# Patient Record
Sex: Male | Born: 1960 | Race: White | Hispanic: No | Marital: Married | Smoking: Former smoker
Health system: Southern US, Community
[De-identification: ages and names within clinical notes are randomized; demographics above are authoritative.]

## PROBLEM LIST (undated history)

## (undated) DIAGNOSIS — F200 Paranoid schizophrenia: Secondary | ICD-10-CM

## (undated) DIAGNOSIS — M1990 Unspecified osteoarthritis, unspecified site: Secondary | ICD-10-CM

## (undated) DIAGNOSIS — G4730 Sleep apnea, unspecified: Secondary | ICD-10-CM

## (undated) DIAGNOSIS — F1021 Alcohol dependence, in remission: Secondary | ICD-10-CM

## (undated) DIAGNOSIS — F32A Depression, unspecified: Secondary | ICD-10-CM

## (undated) DIAGNOSIS — S72401A Unspecified fracture of lower end of right femur, initial encounter for closed fracture: Secondary | ICD-10-CM

## (undated) DIAGNOSIS — F419 Anxiety disorder, unspecified: Secondary | ICD-10-CM

## (undated) DIAGNOSIS — N400 Enlarged prostate without lower urinary tract symptoms: Secondary | ICD-10-CM

## (undated) DIAGNOSIS — F329 Major depressive disorder, single episode, unspecified: Secondary | ICD-10-CM

## (undated) DIAGNOSIS — B1920 Unspecified viral hepatitis C without hepatic coma: Secondary | ICD-10-CM

## (undated) DIAGNOSIS — G8929 Other chronic pain: Secondary | ICD-10-CM

## (undated) HISTORY — DX: Sleep apnea, unspecified: G47.30

## (undated) HISTORY — DX: Depression, unspecified: F32.A

## (undated) HISTORY — DX: Paranoid schizophrenia: F20.0

## (undated) HISTORY — DX: Anxiety disorder, unspecified: F41.9

## (undated) HISTORY — PX: JOINT REPLACEMENT: SHX530

## (undated) HISTORY — PX: FRACTURE SURGERY: SHX138

## (undated) HISTORY — DX: Unspecified osteoarthritis, unspecified site: M19.90

## (undated) HISTORY — DX: Unspecified viral hepatitis C without hepatic coma: B19.20

---
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
## 2005-07-17 ENCOUNTER — Emergency Department: Payer: Self-pay | Admitting: Emergency Medicine

## 2009-12-15 ENCOUNTER — Inpatient Hospital Stay: Payer: Self-pay | Admitting: Internal Medicine

## 2010-03-29 ENCOUNTER — Emergency Department: Payer: Self-pay | Admitting: Emergency Medicine

## 2010-08-13 ENCOUNTER — Inpatient Hospital Stay: Payer: Self-pay | Admitting: Unknown Physician Specialty

## 2018-06-09 ENCOUNTER — Other Ambulatory Visit: Payer: Self-pay

## 2018-06-09 ENCOUNTER — Encounter (HOSPITAL_COMMUNITY): Payer: Self-pay | Admitting: Emergency Medicine

## 2018-06-09 ENCOUNTER — Emergency Department (HOSPITAL_COMMUNITY): Payer: Medicaid Other

## 2018-06-09 ENCOUNTER — Inpatient Hospital Stay (HOSPITAL_COMMUNITY): Payer: Medicaid Other

## 2018-06-09 ENCOUNTER — Inpatient Hospital Stay (HOSPITAL_COMMUNITY)
Admission: EM | Admit: 2018-06-09 | Discharge: 2018-06-12 | DRG: 481 | Disposition: A | Discharge status: Rehabilitation Facility | Payer: Medicaid Other | Attending: General Surgery | Admitting: General Surgery

## 2018-06-09 DIAGNOSIS — F17210 Nicotine dependence, cigarettes, uncomplicated: Secondary | ICD-10-CM | POA: Diagnosis present

## 2018-06-09 DIAGNOSIS — Z1159 Encounter for screening for other viral diseases: Secondary | ICD-10-CM | POA: Diagnosis not present

## 2018-06-09 DIAGNOSIS — N400 Enlarged prostate without lower urinary tract symptoms: Secondary | ICD-10-CM | POA: Diagnosis present

## 2018-06-09 DIAGNOSIS — S060X9A Concussion with loss of consciousness of unspecified duration, initial encounter: Secondary | ICD-10-CM | POA: Diagnosis present

## 2018-06-09 DIAGNOSIS — R402143 Coma scale, eyes open, spontaneous, at hospital admission: Secondary | ICD-10-CM | POA: Diagnosis present

## 2018-06-09 DIAGNOSIS — D62 Acute posthemorrhagic anemia: Secondary | ICD-10-CM | POA: Diagnosis not present

## 2018-06-09 DIAGNOSIS — S2232XA Fracture of one rib, left side, initial encounter for closed fracture: Secondary | ICD-10-CM | POA: Diagnosis present

## 2018-06-09 DIAGNOSIS — G8929 Other chronic pain: Secondary | ICD-10-CM | POA: Diagnosis present

## 2018-06-09 DIAGNOSIS — S12191A Other nondisplaced fracture of second cervical vertebra, initial encounter for closed fracture: Secondary | ICD-10-CM | POA: Diagnosis present

## 2018-06-09 DIAGNOSIS — R402243 Coma scale, best verbal response, confused conversation, at hospital admission: Secondary | ICD-10-CM | POA: Diagnosis present

## 2018-06-09 DIAGNOSIS — R402363 Coma scale, best motor response, obeys commands, at hospital admission: Secondary | ICD-10-CM | POA: Diagnosis present

## 2018-06-09 DIAGNOSIS — S72461A Displaced supracondylar fracture with intracondylar extension of lower end of right femur, initial encounter for closed fracture: Secondary | ICD-10-CM | POA: Diagnosis present

## 2018-06-09 DIAGNOSIS — Z79899 Other long term (current) drug therapy: Secondary | ICD-10-CM

## 2018-06-09 DIAGNOSIS — Z419 Encounter for procedure for purposes other than remedying health state, unspecified: Secondary | ICD-10-CM

## 2018-06-09 DIAGNOSIS — R413 Other amnesia: Secondary | ICD-10-CM | POA: Diagnosis present

## 2018-06-09 DIAGNOSIS — S0101XA Laceration without foreign body of scalp, initial encounter: Secondary | ICD-10-CM

## 2018-06-09 DIAGNOSIS — Z791 Long term (current) use of non-steroidal anti-inflammatories (NSAID): Secondary | ICD-10-CM

## 2018-06-09 DIAGNOSIS — S72401A Unspecified fracture of lower end of right femur, initial encounter for closed fracture: Secondary | ICD-10-CM | POA: Diagnosis present

## 2018-06-09 DIAGNOSIS — S7290XA Unspecified fracture of unspecified femur, initial encounter for closed fracture: Secondary | ICD-10-CM

## 2018-06-09 HISTORY — DX: Unspecified fracture of lower end of right femur, initial encounter for closed fracture: S72.401A

## 2018-06-09 HISTORY — DX: Benign prostatic hyperplasia without lower urinary tract symptoms: N40.0

## 2018-06-09 HISTORY — DX: Other chronic pain: G89.29

## 2018-06-09 LAB — COMPREHENSIVE METABOLIC PANEL
ALT: 36 U/L (ref 0–44)
AST: 41 U/L (ref 15–41)
Albumin: 3.9 g/dL (ref 3.5–5.0)
Alkaline Phosphatase: 74 U/L (ref 38–126)
Anion gap: 11 (ref 5–15)
BUN: 8 mg/dL (ref 6–20)
CO2: 24 mmol/L (ref 22–32)
Calcium: 9.3 mg/dL (ref 8.9–10.3)
Chloride: 102 mmol/L (ref 98–111)
Creatinine, Ser: 0.74 mg/dL (ref 0.61–1.24)
GFR calc Af Amer: >60 mL/min (ref >60)
GFR calc non Af Amer: >60 mL/min (ref >60)
Glucose, Bld: 128 mg/dL — ABNORMAL HIGH (ref 70–99)
Potassium: 3.9 mmol/L (ref 3.5–5.1)
Sodium: 137 mmol/L (ref 135–145)
Total Bilirubin: 0.8 mg/dL (ref 0.3–1.2)
Total Protein: 7 g/dL (ref 6.5–8.1)

## 2018-06-09 LAB — CBC WITH DIFFERENTIAL/PLATELET
Abs Immature Granulocytes: 0.09 10*3/uL — ABNORMAL HIGH (ref 0.00–0.07)
Basophils Absolute: 0.1 10*3/uL (ref 0.0–0.1)
Basophils Relative: 1 %
Eosinophils Absolute: 0.5 10*3/uL (ref 0.0–0.5)
Eosinophils Relative: 4 %
HCT: 41.3 % (ref 39.0–52.0)
Hemoglobin: 13.9 g/dL (ref 13.0–17.0)
Immature Granulocytes: 1 %
Lymphocytes Relative: 17 %
Lymphs Abs: 2.1 10*3/uL (ref 0.7–4.0)
MCH: 32 pg (ref 26.0–34.0)
MCHC: 33.7 g/dL (ref 30.0–36.0)
MCV: 94.9 fL (ref 80.0–100.0)
Monocytes Absolute: 0.9 10*3/uL (ref 0.1–1.0)
Monocytes Relative: 7 %
Neutro Abs: 8.2 10*3/uL — ABNORMAL HIGH (ref 1.7–7.7)
Neutrophils Relative %: 70 %
Platelets: 245 10*3/uL (ref 150–400)
RBC: 4.35 MIL/uL (ref 4.22–5.81)
RDW: 12.9 % (ref 11.5–15.5)
WBC: 11.9 10*3/uL — ABNORMAL HIGH (ref 4.0–10.5)
nRBC: 0 % (ref 0.0–0.2)

## 2018-06-09 LAB — PROTIME-INR
INR: 0.9 (ref 0.8–1.2)
Prothrombin Time: 12.5 seconds (ref 11.4–15.2)

## 2018-06-09 LAB — SARS CORONAVIRUS 2 BY RT PCR (HOSPITAL ORDER, PERFORMED IN ~~LOC~~ HOSPITAL LAB): SARS Coronavirus 2: NEGATIVE

## 2018-06-09 LAB — ETHANOL: Alcohol, Ethyl (B): <10 mg/dL (ref <10)

## 2018-06-09 LAB — MRSA PCR SCREENING: MRSA by PCR: NEGATIVE

## 2018-06-09 MED ORDER — LIDOCAINE-EPINEPHRINE (PF) 2 %-1:200000 IJ SOLN
20.0000 mL | Freq: Once | INTRAMUSCULAR | Status: AC
  Administered 2018-06-09: 20 mL via INTRADERMAL
  Filled 2018-06-09: qty 20

## 2018-06-09 MED ORDER — OXYCODONE HCL 5 MG PO TABS: 5.0000 mg | ORAL_TABLET | ORAL | Status: DC | PRN

## 2018-06-09 MED ORDER — PANTOPRAZOLE SODIUM 40 MG IV SOLR: 40.0000 mg | Freq: Every day | INTRAVENOUS | Status: DC

## 2018-06-09 MED ORDER — ACETAMINOPHEN 325 MG PO TABS
650.0000 mg | ORAL_TABLET | ORAL | Status: DC | PRN
  Administered 2018-06-09: 650 mg via ORAL
  Filled 2018-06-09: qty 2

## 2018-06-09 MED ORDER — SODIUM CHLORIDE 0.9 % IV SOLN
INTRAVENOUS | Status: DC
  Administered 2018-06-09 – 2018-06-11 (×4): via INTRAVENOUS

## 2018-06-09 MED ORDER — ONDANSETRON 4 MG PO TBDP: 4.0000 mg | ORAL_TABLET | Freq: Four times a day (QID) | ORAL | Status: DC | PRN

## 2018-06-09 MED ORDER — DOCUSATE SODIUM 100 MG PO CAPS
100.0000 mg | ORAL_CAPSULE | Freq: Two times a day (BID) | ORAL | Status: DC
  Administered 2018-06-09 – 2018-06-12 (×6): 100 mg via ORAL
  Filled 2018-06-09 (×6): qty 1

## 2018-06-09 MED ORDER — SODIUM CHLORIDE 0.9 % IV SOLN
INTRAVENOUS | Status: DC
  Administered 2018-06-09 (×2): via INTRAVENOUS

## 2018-06-09 MED ORDER — OXYCODONE HCL 5 MG PO TABS
10.0000 mg | ORAL_TABLET | ORAL | Status: DC | PRN
  Administered 2018-06-09 (×2): 10 mg via ORAL
  Filled 2018-06-09 (×2): qty 2

## 2018-06-09 MED ORDER — HYDRALAZINE HCL 20 MG/ML IJ SOLN: 10.0000 mg | INTRAMUSCULAR | Status: DC | PRN

## 2018-06-09 MED ORDER — HYDROMORPHONE HCL 1 MG/ML IJ SOLN
1.0000 mg | INTRAMUSCULAR | Status: DC | PRN
  Administered 2018-06-09 (×3): 1 mg via INTRAVENOUS
  Filled 2018-06-09 (×3): qty 1

## 2018-06-09 MED ORDER — LIDOCAINE-EPINEPHRINE (PF) 2 %-1:200000 IJ SOLN: 10.0000 mL | Freq: Once | INTRAMUSCULAR | Status: DC

## 2018-06-09 MED ORDER — PANTOPRAZOLE SODIUM 40 MG PO TBEC
40.0000 mg | DELAYED_RELEASE_TABLET | Freq: Every day | ORAL | Status: DC
  Administered 2018-06-09 – 2018-06-12 (×3): 40 mg via ORAL
  Filled 2018-06-09 (×3): qty 1

## 2018-06-09 MED ORDER — FENTANYL CITRATE (PF) 100 MCG/2ML IJ SOLN
25.0000 ug | Freq: Once | INTRAMUSCULAR | Status: AC
  Administered 2018-06-09: 25 ug via INTRAVENOUS
  Filled 2018-06-09: qty 2

## 2018-06-09 MED ORDER — ONDANSETRON HCL 4 MG/2ML IJ SOLN: 4.0000 mg | Freq: Four times a day (QID) | INTRAMUSCULAR | Status: DC | PRN

## 2018-06-09 MED ORDER — IOHEXOL 300 MG/ML  SOLN
100.0000 mL | Freq: Once | INTRAMUSCULAR | Status: AC | PRN
  Administered 2018-06-09: 100 mL via INTRAVENOUS

## 2018-06-09 MED ORDER — TETANUS-DIPHTH-ACELL PERTUSSIS 5-2.5-18.5 LF-MCG/0.5 IM SUSP
0.5000 mL | Freq: Once | INTRAMUSCULAR | Status: AC
  Administered 2018-06-09: 0.5 mL via INTRAMUSCULAR
  Filled 2018-06-09: qty 0.5

## 2018-06-09 NOTE — ED Provider Notes (Signed)
..  Laceration Repair Date/Time: 06/09/2018 10:19 AM Performed by: Trinidad Curet, MD Authorized by: Trinidad Curet, MD   Consent:    Consent obtained:  Verbal Laceration details:    Location:  Scalp   Scalp location:  Occipital   Length (cm):  5 Repair type:    Repair type:  Intermediate Pre-procedure details:    Preparation:  Patient was prepped and draped in usual sterile fashion Exploration:    Hemostasis achieved with:  Direct pressure   Wound exploration: entire depth of wound probed and visualized     Contaminated: no   Treatment:    Area cleansed with:  Saline   Amount of cleaning:  Standard   Irrigation solution:  Sterile saline   Irrigation volume:  300cc   Visualized foreign bodies/material removed: no   Skin repair:    Repair method:  Staples   Number of staples:  9 Approximation:    Approximation:  Close Post-procedure details:    Patient tolerance of procedure:  Tolerated well, no immediate complications  .Marland KitchenLaceration Repair Date/Time: 06/09/2018 10:28 AM Performed by: Trinidad Curet, MD Authorized by: Trinidad Curet, MD   Consent:    Consent obtained:  Verbal Laceration details:    Location:  Scalp   Scalp location:  Crown   Length (cm):  1.5 Repair type:    Repair type:  Simple Pre-procedure details:    Preparation:  Patient was prepped and draped in usual sterile fashion Exploration:    Hemostasis achieved with:  Direct pressure   Contaminated: no   Treatment:    Area cleansed with:  Saline   Amount of cleaning:  Standard   Irrigation solution:  Sterile saline   Irrigation volume:  100 Skin repair:    Repair method:  Staples   Number of staples:  1 Approximation:    Approximation:  Close Post-procedure details:    Patient tolerance of procedure:  Tolerated well, no immediate complications .Marland KitchenLaceration Repair Date/Time: 06/09/2018 10:29 AM Performed by: Trinidad Curet, MD Authorized by: Trinidad Curet, MD   Consent:    Consent obtained:   Verbal   Consent given by:  Patient Anesthesia (see MAR for exact dosages):    Anesthesia method:  Local infiltration   Local anesthetic:  Lidocaine 2% WITH epi Laceration details:    Location:  Scalp   Scalp location:  Crown   Length (cm):  10 Repair type:    Repair type:  Complex Pre-procedure details:    Preparation:  Patient was prepped and draped in usual sterile fashion Exploration:    Contaminated: no   Treatment:    Area cleansed with:  Saline   Amount of cleaning:  Extensive   Irrigation solution:  Sterile saline   Irrigation volume:  300cc   Visualized foreign bodies/material removed: no     Debridement:  None   Undermining:  None Skin repair:    Repair method:  Staples   Number of staples:  13 Approximation:    Approximation:  Close Post-procedure details:    Patient tolerance of procedure:  Tolerated well, no immediate complications      Trinidad Curet, MD 06/09/18 1033    Carmin Muskrat, MD 06/13/18 [phone]
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
## 2018-06-09 NOTE — ED Notes (Signed)
Attempted report x1. 
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
## 2018-06-09 NOTE — ED Notes (Signed)
Pt agitated and uncooperative. Pt not tolerating c-collar and continues to remove it despite repetitive redirection and education. Provider notified. Will continue to monitor.

## 2018-06-09 NOTE — ED Notes (Signed)
Help get patient cleaned up patient is now going to CT
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
## 2018-06-09 NOTE — Progress Notes (Signed)
   06/09/18 2323  Clinical Encounter Type  Visited With Patient;Health care provider  Visit Type Initial;Spiritual support;Pre-op  Referral From Nurse  Consult/Referral To [place] text  This chaplain responded to RN-Barry request for Pt. Spiritual care.  Upon arrival, the RN introduced the chaplain to the Pt. The Pt. received the gift of a Bible and reading glasses.  Afterwards, the chaplain and Pt. prayed together. The RN communicated the Pt. desires for post-op prayer on Wednesday.  The chaplain will provide F/U spiritual care as needed.
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
## 2018-06-09 NOTE — ED Notes (Signed)
Pt asked me to call his friend Pearson Forster to notify him that he is in the hospital. Also called patients friend, Kennieth Rad, and updated of same. Plan to be admitted to the hospital.
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
## 2018-06-09 NOTE — ED Notes (Signed)
Neuro at bedside.
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
## 2018-06-09 NOTE — Progress Notes (Signed)
Orthopedic Tech Progress Note Patient Details:  Richard Vasquez 1960/06/05 [id number]  Ortho Devices Type of Ortho Device: Knee Immobilizer Ortho Device/Splint Location: right Ortho Device/Splint Interventions: Application   Post Interventions Patient Tolerated: Well Instructions Provided: Care of device   [name] 06/09/2018, 9:32 AM
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
## 2018-06-10 ENCOUNTER — Inpatient Hospital Stay (HOSPITAL_COMMUNITY): Payer: Medicaid Other

## 2018-06-10 ENCOUNTER — Inpatient Hospital Stay (HOSPITAL_COMMUNITY): Payer: Medicaid Other | Admitting: Certified Registered Nurse Anesthetist

## 2018-06-10 ENCOUNTER — Encounter (HOSPITAL_COMMUNITY): Payer: Self-pay | Admitting: Certified Registered Nurse Anesthetist

## 2018-06-10 ENCOUNTER — Encounter (HOSPITAL_COMMUNITY): Admission: EM | Disposition: A | Discharge status: Rehabilitation Facility | Payer: Self-pay | Source: Home / Self Care

## 2018-06-10 DIAGNOSIS — S72461A Displaced supracondylar fracture with intracondylar extension of lower end of right femur, initial encounter for closed fracture: Secondary | ICD-10-CM

## 2018-06-10 HISTORY — DX: Displaced supracondylar fracture with intracondylar extension of lower end of right femur, initial encounter for closed fracture: S72.461A

## 2018-06-10 HISTORY — PX: ORIF FEMUR FRACTURE: SHX2119

## 2018-06-10 LAB — BASIC METABOLIC PANEL
Anion gap: 8 (ref 5–15)
BUN: 8 mg/dL (ref 6–20)
CO2: 24 mmol/L (ref 22–32)
Calcium: 9 mg/dL (ref 8.9–10.3)
Chloride: 104 mmol/L (ref 98–111)
Creatinine, Ser: 0.58 mg/dL — ABNORMAL LOW (ref 0.61–1.24)
GFR calc Af Amer: >60 mL/min (ref >60)
GFR calc non Af Amer: >60 mL/min (ref >60)
Glucose, Bld: 108 mg/dL — ABNORMAL HIGH (ref 70–99)
Potassium: 3.9 mmol/L (ref 3.5–5.1)
Sodium: 136 mmol/L (ref 135–145)

## 2018-06-10 LAB — CBC
HCT: 35 % — ABNORMAL LOW (ref 39.0–52.0)
Hemoglobin: 11.9 g/dL — ABNORMAL LOW (ref 13.0–17.0)
MCH: 31.7 pg (ref 26.0–34.0)
MCHC: 34 g/dL (ref 30.0–36.0)
MCV: 93.3 fL (ref 80.0–100.0)
Platelets: 201 10*3/uL (ref 150–400)
RBC: 3.75 MIL/uL — ABNORMAL LOW (ref 4.22–5.81)
RDW: 12.9 % (ref 11.5–15.5)
WBC: 14.5 10*3/uL — ABNORMAL HIGH (ref 4.0–10.5)
nRBC: 0 % (ref 0.0–0.2)

## 2018-06-10 LAB — HIV ANTIBODY (ROUTINE TESTING W REFLEX): HIV Screen 4th Generation wRfx: NONREACTIVE

## 2018-06-10 SURGERY — OPEN REDUCTION INTERNAL FIXATION (ORIF) DISTAL FEMUR FRACTURE
Anesthesia: General | Site: Leg Upper | Laterality: Right

## 2018-06-10 MED ORDER — LORAZEPAM 2 MG/ML IJ SOLN
2.0000 mg | Freq: Once | INTRAMUSCULAR | Status: AC
  Administered 2018-06-10: 03:00:00 2 mg via INTRAVENOUS
  Filled 2018-06-10: qty 1

## 2018-06-10 MED ORDER — DEXAMETHASONE SODIUM PHOSPHATE 10 MG/ML IJ SOLN
INTRAMUSCULAR | Status: DC | PRN
  Administered 2018-06-10: 10 mg via INTRAVENOUS

## 2018-06-10 MED ORDER — LACTATED RINGERS IV SOLN
INTRAVENOUS | Status: DC
  Administered 2018-06-10 (×2): via INTRAVENOUS

## 2018-06-10 MED ORDER — SUGAMMADEX SODIUM 200 MG/2ML IV SOLN
INTRAVENOUS | Status: DC | PRN
  Administered 2018-06-10: 200 mg via INTRAVENOUS

## 2018-06-10 MED ORDER — VANCOMYCIN HCL 1000 MG IV SOLR
INTRAVENOUS | Status: AC
  Filled 2018-06-10: qty 1000

## 2018-06-10 MED ORDER — ONDANSETRON HCL 4 MG/2ML IJ SOLN
INTRAMUSCULAR | Status: AC
  Filled 2018-06-10: qty 2

## 2018-06-10 MED ORDER — PHENYLEPHRINE 40 MCG/ML (10ML) SYRINGE FOR IV PUSH (FOR BLOOD PRESSURE SUPPORT)
PREFILLED_SYRINGE | INTRAVENOUS | Status: DC | PRN
  Administered 2018-06-10 (×3): 80 ug via INTRAVENOUS
  Administered 2018-06-10: 120 ug via INTRAVENOUS
  Administered 2018-06-10 (×3): 80 ug via INTRAVENOUS

## 2018-06-10 MED ORDER — ONDANSETRON 4 MG PO TBDP: 4.0000 mg | ORAL_TABLET | Freq: Four times a day (QID) | ORAL | Status: DC | PRN

## 2018-06-10 MED ORDER — OXYCODONE HCL 5 MG PO TABS
5.0000 mg | ORAL_TABLET | ORAL | Status: DC | PRN
  Administered 2018-06-10 – 2018-06-12 (×9): 10 mg via ORAL
  Filled 2018-06-10 (×9): qty 2

## 2018-06-10 MED ORDER — HYDROXYZINE HCL 25 MG PO TABS
25.0000 mg | ORAL_TABLET | Freq: Four times a day (QID) | ORAL | Status: DC | PRN
  Administered 2018-06-12: 25 mg via ORAL
  Filled 2018-06-10: qty 1

## 2018-06-10 MED ORDER — METHOCARBAMOL 500 MG PO TABS
500.0000 mg | ORAL_TABLET | Freq: Four times a day (QID) | ORAL | Status: DC | PRN
  Administered 2018-06-11 – 2018-06-12 (×2): 500 mg via ORAL
  Filled 2018-06-10 (×2): qty 1

## 2018-06-10 MED ORDER — MEPERIDINE HCL 25 MG/ML IJ SOLN: 6.2500 mg | INTRAMUSCULAR | Status: DC | PRN

## 2018-06-10 MED ORDER — LIDOCAINE 2% (20 MG/ML) 5 ML SYRINGE
INTRAMUSCULAR | Status: DC | PRN
  Administered 2018-06-10: 20 mg via INTRAVENOUS

## 2018-06-10 MED ORDER — CEFAZOLIN SODIUM-DEXTROSE 2-4 GM/100ML-% IV SOLN
INTRAVENOUS | Status: AC
  Filled 2018-06-10: qty 100

## 2018-06-10 MED ORDER — PROPOFOL 10 MG/ML IV BOLUS
INTRAVENOUS | Status: DC | PRN
  Administered 2018-06-10: 120 mg via INTRAVENOUS

## 2018-06-10 MED ORDER — HYDROMORPHONE HCL 1 MG/ML IJ SOLN
1.0000 mg | INTRAMUSCULAR | Status: DC | PRN
  Administered 2018-06-11 (×2): 1 mg via INTRAVENOUS
  Filled 2018-06-10 (×2): qty 1

## 2018-06-10 MED ORDER — ENOXAPARIN SODIUM 40 MG/0.4ML ~~LOC~~ SOLN
40.0000 mg | SUBCUTANEOUS | Status: DC
  Administered 2018-06-11 – 2018-06-12 (×2): 40 mg via SUBCUTANEOUS
  Filled 2018-06-10 (×2): qty 0.4

## 2018-06-10 MED ORDER — CEFAZOLIN SODIUM-DEXTROSE 2-4 GM/100ML-% IV SOLN
2.0000 g | INTRAVENOUS | Status: AC
  Administered 2018-06-10: 11:00:00 2 g via INTRAVENOUS

## 2018-06-10 MED ORDER — VANCOMYCIN HCL 1000 MG IV SOLR
INTRAVENOUS | Status: DC | PRN
  Administered 2018-06-10: 1000 mg via TOPICAL

## 2018-06-10 MED ORDER — HYDROMORPHONE HCL 1 MG/ML IJ SOLN: 0.2500 mg | INTRAMUSCULAR | Status: DC | PRN

## 2018-06-10 MED ORDER — DEXAMETHASONE SODIUM PHOSPHATE 10 MG/ML IJ SOLN
INTRAMUSCULAR | Status: AC
  Filled 2018-06-10: qty 1

## 2018-06-10 MED ORDER — MIDAZOLAM HCL 2 MG/2ML IJ SOLN: 0.5000 mg | Freq: Once | INTRAMUSCULAR | Status: DC | PRN

## 2018-06-10 MED ORDER — ONDANSETRON HCL 4 MG/2ML IJ SOLN
INTRAMUSCULAR | Status: DC | PRN
  Administered 2018-06-10: 4 mg via INTRAVENOUS

## 2018-06-10 MED ORDER — FENTANYL CITRATE (PF) 100 MCG/2ML IJ SOLN
INTRAMUSCULAR | Status: DC | PRN
  Administered 2018-06-10 (×2): 50 ug via INTRAVENOUS

## 2018-06-10 MED ORDER — THIAMINE HCL 100 MG/ML IJ SOLN
100.0000 mg | Freq: Once | INTRAMUSCULAR | Status: AC
  Administered 2018-06-10: 100 mg via INTRAMUSCULAR
  Filled 2018-06-10: qty 2

## 2018-06-10 MED ORDER — PROMETHAZINE HCL 25 MG/ML IJ SOLN: 6.2500 mg | INTRAMUSCULAR | Status: DC | PRN

## 2018-06-10 MED ORDER — LORAZEPAM 1 MG PO TABS: 1.0000 mg | ORAL_TABLET | Freq: Four times a day (QID) | ORAL | Status: DC | PRN

## 2018-06-10 MED ORDER — LORAZEPAM 2 MG/ML IJ SOLN
2.0000 mg | Freq: Once | INTRAMUSCULAR | Status: AC
  Administered 2018-06-10: 08:00:00 2 mg via INTRAVENOUS
  Filled 2018-06-10: qty 1

## 2018-06-10 MED ORDER — ADULT MULTIVITAMIN W/MINERALS CH
1.0000 | ORAL_TABLET | Freq: Every day | ORAL | Status: DC
  Administered 2018-06-11 – 2018-06-12 (×2): 1 via ORAL
  Filled 2018-06-10 (×2): qty 1

## 2018-06-10 MED ORDER — ROCURONIUM BROMIDE 50 MG/5ML IV SOSY
PREFILLED_SYRINGE | INTRAVENOUS | Status: DC | PRN
  Administered 2018-06-10: 20 mg via INTRAVENOUS
  Administered 2018-06-10: 50 mg via INTRAVENOUS
  Administered 2018-06-10: 20 mg via INTRAVENOUS

## 2018-06-10 MED ORDER — LIDOCAINE 2% (20 MG/ML) 5 ML SYRINGE
INTRAMUSCULAR | Status: AC
  Filled 2018-06-10: qty 5

## 2018-06-10 MED ORDER — CEFAZOLIN SODIUM-DEXTROSE 2-4 GM/100ML-% IV SOLN
2.0000 g | Freq: Three times a day (TID) | INTRAVENOUS | Status: AC
  Administered 2018-06-10 – 2018-06-11 (×3): 2 g via INTRAVENOUS
  Filled 2018-06-10 (×3): qty 100

## 2018-06-10 MED ORDER — BACITRACIN ZINC 500 UNIT/GM EX OINT
TOPICAL_OINTMENT | CUTANEOUS | Status: AC
  Filled 2018-06-10: qty 28.35

## 2018-06-10 MED ORDER — SUCCINYLCHOLINE CHLORIDE 200 MG/10ML IV SOSY
PREFILLED_SYRINGE | INTRAVENOUS | Status: DC | PRN
  Administered 2018-06-10: 100 mg via INTRAVENOUS

## 2018-06-10 MED ORDER — 0.9 % SODIUM CHLORIDE (POUR BTL) OPTIME
TOPICAL | Status: DC | PRN
  Administered 2018-06-10: 12:00:00 1000 mL

## 2018-06-10 MED ORDER — LOPERAMIDE HCL 2 MG PO CAPS: 2.0000 mg | ORAL_CAPSULE | ORAL | Status: DC | PRN

## 2018-06-10 MED ORDER — POVIDONE-IODINE 10 % EX SWAB: 2.0000 "application " | Freq: Once | CUTANEOUS | Status: DC

## 2018-06-10 MED ORDER — VITAMIN B-1 100 MG PO TABS
100.0000 mg | ORAL_TABLET | Freq: Every day | ORAL | Status: DC
  Administered 2018-06-11 – 2018-06-12 (×2): 100 mg via ORAL
  Filled 2018-06-10 (×2): qty 1

## 2018-06-10 MED ORDER — HALOPERIDOL LACTATE 5 MG/ML IJ SOLN
5.0000 mg | Freq: Once | INTRAMUSCULAR | Status: AC
  Administered 2018-06-10: 5 mg via INTRAVENOUS
  Filled 2018-06-10: qty 1

## 2018-06-10 MED ORDER — CHLORHEXIDINE GLUCONATE 4 % EX LIQD: 60.0000 mL | Freq: Once | CUTANEOUS | Status: DC

## 2018-06-10 MED ORDER — FENTANYL CITRATE (PF) 250 MCG/5ML IJ SOLN
INTRAMUSCULAR | Status: AC
  Filled 2018-06-10: qty 5

## 2018-06-10 MED ORDER — ACETAMINOPHEN 325 MG PO TABS
650.0000 mg | ORAL_TABLET | Freq: Four times a day (QID) | ORAL | Status: DC
  Administered 2018-06-10 – 2018-06-12 (×8): 650 mg via ORAL
  Filled 2018-06-10 (×8): qty 2

## 2018-06-10 MED ORDER — GABAPENTIN 100 MG PO CAPS
100.0000 mg | ORAL_CAPSULE | Freq: Three times a day (TID) | ORAL | Status: DC
  Administered 2018-06-10 – 2018-06-12 (×6): 100 mg via ORAL
  Filled 2018-06-10 (×6): qty 1

## 2018-06-10 SURGICAL SUPPLY — 76 items
BIT DRILL 2.5 X LONG (BIT) ×1
BIT DRILL CALIBRATED 3.2MM (DRILL) ×1 IMPLANT
BIT DRILL CANN QC 4.3X180 (BIT) ×1 IMPLANT
BIT DRILL GUIDEWIRE 2.5X200 (WIRE) ×3 IMPLANT
BIT DRILL Q/COUPLING 1 (BIT) ×3 IMPLANT
BIT DRILL QC 3.5X110 (BIT) ×3 IMPLANT
BIT DRILL X LONG 2.5 (BIT) ×1 IMPLANT
BLADE CLIPPER SURG (BLADE) IMPLANT
BNDG COHESIVE 6X5 TAN STRL LF (GAUZE/BANDAGES/DRESSINGS) IMPLANT
BNDG ELASTIC 6X10 VLCR STRL LF (GAUZE/BANDAGES/DRESSINGS) ×3 IMPLANT
BRUSH SCRUB SURG 4.25 DISP (MISCELLANEOUS) ×3 IMPLANT
CANISTER SUCT 3000ML PPV (MISCELLANEOUS) ×3 IMPLANT
CHLORAPREP W/TINT 26ML (MISCELLANEOUS) ×6 IMPLANT
COVER SURGICAL LIGHT HANDLE (MISCELLANEOUS) IMPLANT
COVER WAND RF STERILE (DRAPES) IMPLANT
DRAPE C-ARM 42X72 X-RAY (DRAPES) ×3 IMPLANT
DRAPE C-ARMOR (DRAPES) ×3 IMPLANT
DRAPE ORTHO SPLIT 77X108 STRL (DRAPES)
DRAPE PROXIMA HALF (DRAPES) ×6 IMPLANT
DRAPE SURG 17X23 STRL (DRAPES) IMPLANT
DRAPE SURG ORHT 6 SPLT 77X108 (DRAPES) IMPLANT
DRAPE U-SHAPE 47X51 STRL (DRAPES) ×3 IMPLANT
DRILL BIT 4.3MM (BIT) ×3
DRILL BIT X LONG 2.5 (BIT) ×2
DRILL CALIBRATED 3.2MM (DRILL) ×3
DRSG ADAPTIC 3X8 NADH LF (GAUZE/BANDAGES/DRESSINGS) IMPLANT
DRSG MEPILEX BORDER 4X12 (GAUZE/BANDAGES/DRESSINGS) IMPLANT
DRSG MEPILEX BORDER 4X4 (GAUZE/BANDAGES/DRESSINGS) ×3 IMPLANT
DRSG MEPILEX BORDER 4X8 (GAUZE/BANDAGES/DRESSINGS) ×3 IMPLANT
DRSG PAD ABDOMINAL 8X10 ST (GAUZE/BANDAGES/DRESSINGS) IMPLANT
ELECT REM PT RETURN 9FT ADLT (ELECTROSURGICAL) ×3
ELECTRODE REM PT RTRN 9FT ADLT (ELECTROSURGICAL) ×1 IMPLANT
GAUZE SPONGE 4X4 12PLY STRL (GAUZE/BANDAGES/DRESSINGS) IMPLANT
GLOVE BIO SURGEON STRL SZ 6.5 (GLOVE) ×6 IMPLANT
GLOVE BIO SURGEON STRL SZ7.5 (GLOVE) ×12 IMPLANT
GLOVE BIO SURGEONS STRL SZ 6.5 (GLOVE) ×3
GLOVE BIOGEL PI IND STRL 6.5 (GLOVE) ×1 IMPLANT
GLOVE BIOGEL PI IND STRL 7.0 (GLOVE) ×1 IMPLANT
GLOVE BIOGEL PI IND STRL 7.5 (GLOVE) ×1 IMPLANT
GLOVE BIOGEL PI INDICATOR 6.5 (GLOVE) ×2
GLOVE BIOGEL PI INDICATOR 7.0 (GLOVE) ×2
GLOVE BIOGEL PI INDICATOR 7.5 (GLOVE) ×2
GLOVE SURG SS PI 6.5 STRL IVOR (GLOVE) ×9 IMPLANT
GOWN STRL REUS W/ TWL LRG LVL3 (GOWN DISPOSABLE) ×2 IMPLANT
GOWN STRL REUS W/TWL LRG LVL3 (GOWN DISPOSABLE) ×4
KIT BASIN OR (CUSTOM PROCEDURE TRAY) ×3 IMPLANT
KIT TURNOVER KIT B (KITS) ×3 IMPLANT
NS IRRIG 1000ML POUR BTL (IV SOLUTION) ×3 IMPLANT
PACK TOTAL JOINT (CUSTOM PROCEDURE TRAY) ×3 IMPLANT
PAD ARMBOARD 7.5X6 YLW CONV (MISCELLANEOUS) ×6 IMPLANT
PAD CAST 4YDX4 CTTN HI CHSV (CAST SUPPLIES) IMPLANT
PADDING CAST COTTON 4X4 STRL (CAST SUPPLIES)
PADDING CAST COTTON 6X4 STRL (CAST SUPPLIES) ×3 IMPLANT
PLATE VA-LCP CONDYLAR 4.5 6H (Plate) ×3 IMPLANT
SCREW CORTEX ST 4.5X36 (Screw) ×3 IMPLANT
SCREW CORTEX ST 4.5X46 (Screw) ×3 IMPLANT
SCREW CORTEX ST 4.5X52 (Screw) ×3 IMPLANT
SCREW HEADED ST 3.5X70 (Screw) ×3 IMPLANT
SCREW HEADED ST 3.5X85 (Screw) ×3 IMPLANT
SCREW LOCK 5.0X80 (Screw) ×6 IMPLANT
SCREW LOCKING VA 5.0X46MM (Screw) ×3 IMPLANT
SCREW LOCKING VA 5.0X90MM (Screw) ×6 IMPLANT
SPONGE LAP 18X18 RF (DISPOSABLE) IMPLANT
STAPLER VISISTAT 35W (STAPLE) ×3 IMPLANT
SUCTION FRAZIER HANDLE 10FR (MISCELLANEOUS) ×2
SUCTION TUBE FRAZIER 10FR DISP (MISCELLANEOUS) ×1 IMPLANT
SUT ETHILON 3 0 PS 1 (SUTURE) ×6 IMPLANT
SUT VIC AB 0 CT1 27 (SUTURE) ×4
SUT VIC AB 0 CT1 27XBRD ANBCTR (SUTURE) ×2 IMPLANT
SUT VIC AB 1 CT1 27 (SUTURE) ×4
SUT VIC AB 1 CT1 27XBRD ANBCTR (SUTURE) ×2 IMPLANT
SUT VIC AB 2-0 CT1 27 (SUTURE) ×4
SUT VIC AB 2-0 CT1 TAPERPNT 27 (SUTURE) ×2 IMPLANT
TOWEL OR 17X26 10 PK STRL BLUE (TOWEL DISPOSABLE) ×6 IMPLANT
TRAY FOLEY MTR SLVR 16FR STAT (SET/KITS/TRAYS/PACK) IMPLANT
WATER STERILE IRR 1000ML POUR (IV SOLUTION) IMPLANT

## 2018-06-10 NOTE — Progress Notes (Signed)
Chaplain came to visit patient post-up.  Request for referral came from other chaplain who had prayed with patient pre-op.  Patient's eyes were closed when chaplain entered room. Patient did not respond to chaplain calling him by name.  Will continue to follow to be available when pt is alert. Rev. Tamsen Snider Pager [phone]

## 2018-06-10 NOTE — Progress Notes (Signed)
SLP Cancellation Note  Patient Details Name: Richard Vasquez MRN: [id number] DOB: 1960-03-14   Cancelled treatment:       Reason Eval/Treat Not Completed: Other (comment)(RN requested that SLP evaluation be deferred today since pt has been agitated, is now more calm, and may be having surgery today. SLP will follow up on subsequent date.)  Oshae Simmering I. Hardin Negus, [place], [place] Office number [phone] Pager [phone]  Horton Marshall 06/10/2018, 8:58 AM

## 2018-06-10 NOTE — Anesthesia Postprocedure Evaluation (Signed)
Anesthesia Post Note  Patient: JAMARL PEW  Procedure(s) Performed: OPEN REDUCTION INTERNAL FIXATION (ORIF) DISTAL FEMUR FRACTURE (Right Leg Upper)     Patient location during evaluation: PACU Anesthesia Type: General Level of consciousness: sedated and patient cooperative Pain management: pain level controlled Vital Signs Assessment: post-procedure vital signs reviewed and stable Respiratory status: nonlabored ventilation, spontaneous breathing and respiratory function stable Cardiovascular status: blood pressure returned to baseline and stable Postop Assessment: no apparent nausea or vomiting Anesthetic complications: no    Last Vitals:  Vitals:   06/10/18 1448 06/10/18 1516  BP: (!) 146/83 (!) 147/86  Pulse: 87 90  Resp: (!) 23   Temp: 36.4 C 36.9 C  SpO2: 96%     Last Pain:  Vitals:   06/10/18 1516  TempSrc: Oral  PainSc: Asleep                 Danetta Prom,E. Mathan Darroch

## 2018-06-10 NOTE — Progress Notes (Signed)
OT Cancellation Note  Patient Details Name: Richard Vasquez MRN: [id number] DOB: Feb 08, 1960   Cancelled Treatment:    Reason Eval/Treat Not Completed: Patient at procedure or test/ unavailable(OR).   Delight Stare, OT Acute Rehabilitation Services Pager [phone] Office [place] 06/10/2018, 12:01 PM
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
## 2018-06-10 NOTE — Progress Notes (Signed)
Summary- Pt attempting to climb OOB when care assumed. Pt condom cath replaced, pt assisted to a comfortable position. Pt was A&O x 4 to assessment questions. As the shift progressed, pt began "praying", shouting to his diety. Pt was reoriented to place and time with each episode, telling staff he was sorry and would stop. However, the behavior continued as time passed. Pt also crowed like a rooster multiple times. At approx 0100, pt removed his IV. While IV access was re-established, pt shouted that " God did not want him to have any more drugs"  and attempted to remove the IV. Pt again re-oriented to place and situation and agreed to cooperate. At approx 0245, pt was attempting to slide OOB and was stopped by staff and repositioned for safety. Less than two minutes later, as MD being paged, pt repeated the behavior. As this RN stayed in the room to monitor the pt for safety, the charge RN called the MD and orders were rec'd for bilat soft wrist restraints and RX intervention- see MAR. Restraints applied, criteria for release explained to pt who verbalized understanding. Over the next two hours, pt continued to try to climb OOB so he " could get to his knees and pray to his God for forgiveness". MD updated on pt condition at approx 0530, order rec'd to try haldol with the option of ativan in 1 hour if unsuccessful

## 2018-06-10 NOTE — Progress Notes (Signed)
Pt to OR for surgery on right leg. Report called and it is noted that consents not signed. MD and PA aware. This was noted to nurse taking report as well. Patient medicated earlier and in bilateral soft wrist restraints. IV in RAC Saline locked. Vitals signs taken, DBP elevated. Patient denies drug or ETOH use. Denies dentures or other removable body parts. Can state name and date of birth. New ID band and verified,  placed on left wrist. Right leg/knee immobilizer in place. Aspen collar in place. Chart with patient/transporter. Simmie Davies RN

## 2018-06-10 NOTE — Progress Notes (Signed)
Patient seen and evaluated in preoperative holding area.  The patient is somnolent from his medications he received on the floor.  Patient awakens and knows that he is in the hospital and that he needs to get his leg fixed.  However the patient goes in and out of sleep.  There is no other family available to discuss risks and benefits with the patient.  As result of the significant nature of his injury and displacement of his femur fracture we will proceed with emergency consent.  The patient verbalizes understanding of needing surgery and agrees to proceed with surgery.  Shona Needles, MD [facility] [phone] (phone) [phone] (office) [email or web address]

## 2018-06-10 NOTE — Anesthesia Preprocedure Evaluation (Addendum)
Anesthesia Evaluation  Patient identified by MRN, date of birth, ID bandGeneral Assessment Comment:Pt very drowsy  Reviewed: Allergy & Precautions, NPO status , Patient's Chart, lab work & pertinent test results  History of Anesthesia Complications Negative for: history of anesthetic complications  Airway Mallampati: II  TM Distance: >3 FB Neck ROM: Full    Dental  (+) Dental Advisory Given, Poor Dentition   Pulmonary Current Smoker,    breath sounds clear to auscultation       Cardiovascular negative cardio ROS   Rhythm:Regular Rate:Normal     Neuro/Psych Positive for nondisplaced right C2 articular pillar fracture tracking into the right C2 transverse process. Preserved vertebral alignment.    GI/Hepatic negative GI ROS, Neg liver ROS,   Endo/Other  negative endocrine ROS  Renal/GU negative Renal ROS     Musculoskeletal Femur fracture   Abdominal   Peds  Hematology negative hematology ROS (+)   Anesthesia Other Findings   Reproductive/Obstetrics                            Anesthesia Physical Anesthesia Plan  ASA: II and emergent  Anesthesia Plan: General   Post-op Pain Management:    Induction:   PONV Risk Score and Plan: 3 and Ondansetron, Dexamethasone and Treatment may vary due to age or medical condition  Airway Management Planned: Oral ETT  Additional Equipment:   Intra-op Plan:   Post-operative Plan: Extubation in OR  Informed Consent: I have reviewed the patients History and Physical, chart, labs and discussed the procedure including the risks, benefits and alternatives for the proposed anesthesia with the patient or authorized representative who has indicated his/her understanding and acceptance.     Dental advisory given and Only emergency history available  Plan Discussed with: CRNA and Surgeon  Anesthesia Plan Comments:         Anesthesia Quick  Evaluation

## 2018-06-10 NOTE — Anesthesia Procedure Notes (Signed)
Procedure Name: Intubation Date/Time: 06/10/2018 10:49 AM Performed by: Genelle Bal, CRNA Pre-anesthesia Checklist: Patient identified, Emergency Drugs available, Suction available and Patient being monitored Patient Re-evaluated:Patient Re-evaluated prior to induction Oxygen Delivery Method: Circle system utilized Preoxygenation: Pre-oxygenation with 100% oxygen Induction Type: IV induction and Rapid sequence Laryngoscope Size: Glidescope and 3 Grade View: Grade I Tube type: Oral Tube size: 7.5 mm Number of attempts: 1 Airway Equipment and Method: Stylet,  Oral airway and Video-laryngoscopy Placement Confirmation: ETT inserted through vocal cords under direct vision,  positive ETCO2 and breath sounds checked- equal and bilateral Secured at: 23 cm Tube secured with: Tape Dental Injury: Teeth and Oropharynx as per pre-operative assessment  Comments: Elective glidescope intubation d/t C-collar & C2 fx

## 2018-06-10 NOTE — Progress Notes (Signed)
PT Cancellation Note  Patient Details Name: Richard Vasquez MRN: [id number] DOB: 1960/04/11   Cancelled Treatment:    Reason Eval/Treat Not Completed: Patient at procedure or test/unavailable (OR).  Ellamae Sia, PT, DPT Acute Rehabilitation Services Pager [phone] Office [phone]    Willy Eddy 06/10/2018, 10:19 AM

## 2018-06-10 NOTE — Transfer of Care (Signed)
Immediate Anesthesia Transfer of Care Note  Patient: Richard Vasquez  Procedure(s) Performed: OPEN REDUCTION INTERNAL FIXATION (ORIF) DISTAL FEMUR FRACTURE (Right Leg Upper)  Patient Location: PACU  Anesthesia Type:General  Level of Consciousness: drowsy, confused and responds to stimulation  Airway & Oxygen Therapy: Patient Spontanous Breathing and Patient connected to face mask oxygen  Post-op Assessment: Report given to RN and Post -op Vital signs reviewed and stable  Post vital signs: Reviewed and stable  Last Vitals:  Vitals Value Taken Time  BP 161/93 06/10/2018  1:34 PM  Temp    Pulse 99 06/10/2018  1:37 PM  Resp 22 06/10/2018  1:37 PM  SpO2 100 % 06/10/2018  1:37 PM  Vitals shown include unvalidated device data.  Last Pain:  Vitals:   06/10/18 0800  TempSrc: Axillary  PainSc:       Patients Stated Pain Goal: 0 (43/[id number]/95 [id number])  Complications: No apparent anesthesia complications

## 2018-06-10 NOTE — Progress Notes (Signed)
Patient somewhat agitated and confused last night.  Complains of leg and buttocks pain.  Complains of some neck stiffness.  No radiating pain numbness or weakness.  Patient with a minimal articular mass fracture of C2 which is well aligned.  Patient should remain in a cervical collar.  Okay to be mobilized once cleared orthopedically.
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
## 2018-06-11 ENCOUNTER — Encounter (HOSPITAL_COMMUNITY): Payer: Self-pay | Admitting: Student

## 2018-06-11 LAB — CBC
HCT: 29 % — ABNORMAL LOW (ref 39.0–52.0)
Hemoglobin: 10 g/dL — ABNORMAL LOW (ref 13.0–17.0)
MCH: 32.4 pg (ref 26.0–34.0)
MCHC: 34.5 g/dL (ref 30.0–36.0)
MCV: 93.9 fL (ref 80.0–100.0)
Platelets: 183 10*3/uL (ref 150–400)
RBC: 3.09 MIL/uL — ABNORMAL LOW (ref 4.22–5.81)
RDW: 12.8 % (ref 11.5–15.5)
WBC: 20.8 10*3/uL — ABNORMAL HIGH (ref 4.0–10.5)
nRBC: 0 % (ref 0.0–0.2)

## 2018-06-11 MED ORDER — NICOTINE 7 MG/24HR TD PT24
7.0000 mg | MEDICATED_PATCH | Freq: Every day | TRANSDERMAL | Status: DC
  Administered 2018-06-11: 7 mg via TRANSDERMAL
  Filled 2018-06-11 (×2): qty 1

## 2018-06-11 MED ORDER — KETOROLAC TROMETHAMINE 15 MG/ML IJ SOLN
15.0000 mg | Freq: Four times a day (QID) | INTRAMUSCULAR | Status: AC
  Administered 2018-06-11 – 2018-06-12 (×4): 15 mg via INTRAVENOUS
  Filled 2018-06-11 (×4): qty 1
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
## 2018-06-11 NOTE — Progress Notes (Signed)
Orthopedic Tech Progress Note Patient Details:  Richard Vasquez 10-18-1960 [id number] Called in knee brace to [employer]  Patient ID: [name], male   DOB: [birth year], 58 y.o.   MRN: [id number]   [name] 06/11/2018, 9:07 AM
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
## 2018-06-11 NOTE — Progress Notes (Signed)
Rehab Admissions Coordinator Note:  Patient was screened by Cleatrice Burke for appropriateness for an Inpatient Acute Rehab Consult per PT and SLP recommendations.   At this time, we are recommending Inpatient Rehab consult.  Cleatrice Burke RN MSN 06/11/2018, 4:22 PM  I can be reached at [phone].

## 2018-06-12 ENCOUNTER — Inpatient Hospital Stay (HOSPITAL_COMMUNITY)
Admission: RE | Admit: 2018-06-12 | Discharge: 2018-06-19 | DRG: 945 | Disposition: A | Discharge status: Home Health | Payer: Medicaid Other | Source: Intra-hospital | Attending: Physical Medicine & Rehabilitation | Admitting: Physical Medicine & Rehabilitation

## 2018-06-12 ENCOUNTER — Encounter (HOSPITAL_COMMUNITY): Payer: Self-pay

## 2018-06-12 ENCOUNTER — Other Ambulatory Visit: Payer: Self-pay

## 2018-06-12 DIAGNOSIS — D62 Acute posthemorrhagic anemia: Secondary | ICD-10-CM | POA: Diagnosis present

## 2018-06-12 DIAGNOSIS — Z716 Tobacco abuse counseling: Secondary | ICD-10-CM

## 2018-06-12 DIAGNOSIS — S0101XD Laceration without foreign body of scalp, subsequent encounter: Secondary | ICD-10-CM

## 2018-06-12 DIAGNOSIS — S12191S Other nondisplaced fracture of second cervical vertebra, sequela: Secondary | ICD-10-CM

## 2018-06-12 DIAGNOSIS — N400 Enlarged prostate without lower urinary tract symptoms: Secondary | ICD-10-CM | POA: Diagnosis present

## 2018-06-12 DIAGNOSIS — S72451S Displaced supracondylar fracture without intracondylar extension of lower end of right femur, sequela: Secondary | ICD-10-CM

## 2018-06-12 DIAGNOSIS — Z791 Long term (current) use of non-steroidal anti-inflammatories (NSAID): Secondary | ICD-10-CM

## 2018-06-12 DIAGNOSIS — Z79899 Other long term (current) drug therapy: Secondary | ICD-10-CM

## 2018-06-12 DIAGNOSIS — S72451D Displaced supracondylar fracture without intracondylar extension of lower end of right femur, subsequent encounter for closed fracture with routine healing: Secondary | ICD-10-CM

## 2018-06-12 DIAGNOSIS — S06309D Unspecified focal traumatic brain injury with loss of consciousness of unspecified duration, subsequent encounter: Secondary | ICD-10-CM | POA: Diagnosis present

## 2018-06-12 DIAGNOSIS — S12100D Unspecified displaced fracture of second cervical vertebra, subsequent encounter for fracture with routine healing: Secondary | ICD-10-CM | POA: Diagnosis not present

## 2018-06-12 DIAGNOSIS — S069X2S Unspecified intracranial injury with loss of consciousness of 31 minutes to 59 minutes, sequela: Secondary | ICD-10-CM

## 2018-06-12 DIAGNOSIS — S12100A Unspecified displaced fracture of second cervical vertebra, initial encounter for closed fracture: Secondary | ICD-10-CM

## 2018-06-12 DIAGNOSIS — F17200 Nicotine dependence, unspecified, uncomplicated: Secondary | ICD-10-CM | POA: Diagnosis present

## 2018-06-12 DIAGNOSIS — G8929 Other chronic pain: Secondary | ICD-10-CM | POA: Diagnosis present

## 2018-06-12 DIAGNOSIS — S72421D Displaced fracture of lateral condyle of right femur, subsequent encounter for closed fracture with routine healing: Secondary | ICD-10-CM | POA: Diagnosis not present

## 2018-06-12 DIAGNOSIS — F419 Anxiety disorder, unspecified: Secondary | ICD-10-CM | POA: Diagnosis present

## 2018-06-12 HISTORY — DX: Unspecified displaced fracture of second cervical vertebra, initial encounter for closed fracture: S12.100A

## 2018-06-12 LAB — CBC
HCT: 26.3 % — ABNORMAL LOW (ref 39.0–52.0)
HCT: 25.7 % — ABNORMAL LOW (ref 39.0–52.0)
Hemoglobin: 8.9 g/dL — ABNORMAL LOW (ref 13.0–17.0)
Hemoglobin: 8.7 g/dL — ABNORMAL LOW (ref 13.0–17.0)
MCH: 31.9 pg (ref 26.0–34.0)
MCH: 32.3 pg (ref 26.0–34.0)
MCHC: 33.8 g/dL (ref 30.0–36.0)
MCHC: 33.9 g/dL (ref 30.0–36.0)
MCV: 94.3 fL (ref 80.0–100.0)
MCV: 95.5 fL (ref 80.0–100.0)
Platelets: 162 10*3/uL (ref 150–400)
Platelets: 168 10*3/uL (ref 150–400)
RBC: 2.79 MIL/uL — ABNORMAL LOW (ref 4.22–5.81)
RBC: 2.69 MIL/uL — ABNORMAL LOW (ref 4.22–5.81)
RDW: 12.6 % (ref 11.5–15.5)
RDW: 12.8 % (ref 11.5–15.5)
WBC: 9.8 10*3/uL (ref 4.0–10.5)
WBC: 11.2 10*3/uL — ABNORMAL HIGH (ref 4.0–10.5)
nRBC: 0 % (ref 0.0–0.2)
nRBC: 0 % (ref 0.0–0.2)

## 2018-06-12 LAB — CREATININE, SERUM
Creatinine, Ser: 0.48 mg/dL — ABNORMAL LOW (ref 0.61–1.24)
GFR calc Af Amer: >60 mL/min (ref >60)
GFR calc non Af Amer: >60 mL/min (ref >60)

## 2018-06-12 MED ORDER — ENOXAPARIN SODIUM 40 MG/0.4ML ~~LOC~~ SOLN
40.0000 mg | SUBCUTANEOUS | Status: DC
  Administered 2018-06-13 – 2018-06-19 (×7): 40 mg via SUBCUTANEOUS
  Filled 2018-06-12 (×7): qty 0.4

## 2018-06-12 MED ORDER — SORBITOL 70 % SOLN: 30.0000 mL | Freq: Every day | Status: DC | PRN

## 2018-06-12 MED ORDER — VITAMIN B-1 100 MG PO TABS
100.0000 mg | ORAL_TABLET | Freq: Every day | ORAL | Status: DC
  Administered 2018-06-13 – 2018-06-19 (×7): 100 mg via ORAL
  Filled 2018-06-12 (×7): qty 1

## 2018-06-12 MED ORDER — ONDANSETRON HCL 4 MG/2ML IJ SOLN: 4.0000 mg | Freq: Four times a day (QID) | INTRAMUSCULAR | Status: DC | PRN

## 2018-06-12 MED ORDER — NICOTINE 7 MG/24HR TD PT24
7.0000 mg | MEDICATED_PATCH | Freq: Every day | TRANSDERMAL | Status: DC
  Administered 2018-06-19: 09:00:00 7 mg via TRANSDERMAL
  Filled 2018-06-12 (×3): qty 1

## 2018-06-12 MED ORDER — POLYETHYLENE GLYCOL 3350 17 G PO PACK
17.0000 g | PACK | Freq: Every day | ORAL | Status: DC
  Administered 2018-06-13 – 2018-06-19 (×6): 17 g via ORAL
  Filled 2018-06-12 (×7): qty 1

## 2018-06-12 MED ORDER — OXYCODONE HCL 5 MG PO TABS
5.0000 mg | ORAL_TABLET | ORAL | Status: DC | PRN
  Administered 2018-06-12: 5 mg via ORAL
  Administered 2018-06-13 – 2018-06-18 (×22): 10 mg via ORAL
  Administered 2018-06-18: 5 mg via ORAL
  Administered 2018-06-18 – 2018-06-19 (×6): 10 mg via ORAL
  Filled 2018-06-12 (×4): qty 2
  Filled 2018-06-12: qty 1
  Filled 2018-06-12 (×25): qty 2

## 2018-06-12 MED ORDER — PANTOPRAZOLE SODIUM 40 MG PO TBEC
40.0000 mg | DELAYED_RELEASE_TABLET | Freq: Every day | ORAL | Status: DC
  Administered 2018-06-13 – 2018-06-19 (×7): 40 mg via ORAL
  Filled 2018-06-12 (×7): qty 1

## 2018-06-12 MED ORDER — ADULT MULTIVITAMIN W/MINERALS CH
1.0000 | ORAL_TABLET | Freq: Every day | ORAL | Status: DC
  Administered 2018-06-13 – 2018-06-19 (×7): 1 via ORAL
  Filled 2018-06-12 (×7): qty 1

## 2018-06-12 MED ORDER — ONDANSETRON 4 MG PO TBDP
4.0000 mg | ORAL_TABLET | Freq: Four times a day (QID) | ORAL | Status: DC | PRN
  Filled 2018-06-12: qty 1

## 2018-06-12 MED ORDER — POLYETHYLENE GLYCOL 3350 17 G PO PACK
17.0000 g | PACK | Freq: Every day | ORAL | Status: DC
  Administered 2018-06-12: 17 g via ORAL
  Filled 2018-06-12: qty 1

## 2018-06-12 MED ORDER — METHOCARBAMOL 500 MG PO TABS
500.0000 mg | ORAL_TABLET | Freq: Four times a day (QID) | ORAL | Status: DC | PRN
  Administered 2018-06-12 – 2018-06-19 (×17): 500 mg via ORAL
  Filled 2018-06-12 (×17): qty 1

## 2018-06-12 MED ORDER — ACETAMINOPHEN 325 MG PO TABS
650.0000 mg | ORAL_TABLET | Freq: Four times a day (QID) | ORAL | Status: DC
  Administered 2018-06-12 – 2018-06-19 (×27): 650 mg via ORAL
  Filled 2018-06-12 (×27): qty 2

## 2018-06-12 MED ORDER — GABAPENTIN 100 MG PO CAPS
100.0000 mg | ORAL_CAPSULE | Freq: Three times a day (TID) | ORAL | Status: DC
  Administered 2018-06-12 – 2018-06-19 (×21): 100 mg via ORAL
  Filled 2018-06-12 (×21): qty 1

## 2018-06-12 MED ORDER — PANTOPRAZOLE SODIUM 40 MG IV SOLR
40.0000 mg | Freq: Every day | INTRAVENOUS | Status: DC
  Filled 2018-06-12: qty 40

## 2018-06-12 MED ORDER — DOCUSATE SODIUM 100 MG PO CAPS
100.0000 mg | ORAL_CAPSULE | Freq: Two times a day (BID) | ORAL | Status: DC
  Administered 2018-06-12 – 2018-06-19 (×12): 100 mg via ORAL
  Filled 2018-06-12 (×13): qty 1

## 2018-06-12 MED ORDER — ENOXAPARIN SODIUM 40 MG/0.4ML ~~LOC~~ SOLN: 40.0000 mg | SUBCUTANEOUS | Status: DC

## 2018-06-12 NOTE — Social Work (Signed)
CSW spoke with pt via telephone. Introduced self, role, reason for call. Pt amenable to speaking with CSW. He is extremely pleased to be heading to CIR and "can't thank [place] enough for the care here." Pt lives at home with a friend and is eager to get home when he can. He knows that a Education officer, museum and full care team will follow him at CIR.  SBIRT complete, pt states he has been ETOH free for a year which is he very proud of.   CSW signing off. Please consult if any additional needs arise.  Alexander Mt, [place] Work [phone]

## 2018-06-12 NOTE — Progress Notes (Signed)
Orthopedic Tech Progress Note Patient Details:  Richard Vasquez August 05, 1960 [id number] RN said patient has collar Patient ID: Richard Vasquez, male   DOB: April 22, 1960, 58 y.o.   MRN: [id number]   Richard Vasquez 06/12/2018, 12:27 PM

## 2018-06-12 NOTE — Progress Notes (Signed)
Inpatient Rehabilitation Admissions Coordinator  Inpatient Rehab Consult received. I met with patient at the bedside for rehabilitation assessment. We discussed goals and expectations of an inpatient rehab admission.  Pt prefers an inpt rehab admit. I spoke with his landlord, Lynelle Smoke, and she agrees pt needs rehab before returning to her home. I contacted Trauma PA and pt can be d/c'd to CIR today.. I have made RN CM, Almyra Free aware and will make the arrangements to admit today.  Danne Baxter, RN, MSN Rehab Admissions Coordinator [phone] 06/12/2018 1:01 PM

## 2018-06-12 NOTE — Progress Notes (Signed)
Orthopedic Tech Progress Note Patient Details:  Richard Vasquez April 20, 1960 [id number] New RN said patient does have cervical collar Patient ID: Richard Vasquez, male   DOB: [birth year], 58 y.o.   MRN: [id number]   Richard Vasquez 06/12/2018, 5:43 PM

## 2018-06-12 NOTE — Discharge Instructions (Signed)
325mg  of aspirin twice a day Will need primary care set up with [facility] at discharge to discuss smoking cessation, and further primary care needs

## 2018-06-12 NOTE — Progress Notes (Signed)
SLP Cancellation Note  Patient Details Name: ORLANDUS BOROWSKI MRN: [id number] DOB: 30-Nov-1960   Cancelled treatment:       Reason Eval/Treat Not Completed: Patient at procedure or test/unavailable. Pt currently in the process of being transferred to CIR via bed with RN.   Jamise Pentland I. Hardin Negus, [place], [place] Office number [phone] Pager [phone]  Horton Marshall 06/12/2018, 4:41 PM
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
## 2018-06-12 NOTE — Progress Notes (Signed)
Patient ID: Richard Vasquez, male   DOB: 05-29-1960, 58 y.o.   MRN: [id number]    2 Days Post-Op  Subjective: Patient with no complaints.  Working with therapies right now.  Eating well.  Pain is controlled.  Objective: Vital signs in last 24 hours: Temp:  [97.7 F (36.5 C)-98.6 F (37 C)] 97.7 F (36.5 C) (05/15 0800) Pulse Rate:  [70-99] 88 (05/15 0800) Resp:  [15-21] 15 (05/15 0800) BP: (116-149)/(72-90) 116/76 (05/15 0800) SpO2:  [92 %-99 %] 99 % (05/15 0800) Last BM Date: (PTA)  Intake/Output from previous day: 05/14 0701 - 05/15 0700 In: 1892.3 [P.O.:240; I.V.:1452.3; IV Piggyback:200] Out: 2650 [Urine:2650] Intake/Output this shift: Total I/O In: 240 [P.O.:240] Out: 1200 [Urine:1200]  PE: Gen: NAD Neck: c-collar in place, needs fitted collar Heart: regular Lungs: CTAB Abd: soft, NT, ND Ext: RLE in knee immobilize, NVI, sensation normal, + pedal pulses Psych: A&O x4  Lab Results:  Recent Labs    06/11/18 0436 06/12/18 0356  WBC 20.8* 9.8  HGB 10.0* 8.9*  HCT 29.0* 26.3*  PLT 183 162   BMET Recent Labs    06/10/18 0219  NA 136  K 3.9  CL 104  CO2 24  GLUCOSE 108*  BUN 8  CREATININE 0.58*  CALCIUM 9.0   PT/INR No results for input(s): LABPROT, INR in the last 72 hours. CMP     Component Value Date/Time   NA 136 06/10/2018 0219   K 3.9 06/10/2018 0219   CL 104 06/10/2018 0219   CO2 24 06/10/2018 0219   GLUCOSE 108 (H) 06/10/2018 0219   BUN 8 06/10/2018 0219   CREATININE 0.58 (L) 06/10/2018 0219   CALCIUM 9.0 06/10/2018 0219   PROT 7.0 06/09/2018 0845   ALBUMIN 3.9 06/09/2018 0845   AST 41 06/09/2018 0845   ALT 36 06/09/2018 0845   ALKPHOS 74 06/09/2018 0845   BILITOT 0.8 06/09/2018 0845   GFRNONAA >60 06/10/2018 0219   GFRAA >60 06/10/2018 0219   Lipase  No results found for: LIPASE     Studies/Results: Dg C-arm 1-60 Min  Result Date: 06/10/2018 CLINICAL DATA:  ORIF right femur fracture EXAM: DG C-ARM 61-120 MIN; RIGHT FEMUR  2 VIEWS COMPARISON:  06/09/2018 FINDINGS: Multiple C-arm images were obtained in the operating room demonstrating plate and screw fixation of lateral femoral condyle fracture. Satisfactory fracture alignment following reduction and ORIF. No immediate complication. IMPRESSION: Satisfactory ORIF lateral femoral condyle fracture. Electronically Signed   By: Franchot Gallo M.D.   On: 06/10/2018 13:43   Dg Femur, Min 2 Views Right  Result Date: 06/10/2018 CLINICAL DATA:  ORIF right femur fracture EXAM: DG C-ARM 61-120 MIN; RIGHT FEMUR 2 VIEWS COMPARISON:  06/09/2018 FINDINGS: Multiple C-arm images were obtained in the operating room demonstrating plate and screw fixation of lateral femoral condyle fracture. Satisfactory fracture alignment following reduction and ORIF. No immediate complication. IMPRESSION: Satisfactory ORIF lateral femoral condyle fracture. Electronically Signed   By: Franchot Gallo M.D.   On: 06/10/2018 13:43   Dg Femur Port, Min 2 Views Right  Result Date: 06/10/2018 CLINICAL DATA:  Followup ORIF of right femur fracture. EXAM: RIGHT FEMUR PORTABLE 2 VIEW COMPARISON:  Earlier same day FINDINGS: No proximal bone abnormality. Interval placement of lateral plate and screws for reduction of a distal femoral fracture. Position and alignment appear near anatomic presently. Air and fluid in the joint as expected. IMPRESSION: ORIF of distal femur fracture with near anatomic position and alignment. Electronically Signed   By:  Nelson Chimes M.D.   On: 06/10/2018 15:19    Anti-infectives: Anti-infectives (From admission, onward)   Start     Dose/Rate Route Frequency Ordered Stop   06/10/18 2200  ceFAZolin (ANCEF) IVPB 2g/100 mL premix     2 g 200 mL/hr over 30 Minutes Intravenous Every 8 hours 06/10/18 1848 06/11/18 2111   06/10/18 1233  vancomycin (VANCOCIN) powder  Status:  Discontinued       As needed 06/10/18 1234 06/10/18 1329   06/10/18 1100  ceFAZolin (ANCEF) IVPB 2g/100 mL premix     2 g  200 mL/hr over 30 Minutes Intravenous To ShortStay Surgical 06/10/18 1013 06/10/18 1122   06/10/18 1002  ceFAZolin (ANCEF) 2-4 GM/100ML-% IVPB    Note to Pharmacy:  Ernesta Amble   : cabinet override      06/10/18 1002 06/10/18 1052       Assessment/Plan [facility] Concussion - plan PT/OT/speech for cognition, recommend CIR Delirium- resolved C2 FX- Aspen collar,NS with no new recs today except continue in collar. R lateral femoral condyle FX- per Dr. Doreatha Martin, ORIF 5/14.  PT/OT today FEN -IVFs, regular diet, nicotine patch 62mcg (cleared with ortho) VTE -Lovenox, ASA upon discharge ID -[email or web address] preop for ortho Dispo - awaiting CIR decision vs other recs   LOS: 3 days    Henreitta Cea , [facility] 06/12/2018, 9:41 AM Pager: [phone]
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
## 2018-06-13 ENCOUNTER — Inpatient Hospital Stay (HOSPITAL_COMMUNITY): Payer: Medicaid Other

## 2018-06-13 ENCOUNTER — Inpatient Hospital Stay (HOSPITAL_COMMUNITY): Payer: Self-pay

## 2018-06-13 ENCOUNTER — Inpatient Hospital Stay (HOSPITAL_COMMUNITY): Payer: Self-pay | Admitting: Physical Therapy

## 2018-06-13 DIAGNOSIS — S12100D Unspecified displaced fracture of second cervical vertebra, subsequent encounter for fracture with routine healing: Secondary | ICD-10-CM

## 2018-06-13 DIAGNOSIS — M7989 Other specified soft tissue disorders: Secondary | ICD-10-CM

## 2018-06-13 NOTE — Progress Notes (Signed)
VASCULAR LAB PRELIMINARY  PRELIMINARY  PRELIMINARY  PRELIMINARY  Bilateral lower extremity venous duplex completed.    Preliminary report:  See CV Proc for preliminary report.  Ashya Nicolaisen, RVT 06/13/2018, 12:15 PM
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
## 2018-06-13 NOTE — Progress Notes (Signed)
Physical Therapy Session Note  Patient Details  Name: Richard Vasquez MRN: [id number] Date of Birth: 1960-07-03  Today's Date: 06/13/2018 PT Individual Time: 1194-1740 PT Individual Time Calculation (min): 57 min   Short Term Goals: Week 1:  PT Short Term Goal 1 (Week 1): Patient to perform all transfers with S with safe technique.  PT Short Term Goal 2 (Week 1): Patient to ambulate 66' with RW and S with safe technique. PT Short Term Goal 3 (Week 1): Patient to demonstrate standing balance with 1 UE support with S for functional task.  Skilled Therapeutic Interventions/Progress Updates: Pt presented in bed agreeable to therapy. Evaluating PT provided verbal information to PTA prior to treatment session. Pt stating no pain at rest and was premedicated. Pt performed supine to sit with use of bed features and CGA. Performed squat pivot transfer to w/c minA with verbal cues for maintaining NWB as PTA noted pt's toes on ground. Pt indicated urinary urgency and requesting if can ambulate to bathroom. Pt performed STS from w/c CGA and ambulated CGA approx 76f to bathroom. Pt initially indicating would sit at toilet then insisted in trying to stand to perform activity. Pt voided in standing with RW with heavy CGA and pt maintaining NWB. Pt then ambulated back to w/c in same manner as prior. Pt propelled >3062fto ortho gym and trialed car transfer. Pt was unable to bend R knee sufficiently to place RLE in car however was able to place in car if scooted back. Discussed car set up and pt advised will most likely be traveling home in compact SUV. PTA providing edu to continue R knee ROM with pt stating MD wants pt to reach 90 degrees within 2 wks. Adv will be able to complete car transfer if able to attain 90. Pt then ambulated to mat within gym and transferred sitting to long sit on mat. Pt then participated in gentle therex and ROM as follows: QS x 15, hip abd/add x 15, AA SLR x 5 due to increased pain, LAQ  within avail range x 10, sitting knee flexion stretch x 5. Pt then performed ambulatory transfer back to w/c CGA and PTA provided edu on w/c parts management with pt able to lock/unlock breaks and release leg rests. Pt propelled back to room and performed ambulatory transfer to bed and returned to supine CGA. Pt left in bed with bed alarm on, call bell within reach and needs met.      Therapy Documentation Precautions:  Precautions Precautions: Fall, Cervical Precaution Booklet Issued: No Precaution Comments: Pt recalled RLE nonweightbearing  Required Braces or Orthoses: Cervical Brace, Other Brace Cervical Brace: Hard collar, At all times Other Brace: right hinged knee brace Restrictions Weight Bearing Restrictions: Yes RLE Weight Bearing: Non weight bearing General:   Vital Signs: Therapy Vitals Temp: 98.5 F (36.9 C) Temp Source: Oral Pulse Rate: 93 Resp: 18 BP: 134/76 Patient Position (if appropriate): Sitting Oxygen Therapy SpO2: 95 % O2 Device: Room Air Pain: Pain Assessment Pain Score: 4  Mobility:   Locomotion :    Trunk/Postural Assessment :    Balance:   Exercises:   Other Treatments:      Therapy/Group: Individual Therapy  Amoreena Neubert 06/13/2018, 3:55 PM
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
## 2018-06-14 NOTE — Progress Notes (Signed)
Nurse entered room to find patient had removed leg brace, and all dressings to incisions stating they were itching. Educated patient on infection prevention, and care plan/doctors orders on brace removal. Non adherent dressing applied to main medial suture line of knee, Vaseline gauze to top two sutures sites r/t patient picking to ease itching. Gauze sponges placed above dressings. Rewrapped ace bandage and reapplied leg brace per orders. Patient denies other needs at this time.

## 2018-06-15 ENCOUNTER — Inpatient Hospital Stay (HOSPITAL_COMMUNITY): Payer: Self-pay | Admitting: Physical Therapy

## 2018-06-15 ENCOUNTER — Inpatient Hospital Stay (HOSPITAL_COMMUNITY): Payer: Self-pay | Admitting: Occupational Therapy

## 2018-06-15 DIAGNOSIS — S069X1S Unspecified intracranial injury with loss of consciousness of 30 minutes or less, sequela: Secondary | ICD-10-CM

## 2018-06-15 LAB — COMPREHENSIVE METABOLIC PANEL
ALT: 20 U/L (ref 0–44)
AST: 19 U/L (ref 15–41)
Albumin: 2.7 g/dL — ABNORMAL LOW (ref 3.5–5.0)
Alkaline Phosphatase: 53 U/L (ref 38–126)
Anion gap: 8 (ref 5–15)
BUN: 9 mg/dL (ref 6–20)
CO2: 27 mmol/L (ref 22–32)
Calcium: 8.5 mg/dL — ABNORMAL LOW (ref 8.9–10.3)
Chloride: 103 mmol/L (ref 98–111)
Creatinine, Ser: 0.59 mg/dL — ABNORMAL LOW (ref 0.61–1.24)
GFR calc Af Amer: >60 mL/min (ref >60)
GFR calc non Af Amer: >60 mL/min (ref >60)
Glucose, Bld: 97 mg/dL (ref 70–99)
Potassium: 4.5 mmol/L (ref 3.5–5.1)
Sodium: 138 mmol/L (ref 135–145)
Total Bilirubin: 0.6 mg/dL (ref 0.3–1.2)
Total Protein: 5.6 g/dL — ABNORMAL LOW (ref 6.5–8.1)

## 2018-06-15 LAB — CBC WITH DIFFERENTIAL/PLATELET
Abs Immature Granulocytes: 0.05 10*3/uL (ref 0.00–0.07)
Basophils Absolute: 0.1 10*3/uL (ref 0.0–0.1)
Basophils Relative: 1 %
Eosinophils Absolute: 0.8 10*3/uL — ABNORMAL HIGH (ref 0.0–0.5)
Eosinophils Relative: 7 %
HCT: 27.4 % — ABNORMAL LOW (ref 39.0–52.0)
Hemoglobin: 9.1 g/dL — ABNORMAL LOW (ref 13.0–17.0)
Immature Granulocytes: 1 %
Lymphocytes Relative: 17 %
Lymphs Abs: 1.9 10*3/uL (ref 0.7–4.0)
MCH: 31.7 pg (ref 26.0–34.0)
MCHC: 33.2 g/dL (ref 30.0–36.0)
MCV: 95.5 fL (ref 80.0–100.0)
Monocytes Absolute: 1.1 10*3/uL — ABNORMAL HIGH (ref 0.1–1.0)
Monocytes Relative: 10 %
Neutro Abs: 6.8 10*3/uL (ref 1.7–7.7)
Neutrophils Relative %: 64 %
Platelets: 241 10*3/uL (ref 150–400)
RBC: 2.87 MIL/uL — ABNORMAL LOW (ref 4.22–5.81)
RDW: 12.4 % (ref 11.5–15.5)
WBC: 10.7 10*3/uL — ABNORMAL HIGH (ref 4.0–10.5)
nRBC: 0 % (ref 0.0–0.2)

## 2018-06-15 MED ORDER — VITAMIN C 500 MG PO TABS
1000.0000 mg | ORAL_TABLET | Freq: Every day | ORAL | Status: DC
  Administered 2018-06-15 – 2018-06-19 (×5): 1000 mg via ORAL
  Filled 2018-06-15 (×5): qty 2

## 2018-06-15 MED ORDER — CHLORHEXIDINE GLUCONATE 0.12 % MT SOLN
15.0000 mL | Freq: Two times a day (BID) | OROMUCOSAL | Status: DC
  Administered 2018-06-15 – 2018-06-18 (×8): 15 mL via OROMUCOSAL
  Filled 2018-06-15 (×10): qty 15

## 2018-06-15 MED ORDER — OMEGA-3-ACID ETHYL ESTERS 1 G PO CAPS
1.0000 g | ORAL_CAPSULE | Freq: Two times a day (BID) | ORAL | Status: DC
  Administered 2018-06-15 – 2018-06-19 (×9): 1 g via ORAL
  Filled 2018-06-15 (×9): qty 1

## 2018-06-15 NOTE — Progress Notes (Signed)
Physical Therapy Session Note  Patient Details  Name: Richard Vasquez MRN: [id number] Date of Birth: Jun 12, 1960  Today's Date: 06/15/2018 PT Individual Time: 1000-1100 PT Individual Time Calculation (min): 60 min   Short Term Goals: Week 1:  PT Short Term Goal 1 (Week 1): Patient to perform all transfers with S with safe technique.  PT Short Term Goal 2 (Week 1): Patient to ambulate 41' with RW and S with safe technique. PT Short Term Goal 3 (Week 1): Patient to demonstrate standing balance with 1 UE support with S for functional task.  Skilled Therapeutic Interventions/Progress Updates:    Pt received seated in w/c in room, agreeable to PT session. No complaints of pain. Manual w/c propulsion 2 x 150 ft with use of BUE and Supervision. Pt requires increased cueing for management of w/c around turns. Obstacle course in w/c navigating cones with cueing for obstacle avoidance. Pt does better with 2nd trial and demos good understanding and carryover of w/c skills. Pt requires v/c and demonstration for management of w/c parts and to set w/c up for transfer. Demonstrated how to adjust cervical collar with pt seated in front of mirror, reviewed that pt is to wear the collar at all times and reviewed cervical precautions. Stand pivot transfer with CGA and RW. Sit to/from semi-reclined on therapy wedge on mat with CGA. Per pt report he did not lay flat on his bed at home prior to this injury due to low back pain. Ambulation x 80 ft with RW and CGA before onset of fatigue. Pt reports he plans on standing to shower and to urinate once he d/c home, education with patient about decreased balance standing only on LLE and that this is unsafe. Pt is receptive to education and to sitting for bathing and toileting at home. Pt also reports he occasionally uses his RLE in standing to steady himself, education with patient about his WBing precautions and that he cannot set this leg on the floor and put any weight through  it, pt receptive. Pt left seated in w/c in room with needs in reach, quick release belt and chair alarm in place at end of session.  Therapy Documentation Precautions:  Precautions Precautions: Fall, Cervical Precaution Booklet Issued: No Precaution Comments: Pt recalled RLE nonweightbearing  Required Braces or Orthoses: Cervical Brace, Other Brace Cervical Brace: Hard collar, At all times Other Brace: right hinged knee brace Restrictions Weight Bearing Restrictions: Yes RLE Weight Bearing: Non weight bearing    Therapy/Group: Individual Therapy   Excell Seltzer, PT, DPT  06/15/2018, 12:19 PM

## 2018-06-15 NOTE — Progress Notes (Signed)
Inpatient Rehabilitation  Patient information reviewed and entered into eRehab system by Famous Eisenhardt M. Jacquelynne Guedes, M.A., CCC/SLP, PPS Coordinator.  Information including medical coding, functional ability and quality indicators will be reviewed and updated through discharge.    

## 2018-06-15 NOTE — Progress Notes (Signed)
Occupational Therapy Session Note  Patient Details  Name: Richard Vasquez MRN: [id number] Date of Birth: November 16, 1960  Today's Date: 06/15/2018 OT Individual Time: 0277-4128 OT Individual Time Calculation (min): 54 min    Short Term Goals: Week 1:  OT Short Term Goal 1 (Week 1): Pt will complete toilet transfer wiht LRAD and S OT Short Term Goal 2 (Week 1): Pt will completes shower trnasfer wiht LRAD and S OT Short Term Goal 3 (Week 1): Pt will don pants wiht CGA OT Short Term Goal 4 (Week 1): pt will recall precautions wiht min question cues  Skilled Therapeutic Interventions/Progress Updates:    Upon entering the room, pt supine in bed with 7/10 c/o pain in R LE. RN notified and medication given this session. OT educated pt on importance of wearing soft collar and hinged brace for safety. Pt verbalized understanding. Pt donning LB clothing from bed level with min A to thread R LE. Stand pivot transfer with use of RW and min guard. Pt propelled wheelchair to sink for grooming tasks and UB self care with set up A to obtain all needed items. Pt ambulating to bathroom for toileting needs with min guard and min A to maintain NWB status of R LE. Pt impulsive with movement as well and needing min cuing for safety awareness. Pt returning to wheelchair with chair alarm belt donned for safety with call bell and all needed items within reach.   Therapy Documentation Precautions:  Precautions Precautions: Fall, Cervical Precaution Booklet Issued: No Precaution Comments: Pt recalled RLE nonweightbearing  Required Braces or Orthoses: Cervical Brace, Other Brace Cervical Brace: Hard collar, At all times Other Brace: right hinged knee brace Restrictions Weight Bearing Restrictions: Yes RLE Weight Bearing: Non weight bearing General:   Vital Signs:   Pain: Pain Assessment Pain Scale: 0-10 Pain Score: 5  ADL:   Vision   Perception    Praxis   Exercises:   Other Treatments:      Therapy/Group: Individual Therapy  [name] 06/15/2018, 8:56 AM
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
## 2018-06-15 NOTE — Progress Notes (Signed)
Physical Therapy Session Note  Patient Details  Name: Richard Vasquez MRN: [id number] Date of Birth: 13-Nov-1960  Today's Date: 06/15/2018 PT Individual Time: 1335-1430 PT Individual Time Calculation (min): 55 min   Short Term Goals: Week 1:  PT Short Term Goal 1 (Week 1): Patient to perform all transfers with S with safe technique.  PT Short Term Goal 2 (Week 1): Patient to ambulate 26' with RW and S with safe technique. PT Short Term Goal 3 (Week 1): Patient to demonstrate standing balance with 1 UE support with S for functional task.  Skilled Therapeutic Interventions/Progress Updates:  Pt received in w/c & agreeable to tx. Therapist educates pt to not move neck throughout session (poor carryover observed from pt) and tightens c-collar. Pt reports need to use restroom and therapist sets room up & places elevated toilet seat over commode and as therapist exits bathroom observes pt has stood and began walking to bathroom without RW and without notifying therapist. Therapist provided pt with RW and educated pt on need to use RW at all times and maintain RLE NWB at all times. Pt ambulates in room/bathroom with RW & min assist, maintaining NWB RLE with cuing throughout, and requiring cuing to focus on task at hand especially when negotiating bathroom threshold as pt very internally distracted throughout. Pt requires cuing to sit to void (vs standing) and does so continently. Pt returns to bed and transfers sit>supine with supervision and HOB elevated with pt reporting he plans to use wedge to elevate HOB at home. While supine in bed pt performed the following RLE AROM exercises with instructional cuing for technique: heel slides & short arc quads, 2 sets of 10 reps with pt reporting the exercises "just about wore me out". Reapplied brace & pt performs RLE straight leg raises and hip abduction slides. At end of session pt left in bed with alarm set & needs at hand.   Pt c/o pain in posterior R thigh and  2 small blisters noted to skin - RN notified & applied dressing & therapist re-wrapped RLE with ace wrap afterwards.  Educated pt on ability to remove brace for ROM exercises during day with therapy but need to wear it otherwise.  RN notified of pt's impulsivity.   Therapy Documentation Precautions:  Precautions Precautions: Fall, Cervical Precaution Booklet Issued: No Precaution Comments: Pt recalled RLE nonweightbearing  Required Braces or Orthoses: Cervical Brace, Other Brace Cervical Brace: Hard collar, At all times Other Brace: right hinged knee brace (can be removed for ROM during day with therapy, needs to be locked in extension at night) Restrictions Weight Bearing Restrictions: Yes RLE Weight Bearing: Non weight bearing  Pain: No c/o pain at rest but pt reports 6/10 pain in R knee with AROM exercises & rest breaks & repositioning provided & pt requests pain meds.   Therapy/Group: Individual Therapy  [name] 06/15/2018, 2:33 PM
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
## 2018-06-15 NOTE — Plan of Care (Signed)
  Problem: Consults Goal: RH SPINAL CORD INJURY PATIENT EDUCATION Description  See Patient Education module for education specifics.  Outcome: Progressing   Problem: SCI BOWEL ELIMINATION Goal: RH STG SCI MANAGE BOWEL WITH MEDICATION WITH ASSISTANCE Description STG SCI Manage bowel with medication with mod I assistance.  Outcome: Progressing   Problem: RH SKIN INTEGRITY Goal: RH STG SKIN FREE OF INFECTION/BREAKDOWN Description Patients skin will remain free from further infection or breakdown with min assist.  Outcome: Progressing Goal: RH STG MAINTAIN SKIN INTEGRITY WITH ASSISTANCE Description STG Maintain Skin Integrity With min Assistance.  Outcome: Progressing

## 2018-06-16 ENCOUNTER — Inpatient Hospital Stay (HOSPITAL_COMMUNITY): Payer: Self-pay | Admitting: Physical Therapy

## 2018-06-16 ENCOUNTER — Inpatient Hospital Stay (HOSPITAL_COMMUNITY): Payer: Self-pay | Admitting: Occupational Therapy

## 2018-06-16 NOTE — Progress Notes (Signed)
Physical Therapy Session Note  Patient Details  Name: Richard Vasquez MRN: [id number] Date of Birth: 11-Aug-1960  Today's Date: 06/16/2018 PT Individual Time: 1300-1400 PT Individual Time Calculation (min): 60 min   Short Term Goals: Week 1:  PT Short Term Goal 1 (Week 1): Patient to perform all transfers with S with safe technique.  PT Short Term Goal 2 (Week 1): Patient to ambulate 31' with RW and S with safe technique. PT Short Term Goal 3 (Week 1): Patient to demonstrate standing balance with 1 UE support with S for functional task.  Skilled Therapeutic Interventions/Progress Updates:    Pt received seated in w/c in room, agreeable to PT session. No complaints of pain. Manual w/c propulsion x 150 ft with use of BUE and Supervision. Pt requires v/c for setting w/c up for safe transfer to mat table. Squat pivot transfer w/c to mat table with CGA. Sit to supine Supervision. Supine on therapy wedge BLE therex: heel slides, hip abd, SLR, SAQ x 10 reps with AAROM for RLE with brace removed. Supine to sit with Supervision. Seated BLE therex: marches, LAQ with brace removed, AAROM for RLE. Pt reports urge to urinate. Squat pivot transfer back to w/c CGA. W/c mobility back to pt's room with Supervision, v/c for safety and to decrease speed and look out for obstacles. Toilet transfer with CGA with RW, v/c for safety. Pt requests to purchase some snacks from vending machine, setup A for getting his cash and making snack selections. Pt requests to return to bed at end of session, CGA for squat pivot transfer. Sit to supine Supervision. Pt left semi-reclined in bed with needs in reach, ice pack to RLE at end of session, bed alarm in place.  Therapy Documentation Precautions:  Precautions Precautions: Fall, Cervical Precaution Booklet Issued: No Precaution Comments: Pt recalled RLE nonweightbearing  Required Braces or Orthoses: Cervical Brace, Other Brace Cervical Brace: Hard collar, At all times Other  Brace: right hinged knee brace (can be removed for ROM during day with therapy, needs to be locked in extension at night) Restrictions Weight Bearing Restrictions: Yes RLE Weight Bearing: Non weight bearing  Therapy/Group: Individual Therapy   Excell Seltzer, PT, DPT  06/16/2018, 3:38 PM

## 2018-06-16 NOTE — Progress Notes (Signed)
Occupational Therapy Session Note  Patient Details  Name: Richard Vasquez MRN: [id number] Date of Birth: 05/06/1960  Today's Date: 06/16/2018 OT Individual Time: 1594-5859 OT Individual Time Calculation (min): 57 min    Short Term Goals: Week 1:  OT Short Term Goal 1 (Week 1): Pt will complete toilet transfer wiht LRAD and S OT Short Term Goal 2 (Week 1): Pt will completes shower trnasfer wiht LRAD and S OT Short Term Goal 3 (Week 1): Pt will don pants wiht CGA OT Short Term Goal 4 (Week 1): pt will recall precautions wiht min question cues  Skilled Therapeutic Interventions/Progress Updates:    Upon entering the room, pt supine in bed with no c/o pain and agreeable to OT intervention. OT offered shower this session but pt declined and requested to complete tomorrow. OT changing hard collar pads while pt supine in bed secondary to pads appearing to be very soiled. Pt following directions to not move neck once hard collar was removed. Pt standing from bed with min guard and ambulating 20' to wheelchair with RW and maintaining NWB status. Pt managed B leg rests with min verbal cuing for technique. Pt propelled wheelchair 200' to tub room. OT demonstrated and educated pt on transfer onto TTB with use of RW. Pt returning demonstrations with min guard for balance. OT also recommended safety treads to decrease fall risk. Pt verbalized having a bed that sits likely on the floor and plans to use pull out sofa bed to sleep on. Pt reports sofa in apartment being similar height and demonstrated ability to transfer onto/off of with use of RW and min guard for safety. Pt returning to wheelchair and propelled back to room with supervision. Pt remained in chair with chair alarm belt donned and call bell within reach upon exiting the room.   Therapy Documentation Precautions:  Precautions Precautions: Fall, Cervical Precaution Booklet Issued: No Precaution Comments: Pt recalled RLE nonweightbearing  Required  Braces or Orthoses: Cervical Brace, Other Brace Cervical Brace: Hard collar, At all times Other Brace: right hinged knee brace (can be removed for ROM during day with therapy, needs to be locked in extension at night) Restrictions Weight Bearing Restrictions: Yes RLE Weight Bearing: Non weight bearing General:   Vital Signs: Therapy Vitals Temp: 98 F (36.7 C) Temp Source: Oral Pulse Rate: 64 Resp: 17 BP: 122/68 Patient Position (if appropriate): Lying Oxygen Therapy SpO2: 94 % O2 Device: Room Air Pain: Pain Assessment Pain Scale: 0-10 Pain Score: 6    Therapy/Group: Individual Therapy  [name] 06/16/2018, 8:58 AM
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
## 2018-06-16 NOTE — Progress Notes (Signed)
Physical Therapy Session Note  Patient Details  Name: Richard Vasquez MRN: [id number] Date of Birth: 15-Jan-1961  Today's Date: 06/16/2018 PT Individual Time: 2440-1027 PT Individual Time Calculation (min): 60 min   Short Term Goals: Week 1:  PT Short Term Goal 1 (Week 1): Patient to perform all transfers with S with safe technique.  PT Short Term Goal 2 (Week 1): Patient to ambulate 83' with RW and S with safe technique. PT Short Term Goal 3 (Week 1): Patient to demonstrate standing balance with 1 UE support with S for functional task.  Skilled Therapeutic Interventions/Progress Updates:   Pt received sitting in w/c and agreeable to therapy session. Pt able to verbalize c-collar precautions and R LE brace and WBing precautions with min cuing for not using R LE for balance during standing. Therapist noted c-collar loose and tightened for improved fit and reinforced education on not rotating neck inside collar. Performed B UE w/c propulsion >224ft independently throughout session with set-up assist for leg rests. Sit<>stand w/c<>RW with CGA for steadying throughout session. Pt adjusted LB clothing without UE support demonstrating resting R LE on floor for balance - reinforced education on maintaining NWB precautions during standing without UE support and provided min assist for balance. Ambulated ~35ft using RW with CGA/min assist for balance throughout and pt able to maintain NWB status in R LE. Pt requesting trial ambulation with crutches. Performed sit<>stand using crutches and ambulated ~36ft all with min assist for balance due to pt demonstrating increased unsteadiness - therapist educated pt on using RW for ambulation for increased safety and pt in agreement. Squat pivot w/c<>EOM no AD with close supervision for safety and pt maintaining NWB. Sit<>supine with supervision.   Performed the following supine R LE exercises 2x10 repetitions with cuing throughout for proper technique:  - SLR wearing  brace  - hip abduction/adduction wearing brace - heel slides without brace - short arch quads without brace with pt reporting increased pain and only performing 1 set of 10 (reapplied R leg brace in supine)  Performed B UE w/c propulsion ~156ft independently with set-up assist for leg rests. Ambulated ~12ft x2 to/from bathroom using RW with CGA/close supervision for steadying/safety and demonstrated ability to maintain NWB with minimal cuing. Performed LB clothing management and hand hygiene at sink with 1 UE support on RW/sink and min assist for balance while maintaining NWB in R LE. Pt continent of urine. Left sitting in w/c with needs in reach, seat belt alarm on, c-collar and R LE brace donned, and  R LE elevated.   Therapy Documentation Precautions:  Precautions Precautions: Fall, Cervical Precaution Booklet Issued: No Precaution Comments: Pt recalled RLE nonweightbearing  Required Braces or Orthoses: Cervical Brace, Other Brace Cervical Brace: Hard collar, At all times Other Brace: right hinged knee brace (can be removed for ROM during day with therapy, needs to be locked in extension at night) Restrictions Weight Bearing Restrictions: Yes RLE Weight Bearing: Non weight bearing  Pain: Reports 3-4/10 pain in R LE at beginning of session with increase to 6/10 during short arch quads and therapist deferred continuing that exercise with rest break provided for pain management.    Therapy/Group: Individual Therapy  Tawana Scale, PT, DPT 06/16/2018, 10:59 AM

## 2018-06-16 NOTE — Plan of Care (Signed)
  Problem: Consults Goal: RH SPINAL CORD INJURY PATIENT EDUCATION Description  See Patient Education module for education specifics.  Outcome: Progressing   Problem: SCI BLADDER ELIMINATION Goal: RH STG MANAGE BLADDER WITH ASSISTANCE Description STG Manage Bladder With mod I Assistance  Outcome: Progressing

## 2018-06-16 NOTE — Care Management (Signed)
[facility] Individual Statement of Services  Patient Name:  Richard Vasquez  Date:  06/16/2018  Welcome to the [facility].  Our goal is to provide you with an individualized program based on your diagnosis and situation, designed to meet your specific needs.  With this comprehensive rehabilitation program, you will be expected to participate in at least 3 hours of rehabilitation therapies Monday-Friday, with modified therapy programming on the weekends.  Your rehabilitation program will include the following services:  Physical Therapy (PT), Occupational Therapy (OT), 24 hour per day rehabilitation nursing, Therapeutic Recreaction (TR), Case Management (Social Worker), Rehabilitation Medicine, Nutrition Services and Pharmacy Services  Weekly team conferences will be held on Tuesdays to discuss your progress.  Your Social Worker will talk with you frequently to get your input and to update you on team discussions.  Team conferences with you and your family in attendance may also be held.  Expected length of stay: 10-14 days   Overall anticipated outcome: modified independent  Depending on your progress and recovery, your program may change. Your Social Worker will coordinate services and will keep you informed of any changes. Your Social Worker's name and contact numbers are listed  below.  The following services may also be recommended but are not provided by the [facility] will be made to provide these services after discharge if needed.  Arrangements include referral to agencies that provide these services.  Your insurance has been verified to be:  none Your primary doctor is:  none  Pertinent information will be shared with your doctor and your insurance company.  Social Worker:   [place], [place] or ([phone]   Information discussed with and copy given to patient by: Lennart Pall, 06/16/2018, 5:04 PM

## 2018-06-17 ENCOUNTER — Inpatient Hospital Stay (HOSPITAL_COMMUNITY): Payer: Self-pay

## 2018-06-17 ENCOUNTER — Inpatient Hospital Stay (HOSPITAL_COMMUNITY): Payer: Self-pay | Admitting: Occupational Therapy

## 2018-06-17 ENCOUNTER — Inpatient Hospital Stay (HOSPITAL_COMMUNITY): Payer: Self-pay | Admitting: Physical Therapy

## 2018-06-17 NOTE — Progress Notes (Signed)
Physical Therapy Session Note  Patient Details  Name: Richard Vasquez MRN: [id number] Date of Birth: 04-25-1960  Today's Date: 06/17/2018 PT Individual Time: 0800-0915 PT Individual Time Calculation (min): 75 min   Short Term Goals: Week 1:  PT Short Term Goal 1 (Week 1): Patient to perform all transfers with S with safe technique.  PT Short Term Goal 2 (Week 1): Patient to ambulate 74' with RW and S with safe technique. PT Short Term Goal 3 (Week 1): Patient to demonstrate standing balance with 1 UE support with S for functional task.  Skilled Therapeutic Interventions/Progress Updates:     Patient in bed upon PT arrival. Patient alert and agreeable to PT session. R hinge knee brace donned prior to and throughout PT session.   Therapeutic Activity: Bed Mobility: Patient performed supine to/from sit with supervision for safety without use of bed rails with bed flat.  Transfers: Patient performed sit to/from stand x4 and a toilet transfer x1 with supervision for safety with the RW and a car transfer with supervision-CGA using the RW. Provided verbal cues for hand placement, reaching back before sitting, and weight bearing precautions on R LE.  Gait Training:  Patient ambulated 12 feet to and from the bathroom using a RW with supervision for safety and 150 feet x1 using RW with CGA for safety and balance with w/c follow. Ambulated with hop-to gait pattern on L and NWB on R with occasional TTWB on R despite cues from therapist. Provided verbal cues for looking ahead, decreased speed for increased control and safety awareness, and cues for NWB on R.  Patient went up/down a ramp, over unlevel surfaces x10' with CGA for safety and cues for NWB on R LE and demonstration for technique.  He went up/down a single 6" step x2 using a RW with min A-CGA for safety and balance with cues for technique, control with mobility for safety, and NWB on R LE.   Wheelchair Mobility:  Patient propelled wheelchair  150 feet with supervision for safety. Provided verbal cues for reduced speed for safety awareness and using breaks and donning/doffing leg rest throughout session.  Therapeutic Exercise: Patient doffed and donned brace in long sitting with min A from therapist for ROM exercises. Patient performed the following exercises with verbal and tactile cues for proper technique. -R SLR with brace 2x10 -R heel slides 2x10 without brace  Patient in bed at end of session with breaks locked, bed alarm set, and all needs within reach.    Therapy Documentation Precautions:  Precautions Precautions: Fall, Cervical Precaution Booklet Issued: No Precaution Comments: Pt recalled RLE nonweightbearing  Required Braces or Orthoses: Cervical Brace, Other Brace Cervical Brace: Hard collar, At all times Other Brace: right hinged knee brace (can be removed for ROM during day with therapy, needs to be locked in extension at night) Restrictions Weight Bearing Restrictions: Yes RLE Weight Bearing: Non weight bearing Pain: Pain Assessment Pain Scale: 0-10 Pain Score: 4 Pain Location: R LE Pain Intervention: reposition; distraction    Therapy/Group: Individual Therapy  Richard Vasquez L Richard Vasquez PT, DPT  06/17/2018, 3:27 PM
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
## 2018-06-17 NOTE — Progress Notes (Signed)
Occupational Therapy Session Note  Patient Details  Name: Richard Vasquez MRN: [id number] Date of Birth: 1960/10/15  Today's Date: 06/17/2018 OT Individual Time: 1335-1415 OT Individual Time Calculation (min): 40 min    Short Term Goals: Week 1:  OT Short Term Goal 1 (Week 1): Pt will complete toilet transfer wiht LRAD and S OT Short Term Goal 2 (Week 1): Pt will completes shower trnasfer wiht LRAD and S OT Short Term Goal 3 (Week 1): Pt will don pants wiht CGA OT Short Term Goal 4 (Week 1): pt will recall precautions wiht min question cues  Skilled Therapeutic Interventions/Progress Updates:    Pt seen for OT session focusing on functional mobility and transfers. Pt sitting up in w/c upon arrival, denying pain and agreeable to tx session. He self propelled w/c throughout unit with supervision, VCs for safety awareness and effective propulsion technique. Completed functional ambulation obstacle course, ambulating with RW weaving through cones, min A initially progressing to supervision. Completed x4 trials with seated rest breaks btwn trials.  In ADL apartment, ambulated within apartment with supervision. Sit<>stand from low soft surface couch with supervision. Ambulated into tub room and completed simulated tub/shower transfer utilizing tub transfer bench with supervision and VCs for technique.  Pt returned to w/c and self-propelled w/c to family room. Instructed pt in how to make coffee using k-cups as pt requesting to do this independently btwn sessions. Strict instruction to complete entire task from w/c level for safety, pt voiced understanding. Pt returned to room in w/c. Voiced need for toileting task. Ambulated within room with close [place]. Toileting task in standing with supervision. Returned to sink and completed hand hygiene in standing.  Pt left seated in w/c at end of session, chair belt alarm on and all needs in reach. Pt impulsive with poor safety awareness throughout  session, however, is very appreciative and receptive to education and cuing.   Therapy Documentation Precautions:  Precautions Precautions: Fall, Cervical Precaution Booklet Issued: No Precaution Comments: Pt recalled RLE nonweightbearing  Required Braces or Orthoses: Cervical Brace, Other Brace Cervical Brace: Hard collar, At all times Other Brace: right hinged knee brace (can be removed for ROM during day with therapy, needs to be locked in extension at night) Restrictions Weight Bearing Restrictions: Yes RLE Weight Bearing: Non weight bearing   Therapy/Group: Individual Therapy  Armarion Greek L 06/17/2018, 7:14 AM

## 2018-06-17 NOTE — Progress Notes (Signed)
Physical Therapy Session Note  Patient Details  Name: Richard Vasquez MRN: [id number] Date of Birth: 05/18/1960  Today's Date: 06/17/2018 PT Individual Time: 1445-1530 PT Individual Time Calculation (min): 45 min   Short Term Goals: Week 1:  PT Short Term Goal 1 (Week 1): Patient to perform all transfers with S with safe technique.  PT Short Term Goal 2 (Week 1): Patient to ambulate 59' with RW and S with safe technique. PT Short Term Goal 3 (Week 1): Patient to demonstrate standing balance with 1 UE support with S for functional task.  Skilled Therapeutic Interventions/Progress Updates:    Pt received seated in w/c in room, agreeable to PT session. No complaints of pain. Manual w/c propulsion x 200 ft with setup A for management of w/c parts throughout session. Set pt up with 16x16 manual w/c for improved fit and biomechanics with propulsion. Per pt report he performed a car transfer earlier this date. Pt is able to perform transfer with CGA for balance and demos good memory of technique and safety awareness. Pt also able to recall that he can sit in backseat of car if he is not able to scoot back all the way into passenger seat to get RLE into car. Pt demos improved safety awareness throughout session and performs w/c mobility with decreased speed and increased awareness of surroundings. Provided pt with HEP of RLE ROM and strengthening exercises in seated and supine: heel slides, hip abd, SLR, SAQ, marches, LAQ. Pt requesting to return to bed at end of session. Stand pivot transfer w/c to bed with RW and Supervision. Sit to supine Supervision. Pt left semi-reclined in bed with needs in reach, bed alarm in place, ice pack to R knee.  Therapy Documentation Precautions:  Precautions Precautions: Fall, Cervical Precaution Booklet Issued: No Precaution Comments: Pt recalled RLE nonweightbearing  Required Braces or Orthoses: Cervical Brace, Other Brace Cervical Brace: Hard collar, At all  times Other Brace: right hinged knee brace (can be removed for ROM during day with therapy, needs to be locked in extension at night) Restrictions Weight Bearing Restrictions: Yes RLE Weight Bearing: Non weight bearing    Therapy/Group: Individual Therapy   Excell Seltzer, PT, DPT  06/17/2018, 4:12 PM

## 2018-06-18 ENCOUNTER — Inpatient Hospital Stay (HOSPITAL_COMMUNITY): Payer: Self-pay | Admitting: Occupational Therapy

## 2018-06-18 ENCOUNTER — Inpatient Hospital Stay (HOSPITAL_COMMUNITY): Payer: Self-pay

## 2018-06-18 MED ORDER — NICOTINE 7 MG/24HR TD PT24: 7.0000 mg | MEDICATED_PATCH | Freq: Every day | TRANSDERMAL | 0 refills | Status: DC

## 2018-06-18 MED ORDER — OXYCODONE HCL 5 MG PO TABS: 5.0000 mg | ORAL_TABLET | ORAL | 0 refills | Status: DC | PRN

## 2018-06-18 MED ORDER — FISH OIL 1000 MG PO CAPS: 1000.0000 mg | ORAL_CAPSULE | Freq: Two times a day (BID) | ORAL | 0 refills | Status: DC

## 2018-06-18 MED ORDER — ACETAMINOPHEN 325 MG PO TABS: 650.0000 mg | ORAL_TABLET | Freq: Four times a day (QID) | ORAL | Status: DC

## 2018-06-18 MED ORDER — DOCUSATE SODIUM 100 MG PO CAPS: 100.0000 mg | ORAL_CAPSULE | Freq: Two times a day (BID) | ORAL | 0 refills | Status: DC

## 2018-06-18 MED ORDER — ADULT MULTIVITAMIN W/MINERALS CH: 1.0000 | ORAL_TABLET | Freq: Every day | ORAL | Status: DC

## 2018-06-18 MED ORDER — VITAMIN C 1000 MG PO TABS: 1000.0000 mg | ORAL_TABLET | Freq: Two times a day (BID) | ORAL | 0 refills | Status: DC

## 2018-06-18 MED ORDER — METHOCARBAMOL 500 MG PO TABS: 500.0000 mg | ORAL_TABLET | Freq: Four times a day (QID) | ORAL | 0 refills | Status: DC | PRN

## 2018-06-18 MED ORDER — POLYETHYLENE GLYCOL 3350 17 G PO PACK: 17.0000 g | PACK | Freq: Every day | ORAL | 0 refills | Status: DC

## 2018-06-18 MED ORDER — GABAPENTIN 100 MG PO CAPS: 100.0000 mg | ORAL_CAPSULE | Freq: Three times a day (TID) | ORAL | 0 refills | Status: DC

## 2018-06-18 NOTE — Progress Notes (Signed)
Occupational Therapy Session Note  Patient Details  Name: TESHAUN OLARTE MRN: [id number] Date of Birth: May 12, 1960  Today's Date: 06/18/2018 OT Individual Time: 1245-1400 OT Individual Time Calculation (min): 75 min    Short Term Goals: Week 1:  OT Short Term Goal 1 (Week 1): Pt will complete toilet transfer wiht LRAD and S OT Short Term Goal 2 (Week 1): Pt will completes shower trnasfer wiht LRAD and S OT Short Term Goal 3 (Week 1): Pt will don pants wiht CGA OT Short Term Goal 4 (Week 1): pt will recall precautions wiht min question cues  Skilled Therapeutic Interventions/Progress Updates:    1:1. Pt greeted in w/c with no pain reported. Pt agreeable to shower in tub room to simulate at home. During session OT profusely educates pt on WB precautions, sequencing of TTB transfer, RW management, safety awareness, w/c parts management and kitchen management with no evidence of learning from pt. OT records safety issues featured in session in notebook for caregiver who will be trained tomorrow. During session pt propels w/c to/from all tx destinations with supervision for BUE strengthening and endurance. OT begins to educate on sequencing of TTB transfer, however pt hops up out of chair ambulates with weight on RLE to step over edge of tub. OT reinforces NWB RLE precaution will not be lifted until surgeon clears it and pt states, "well we've been working on bending it so I thought I could walk on it." Pt returns to w/c and completes TTB with adherence to NWB and transfers into TTB. (Per confirmation/approval from PA) pt doffs brace to shower. OT reinforces pt is not to stand without brace on and pt should use lateral leans to wash buttocks, however pt completes 3 sit to stands in shower stating "I'll just be done in a minute. Pt dons brace, socks and pants edge of TTB with VC for using 1 UE support on RW during clothing management (pt ignores cue). Pt completes UB dressing after lying supine on couch  for OT to chnge pads on C collar. OT provides pt with walker bag. Pt completes kitchen mobility with VC for RW management and safety awareness reaching in to various places for dynamic balance with supervision. Pt makes cup of coffee seated in w/c and transports back to room. Exited session with pt seated in w/c, belt alarm on, call light in reach and all needs met  Therapy Documentation Precautions:  Precautions Precautions: Fall, Cervical Precaution Booklet Issued: No Precaution Comments: Pt recalled RLE nonweightbearing  Required Braces or Orthoses: Cervical Brace, Other Brace Cervical Brace: Hard collar, At all times Other Brace: right hinged knee brace (can be removed for ROM during day with therapy, needs to be locked in extension at night) Restrictions Weight Bearing Restrictions: Yes RLE Weight Bearing: Non weight bearing General:   Vital Signs:   Pain: Pain Assessment Pain Scale: 0-10 Pain Score: 6  Pain Type: Acute pain;Surgical pain Pain Location: Knee Pain Orientation: Right Pain Descriptors / Indicators: Aching Pain Onset: On-going Patients Stated Pain Goal: 3 Pain Intervention(s): Medication (See eMAR) ADL:   Vision   Perception    Praxis   Exercises:   Other Treatments:     Therapy/Group: Individual Therapy  Tonny Branch 06/18/2018, 1:57 PM

## 2018-06-18 NOTE — Progress Notes (Signed)
Occupational Therapy Session Note  Patient Details  Name: Richard Vasquez MRN: [id number] Date of Birth: [birth year]  Today's Date: 06/18/2018 OT Individual Time: 0940-1010 OT Individual Time Calculation (min): 30 min    Short Term Goals: Week 1:  OT Short Term Goal 1 (Week 1): Pt will complete toilet transfer wiht LRAD and S OT Short Term Goal 2 (Week 1): Pt will completes shower trnasfer wiht LRAD and S OT Short Term Goal 3 (Week 1): Pt will don pants wiht CGA OT Short Term Goal 4 (Week 1): pt will recall precautions wiht min question cues  Skilled Therapeutic Interventions/Progress Updates:    1:1 Pt in w/c. Pt transferred stand pivot with supervision back to the bed. Practiced donning and doffing the right LE brace and ace wrapping his own LE for comfort. Pt able to complete this task with LE supported on bed with supervision. Discussed he would probably do this in long sitting on tub bench, with foot propped up on surface or the couch (not returning to a bed for "a while"_.  Brace with free ROM to continue to promote ROM in the day and then lock out at night. Perform knee exercises in sitting and in standing with RW for support.   Therapy Documentation Precautions:  Precautions Precautions: Fall, Cervical Precaution Booklet Issued: No Precaution Comments: Pt recalled RLE nonweightbearing  Required Braces or Orthoses: Cervical Brace, Other Brace Cervical Brace: Hard collar, At all times Other Brace: right hinged knee brace (can be removed for ROM during day with therapy, needs to be locked in extension at night) Restrictions Weight Bearing Restrictions: Yes RLE Weight Bearing: Non weight bearing Pain: No pain indicated    Therapy/Group: Individual Therapy  Willeen Cass [facility] 06/18/2018, 12:25 PM
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
## 2018-06-18 NOTE — Patient Care Conference (Signed)
Inpatient RehabilitationTeam Conference and Plan of Care Update Date: 06/16/2018   Time: 2:25 PM    Patient Name: Richard Vasquez      Medical Record Number: [id number]  Date of Birth: 1960/07/07 Sex: Male         Room/Bed: 4W12C/4W12C-01 Payor Info: Payor: MED PAY / Plan: MED PAY ASSURANCE / Product Type: *No Product type* /    Admitting Diagnosis: SCI Team  Closed TBI; 12-14days  Admit Date/Time:  06/12/2018  5:07 PM Admission Comments: No comment available   Primary Diagnosis:  <principal problem not specified> Principal Problem: <principal problem not specified>  Patient Active Problem List   Diagnosis Date Noted  . C2 cervical fracture ([place]) 06/12/2018  . Closed displaced supracondylar fracture of distal end of right femur with intracondylar extension ([place]) 06/10/2018    Expected Discharge Date: Expected Discharge Date: 06/20/18  Team Members Present: Physician leading conference: Dr. Alger Simons Social Worker Present: Lennart Pall, LCSW Nurse Present: Dwaine Gale, RN PT Present: Other (comment)(Taylor Ervin Knack, PT) OT Present: Amy Rounds, OT PPS Coordinator present : Gunnar Fusi     Current Status/Progress Goal Weekly Team Focus  Medical   tbi with polytrauma, cognitively intact  maximize functional use of RLE  pain mgt, wound care, nutrition   Bowel/Bladder   continent of bowel & bladder, LBM 06/15/18  remain continent  assist as needed & monitor   Swallow/Nutrition/ Hydration             ADL's   min A LB self care, min guard for standing balance and ambulation, set up A for UB self care  mod I overall with set up A for bathing and shower transfer  WB precautions, self care retraining, balance, functional mobility, strengthening   Mobility   Supervision to CGA bed mobility, CGA stand pivot transfers with RW, gait up to 80' RW CGA, Supervision w/c mobility  mod I overall  safety awareness, transfers, gait, w/c mobility   Communication              Safety/Cognition/ Behavioral Observations            Pain   c/o pain to right knee pain scale 6/10 3 times so far on night shift, has schedukled tylenol, neurontin, prn robaxin & oxycodone, had all  pain scale <4/10  assess & treat as needed   Skin   staples to scalp, sutuire to RLE incision, abrasions, has hinge brace on at all times  no new areas of skin break down, no signs of infection  assess q shift    Rehab Goals Patient on target to meet rehab goals: Yes *See Care Plan and progress notes for long and short-term goals.     Barriers to Discharge  Current Status/Progress Possible Resolutions Date Resolved   Physician    Medical stability        see medical progress notes      Nursing                  PT  Decreased caregiver support;Other (comments)  lives in a friend's basement who is on disability herself; baseline cognitive issues              OT                  SLP                SW  Discharge Planning/Teaching Needs:  Pt returning home with friends (living in their basement) who can provide intermittent support.  Teaching needs still TBD   Team Discussion:  Mild TBI, multiple fxs.  Good cognitive gains overall and only mild pain issues.  MD requests nsg work on loosening of scabs in order to allow staple removal.  Min-guard with ADLs and supervision - CGA with mobility.  Goals set for mod independent.  Poor safety awareness is concern.    Revisions to Treatment Plan:  NA    Continued Need for Acute Rehabilitation Level of Care: The patient requires daily medical management by a physician with specialized training in physical medicine and rehabilitation for the following conditions: Daily direction of a multidisciplinary physical rehabilitation program to ensure safe treatment while eliciting the highest outcome that is of practical value to the patient.: Yes Daily medical management of patient stability for increased activity during participation in  an intensive rehabilitation regime.: Yes Daily analysis of laboratory values and/or radiology reports with any subsequent need for medication adjustment of medical intervention for : Post surgical problems;Neurological problems   I attest that I was present, lead the team conference, and concur with the assessment and plan of the team.   Lennart Pall 06/18/2018, 8:53 AM    Team conference was held via web/ teleconference due to [place] - 19
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
## 2018-06-18 NOTE — Discharge Summary (Signed)
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 Cardiovascular normal rate and rhythm no friction rub or murmur heard Respiratory effort normal no respiratory distress without wheezes or rails GI soft no distention nontender without rebound Musculoskeletal right knee incision dressed and distal thigh  edema knee brace in place. Patient is alert and oriented no cranial nerve deficits upper extremities 5 out of 5.  Rehab course: During patient's stay in rehab weekly team conferences were held to monitor patient's progress, set goals and discuss barriers to discharge. At admission, patient required minimal assist ambulate 50 feet rolling walker, minimal assist sit to stand, minimal assist supine to sit.  Minimal assist upper body bathing and dressing max assist lower body bathing and dressing.  He  has had improvement in activity tolerance, balance, postural control as well as ability to compensate for deficits. He has had improvement in functional use RUE/LUE  and RLE/LLE as well as improvement in awareness.  Patient propels his wheelchair independently.  Independent management of parts.  Perform transfers with contact-guard assist for balance maintains weightbearing precautions.   Demonstrates improved safety awareness.  Ambulates within the apartment with supervision sit to stand from low soft surfaces ambulates into tub room completed simulated tub and transfers supervision.  Family teaching completed plan discharge to home       Disposition: Discharge disposition: 01-Home or Self Care     Discharged to home   Diet: Regular diet  Special Instructions: Nonweightbearing right lower extremity with hinged knee brace.  Cervical collar at all times  No driving smoking or alcohol  Remove scalp staples as outpatient with trauma services DR Lavone Neri THOMPSON [phone]  Medications at discharge 1.  Tylenol as needed 2.  Colace 100 mg p.o. twice daily 3.  Neurontin 100 mg p.o. 3 times daily 4.  Robaxin 500 mg every 6 hours as needed muscle spasms 5.  Multivitamin daily 6 NicoDerm patch taper as directed 7.  Oxycodone 1 to 2 tablets every 4 hours as needed pain 8.  Lovaza 1 g p.o. twice daily 9.  MiraLAX daily hold for loose stools 10.  Vitamin C 1000 mg p.o. daily 11.aspirin 325 mg daily  Discharge Instructions    Ambulatory referral to Physical Medicine Rehab   Complete by:  As directed    Moderate complexity follow-up 1 to 2 weeks right distal femur fracture as well as C2 cervical fracture      Follow-up Information    [name], MD Follow up.   Specialty:  Physical Medicine and Rehabilitation Why:  Office to call for appointment Contact information: [address] [place] [address] [phone]        Shona Needles, MD. Schedule an appointment as soon as possible for a visit in 1 week(s).   Specialty:  Orthopedic Surgery Why:   for repeat x-rays Contact information: [place] [address] [phone]        Earnie Larsson, MD Follow up.   Specialty:  Neurosurgery Why:  Call for appointment Contact information: [address] [place] [address] [phone]            Signed: Cathlyn Parsons 06/19/2018, 5:32 AM

## 2018-06-18 NOTE — Progress Notes (Signed)
Physical Therapy Session Note  Patient Details  Name: Richard Vasquez MRN: [id number] Date of Birth: 1960-03-18  Today's Date: 06/18/2018 PT Individual Time: 0800-0900 PT Individual Time Calculation (min): 60 min   Short Term Goals: Week 1:  PT Short Term Goal 1 (Week 1): Patient to perform all transfers with S with safe technique.  PT Short Term Goal 2 (Week 1): Patient to ambulate 82' with RW and S with safe technique. PT Short Term Goal 3 (Week 1): Patient to demonstrate standing balance with 1 UE support with S for functional task.  Skilled Therapeutic Interventions/Progress Updates:     Patient in bed on the phone upon PT arrival. Patient alert and agreeable to PT session.  Therapeutic Activity: Bed Mobility: Patient performed supine to/from sit independently with bed flat and without use of bed rails. Transfers: Patient performed a squat pivot transfer with supervision with cues to lock the breaks of the w/c before transferring. He performed sit to/from stand from the bed, w/c, and toilet with supervision to mod I. Performed peri-care and LB dressing independently during toileting.  Gait Training:  Patient ambulated 15 feet to and from the bathroom and 100 feet in the hallway using RW with supervision for safety. Ambulated with hop-to gait pattern on L with improved safety awareness this session. Provided verbal cues for controlled movements and NWB on R LE, did intermittent TTWB x2. PT educated on importance of NWB and stages of bone healing, patient appreciated the education and stated that he had a better understanding of the importance of keeping his weight off of his R LE.  Therapeutic Exercise: Patient performed the following exercises with verbal and tactile cues for proper technique. R  LE  ROM and strengthening exercises HEP: -heel slides 1x10 -hip abduction 1x10 -SLR 1x10 -SAQ 1x10 -marches 1x10 -LAQ 1x10 -w/c push-ups x5 educated on performing every 20 minutes while  in the w/c for pressure relief and UE strengthening.   Patient in w/c at end of session with breaks locked, seat belt alarm set, and all needs within reach.    Therapy Documentation Precautions:  Precautions Precautions: Fall, Cervical Precaution Booklet Issued: No Precaution Comments: Pt recalled RLE nonweightbearing  Required Braces or Orthoses: Cervical Brace, Other Brace Cervical Brace: Hard collar, At all times Other Brace: right hinged knee brace (can be removed for ROM during day with therapy, needs to be locked in extension at night) Restrictions Weight Bearing Restrictions: Yes RLE Weight Bearing: Non weight bearing Pain: Pain Assessment Pain Scale: 0-10 Pain Score: 4 Pain Location: R LE Pain Type: Acute pain;Surgical pain Pain Intervention(s): repositioned;distraction    Therapy/Group: Individual Therapy  Timothey Dahlstrom L Amato [place] PT, DPT  06/18/2018, 12:32 PM
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
## 2018-06-18 NOTE — Progress Notes (Signed)
Social Work Patient ID: Richard Vasquez, male   DOB: 09-16-1960, 58 y.o.   MRN: [id number]   Have reviewed team conference with pt and friend, [name].  Both aware and agreeable now with plan for pt to d/c end of day tomorrow. Have planned for [name] to be on unit by 1:30 in order to receive education from therapies prior to d/c.  Arranging Drew and DME.  Taela Charbonneau, LCSW

## 2018-06-19 ENCOUNTER — Inpatient Hospital Stay (HOSPITAL_COMMUNITY): Payer: Self-pay | Admitting: Occupational Therapy

## 2018-06-19 ENCOUNTER — Inpatient Hospital Stay (HOSPITAL_COMMUNITY): Payer: Self-pay

## 2018-06-19 LAB — CREATININE, SERUM
Creatinine, Ser: 0.62 mg/dL (ref 0.61–1.24)
GFR calc Af Amer: >60 mL/min (ref >60)
GFR calc non Af Amer: >60 mL/min (ref >60)

## 2018-06-19 MED ORDER — ASPIRIN EC 325 MG PO TBEC: 325.0000 mg | DELAYED_RELEASE_TABLET | Freq: Every day | ORAL | 3 refills | Status: DC

## 2018-06-19 NOTE — Progress Notes (Addendum)
Social Work  Discharge Note  The overall goal for the admission was met for:   Discharge location: Yes - home with friends who can provide intermittent support  Length of Stay: Yes - 7 days  Discharge activity level: Yes - modified independent to supervision  Home/community participation: Yes  Services provided included: MD, RD, PT, OT, SLP, RN, Pharmacy and [place]: uninsured - Financial counseling following  Follow-up services arranged: Home Health: RN, PT, OT via [facility], DME: 16x16 lightweight w/c with ELRs, cushion, rolling walker, 3n1 commode and tub bench via [place] and Patient/Family has no preference for HH/DME agencies Referred pt to [facility] program for med assistance and have provided information on the [facility] to access in [place] for primary medical care.  Comments (or additional information):      Contact info:  Pt @ [phone]        Friend, Kennieth Rad (pt lives with her) @ [phone]  Patient/Family verbalized understanding of follow-up arrangements: Yes  Individual responsible for coordination of the follow-up plan: pt  Confirmed correct DME delivered: Lennart Pall 06/19/2018    Shamiyah Ngu

## 2018-06-19 NOTE — Progress Notes (Signed)
Occupational Therapy Session Note  Patient Details  Name: Richard Vasquez MRN: [id number] Date of Birth: 1960/08/23  Today's Date: 06/19/2018 OT Individual Time: 1330-1420 OT Individual Time Calculation (min): 50 min    Short Term Goals: Week 1:  OT Short Term Goal 1 (Week 1): Pt will complete toilet transfer wiht LRAD and S OT Short Term Goal 2 (Week 1): Pt will completes shower trnasfer wiht LRAD and S OT Short Term Goal 3 (Week 1): Pt will don pants wiht CGA OT Short Term Goal 4 (Week 1): pt will recall precautions wiht min question cues  Skilled Therapeutic Interventions/Progress Updates:    Pt seen for OT session focusing on functional mobility and education. Pt sitting up in w/c upon arrival, desiring to go to bathroom. Scheduled caregiver education session, however, caregiver arrived with 5 minutes remaining in session. Education and demonstration provided to caregiver regarding pt's [place] pre-cautions, cervical pre-cautions, donning of hinged knee brace and need for lock out in extension at night and unhinged during the day. Also educated regarding recommendation for tub transfer bench and use. She is familiar with it from her own care. Recommendations for seated bathing task, supervision with transfers and  Orthosis/braces required in/out of shower. Care of cervical brace pads and donning technique. Pt's caregiver voiced understanding with all recommendations and understanding.  Upon therapist's arrival without caregiver present, pt ambulated with RW at mod I level. Occasional VCs required for adherence to NWB pre-cautions and difference btwn TDWB and non-WBing. Standing toileting task completed mod I as well as hand hygiene standing at sink. Pt returned to bed and education provided regarding cervical brace pads and changing of pads. Instruction provided for pt to direct caregiver with task.  Returned to sitting EOB and education provided regarding use of hinged knee brace and  lock/unlock features.  Pt's caregiver arrived and education as noted above. Pt left seated EOB at end of session with hand off to CSW.   Therapy Documentation Precautions:  Precautions Precautions: Fall, Cervical Precaution Booklet Issued: No Precaution Comments: Pt recalled RLE nonweightbearing  Required Braces or Orthoses: Cervical Brace, Other Brace Cervical Brace: Hard collar, At all times Other Brace: right hinged knee brace (can be removed for ROM during day with therapy, needs to be locked in extension at night) Restrictions Weight Bearing Restrictions: Yes RLE Weight Bearing: Non weight bearing   Therapy/Group: Individual Therapy  Richard Vasquez L 06/19/2018, 7:03 AM

## 2018-06-19 NOTE — Progress Notes (Signed)
Occupational Therapy Session Note  Patient Details  Name: Richard Vasquez MRN: [id number] Date of Birth: [birth year]  Today's Date: 06/19/2018 OT Individual Time: 1000-1040 OT Individual Time Calculation (min): 40 min   Short Term Goals: Week 1:  OT Short Term Goal 1 (Week 1): Pt will complete toilet transfer wiht LRAD and S OT Short Term Goal 2 (Week 1): Pt will completes shower trnasfer wiht LRAD and S OT Short Term Goal 3 (Week 1): Pt will don pants wiht CGA OT Short Term Goal 4 (Week 1): pt will recall precautions wiht min question cues      Skilled Therapeutic Interventions/Progress Updates:    Pt was greeted in w/c, requesting for pain medicine to address R LE pain. However RN states that he is already premedicated. He c/o decreased fit/comfort of Rt leg rest. Provided props and adjustments with pt reporting that this made his pain "so much better." He also requested to have wheelchair gloves to increase ease of w/c mobility at home, so OT provided these during session. We discussed w/c level home mgt tasks that he could help out with at home (I.e. washing counters, folding laundry) to stay active. He declined showering, stating that R LE pain increased after he took one yesterday. He opted instead to wash up at sink, and reported that he planned to mostly sponge bathe at home. Pt completed UB bathing with setup and declined to wash LB. Handwashing and oral care completed at Mod I level while seated. At end of tx pt was left in w/c with ice pack for R LE, safety belt fastened, and all needs within reach.   Therapy Documentation Precautions:  Precautions Precautions: Fall, Cervical Precaution Booklet Issued: No Precaution Comments: Pt recalled RLE nonweightbearing and cervical precautions Required Braces or Orthoses: Cervical Brace, Other Brace(R hinge knee brace) Cervical Brace: Hard collar, At all times Other Brace: right hinged knee brace (can be removed for ROM during day with  therapy, needs to be locked in extension at night) Restrictions Weight Bearing Restrictions: Yes RLE Weight Bearing: Non weight bearing Pain: Pain Assessment Pain Score: 5  Pain Type: Acute pain;Surgical pain Pain Location: Leg Pain Orientation: Right Pain Descriptors / Indicators: Aching;Constant;Sore Pain Onset: On-going Pain Intervention(s): Repositioned;Distraction(RN provided medication during session) ADL:       Therapy/Group: Individual Therapy  Analucia Hush A Karagan Lehr 06/19/2018, 2:48 PM

## 2018-06-19 NOTE — Progress Notes (Signed)
Physical Therapy Discharge Summary  Patient Details  Name: Richard Vasquez MRN: [id number] Date of Birth: Aug 24, 1960  Today's Date: 06/19/2018 PT Individual Time: 0800-0905 PT Individual Time Calculation (min): 65 min    Patient has met 9 of 10 long term goals due to improved activity tolerance, improved balance, improved postural control, increased strength, increased range of motion, decreased pain, ability to compensate for deficits, improved attention and improved awareness.  Patient to discharge at a wheelchair and ambulatory level Modified Independent.   Patient's care partner is independent to provide the necessary physical assistance at discharge.  He did not meet his ambulation goal of mod I for 150', but has ambulated this distance with supervision in previous sessions. Limited distance today for energy conservation for d/c this afternoon.   Recommendation:  Patient will benefit from ongoing skilled PT services in home health setting to continue to advance safe functional mobility, address ongoing impairments in balance, strength/ROM, functional mobility, cervical precautions and R NWB status, patient/family education, and minimize fall risk.  Equipment:  Patient was provided a 5" wheel RW and a 16"x16" w/c with standard seat cushion and B ELRs for PT DME. Reasons for discharge: treatment goals met  Patient/family agrees with progress made and goals achieved: Yes  Skilled PT Intervention:  In addition to the discharge assessment below, the patient performed the following PT interventions:  Patient in bed upon PT arrival. Patient alert and agreeable to PT session. Patient doffed hinge knee brace in long sitting in bed independently. PT demonstrated wrapping his knee with the ACE wrap for proper technique and patient was able to replicate wrap with min cuing from PT. Patient donned the hinge knee brace on his R LE and PT educated on locking the brace into 0 degrees of extension and  on wearing at all times, except for during exercises for ROM.   Therapeutic Activity: Bed Mobility: Patient performed supine to/from sit with independently in a flat bed without bed rails. Transfers: Patient performed sit to/from stand with a RW x6 and squat pivot x1 with mod I. Performed toileting including peri-care and LB dressing with mod I.  Gait Training:  Patient ambulated 15 feet to and from the bathroom and 50' with 1 90 degree and 1 180 degree turn using RW with mod I after initial cuing for NWB on R LE when patient was placing it on the ground at first, patient responded with "I'm not putting weight on it," however did maintain NWB after cues. Ambulated with hop-to gait pattern on L. Limited ambulation distance due to patient going home today. He ambulated over unlevel surfaces, up/down a ramp and a curb using the RW with supervision, provided cues for slowing down for more control for increased safety, however, patient continued to perform mobility with increased speed, stating "I have already done this, I know what I am doing."   Wheelchair Mobility:  Patient propelled wheelchair 200 feet with mod I and set up A for ELRs. Provided verbal cues for placement of ELRs. He used w/c breaks appropriately without cues throughout session.  Patient in w/c at end of session with breaks locked, seat belt alarm set, and all needs within reach. Educated patient about calling 911 in the event of a fall at home, HEP, pressure relief, fall risk, energy conservation, and reinforced wearing the c-collar and knee brace at all times, and patient was able to state cervical precautions and NWB on R LE. Patient stated he was planning of making several stops  on the way home and would be getting in and out of the care and needing to walk over a grassy area to "collect his things" this afternoon. PT educated on asking for his friend to complete some or all of the tasks to reduce the amount of getting in and out of the  car, and educated on the high risk of falling if ambulating long distances over grass and hills, stating it would not be safe and that he should have his friends take care of collecting his things.    PT Discharge Precautions/Restrictions Precautions Precautions: Fall;Cervical Required Braces or Orthoses: Cervical Brace;Other Brace(R hinge knee brace) Cervical Brace: Hard collar;At all times Other Brace: right hinged knee brace (can be removed for ROM during day with therapy, needs to be locked in extension at night) Restrictions Weight Bearing Restrictions: Yes RLE Weight Bearing: Non weight bearing Pain Pain Assessment Pain Scale: 0-10 Pain Score: 5  Pain Type: Acute pain;Surgical pain Pain Location: Leg Pain Orientation: Right Pain Descriptors / Indicators: Aching;Constant;Sore Pain Onset: On-going Pain Intervention(s): Repositioned;Distraction(RN provided medication during session) Multiple Pain Sites: No Vision/Perception  Perception Perception: Within Functional Limits Praxis Praxis: Intact  Cognition Overall Cognitive Status: No family/caregiver present to determine baseline cognitive functioning Arousal/Alertness: Awake/alert Orientation Level: Oriented X4 Attention: Focused;Sustained Focused Attention: Appears intact Sustained Attention: Impaired Sustained Attention Impairment: Verbal complex Memory: Impaired Memory Impairment: Decreased recall of new information Awareness: Impaired Awareness Impairment: verbal complex; functional complex Problem Solving: Impaired Problem Solving Impairment: verbal complex; Functional complex Safety/Judgment: Impaired Comments: requires cuing to maintain precautions, poor carry over of education, poor safety awareness and judgement Sensation Sensation Light Touch: Appears Intact Coordination Gross Motor Movements are Fluid and Coordinated: No Fine Motor Movements are Fluid and Coordinated: Yes Coordination and Movement  Description: Impaired 2/2 WBing restrictions and cervical precautions Motor  Motor Motor: Other (comment) Motor - Discharge Observations: limited by cervical fx with precautions and R femur fracture with NWB  Mobility Bed Mobility Bed Mobility: Rolling Right;Supine to Sit;Sitting - Scoot to Edge of Bed Rolling Right: Independent Supine to Sit: Independent Sitting - Scoot to [employer] of Bed: Independent Transfers Transfers: Sit to [employer] Transfers Sit to Stand: Independent with assistive device Stand to Sit: Independent with assistive device Stand Pivot Transfers: Independent with assistive device Transfer (Assistive device): Rolling walker Locomotion  Gait Ambulation: Yes Gait Assistance: Independent with assistive device Gait Distance (Feet): 150 Feet Assistive device: Rolling walker Gait Gait: Yes Gait Pattern: Decreased trunk rotation;Trunk flexed;Decreased step length - left(hop-to pattern on L, NWB on R) Gait velocity: decreased Stairs / Additional Locomotion Stairs: Yes Ramp: Supervision/Verbal cueing Curb: Engineer, maintenance (IT) Assistance: Independent with Camera operator: Both upper extremities Wheelchair Parts Management: Independent Distance: 200'  Trunk/Postural Assessment  Cervical Assessment Cervical Assessment: Exceptions to WFL(c-collar) Thoracic Assessment Thoracic Assessment: Within Functional Limits Lumbar Assessment Lumbar Assessment: Within Functional Limits Postural Control Postural Control: Deficits on evaluation(decreased due to R LE NWB)  Balance Balance Balance Assessed: Yes Dynamic Sitting Balance Dynamic Sitting - Balance Support: No upper extremity supported;Feet unsupported Dynamic Sitting - Level of Assistance: 7: Independent Dynamic Sitting - Balance Activities: Lateral lean/weight shifting;Forward lean/weight shifting;Reaching for objects Sitting  balance - Comments: during toileting- peri care and LB dressing Static Standing Balance Static Standing - Balance Support: Right upper extremity supported;Left upper extremity supported;During functional activity Static Standing - Level of Assistance: 6: Modified independent (Device/Increase time) Static Standing - Comment/# of Minutes: with RW during LB  dressing and washing hands at the sink Dynamic Standing Balance Dynamic Standing - Balance Support: Right upper extremity supported;Left upper extremity supported;During functional activity Dynamic Standing - Level of Assistance: 6: Modified independent (Device/Increase time) Dynamic Standing - Balance Activities: Lateral lean/weight shifting;Reaching for objects Extremity Assessment  RUE Assessment RUE Assessment: Within Functional Limits Active Range of Motion (AROM) Comments: WFL for functional mobility General Strength Comments: WFL for functional mobility with cervical precautions LUE Assessment LUE Assessment: Within Functional Limits Active Range of Motion (AROM) Comments: WFL for functional mobility General Strength Comments: WFL for functional mobility with cervical precautions RLE Assessment RLE Assessment: Exceptions to [facility] Active Range of Motion (AROM) Comments: knee flexion grossly 45 degrees knee flexion in hinged brace in sitting, knee extension WFL; ankle AROM WFL, hip flexion AAROM grossly [facility] General Strength Comments: ankle WFL, hip flexion 4+/5, knee extension at least 3/5, knee flexion at least 2+/5 within available range LLE Assessment LLE Assessment: Within Functional Limits Active Range of Motion (AROM) Comments: WFL for functional mobility General Strength Comments: WFL for functional mobility    Kriss Ishler L Amiee Wiley PT, DPT  06/19/2018, 12:39 PM

## 2018-06-19 NOTE — Progress Notes (Signed)
Pt discharge home with friend. Discharge instructions given by Linna Hoff, PA. No further question from patient or family. Belongings and equipments sent with pt. Pt stable at time of discharge.   Richard Vasquez W Dynasia Kercheval

## 2018-06-19 NOTE — Plan of Care (Signed)
  Problem: Consults Goal: RH SPINAL CORD INJURY PATIENT EDUCATION Description  See Patient Education module for education specifics.  Outcome: Progressing Goal: Skin Care Protocol Initiated - if Braden Score 18 or less Description If consults are not indicated, leave blank or document N/A Outcome: Progressing   Problem: SCI BOWEL ELIMINATION Goal: RH STG MANAGE BOWEL WITH ASSISTANCE Description STG Manage Bowel with mod I Assistance.  Outcome: Progressing Goal: RH STG SCI MANAGE BOWEL WITH MEDICATION WITH ASSISTANCE Description STG SCI Manage bowel with medication with mod I assistance.  Outcome: Progressing   Problem: SCI BLADDER ELIMINATION Goal: RH STG MANAGE BLADDER WITH ASSISTANCE Description STG Manage Bladder With mod I Assistance  Outcome: Progressing   Problem: RH SKIN INTEGRITY Goal: RH STG SKIN FREE OF INFECTION/BREAKDOWN Description Patients skin will remain free from further infection or breakdown with min assist.  Outcome: Progressing Goal: RH STG MAINTAIN SKIN INTEGRITY WITH ASSISTANCE Description STG Maintain Skin Integrity With min Assistance.  Outcome: Progressing Goal: RH STG ABLE TO PERFORM INCISION/WOUND CARE W/ASSISTANCE Description STG Able To Perform Incision/Wound Care With total Assistance from caregiver.  Outcome: Progressing   Problem: RH SAFETY Goal: RH STG ADHERE TO SAFETY PRECAUTIONS W/ASSISTANCE/DEVICE Description STG Adhere to Safety Precautions With Mod I  Assistance/Device.  Outcome: Progressing   Problem: RH PAIN MANAGEMENT Goal: RH STG PAIN MANAGED AT OR BELOW PT'S PAIN GOAL Description < 4  Outcome: Progressing

## 2018-06-19 NOTE — Progress Notes (Signed)
Occupational Therapy Discharge Summary  Patient Details  Name: Richard Vasquez MRN: [id number] Date of Birth: 01-02-1961   Patient has met 10 of 10 long term goals due to improved activity tolerance, improved balance, postural control, ability to compensate for deficits, improved attention, improved awareness and improved coordination.  Patient to discharge at overall Supervision- mod I level.  Patient's care partner is independent to provide the necessary physical assistance at discharge.  Pt lives alone in basement of friend's home. His friend is present intermittently and she has completed hands on caregiver training specifically regarding pt's [place] pre-cautions, donning/doffing and care for knee brace and cervical collar. Recommending supervision for tub/shower transfer utilizing tub bench and during bathing tasks. Pt with history of cognitive impairments and at times with difficulty recalling pre-cautions and adhering to them in functional context.    Recommendation:  Patient will benefit from ongoing skilled OT services in home health setting to continue to advance functional skills in the area of BADL, iADL and Reduce care partner burden.  Equipment: Tub transfer bench and BSC  Reasons for discharge: treatment goals met and discharge from hospital  Patient/family agrees with progress made and goals achieved: Yes  OT Discharge Precautions/Restrictions  Precautions Precautions: Fall;Cervical Required Braces or Orthoses: Cervical Brace;Other Brace(R hinge knee brace) Cervical Brace: Hard collar;At all times Other Brace: right hinged knee brace (can be removed for ROM during day with therapy, needs to be locked in extension at night) Restrictions Weight Bearing Restrictions: Yes RLE Weight Bearing: Non weight bearing Vision Baseline Vision/History: Wears glasses Wears Glasses: Reading only Patient Visual Report: No change from baseline Vision Assessment?: No apparent visual  deficits Perception  Perception: Within Functional Limits Praxis Praxis: Intact Cognition Overall Cognitive Status: No family/caregiver present to determine baseline cognitive functioning Arousal/Alertness: Awake/alert Orientation Level: Oriented X4 Attention: Focused;Sustained Focused Attention: Appears intact Sustained Attention: Impaired Sustained Attention Impairment: Verbal complex;Functional complex Memory: Impaired Memory Impairment: Decreased recall of new information;Decreased short term memory Decreased Short Term Memory: Verbal basic;Functional basic Awareness: Impaired Awareness Impairment: Emergent impairment Problem Solving: Impaired Problem Solving Impairment: Verbal complex;Functional complex Reasoning: Impaired Reasoning Impairment: Verbal complex;Functional complex Safety/Judgment: Impaired Comments: Poor carry over of education, difficulty recalling and maintaining pre-cautions, poor safety awareness and functional implications Sensation Sensation Light Touch: Appears Intact Coordination Gross Motor Movements are Fluid and Coordinated: No Fine Motor Movements are Fluid and Coordinated: Yes Coordination and Movement Description: Impaired 2/2 WBing restrictions Motor  Motor Motor: Other (comment) Motor - Discharge Observations: limited by cervical fx with precautions and R femur fracture with NWB Trunk/Postural Assessment  Cervical Assessment Cervical Assessment: Exceptions to WFL(Cervical brace AAT) Thoracic Assessment Thoracic Assessment: Within Functional Limits Lumbar Assessment Lumbar Assessment: Within Functional Limits Postural Control Postural Control: Deficits on evaluation(Impaired 2/2 WBing pre-cautions)  Balance Balance Balance Assessed: Yes Dynamic Sitting Balance Dynamic Sitting - Balance Support: No upper extremity supported Dynamic Sitting - Level of Assistance: 7: Independent Static Standing Balance Static Standing - Balance  Support: Right upper extremity supported;Left upper extremity supported;During functional activity Static Standing - Level of Assistance: 6: Modified independent (Device/Increase time) Static Standing - Comment/# of Minutes: standg at [facility] Dynamic Standing Balance Dynamic Standing - Balance Support: Right upper extremity supported;Left upper extremity supported;During functional activity Dynamic Standing - Level of Assistance: 6: Modified independent (Device/Increase time) Dynamic Standing - Comments: Standing to complete LB dressing and toileting tasks Extremity/Trunk Assessment RUE Assessment RUE Assessment: Within Functional Limits General Strength Comments: WFL for ADLs and functional mobility with cervical  precautions LUE Assessment LUE Assessment: Within Functional Limits General Strength Comments: WFL for ADLs and functional mobility with cervical precautions LUE Body System: Ortho   Tremont Gavitt L 06/19/2018, 12:19 PM
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
## 2018-06-19 NOTE — Discharge Instructions (Signed)
Inpatient Rehab Discharge Instructions  Richard Vasquez Discharge date and time: No discharge date for patient encounter.   Activities/Precautions/ Functional Status: Activity: Nonweightbearing right lower extremity with hinged knee brace.  Cervical collar at all times Diet: Regular Wound Care: Routine skin checks Functional status:  ___ No restrictions     ___ Walk up steps independently ___ 24/7 supervision/assistance   ___ Walk up steps with assistance ___ Intermittent supervision/assistance  ___ Bathe/dress independently ___ Walk with walker     _x__ Bathe/dress with assistance ___ Walk Independently    ___ Shower independently ___ Walk with assistance    ___ Shower with assistance ___ No alcohol     ___ Return to work/school ________     COMMUNITY REFERRALS UPON DISCHARGE:    Home Health:   PT     OT    RN                      Agency:  [facility]     Phone: [phone]   Medical Equipment/Items Ordered: wheelchair, cushion, walker, commode and tub bench                                                     Agency/Supplier:  [employer] @ [phone]   Recommend that you follow up with [facility] to establish for primary medical care (forms enclosed)   Special Instructions: No driving smoking or alcohol Call DR Georganna Skeans [phone] for removal of scalp staples   My questions have been answered and I understand these instructions. I will adhere to these goals and the provided educational materials after my discharge from the hospital.  Patient/Caregiver Signature _______________________________ Date __________  Clinician Signature _______________________________________ Date __________  Please bring this form and your medication list with you to all your follow-up doctor's appointments.

## 2018-06-23 ENCOUNTER — Other Ambulatory Visit: Payer: Self-pay

## 2018-06-23 ENCOUNTER — Telehealth: Payer: Self-pay

## 2018-06-23 NOTE — Telephone Encounter (Signed)
  TRANSITIONAL CARE CALL  Patient name: Richard Vasquez [id number])  DOB: (02-17-1960)      1. Are you/is patient experiencing any problems since coming home? (NO)  a. Are there any questions regarding any aspect of care? (NA)   2. Are there any questions regarding medications administration/dosing? (NO)  a. Are meds being taken as prescribed? (YES)   3. Have there been any falls? (NO)   4. Has Home Health been to the house and/or have they contacted you? (NO)  a. If not, have you tried to contact them? (NO)  b. Can we help you contact them? (YES)   5. Are bowels and bladder emptying properly? (YES)  a. Are there any unexpected incontinence issues? (NO)  b. If applicable, is patient following bowel/bladder programs? (NA)   6. Any fevers, problems with breathing, or unexpected pain? (NO)    7. Are there any skin problems or new areas of breakdown? (NO)   8. Has the patient/family member arranged specialty MD follow up? (ie, cardiology/neuro) (YES)  a. Can we help arrange? (NA)   9. Does the patient need any other services or support that we can help arrange? (NO)   10. Are caregivers following through as expected in assisting the patient? (YES)   11. Has the patient quit smoking, drinking alcohol, or using drugs as recommended? ()   Appointment date/time: (07-01-2018 / 140pm)  Arrive time: (120pm)  With: Zella Ball first, then Dr. Naaman Plummer)   [place] and [place]   [phone]

## 2018-06-23 NOTE — Telephone Encounter (Signed)
Pt has a substance abuse history. Will need to come in to see eunice for a visit. Was given #30 oxycodone on 5/21 and is already out

## 2018-06-23 NOTE — Telephone Encounter (Signed)
completed a transitional care call to patient, they stated everything is doing good so far but patient is still having issues with pain and have requested a refill of patients oxycodone.

## 2018-06-24 NOTE — Telephone Encounter (Signed)
Patients notified that his medications will be discussed at his appt.

## 2018-06-25 ENCOUNTER — Telehealth: Payer: Self-pay | Admitting: *Deleted

## 2018-06-25 NOTE — Telephone Encounter (Signed)
Kennieth Rad [facility]) called to request refill of Mr Hadden's pain medication.  By PMP it was filled 06/19/18 #30 tablets.  He has a return appt 06/30/28 with Zella Ball.  Please advise.

## 2018-06-26 MED ORDER — OXYCODONE HCL 5 MG PO TABS: 5.0000 mg | ORAL_TABLET | Freq: Four times a day (QID) | ORAL | 0 refills | Status: DC | PRN

## 2018-06-26 NOTE — Telephone Encounter (Signed)
Notified. I told him he was to be taking only sparingly and to be weaning off the medication because it will not be refilled again. He asked about his nerve medication. He has about 3 days left. I informed him it was just filled 06/18/18 and he said "[name] felt like it was ot strong enough" so apparently he has been taking more than prescribed. I informed him that he is not to increase any medication above what is ordered by the physician and the medication was a 30 day supply.  He said 'Yes mam". He is to bring his bottles to his appt 6/3.

## 2018-06-26 NOTE — Telephone Encounter (Signed)
He may have #30. rx sent.  Please see my last response to this question. thx

## 2018-06-29 ENCOUNTER — Telehealth: Payer: Self-pay | Admitting: Registered Nurse

## 2018-06-29 NOTE — Telephone Encounter (Signed)
I have let Darlene at [place] at home  for the [facility] to remove staples at next visit.

## 2018-06-29 NOTE — Telephone Encounter (Signed)
Patient need to call Dr. Doreatha Martin office for F/U appointment.
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
## 2018-06-29 NOTE — Telephone Encounter (Signed)
Patient states he left hospital 2 weeks ago - home health looked at staples and said they felt they should be removed --- can you let them know what to do

## 2018-06-30 ENCOUNTER — Encounter: Payer: Self-pay | Admitting: Student

## 2018-07-01 ENCOUNTER — Encounter: Payer: Self-pay | Attending: Registered Nurse | Admitting: Registered Nurse

## 2018-07-01 ENCOUNTER — Other Ambulatory Visit: Payer: Self-pay

## 2018-07-01 DIAGNOSIS — M792 Neuralgia and neuritis, unspecified: Secondary | ICD-10-CM

## 2018-07-01 DIAGNOSIS — S12191S Other nondisplaced fracture of second cervical vertebra, sequela: Secondary | ICD-10-CM
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
## 2018-07-05 ENCOUNTER — Encounter: Payer: Self-pay | Admitting: Registered Nurse

## 2018-07-08 ENCOUNTER — Other Ambulatory Visit: Payer: Self-pay

## 2018-07-08 ENCOUNTER — Encounter: Payer: Self-pay | Admitting: Medical Oncology

## 2018-07-08 ENCOUNTER — Emergency Department
Admission: EM | Admit: 2018-07-08 | Discharge: 2018-07-08 | Disposition: A | Discharge status: Other Acute Inpatient Hospital | Payer: Medicaid Other | Attending: Emergency Medicine | Admitting: Emergency Medicine

## 2018-07-08 ENCOUNTER — Inpatient Hospital Stay
Admission: AD | Admit: 2018-07-08 | Discharge: 2018-07-14 | DRG: 885 | Disposition: A | Discharge status: Home / Self Care | Payer: No Typology Code available for payment source | Attending: Psychiatry | Admitting: Psychiatry

## 2018-07-08 DIAGNOSIS — F29 Unspecified psychosis not due to a substance or known physiological condition: Secondary | ICD-10-CM | POA: Diagnosis present

## 2018-07-08 DIAGNOSIS — F17210 Nicotine dependence, cigarettes, uncomplicated: Secondary | ICD-10-CM | POA: Diagnosis present

## 2018-07-08 DIAGNOSIS — Z590 Homelessness: Secondary | ICD-10-CM | POA: Diagnosis not present

## 2018-07-08 DIAGNOSIS — F419 Anxiety disorder, unspecified: Secondary | ICD-10-CM | POA: Diagnosis present

## 2018-07-08 DIAGNOSIS — N400 Enlarged prostate without lower urinary tract symptoms: Secondary | ICD-10-CM | POA: Diagnosis present

## 2018-07-08 DIAGNOSIS — R45851 Suicidal ideations: Secondary | ICD-10-CM | POA: Diagnosis present

## 2018-07-08 DIAGNOSIS — F209 Schizophrenia, unspecified: Principal | ICD-10-CM | POA: Diagnosis present

## 2018-07-08 DIAGNOSIS — Z1159 Encounter for screening for other viral diseases: Secondary | ICD-10-CM

## 2018-07-08 DIAGNOSIS — D649 Anemia, unspecified: Secondary | ICD-10-CM | POA: Diagnosis present

## 2018-07-08 DIAGNOSIS — G8929 Other chronic pain: Secondary | ICD-10-CM | POA: Diagnosis present

## 2018-07-08 DIAGNOSIS — S12100D Unspecified displaced fracture of second cervical vertebra, subsequent encounter for fracture with routine healing: Secondary | ICD-10-CM | POA: Diagnosis not present

## 2018-07-08 DIAGNOSIS — Z20828 Contact with and (suspected) exposure to other viral communicable diseases: Secondary | ICD-10-CM | POA: Insufficient documentation

## 2018-07-08 DIAGNOSIS — Z7982 Long term (current) use of aspirin: Secondary | ICD-10-CM

## 2018-07-08 DIAGNOSIS — R4182 Altered mental status, unspecified: Secondary | ICD-10-CM | POA: Diagnosis present

## 2018-07-08 DIAGNOSIS — I10 Essential (primary) hypertension: Secondary | ICD-10-CM | POA: Diagnosis present

## 2018-07-08 DIAGNOSIS — G4700 Insomnia, unspecified: Secondary | ICD-10-CM | POA: Diagnosis present

## 2018-07-08 DIAGNOSIS — G3184 Mild cognitive impairment, so stated: Secondary | ICD-10-CM | POA: Diagnosis present

## 2018-07-08 DIAGNOSIS — Z79899 Other long term (current) drug therapy: Secondary | ICD-10-CM | POA: Diagnosis not present

## 2018-07-08 DIAGNOSIS — F203 Undifferentiated schizophrenia: Secondary | ICD-10-CM

## 2018-07-08 DIAGNOSIS — S72461A Displaced supracondylar fracture with intracondylar extension of lower end of right femur, initial encounter for closed fracture: Secondary | ICD-10-CM | POA: Diagnosis present

## 2018-07-08 DIAGNOSIS — S12191S Other nondisplaced fracture of second cervical vertebra, sequela: Secondary | ICD-10-CM | POA: Diagnosis not present

## 2018-07-08 DIAGNOSIS — S12100A Unspecified displaced fracture of second cervical vertebra, initial encounter for closed fracture: Secondary | ICD-10-CM | POA: Diagnosis present

## 2018-07-08 LAB — COMPREHENSIVE METABOLIC PANEL
ALT: 12 U/L (ref 0–44)
AST: 20 U/L (ref 15–41)
Albumin: 3.9 g/dL (ref 3.5–5.0)
Alkaline Phosphatase: 77 U/L (ref 38–126)
Anion gap: 10 (ref 5–15)
BUN: 9 mg/dL (ref 6–20)
CO2: 23 mmol/L (ref 22–32)
Calcium: 9.6 mg/dL (ref 8.9–10.3)
Chloride: 111 mmol/L (ref 98–111)
Creatinine, Ser: 0.61 mg/dL (ref 0.61–1.24)
GFR calc Af Amer: >60 mL/min (ref >60)
GFR calc non Af Amer: >60 mL/min (ref >60)
Glucose, Bld: 104 mg/dL — ABNORMAL HIGH (ref 70–99)
Potassium: 3.5 mmol/L (ref 3.5–5.1)
Sodium: 144 mmol/L (ref 135–145)
Total Bilirubin: 0.5 mg/dL (ref 0.3–1.2)
Total Protein: 7.5 g/dL (ref 6.5–8.1)

## 2018-07-08 LAB — URINE DRUG SCREEN, QUALITATIVE (ARMC ONLY)
Amphetamines, Ur Screen: NOT DETECTED
Barbiturates, Ur Screen: NOT DETECTED
Benzodiazepine, Ur Scrn: POSITIVE — AB
Cannabinoid 50 Ng, Ur ~~LOC~~: NOT DETECTED
Cocaine Metabolite,Ur ~~LOC~~: NOT DETECTED
MDMA (Ecstasy)Ur Screen: NOT DETECTED
Methadone Scn, Ur: NOT DETECTED
Opiate, Ur Screen: NOT DETECTED
Phencyclidine (PCP) Ur S: NOT DETECTED
Tricyclic, Ur Screen: POSITIVE — AB

## 2018-07-08 LAB — CBC
HCT: 35.4 % — ABNORMAL LOW (ref 39.0–52.0)
Hemoglobin: 11.8 g/dL — ABNORMAL LOW (ref 13.0–17.0)
MCH: 31.1 pg (ref 26.0–34.0)
MCHC: 33.3 g/dL (ref 30.0–36.0)
MCV: 93.4 fL (ref 80.0–100.0)
Platelets: 302 10*3/uL (ref 150–400)
RBC: 3.79 MIL/uL — ABNORMAL LOW (ref 4.22–5.81)
RDW: 12.8 % (ref 11.5–15.5)
WBC: 9.5 10*3/uL (ref 4.0–10.5)
nRBC: 0 % (ref 0.0–0.2)

## 2018-07-08 LAB — SARS CORONAVIRUS 2 BY RT PCR (HOSPITAL ORDER, PERFORMED IN ~~LOC~~ HOSPITAL LAB): SARS Coronavirus 2: NEGATIVE

## 2018-07-08 LAB — ETHANOL: Alcohol, Ethyl (B): <10 mg/dL (ref <10)

## 2018-07-08 LAB — TSH: TSH: 1.642 u[IU]/mL (ref 0.350–4.500)

## 2018-07-08 LAB — HEMOGLOBIN A1C
Hgb A1c MFr Bld: 5.2 % (ref 4.8–5.6)
Mean Plasma Glucose: 102.54 mg/dL

## 2018-07-08 LAB — LIPID PANEL
Cholesterol: 218 mg/dL — ABNORMAL HIGH (ref 0–200)
HDL: 45 mg/dL (ref >40)
LDL Cholesterol: 166 mg/dL — ABNORMAL HIGH (ref 0–99)
Total CHOL/HDL Ratio: 4.8 RATIO
Triglycerides: 37 mg/dL (ref <150)
VLDL: 7 mg/dL (ref 0–40)

## 2018-07-08 MED ORDER — QUETIAPINE FUMARATE 100 MG PO TABS
100.0000 mg | ORAL_TABLET | Freq: Every day | ORAL | Status: DC
  Administered 2018-07-09 – 2018-07-10 (×2): 100 mg via ORAL
  Filled 2018-07-08 (×2): qty 1

## 2018-07-08 MED ORDER — OMEGA-3-ACID ETHYL ESTERS 1 G PO CAPS
1.0000 g | ORAL_CAPSULE | Freq: Every day | ORAL | Status: DC
  Administered 2018-07-09 – 2018-07-14 (×6): 1 g via ORAL
  Filled 2018-07-08 (×6): qty 1

## 2018-07-08 MED ORDER — GABAPENTIN 100 MG PO CAPS: 100.0000 mg | ORAL_CAPSULE | Freq: Three times a day (TID) | ORAL | Status: DC

## 2018-07-08 MED ORDER — DOCUSATE SODIUM 100 MG PO CAPS
100.0000 mg | ORAL_CAPSULE | Freq: Two times a day (BID) | ORAL | Status: DC
  Administered 2018-07-10 – 2018-07-14 (×6): 100 mg via ORAL
  Filled 2018-07-08 (×10): qty 1

## 2018-07-08 MED ORDER — OLANZAPINE 5 MG PO TBDP
10.0000 mg | ORAL_TABLET | Freq: Three times a day (TID) | ORAL | Status: DC | PRN
  Administered 2018-07-11: 10 mg via ORAL
  Filled 2018-07-08 (×3): qty 2

## 2018-07-08 MED ORDER — MAGNESIUM HYDROXIDE 400 MG/5ML PO SUSP
30.0000 mL | Freq: Every day | ORAL | Status: DC | PRN
  Filled 2018-07-08: qty 30

## 2018-07-08 MED ORDER — METHOCARBAMOL 500 MG PO TABS: 500.0000 mg | ORAL_TABLET | Freq: Four times a day (QID) | ORAL | Status: DC | PRN

## 2018-07-08 MED ORDER — LORAZEPAM 1 MG PO TABS
1.0000 mg | ORAL_TABLET | ORAL | Status: AC | PRN
  Administered 2018-07-09: 1 mg via ORAL
  Filled 2018-07-08: qty 1

## 2018-07-08 MED ORDER — ACETAMINOPHEN 500 MG PO TABS: 1000.0000 mg | ORAL_TABLET | Freq: Four times a day (QID) | ORAL | Status: DC | PRN

## 2018-07-08 MED ORDER — METHOCARBAMOL 500 MG PO TABS
500.0000 mg | ORAL_TABLET | Freq: Four times a day (QID) | ORAL | Status: DC | PRN
  Filled 2018-07-08: qty 1

## 2018-07-08 MED ORDER — ACETAMINOPHEN 500 MG PO TABS
1000.0000 mg | ORAL_TABLET | Freq: Once | ORAL | Status: AC
  Administered 2018-07-08: 1000 mg via ORAL
  Filled 2018-07-08: qty 2

## 2018-07-08 MED ORDER — GABAPENTIN 100 MG PO CAPS
100.0000 mg | ORAL_CAPSULE | Freq: Three times a day (TID) | ORAL | Status: DC
  Administered 2018-07-08: 100 mg via ORAL
  Filled 2018-07-08 (×3): qty 1

## 2018-07-08 MED ORDER — QUETIAPINE FUMARATE 25 MG PO TABS
50.0000 mg | ORAL_TABLET | Freq: Two times a day (BID) | ORAL | Status: DC
  Administered 2018-07-08: 14:00:00 50 mg via ORAL
  Filled 2018-07-08: qty 2

## 2018-07-08 MED ORDER — NICOTINE 7 MG/24HR TD PT24
7.0000 mg | MEDICATED_PATCH | Freq: Every day | TRANSDERMAL | Status: DC
  Administered 2018-07-10 – 2018-07-14 (×5): 7 mg via TRANSDERMAL
  Filled 2018-07-08 (×5): qty 1

## 2018-07-08 MED ORDER — ZIPRASIDONE MESYLATE 20 MG IM SOLR
20.0000 mg | INTRAMUSCULAR | Status: AC | PRN
  Administered 2018-07-10: 20 mg via INTRAMUSCULAR
  Filled 2018-07-08: qty 20

## 2018-07-08 MED ORDER — ALUM & MAG HYDROXIDE-SIMETH 200-200-20 MG/5ML PO SUSP: 30.0000 mL | ORAL | Status: DC | PRN

## 2018-07-08 MED ORDER — VITAMIN C 500 MG PO TABS
1000.0000 mg | ORAL_TABLET | Freq: Two times a day (BID) | ORAL | Status: DC
  Administered 2018-07-10 – 2018-07-14 (×9): 1000 mg via ORAL
  Filled 2018-07-08 (×12): qty 2

## 2018-07-08 MED ORDER — ASPIRIN EC 325 MG PO TBEC: 325.0000 mg | DELAYED_RELEASE_TABLET | Freq: Every day | ORAL | Status: DC

## 2018-07-08 MED ORDER — OXYCODONE HCL 5 MG PO TABS
5.0000 mg | ORAL_TABLET | Freq: Four times a day (QID) | ORAL | Status: DC | PRN
  Administered 2018-07-08 – 2018-07-14 (×6): 5 mg via ORAL
  Filled 2018-07-08 (×6): qty 1

## 2018-07-08 MED ORDER — POLYETHYLENE GLYCOL 3350 17 G PO PACK
17.0000 g | PACK | Freq: Every day | ORAL | Status: DC
  Administered 2018-07-11 – 2018-07-14 (×3): 17 g via ORAL
  Filled 2018-07-08 (×3): qty 1

## 2018-07-08 MED ORDER — ADULT MULTIVITAMIN W/MINERALS CH
1.0000 | ORAL_TABLET | Freq: Every day | ORAL | Status: DC
  Administered 2018-07-10 – 2018-07-14 (×5): 1 via ORAL
  Filled 2018-07-08 (×6): qty 1

## 2018-07-08 MED ORDER — ACETAMINOPHEN 325 MG PO TABS
650.0000 mg | ORAL_TABLET | Freq: Four times a day (QID) | ORAL | Status: DC | PRN
  Administered 2018-07-10: 650 mg via ORAL

## 2018-07-08 MED ORDER — HYDROXYZINE HCL 25 MG PO TABS
25.0000 mg | ORAL_TABLET | Freq: Three times a day (TID) | ORAL | Status: DC | PRN
  Administered 2018-07-08 – 2018-07-09 (×2): 25 mg via ORAL
  Filled 2018-07-08 (×2): qty 1

## 2018-07-08 MED ORDER — GABAPENTIN 100 MG PO CAPS
100.0000 mg | ORAL_CAPSULE | Freq: Three times a day (TID) | ORAL | Status: DC
  Administered 2018-07-09 – 2018-07-11 (×6): 100 mg via ORAL
  Filled 2018-07-08 (×7): qty 1
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
## 2018-07-08 NOTE — Plan of Care (Signed)
Patient newly admitted, hasn't had time to progress.   Problem: Education: Goal: Knowledge of [facility] Education information/materials will improve Outcome: Not Progressing   Problem: Safety: Goal: Periods of time without injury will increase Outcome: Not Progressing   Problem: Education: Goal: Utilization of techniques to improve thought processes will improve Outcome: Not Progressing Goal: Knowledge of the prescribed therapeutic regimen will improve Outcome: Not Progressing   Problem: Safety: Goal: Ability to disclose and discuss suicidal ideas will improve Outcome: Not Progressing Goal: Ability to identify and utilize support systems that promote safety will improve Outcome: Not Progressing

## 2018-07-08 NOTE — ED Notes (Signed)
Pt given meal tray.

## 2018-07-08 NOTE — BH Assessment (Signed)
Patient is to be admitted to [facility] by Dr. Leverne Humbles.  Attending Physician will be Dr. Weber Cooks.   Patient has been assigned to room 322, by [place].   Intake Paper Work has been signed and placed on patient chart.  ER staff is aware of the admission:  Lattie Haw, ER Secretary    Dr. Cinda Quest, ER MD   Berton Lan, Patient's Nurse   Butch Penny, Patient Access.
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
## 2018-07-08 NOTE — ED Triage Notes (Signed)
Pt from home via ems- reports from pts roommate that pt became agitated yesterday and has been acting strange. Pt yelling in hall and was aggressive with EMS. EMS gave 10mg  Haldol and 50mg  benadryl per MD order pta.
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
## 2018-07-09 DIAGNOSIS — F203 Undifferentiated schizophrenia: Secondary | ICD-10-CM

## 2018-07-09 MED ORDER — IBUPROFEN 600 MG PO TABS
600.0000 mg | ORAL_TABLET | ORAL | Status: AC
  Administered 2018-07-09: 600 mg via ORAL
  Filled 2018-07-09: qty 1

## 2018-07-09 MED ORDER — TRAMADOL HCL 50 MG PO TABS
50.0000 mg | ORAL_TABLET | ORAL | Status: AC
  Administered 2018-07-09: 50 mg via ORAL
  Filled 2018-07-09: qty 1

## 2018-07-09 MED ORDER — OLANZAPINE 5 MG PO TBDP
5.0000 mg | ORAL_TABLET | Freq: Two times a day (BID) | ORAL | Status: DC
  Administered 2018-07-09 – 2018-07-14 (×11): 5 mg via ORAL
  Filled 2018-07-09 (×11): qty 1

## 2018-07-09 MED ORDER — ASPIRIN EC 81 MG PO TBEC
325.0000 mg | DELAYED_RELEASE_TABLET | Freq: Every day | ORAL | Status: DC
  Administered 2018-07-09 – 2018-07-13 (×4): 325 mg via ORAL
  Administered 2018-07-14: 324 mg via ORAL
  Filled 2018-07-09: qty 1
  Filled 2018-07-09 (×6): qty 5
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
## 2018-07-09 NOTE — Plan of Care (Signed)
Pt stated slept "pretty good". Pt rated depression 0/10 and anxiety 5/10. Pt denies SI, HI and AVH. Pt has a goal "to take one day at a time". Pt was somewhat uncooperative. Pt was educated on care plan and verbalizes understanding. Collier Bullock RN Problem: Education: Goal: Freight forwarder Education information/materials will improve Outcome: Not Progressing   Problem: Safety: Goal: Periods of time without injury will increase Outcome: Not Progressing   Problem: Education: Goal: Utilization of techniques to improve thought processes will improve Outcome: Not Progressing Goal: Knowledge of the prescribed therapeutic regimen will improve Outcome: Not Progressing   Problem: Safety: Goal: Ability to disclose and discuss suicidal ideas will improve Outcome: Not Progressing Goal: Ability to identify and utilize support systems that promote safety will improve Outcome: Not Progressing
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
## 2018-07-09 NOTE — [facility]HH Suicide Risk Assessment (Signed)
[facility] Admission Suicide Risk Assessment   Nursing information obtained from:  Patient Demographic factors:  Male Current Mental Status:  NA Loss Factors:  NA Historical Factors:  NA Risk Reduction Factors:  NA  Total Time spent with patient: 1 hour Principal Problem: <principal problem not specified> Diagnosis:  Active Problems:   Schizophrenia ([place])  Subjective Data: Patient who reports a history of schizophrenia came to the emergency room very disorganized and agitated.  Responded to medication and was calmer by the time I saw him.  Continues to report hallucinations telling him both suicidal and homicidal directions.  Does not report an actual wish to kill himself but feels confused and sad and anxious.  Continued Clinical Symptoms:  Alcohol Use Disorder Identification Test Final Score (AUDIT): 0 The "Alcohol Use Disorders Identification Test", Guidelines for Use in Primary Care, Second Edition.  World Pharmacologist [facility]). Score between 0-7:  no or low risk or alcohol related problems. Score between 8-15:  moderate risk of alcohol related problems. Score between 16-19:  high risk of alcohol related problems. Score 20 or above:  warrants further diagnostic evaluation for alcohol dependence and treatment.   CLINICAL FACTORS:   Schizophrenia:   Command hallucinatons   Musculoskeletal: Strength & Muscle Tone: decreased Gait & Station: unsteady Patient leans: N/A  Psychiatric Specialty Exam: Physical Exam  Nursing note and vitals reviewed. Constitutional: He appears well-developed and well-nourished.  HENT:  Head: Normocephalic and atraumatic.  Eyes: Pupils are equal, round, and reactive to light. Conjunctivae are normal.  Neck: Normal range of motion.  Cardiovascular: Regular rhythm and normal heart sounds.  Respiratory: Effort normal.  GI: Soft.  Musculoskeletal: Normal range of motion.  Neurological: He is alert.  Skin: Skin is warm and dry.  Psychiatric: His  mood appears anxious. His affect is labile. His speech is tangential. He is agitated and actively hallucinating. He is not aggressive. Thought content is paranoid. Cognition and memory are impaired. He expresses inappropriate judgment. He expresses homicidal and suicidal ideation. He expresses no suicidal plans and no homicidal plans.    Review of Systems  Constitutional: Negative.   HENT: Negative.   Eyes: Negative.   Respiratory: Negative.   Cardiovascular: Negative.   Gastrointestinal: Negative.   Musculoskeletal: Positive for joint pain and neck pain.  Skin: Negative.   Neurological: Negative.   Psychiatric/Behavioral: Positive for depression, hallucinations, memory loss and suicidal ideas. Negative for substance abuse. The patient is nervous/anxious and has insomnia.     Blood pressure (!) 122/93, pulse (!) 103, temperature 98.1 F (36.7 C), temperature source Oral, resp. rate 16, height 5\' 10"  (1.778 m), weight 63 kg, SpO2 100 %.Body mass index is 19.93 kg/m.  General Appearance: Disheveled  Eye Contact:  Fair  Speech:  Slow  Volume:  Decreased  Mood:  Dysphoric  Affect:  Congruent  Thought Process:  Disorganized  Orientation:  Full (Time, Place, and Person)  Thought Content:  Illogical, Hallucinations: Auditory, Rumination and Tangential  Suicidal Thoughts:  Yes.  without intent/plan  Homicidal Thoughts:  Yes.  without intent/plan  Memory:  Immediate;   Fair Recent;   Poor Remote;   Poor  Judgement:  Fair  Insight:  Fair  Psychomotor Activity:  Decreased  Concentration:  Concentration: Poor  Recall:  Poor  Fund of Knowledge:  Poor  Language:  Poor  Akathisia:  No  Handed:  Right  AIMS (if indicated):     Assets:  Desire for Improvement  ADL's:  Impaired  Cognition:  Impaired,  Mild  Sleep:  Number of Hours: 3.25      COGNITIVE FEATURES THAT CONTRIBUTE TO RISK:  Closed-mindedness    SUICIDE RISK:   Moderate:  Frequent suicidal ideation with limited  intensity, and duration, some specificity in terms of plans, no associated intent, good self-control, limited dysphoria/symptomatology, some risk factors present, and identifiable protective factors, including available and accessible social support.  PLAN OF CARE: 15-minute checks.  Restart psychiatric medicine.  Form some rapport and get to know better what he needs to help with his current situation.  Work on making sure we have outpatient treatment firmly in place prior to discharge  I certify that inpatient services furnished can reasonably be expected to improve the patient's condition.   Alethia Berthold, MD 07/09/2018, 4:46 PM
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
## 2018-07-09 NOTE — Progress Notes (Signed)
Recreation Therapy Notes  Date: 07/09/2018  Time: 9:30 am   Location: Craft room   Behavioral response: N/A   Intervention Topic: Values  Discussion/Intervention: Patient did not attend group.   Clinical Observations/Feedback:  Patient did not attend group.   Robyne Matar LRT/CTRS        Kensli Bowley 07/09/2018 11:17 AM

## 2018-07-09 NOTE — Progress Notes (Signed)
Pt was yelling in his room. I went down to check on him. He was kneeling on his bed asking forgiveness to God. When meds were due pt  refused. Collier Bullock RN
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
## 2018-07-09 NOTE — BHH Group Notes (Signed)
LCSW Group Therapy Note  07/09/2018 2:15 PM  Type of Therapy/Topic:  Group Therapy:  Balance in Life  Participation Level:  Minimal  Description of Group:    This group will address the concept of balance and how it feels and looks when one is unbalanced. Patients will be encouraged to process areas in their lives that are out of balance and identify reasons for remaining unbalanced. Facilitators will guide patients in utilizing problem-solving interventions to address and correct the stressor making their life unbalanced. Understanding and applying boundaries will be explored and addressed for obtaining and maintaining a balanced life. Patients will be encouraged to explore ways to assertively make their unbalanced needs known to significant others in their lives, using other group members and facilitator for support and feedback.  Therapeutic Goals: 1. Patient will identify two or more emotions or situations they have that consume much of in their lives. 2. Patient will identify signs/triggers that life has become out of balance:  3. Patient will identify two ways to set boundaries in order to achieve balance in their lives:  4. Patient will demonstrate ability to communicate their needs through discussion and/or role plays  Summary of Patient Progress: Pt was present in group and was respectful to other group members. Pt reported that to gain more balance in life, he would want to get a job and to start going to church more frequently. Pt did not engage much in the group discussion after that.    Therapeutic Modalities:   Cognitive Behavioral Therapy Solution-Focused Therapy Assertiveness Training  Evalina Field, MSW, LCSW Clinical Social Work 07/09/2018 2:15 PM

## 2018-07-10 NOTE — Progress Notes (Signed)
Recreation Therapy Notes  Date: 07/10/2018  Time: 9:30 am  Location: Craft room  Behavioral response: Appropriate  Intervention Topic: Problem Solving  Discussion/Intervention:  Group content on today was focused on problem solving. The group described what problem solving is. Patients expressed how problems affect them and how they deal with problems. Individuals identified healthy ways to deal with problems. Patients explained what normally happens to them when they do not deal with problems. The group expressed reoccurring problems for them. The group participated in the intervention "Ways to Solve problems" where patients were given a chance to explore different ways to solve problems.  Clinical Observations/Feedback:  Patient came to group and was focused on what peers and staff had to say about problem solving. Individual was social with peers and staff while participating in the intervention. Ahmadou Bolz LRT/CTRS         Hani Patnode 07/10/2018 11:16 AM

## 2018-07-10 NOTE — Plan of Care (Signed)
Patient is pleasant and compliant with medication administration. Patient yelling in the middle of night from his room. See MAR    Problem: Education: Goal: Knowledge of [facility] Education information/materials will improve Outcome: Not Progressing   Problem: Safety: Goal: Periods of time without injury will increase Outcome: Not Progressing   Problem: Education: Goal: Utilization of techniques to improve thought processes will improve Outcome: Not Progressing Goal: Knowledge of the prescribed therapeutic regimen will improve Outcome: Not Progressing   Problem: Education: Goal: Knowledge of [facility] Education information/materials will improve Outcome: Not Progressing   Problem: Safety: Goal: Ability to disclose and discuss suicidal ideas will improve Outcome: Not Progressing Goal: Ability to identify and utilize support systems that promote safety will improve Outcome: Not Progressing

## 2018-07-10 NOTE — Progress Notes (Signed)
[facility] MD Progress Note  07/10/2018 5:53 PM Richard Vasquez  MRN:  [id number] Subjective: Follow-up patient with schizophrenia.  Patient seen and he met with treatment team.  Patient had no new complaints and in fact that he was feeling better.  Hallucinations were much decreased.  Had no suicidal or homicidal ideation.  He was able to be around other patients and interact with others without getting irritable or losing his temper.  He has pain in his leg which he is tolerating okay.  He has some discomfort in his neck.  I tried calling neurosurgery today and was not able to get a call back from anyone on neurosurgical consults.  I am going to take the safer option and request that he continue his neck collar for today. Principal Problem: Schizophrenia ([place]) Diagnosis: Principal Problem:   Schizophrenia ([place]) Active Problems:   Closed displaced supracondylar fracture of distal end of right femur with intracondylar extension ([place])   C2 cervical fracture ([place])  Total Time spent with patient: 30 minutes  Past Psychiatric History: Past history of schizophrenia with hospitalizations  Past Medical History:  Past Medical History:  Diagnosis Date  . BPH (benign prostatic hyperplasia)   . Chronic pain   . Closed fracture of distal end of right femur ([place]) 06/09/2018    Past Surgical History:  Procedure Laterality Date  . ORIF FEMUR FRACTURE Right 06/10/2018   Procedure: OPEN REDUCTION INTERNAL FIXATION (ORIF) DISTAL FEMUR FRACTURE;  Surgeon: Shona Needles, MD;  Location: [facility];  Service: Orthopedics;  Laterality: Right;   Family History: History reviewed. No pertinent family history. Family Psychiatric  History: See previous Social History:  Social History   Substance and Sexual Activity  Alcohol Use Not Currently     Social History   Substance and Sexual Activity  Drug Use Not Currently    Social History   Socioeconomic History  . Marital status: Divorced    Spouse name: Not on file   . Number of children: Not on file  . Years of education: Not on file  . Highest education level: Not on file  Occupational History  . Not on file  Social Needs  . Financial resource strain: Not on file  . Food insecurity    Worry: Not on file    Inability: Not on file  . Transportation needs    Medical: Not on file    Non-medical: Not on file  Tobacco Use  . Smoking status: Current Every Day Smoker    Packs/day: 2.00    Years: 20.00    Pack years: 40.00    Types: Cigarettes  . Smokeless tobacco: Never Used  Substance and Sexual Activity  . Alcohol use: Not Currently  . Drug use: Not Currently  . Sexual activity: Not on file  Lifestyle  . Physical activity    Days per week: 5 days    Minutes per session: 100 min  . Stress: Very much  Relationships  . Social Herbalist on phone: Not on file    Gets together: Not on file    Attends religious service: Not on file    Active member of club or organization: Not on file    Attends meetings of clubs or organizations: Not on file    Relationship status: Not on file  Other Topics Concern  . Not on file  Social History Narrative  . Not on file   Additional Social History:    Pain Medications: See [facility]  Prescriptions: See MAR Over the Counter: See MAR History of alcohol / drug use?: (None Reported) Longest period of sobriety (when/how long): Myer Haff Negative Consequences of Use: Myer Haff) Withdrawal Symptoms: Myer Haff)                    Sleep: Fair  Appetite:  Fair  Current Medications: Current Facility-Administered Medications  Medication Dose Route Frequency Provider Last Rate Last Dose  . acetaminophen (TYLENOL) tablet 650 mg  650 mg Oral Q6H PRN Lavella Hammock, MD      . alum & mag hydroxide-simeth (MAALOX/MYLANTA) 200-200-20 MG/5ML suspension 30 mL  30 mL Oral Q4H PRN Lavella Hammock, MD      . aspirin EC tablet 325 mg  325 mg Oral Daily Sophie Tamez, Madie Reno, MD   325 mg at 07/09/18 1456  . docusate sodium  (COLACE) capsule 100 mg  100 mg Oral BID Lavella Hammock, MD   100 mg at 07/10/18 1716  . gabapentin (NEURONTIN) capsule 100 mg  100 mg Oral TID Lavella Hammock, MD   100 mg at 07/10/18 1716  . hydrOXYzine (ATARAX/VISTARIL) tablet 25 mg  25 mg Oral TID PRN Lavella Hammock, MD   25 mg at 07/09/18 2127  . magnesium hydroxide (MILK OF MAGNESIA) suspension 30 mL  30 mL Oral Daily PRN Lavella Hammock, MD      . methocarbamol (ROBAXIN) tablet 500 mg  500 mg Oral Q6H PRN Lavella Hammock, MD      . multivitamin with minerals tablet 1 tablet  1 tablet Oral Daily Lavella Hammock, MD   1 tablet at 07/10/18 0902  . nicotine (NICODERM CQ - dosed in mg/24 hr) patch 7 mg  7 mg Transdermal Daily Lavella Hammock, MD   7 mg at 07/10/18 0905  . OLANZapine zydis (ZYPREXA) disintegrating tablet 10 mg  10 mg Oral Q8H PRN Lavella Hammock, MD      . OLANZapine zydis [facility]) disintegrating tablet 5 mg  5 mg Oral BID Kalei Meda, Madie Reno, MD   5 mg at 07/10/18 0902  . omega-3 acid ethyl esters (LOVAZA) capsule 1 g  1 g Oral Daily Lavella Hammock, MD   1 g at 07/10/18 0908  . oxyCODONE (Oxy IR/ROXICODONE) immediate release tablet 5 mg  5 mg Oral Q6H PRN Lavella Hammock, MD   5 mg at 07/10/18 1717  . polyethylene glycol (MIRALAX / GLYCOLAX) packet 17 g  17 g Oral Daily Lavella Hammock, MD      . QUEtiapine (SEROQUEL) tablet 100 mg  100 mg Oral QHS Lavella Hammock, MD   100 mg at 07/09/18 2127  . vitamin C (ASCORBIC ACID) tablet 1,000 mg  1,000 mg Oral BID Lavella Hammock, MD   1,000 mg at 07/10/18 1717    Lab Results: No results found for this or any previous visit (from the past 48 hour(s)).  Blood Alcohol level:  Lab Results  Component Value Date   ETH <10 07/08/2018   ETH <10 11/57/2620    Metabolic Disorder Labs: Lab Results  Component Value Date   HGBA1C 5.2 07/08/2018   MPG 102.54 07/08/2018   No results found for: PROLACTIN Lab Results  Component Value Date   CHOL 218 (H) 07/08/2018   TRIG 37  07/08/2018   HDL 45 07/08/2018   CHOLHDL 4.8 07/08/2018   VLDL 7 07/08/2018   LDLCALC 166 (H) 07/08/2018    Physical Findings: AIMS:  , ,  ,  ,  CIWA:    COWS:     Musculoskeletal: Strength & Muscle Tone: within normal limits Gait & Station: normal Patient leans: N/A  Psychiatric Specialty Exam: Physical Exam  Nursing note and vitals reviewed. Constitutional: He appears well-developed and well-nourished.  HENT:  Head: Normocephalic and atraumatic.  Eyes: Pupils are equal, round, and reactive to light. Conjunctivae are normal.  Neck: Normal range of motion.  Cardiovascular: Regular rhythm and normal heart sounds.  Respiratory: Effort normal.  GI: Soft.  Musculoskeletal: Normal range of motion.  Neurological: He is alert.  Skin: Skin is warm and dry.  Psychiatric: He has a normal mood and affect. His speech is delayed. He is slowed. Thought content is not paranoid. Cognition and memory are normal. He expresses no homicidal and no suicidal ideation.    Review of Systems  Constitutional: Negative.   HENT: Negative.   Eyes: Negative.   Respiratory: Negative.   Cardiovascular: Negative.   Gastrointestinal: Negative.   Musculoskeletal: Negative.   Skin: Negative.   Neurological: Negative.   Psychiatric/Behavioral: Negative for depression, hallucinations, substance abuse and suicidal ideas. The patient is not nervous/anxious.     Blood pressure (!) 122/93, pulse (!) 103, temperature 98.1 F (36.7 C), temperature source Oral, resp. rate 16, height '5\' 10"'  (1.778 m), weight 63 kg, SpO2 100 %.Body mass index is 19.93 kg/m.  General Appearance: Casual  Eye Contact:  Good  Speech:  Normal Rate  Volume:  Normal  Mood:  Euthymic  Affect:  Constricted  Thought Process:  Goal Directed  Orientation:  Full (Time, Place, and Person)  Thought Content:  Logical  Suicidal Thoughts:  No  Homicidal Thoughts:  No  Memory:  Immediate;   Fair Recent;   Fair Remote;   Fair   Judgement:  Fair  Insight:  Fair  Psychomotor Activity:  Normal  Concentration:  Concentration: Fair  Recall:  [employer] of Knowledge:  Fair  Language:  Fair  Akathisia:  Negative  Handed:  Right  AIMS (if indicated):     Assets:  Desire for Improvement  ADL's:  Impaired  Cognition:  Impaired,  Mild  Sleep:  Number of Hours: 4.5     Treatment Plan Summary: Daily contact with patient to assess and evaluate symptoms and progress in treatment, Medication management and Plan Continue antipsychotic medication.  Continue pain medication and treatment for his injuries.  I attempted to call neurosurgery to get a consult or verbal description of what to do but no one is returning my call.  We will put in an order to ask him to keep the neck brace on for now.  Alethia Berthold, MD 07/10/2018, 5:53 PM

## 2018-07-10 NOTE — Tx Team (Addendum)
Interdisciplinary Treatment and Diagnostic Plan Update  07/10/2018 Time of Session: 230 PM Richard Vasquez MRN: [id number]  Principal Diagnosis: Schizophrenia [facility])  Secondary Diagnoses: Principal Problem:   Schizophrenia ([place]) Active Problems:   Closed displaced supracondylar fracture of distal end of right femur with intracondylar extension ([place])   C2 cervical fracture ([place])   Current Medications:  Current Facility-Administered Medications  Medication Dose Route Frequency Provider Last Rate Last Dose  . acetaminophen (TYLENOL) tablet 650 mg  650 mg Oral Q6H PRN Lavella Hammock, MD      . alum & mag hydroxide-simeth (MAALOX/MYLANTA) 200-200-20 MG/5ML suspension 30 mL  30 mL Oral Q4H PRN Lavella Hammock, MD      . aspirin EC tablet 325 mg  325 mg Oral Daily Clapacs, Madie Reno, MD   325 mg at 07/09/18 1456  . docusate sodium (COLACE) capsule 100 mg  100 mg Oral BID Lavella Hammock, MD      . gabapentin (NEURONTIN) capsule 100 mg  100 mg Oral TID Lavella Hammock, MD   100 mg at 07/10/18 0901  . hydrOXYzine (ATARAX/VISTARIL) tablet 25 mg  25 mg Oral TID PRN Lavella Hammock, MD   25 mg at 07/09/18 2127  . magnesium hydroxide (MILK OF MAGNESIA) suspension 30 mL  30 mL Oral Daily PRN Lavella Hammock, MD      . methocarbamol (ROBAXIN) tablet 500 mg  500 mg Oral Q6H PRN Lavella Hammock, MD      . multivitamin with minerals tablet 1 tablet  1 tablet Oral Daily Lavella Hammock, MD   1 tablet at 07/10/18 0902  . nicotine (NICODERM CQ - dosed in mg/24 hr) patch 7 mg  7 mg Transdermal Daily Lavella Hammock, MD   7 mg at 07/10/18 0905  . OLANZapine zydis (ZYPREXA) disintegrating tablet 10 mg  10 mg Oral Q8H PRN Lavella Hammock, MD      . OLANZapine zydis [facility]) disintegrating tablet 5 mg  5 mg Oral BID Clapacs, Madie Reno, MD   5 mg at 07/10/18 0902  . omega-3 acid ethyl esters (LOVAZA) capsule 1 g  1 g Oral Daily Lavella Hammock, MD   1 g at 07/10/18 0908  . oxyCODONE (Oxy IR/ROXICODONE)  immediate release tablet 5 mg  5 mg Oral Q6H PRN Lavella Hammock, MD   5 mg at 07/09/18 2127  . polyethylene glycol (MIRALAX / GLYCOLAX) packet 17 g  17 g Oral Daily Lavella Hammock, MD      . QUEtiapine (SEROQUEL) tablet 100 mg  100 mg Oral QHS Lavella Hammock, MD   100 mg at 07/09/18 2127  . vitamin C (ASCORBIC ACID) tablet 1,000 mg  1,000 mg Oral BID Lavella Hammock, MD   1,000 mg at 07/10/18 5366   PTA Medications: Medications Prior to Admission  Medication Sig Dispense Refill Last Dose  . acetaminophen (TYLENOL) 325 MG tablet Take 2 tablets (650 mg total) by mouth every 6 (six) hours.     . Ascorbic Acid (VITAMIN C) 1000 MG tablet Take 1 tablet (1,000 mg total) by mouth 2 (two) times daily. 60 tablet 0   . aspirin EC 325 MG tablet Take 1 tablet (325 mg total) by mouth daily. 100 tablet 3   . docusate sodium (COLACE) 100 MG capsule Take 1 capsule (100 mg total) by mouth 2 (two) times daily. 10 capsule 0   . gabapentin (NEURONTIN) 100 MG capsule Take 1 capsule (100  mg total) by mouth 3 (three) times daily. 90 capsule 0   . methocarbamol (ROBAXIN) 500 MG tablet Take 1 tablet (500 mg total) by mouth every 6 (six) hours as needed for muscle spasms. 60 tablet 0   . Multiple Vitamin (MULTIVITAMIN WITH MINERALS) TABS tablet Take 1 tablet by mouth daily.     . nicotine (NICODERM CQ - DOSED IN MG/24 HR) 7 mg/24hr patch Place 1 patch (7 mg total) onto the skin daily. 28 patch 0   . Omega-3 Fatty Acids (FISH OIL) 1000 MG CAPS Take 1 capsule (1,000 mg total) by mouth 2 (two) times daily. 60 capsule 0   . oxyCODONE (OXY IR/ROXICODONE) 5 MG immediate release tablet Take 1 tablet (5 mg total) by mouth every 6 (six) hours as needed for severe pain. 30 tablet 0   . polyethylene glycol (MIRALAX / GLYCOLAX) 17 g packet Take 17 g by mouth daily. 14 each 0     Patient Stressors: Occupational concerns Traumatic event  Patient Strengths: Motivation for treatment/growth Supportive  family/friends  Treatment Modalities: Medication Management, Group therapy, Case management,  1 to 1 session with clinician, Psychoeducation, Recreational therapy.   Physician Treatment Plan for Primary Diagnosis: Schizophrenia ([place]) Long Term Goal(s): Improvement in symptoms so as ready for discharge Improvement in symptoms so as ready for discharge   Short Term Goals: Ability to disclose and discuss suicidal ideas Ability to demonstrate self-control will improve Compliance with prescribed medications will improve  Medication Management: Evaluate patient's response, side effects, and tolerance of medication regimen.  Therapeutic Interventions: 1 to 1 sessions, Unit Group sessions and Medication administration.  Evaluation of Outcomes: Progressing  Physician Treatment Plan for Secondary Diagnosis: Principal Problem:   Schizophrenia ([place]) Active Problems:   Closed displaced supracondylar fracture of distal end of right femur with intracondylar extension ([place])   C2 cervical fracture ([place])  Long Term Goal(s): Improvement in symptoms so as ready for discharge Improvement in symptoms so as ready for discharge   Short Term Goals: Ability to disclose and discuss suicidal ideas Ability to demonstrate self-control will improve Compliance with prescribed medications will improve     Medication Management: Evaluate patient's response, side effects, and tolerance of medication regimen.  Therapeutic Interventions: 1 to 1 sessions, Unit Group sessions and Medication administration.  Evaluation of Outcomes: Progressing   RN Treatment Plan for Primary Diagnosis: Schizophrenia ([place]) Long Term Goal(s): Knowledge of disease and therapeutic regimen to maintain health will improve  Short Term Goals: Ability to verbalize frustration and anger appropriately will improve, Ability to demonstrate self-control, Ability to participate in decision making will improve and Ability to identify and develop  effective coping behaviors will improve  Medication Management: RN will administer medications as ordered by provider, will assess and evaluate patient's response and provide education to patient for prescribed medication. RN will report any adverse and/or side effects to prescribing provider.  Therapeutic Interventions: 1 on 1 counseling sessions, Psychoeducation, Medication administration, Evaluate responses to treatment, Monitor vital signs and CBGs as ordered, Perform/monitor CIWA, COWS, AIMS and Fall Risk screenings as ordered, Perform wound care treatments as ordered.  Evaluation of Outcomes: Progressing   LCSW Treatment Plan for Primary Diagnosis: Schizophrenia ([place]) Long Term Goal(s): Safe transition to appropriate next level of care at discharge, Engage patient in therapeutic group addressing interpersonal concerns.  Short Term Goals: Engage patient in aftercare planning with referrals and resources, Increase social support, Increase ability to appropriately verbalize feelings, Facilitate acceptance of mental health diagnosis and concerns and  Increase skills for wellness and recovery  Therapeutic Interventions: Assess for all discharge needs, 1 to 1 time with Social worker, Explore available resources and support systems, Assess for adequacy in community support network, Educate family and significant other(s) on suicide prevention, Complete Psychosocial Assessment, Interpersonal group therapy.  Evaluation of Outcomes: Progressing   Progress in Treatment: Attending groups: Yes. Participating in groups: Yes. Taking medication as prescribed: Yes. Toleration medication: Yes. Family/Significant other contact made: Yes, individual(s) contacted:  pts friend Patient understands diagnosis: Yes. Discussing patient identified problems/goals with staff: Yes. Medical problems stabilized or resolved: Yes. Denies suicidal/homicidal ideation: Yes. Issues/concerns per patient self-inventory:  No. Other: N/A  New problem(s) identified: No, Describe:  none  New Short Term/Long Term Goal(s): medication management for mood stabilization;  development of comprehensive mental wellness/sobriety plan.   Patient Goals:  "to get better, get back out, and get back to work when I can"  Discharge Plan or Barriers: SPE pamphlet, Mobile Crisis information, and AA/NA information provided to patient for additional community support and resources. Pt has an appointment with [facility] on 6/22 at 12:30PM.  Reason for Continuation of Hospitalization: Depression Medication stabilization  Estimated Length of Stay: 5-7 days   Recreational Therapy: Patient Stressors: Work  Patient Goal: Patient will engage in groups without prompting or encouragement from LRT x3 group sessions within 5 recreation therapy group sessions  Attendees: Patient:Richard Vasquez  07/10/2018 3:24 PM  Physician: Dr Weber Cooks MD 07/10/2018 3:24 PM  Nursing: Jerry Caras RN 07/10/2018 3:24 PM  RN Care Manager: 07/10/2018 3:24 PM  Social Worker: Olivia Moton LCSW 07/10/2018 3:24 PM  Recreational Therapist: Roanna Epley CTRS LRT 07/10/2018 3:24 PM  Other: Assunta Curtis LCSW 07/10/2018 3:24 PM  Other:  07/10/2018 3:24 PM  Other: 07/10/2018 3:24 PM    Scribe for Treatment Team: Mariann Laster Moton, LCSW 07/10/2018 3:24 PM

## 2018-07-10 NOTE — Plan of Care (Signed)
Pt denies depression, anxiety, SI, HI and AVH. It is very difficult to get the pt to cooperate especially with medication administration. Pt has been educated on care plan and verbalizes understanding. Collier Bullock RN Problem: Education: Goal: Knowledge of [place] [facility] Education information/materials will improve Outcome: Not Progressing   Problem: Safety: Goal: Periods of time without injury will increase Outcome: Progressing   Problem: Education: Goal: Utilization of techniques to improve thought processes will improve Outcome: Not Progressing Goal: Knowledge of the prescribed therapeutic regimen will improve Outcome: Not Progressing   Problem: Safety: Goal: Ability to disclose and discuss suicidal ideas will improve Outcome: Progressing Goal: Ability to identify and utilize support systems that promote safety will improve Outcome: Progressing

## 2018-07-10 NOTE — [facility] Group Notes (Signed)
LCSW Group Therapy Note  07/10/2018 1:00 PM  Type of Therapy and Topic:  Group Therapy:  Feelings around Relapse and Recovery  Participation Level:  Minimal   Description of Group:    Patients in this group will discuss emotions they experience before and after a relapse. They will process how experiencing these feelings, or avoidance of experiencing them, relates to having a relapse. Facilitator will guide patients to explore emotions they have related to recovery. Patients will be encouraged to process which emotions are more powerful. They will be guided to discuss the emotional reaction significant others in their lives may have to their relapse or recovery. Patients will be assisted in exploring ways to respond to the emotions of others without this contributing to a relapse.  Therapeutic Goals: 1. Patient will identify two or more emotions that lead to a relapse for them 2. Patient will identify two emotions that result when they relapse 3. Patient will identify two emotions related to recovery 4. Patient will demonstrate ability to communicate their needs through discussion and/or role plays   Summary of Patient Progress: Patient was present in group and an active participant.  Patient reports that loneliness and boredom have led to relapses.  Patient was attentive and nodded along as other group members engaged in discussion.    Therapeutic Modalities:   Cognitive Behavioral Therapy Solution-Focused Therapy Assertiveness Training Relapse Prevention Therapy   Assunta Curtis, MSW, LCSW 07/10/2018 12:50 PM

## 2018-07-10 NOTE — Progress Notes (Signed)
Recreation Therapy Notes  INPATIENT RECREATION THERAPY ASSESSMENT  Patient Details Name: VICTORIA EUCEDA MRN: [id number] DOB: 1960-05-14 Today's Date: 07/10/2018       Information Obtained From: Patient  Able to Participate in Assessment/Interview: Yes  Patient Presentation: Responsive  Reason for Admission (Per Patient): Active Symptoms  Patient Stressors: Work  Radiographer, therapeutic:   Veterinary surgeon (2+):  Art - Publishing copy, Games - [employer], Lawyer, Therapist, music - Hunting(Horse shoe)  Frequency of Recreation/Participation: Monthly  Awareness of Community Resources:     [employer]:     Current Use:    If no, Barriers?:    Expressed Interest in [place] of Residence:  Insurance underwriter  Patient Main Form of Transportation: Other (Comment)(Moped)  Patient Strengths:  My work Psychologist, forensic, Helping others  Patient Identified Areas of Improvement:  Staying busy  Patient Goal for Hospitalization:  To better myself  Current SI (including self-harm):  No  Current HI:  No  Current AVH: No  Staff Intervention Plan: Group Attendance, Collaborate with Interdisciplinary Treatment Team  Consent to Intern Participation: N/A  Diarra Kos 07/10/2018, 11:50 AM

## 2018-07-10 NOTE — Progress Notes (Signed)
D - Patient was in the day room upon arrival to the unit. Patient was pleasant during assessment and medication administration. Patient denies SI/HI/AVH, anxiety and depression with this Probation officer. Patient rated his pain 8/10 (see MAR) Patient said he had a good day and was very pleasant. Patient took his night medications without problem and fell asleep. At 0200 the patient used the call bell but wouldn't say anything. This writer went down to assess the patient. Patient apologized and said he was going to go back to sleep. 0210 patient yelled out from his room, this Probation officer and the other RN went to check the patient. Patient said he was praying and to just let him be. Patient was given education about being quite so the other patients could rest. Patient apologized and said he would be quite. 0220 patient yelled out from his room again. Patient given education. 0230 Patient yelled out again and was given medication (see MAR).   A - Patient compliant with medication administration. Patient given education. 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 Patient informed to let staff know if there are any issues or problems on the unit.   R - Patient being monitored Q 15 minutes for safety. Patient remains safe on the unit.

## 2018-07-11 DIAGNOSIS — S12191S Other nondisplaced fracture of second cervical vertebra, sequela: Secondary | ICD-10-CM

## 2018-07-11 MED ORDER — QUETIAPINE FUMARATE 200 MG PO TABS
200.0000 mg | ORAL_TABLET | Freq: Every day | ORAL | Status: DC
  Administered 2018-07-11 – 2018-07-13 (×3): 200 mg via ORAL
  Filled 2018-07-11 (×3): qty 1

## 2018-07-11 MED ORDER — HYDROXYZINE HCL 50 MG PO TABS
50.0000 mg | ORAL_TABLET | Freq: Three times a day (TID) | ORAL | Status: DC | PRN
  Administered 2018-07-11 – 2018-07-14 (×3): 50 mg via ORAL
  Filled 2018-07-11 (×3): qty 1

## 2018-07-11 MED ORDER — GABAPENTIN 300 MG PO CAPS
300.0000 mg | ORAL_CAPSULE | Freq: Three times a day (TID) | ORAL | Status: DC
  Administered 2018-07-11 – 2018-07-14 (×10): 300 mg via ORAL
  Filled 2018-07-11 (×10): qty 1

## 2018-07-11 NOTE — Plan of Care (Signed)
  Problem: Education: Goal: Knowledge of [place] [facility] Education information/materials will improve Outcome: Progressing   Problem: Safety: Goal: Periods of time without injury will increase Outcome: Progressing   Problem: Education: Goal: Utilization of techniques to improve thought processes will improve Outcome: Progressing Goal: Knowledge of the prescribed therapeutic regimen will improve Outcome: Progressing   Problem: Safety: Goal: Ability to disclose and discuss suicidal ideas will improve Outcome: Progressing Goal: Ability to identify and utilize support systems that promote safety will improve Outcome: Progressing

## 2018-07-11 NOTE — Progress Notes (Signed)
[facility] MD Progress Note  07/11/2018 11:31 AM Richard Vasquez  MRN:  [id number] Subjective: Patient is a 58 year old male with a past psychiatric history significant for schizophrenia and a closed displaced supracondylar fracture of the distal end of the right femur with intracondylar extension as well as a C2 cervical fracture.  He was admitted on 6/11 because he was talking nonsensically, was agitated, disorganized and confused.  It also appeared that he was having auditory hallucinations as well as suicidal and homicidal command content auditory hallucinations.  Objective: Patient is seen and examined.  Patient is a 58 year old male with the above-stated past medical and psychiatric history who is seen in follow-up.  He denied any complaints today.  He denied any auditory or visual hallucinations.  He denied any suicidal or homicidal ideation.  He did complain of anxiety and was requesting some change in medicines to be able to get his anxiety under control.  Review of the notes from yesterday revealed that his leg pain issue was stable, but did have some discomfort in his neck.  He continues with the neck brace.  Neurosurgical service was called yesterday, but apparently they have not seen the patient as of yet.  His current medications include a coated aspirin a day, gabapentin, hydroxyzine, methocarbamol, Zyprexa, oxycodone, MiraLAX, and Seroquel.  Review of his laboratories from 6/10 were all essentially normal except for a mild anemia.  Drug screen from 6/10 was positive for benzodiazepines as well as try cyclic antidepressants.  I did tell the patient today that if we did anything for his anxiety would not include any benzodiazepines.  His blood pressure today has an elevated diastolic pressure of 619/50.  Review of earlier blood pressures revealed his diastolic pressure also to remain elevated.  He is afebrile, and his oxygen saturations 100% on room air.  He only slept 3.5 hours last night.  Principal  Problem: Schizophrenia ([place]) Diagnosis: Principal Problem:   Schizophrenia ([place]) Active Problems:   Closed displaced supracondylar fracture of distal end of right femur with intracondylar extension ([place])   C2 cervical fracture ([place])  Total Time spent with patient: 30 minutes  Past Psychiatric History: See admission H&P  Past Medical History:  Past Medical History:  Diagnosis Date  . BPH (benign prostatic hyperplasia)   . Chronic pain   . Closed fracture of distal end of right femur ([place]) 06/09/2018    Past Surgical History:  Procedure Laterality Date  . ORIF FEMUR FRACTURE Right 06/10/2018   Procedure: OPEN REDUCTION INTERNAL FIXATION (ORIF) DISTAL FEMUR FRACTURE;  Surgeon: Shona Needles, MD;  Location: [facility];  Service: Orthopedics;  Laterality: Right;   Family History: History reviewed. No pertinent family history. Family Psychiatric  History: See admission H&P Social History:  Social History   Substance and Sexual Activity  Alcohol Use Not Currently     Social History   Substance and Sexual Activity  Drug Use Not Currently    Social History   Socioeconomic History  . Marital status: Divorced    Spouse name: Not on file  . Number of children: Not on file  . Years of education: Not on file  . Highest education level: Not on file  Occupational History  . Not on file  Social Needs  . Financial resource strain: Not on file  . Food insecurity    Worry: Not on file    Inability: Not on file  . Transportation needs    Medical: Not on file    Non-medical:  Not on file  Tobacco Use  . Smoking status: Current Every Day Smoker    Packs/day: 2.00    Years: 20.00    Pack years: 40.00    Types: Cigarettes  . Smokeless tobacco: Never Used  Substance and Sexual Activity  . Alcohol use: Not Currently  . Drug use: Not Currently  . Sexual activity: Not on file  Lifestyle  . Physical activity    Days per week: 5 days    Minutes per session: 100 min  . Stress: Very  much  Relationships  . Social Herbalist on phone: Not on file    Gets together: Not on file    Attends religious service: Not on file    Active member of club or organization: Not on file    Attends meetings of clubs or organizations: Not on file    Relationship status: Not on file  Other Topics Concern  . Not on file  Social History Narrative  . Not on file   Additional Social History:    Pain Medications: See MAR Prescriptions: See MAR Over the Counter: See MAR History of alcohol / drug use?: (None Reported) Longest period of sobriety (when/how long): Myer Haff Negative Consequences of Use: Myer Haff) Withdrawal Symptoms: Myer Haff)                    Sleep: Fair  Appetite:  Fair  Current Medications: Current Facility-Administered Medications  Medication Dose Route Frequency Provider Last Rate Last Dose  . acetaminophen (TYLENOL) tablet 650 mg  650 mg Oral Q6H PRN Lavella Hammock, MD   650 mg at 07/10/18 2131  . alum & mag hydroxide-simeth (MAALOX/MYLANTA) 200-200-20 MG/5ML suspension 30 mL  30 mL Oral Q4H PRN Lavella Hammock, MD      . aspirin EC tablet 325 mg  325 mg Oral Daily Clapacs, Madie Reno, MD   325 mg at 07/11/18 0831  . docusate sodium (COLACE) capsule 100 mg  100 mg Oral BID Lavella Hammock, MD   100 mg at 07/11/18 0831  . gabapentin (NEURONTIN) capsule 100 mg  100 mg Oral TID Lavella Hammock, MD   100 mg at 07/11/18 0831  . hydrOXYzine (ATARAX/VISTARIL) tablet 25 mg  25 mg Oral TID PRN Lavella Hammock, MD   25 mg at 07/09/18 2127  . magnesium hydroxide (MILK OF MAGNESIA) suspension 30 mL  30 mL Oral Daily PRN Lavella Hammock, MD      . methocarbamol (ROBAXIN) tablet 500 mg  500 mg Oral Q6H PRN Lavella Hammock, MD      . multivitamin with minerals tablet 1 tablet  1 tablet Oral Daily Lavella Hammock, MD   1 tablet at 07/11/18 0831  . nicotine (NICODERM CQ - dosed in mg/24 hr) patch 7 mg  7 mg Transdermal Daily Lavella Hammock, MD   7 mg at 07/11/18 0962   . OLANZapine zydis (ZYPREXA) disintegrating tablet 10 mg  10 mg Oral Q8H PRN Lavella Hammock, MD      . OLANZapine zydis [facility]) disintegrating tablet 5 mg  5 mg Oral BID Clapacs, Madie Reno, MD   5 mg at 07/11/18 8366  . omega-3 acid ethyl esters (LOVAZA) capsule 1 g  1 g Oral Daily Lavella Hammock, MD   1 g at 07/11/18 [phone]  . oxyCODONE (Oxy IR/ROXICODONE) immediate release tablet 5 mg  5 mg Oral Q6H PRN Lavella Hammock, MD   5 mg  at 07/10/18 1717  . polyethylene glycol (MIRALAX / GLYCOLAX) packet 17 g  17 g Oral Daily Lavella Hammock, MD   17 g at 07/11/18 0831  . QUEtiapine (SEROQUEL) tablet 100 mg  100 mg Oral QHS Lavella Hammock, MD   100 mg at 07/10/18 2129  . vitamin C (ASCORBIC ACID) tablet 1,000 mg  1,000 mg Oral BID Lavella Hammock, MD   1,000 mg at 07/11/18 0831    Lab Results: No results found for this or any previous visit (from the past 48 hour(s)).  Blood Alcohol level:  Lab Results  Component Value Date   ETH <10 07/08/2018   ETH <10 76/19/5093    Metabolic Disorder Labs: Lab Results  Component Value Date   HGBA1C 5.2 07/08/2018   MPG 102.54 07/08/2018   No results found for: PROLACTIN Lab Results  Component Value Date   CHOL 218 (H) 07/08/2018   TRIG 37 07/08/2018   HDL 45 07/08/2018   CHOLHDL 4.8 07/08/2018   VLDL 7 07/08/2018   LDLCALC 166 (H) 07/08/2018    Physical Findings: AIMS:  , ,  ,  ,    CIWA:    COWS:     Musculoskeletal: Strength & Muscle Tone: decreased Gait & Station: unsteady Patient leans: N/A  Psychiatric Specialty Exam: Physical Exam  Nursing note and vitals reviewed. Constitutional: He is oriented to person, place, and time. He appears well-developed and well-nourished.  HENT:  Head: Normocephalic and atraumatic.  Respiratory: Effort normal.  Neurological: He is alert and oriented to person, place, and time.    ROS  Blood pressure (!) 119/99, pulse 92, temperature 97.9 F (36.6 C), temperature source Oral, resp. rate  18, height 5\' 10"  (1.778 m), weight 63 kg, SpO2 100 %.Body mass index is 19.93 kg/m.  General Appearance: Casual  Eye Contact:  Fair  Speech:  Normal Rate  Volume:  Normal  Mood:  Anxious and Dysphoric  Affect:  Congruent  Thought Process:  Goal Directed and Descriptions of Associations: Intact  Orientation:  Full (Time, Place, and Person)  Thought Content:  Logical  Suicidal Thoughts:  No  Homicidal Thoughts:  No  Memory:  Immediate;   Fair Recent;   Fair Remote;   Fair  Judgement:  Intact  Insight:  Fair  Psychomotor Activity:  Increased  Concentration:  Concentration: Fair and Attention Span: Fair  Recall:  [employer] of Knowledge:  Fair  Language:  Fair  Akathisia:  Negative  Handed:  Right  AIMS (if indicated):     Assets:  Desire for Improvement Resilience  ADL's:  Impaired  Cognition:  WNL  Sleep:  Number of Hours: 3.5     Treatment Plan Summary: Daily contact with patient to assess and evaluate symptoms and progress in treatment, Medication management and Plan : Patient is seen and examined.  Patient is a 58 year old male with the above-stated past psychiatric history who is seen in follow-up.   Diagnosis: #1 schizophrenia, #2 insomnia, #3 hypertension, #4 closed displaced supracondylar fracture of distal end of right femur with intracondylar extension, #5 C2 cervical fracture, #6 chronic pain, #7 benign prostatic hypertrophy  Patient is seen in follow-up.  His only complaint today is increased anxiety, as well as poor sleep.  His pain control seems to be adequate.  When he was discharged from the rehabilitation facility on 5/22 he was on gabapentin as well as methocarbamol and oxycodone.  I am going to increase to gabapentin  300 mg  p.o. 3 times daily for anxiety as well as chronic pain.  Hopefully that will help his sleep in addition.  I am also going to increase his Seroquel to 200 mg p.o. nightly.  His hydroxyzine will be increased to 50 mg p.o. 3 times daily as  needed anxiety as well.  His diastolic pressure remains elevated.  He was not discharged on any antihypertensive medications from rehabilitation.  I will add clonidine 0.1 mg p.o. 3 times daily a diastolic pressure greater than 90.  This will be in the short run, and hopefully if his pain control and sleep improves his blood pressure will improve.  No other changes to his medications. 1.  Continue acetaminophen 650 mg p.o. every 6 hours as needed pain. 2.  Continue a coated aspirin 325 mg p.o. daily for heart health. 3.  Continue Colace 100 mg p.o. twice daily for constipation. 4.  Increase gabapentin to 300 mg p.o. 3 times daily for anxiety and chronic pain. 5.  Continue methocarbamol 300 mg p.o. every 6 hours as needed muscle spasm. 6.  Continue Zyprexa 10 mg every 8 hours PRN agitation. 7.  Continue Zyprexa 5 mg p.o. twice daily for schizophrenia. 8.  Continue Lovaza 1 g p.o. daily for neuro protection. 9.  Continue oxycodone 5 mg every 6 hours as needed pain. 10.  Continue MiraLAX 17 g in 8 ounces water p.o. daily for constipation. 11.  Increase Seroquel to 200 mg p.o. nightly for psychosis and sleep. 12.  Add clonidine 0.1 mg p.o. 3 times daily diastolic blood pressure greater than 90. 13.  Increase hydroxyzine to 50 mg p.o. every 6 hours as needed anxiety. 14.  Disposition planning-in progress.  Sharma Covert, MD 07/11/2018, 11:31 AM

## 2018-07-11 NOTE — Plan of Care (Signed)
Patient alert and oriented. Patient denies SI/HI/AVH and pain at this time. Patient endorsing depression rating it a 3 and anxiety rating it an 8. Patient's goal for today is to take his medicine and attend groups and to take it one day at a time through [facility]. Patient observed outside this morning interacting with select peers.Took and tolerated scheduled medications with some encouragement, no adverse reactions noted. Support and encouragement provided.  Routine safety checks conducted every 15 minutes.  Patient informed to notify staff with problems or concerns.

## 2018-07-11 NOTE — Progress Notes (Signed)
Patient is presenting anxiety symptoms and pain to his right lower extremity , report feeling apprehensive and difficulty concentrating , patient is compliant with his medications , patient is participating in social group activities , patient expressed poor appetite with diminished food and fluid intake, but have fewer instances of impulsive behavior, education and support is provided , PRM meds. Used occasionally and are described as effective  , patient is complying with safety rules and 15 minutes safety check is maintained no distress.

## 2018-07-11 NOTE — [facility] Group Notes (Signed)
LCSW Group Therapy Note   07/11/2018 1:15pm   Type of Therapy and Topic:  Group Therapy:  Trust and Honesty  Participation Level:  Active  Description of Group:    In this group patients will be asked to explore the value of being honest.  Patients will be guided to discuss their thoughts, feelings, and behaviors related to honesty and trusting in others. Patients will process together how trust and honesty relate to forming relationships with peers, family members, and self. Each patient will be challenged to identify and express feelings of being vulnerable. Patients will discuss reasons why people are dishonest and identify alternative outcomes if one was truthful (to self or others). This group will be process-oriented, with patients participating in exploration of their own experiences, giving and receiving support, and processing challenge from other group members.   Therapeutic Goals: 1. Patient will identify why honesty is important to relationships and how honesty overall affects relationships.  2. Patient will identify a situation where they lied or were lied too and the  feelings, thought process, and behaviors surrounding the situation 3. Patient will identify the meaning of being vulnerable, how that feels, and how that correlates to being honest with self and others. 4. Patient will identify situations where they could have told the truth, but instead lied and explain reasons of dishonesty.   Summary of Patient Progress The patient was able to explore the value of being honest.  Patient discussed thoughts, feelings, and behaviors related to honesty and trusting in others. The patient processed together with other group members how trust and honesty relate to forming relationships with peers, family members, and self. Pt actively and appropriately engaged in the group. Patient was able to provide support and validation to other group members. Patient practiced active listening when  interacting with the facilitator and other group members.    Therapeutic Modalities:   Cognitive Behavioral Therapy Solution Focused Therapy Motivational Interviewing Brief Therapy  Pegah Segel  CUEBAS-COLON, LCSW 07/11/2018 3:59 PM

## 2018-07-12 MED ORDER — TRAZODONE HCL 100 MG PO TABS
100.0000 mg | ORAL_TABLET | Freq: Every day | ORAL | Status: DC
  Administered 2018-07-12 – 2018-07-13 (×2): 100 mg via ORAL
  Filled 2018-07-12 (×2): qty 1

## 2018-07-12 NOTE — [facility] Group Notes (Signed)
LCSW Group Therapy Note 07/12/2018 1:15pm  Type of Therapy and Topic: Group Therapy: Feelings Around Returning Home & Establishing a Supportive Framework and Supporting Oneself When Supports Not Available  Participation Level: Active  Description of Group:  Patients first processed thoughts and feelings about upcoming discharge. These included fears of upcoming changes, lack of change, new living environments, judgements and expectations from others and overall stigma of mental health issues. The group then discussed the definition of a supportive framework, what that looks and feels like, and how do to discern it from an unhealthy non-supportive network. The group identified different types of supports as well as what to do when your family/friends are less than helpful or unavailable  Therapeutic Goals  1. Patient will identify one healthy supportive network that they can use at discharge. 2. Patient will identify one factor of a supportive framework and how to tell it from an unhealthy network. 3. Patient able to identify one coping skill to use when they do not have positive supports from others. 4. Patient will demonstrate ability to communicate their needs through discussion and/or role plays.  Summary of Patient Progress:  The patient reported he feels "good." Pt engaged during group session. As patients processed their anxiety about discharge and described healthy supports patient shared he is ready to be discharge. He listed his son as his main support. Patients identified at least one self-care tool they were willing to use after discharge.   Therapeutic Modalities Cognitive Behavioral Therapy Motivational Interviewing   Raima Geathers  CUEBAS-COLON, LCSW 07/12/2018 11:41 AM

## 2018-07-12 NOTE — Plan of Care (Signed)
Patient is responding  nicely to treatment regimen and communicating needs as encouraged, complying with his medications which he deems adequate . Patient has expressed generalized pain to which he was medicated properly and has denies any SI/HI/AVH at this time , education and support is provided to improve in social participations in groups with peers and to spend more leisure time to socialize with peers , patient acknowledged , no acte distress, 15 minutes safety check is maintained.   Problem: Education: Goal: Knowledge of [place] [facility] Education information/materials will improve Outcome: Progressing   Problem: Safety: Goal: Periods of time without injury will increase Outcome: Progressing   Problem: Education: Goal: Utilization of techniques to improve thought processes will improve Outcome: Progressing Goal: Knowledge of the prescribed therapeutic regimen will improve Outcome: Progressing   Problem: Safety: Goal: Ability to disclose and discuss suicidal ideas will improve Outcome: Progressing Goal: Ability to identify and utilize support systems that promote safety will improve Outcome: Progressing

## 2018-07-12 NOTE — Progress Notes (Signed)
[facility] MD Progress Note  07/12/2018 10:09 AM Richard Vasquez  MRN:  [id number] Subjective:  Patient is a 58 year old male with a past psychiatric history significant for schizophrenia and a closed displaced supracondylar fracture of the distal end of the right femur with intracondylar extension as well as a C2 cervical fracture.  He was admitted on 6/11 because he was talking nonsensically, was agitated, disorganized and confused.  It also appeared that he was having auditory hallucinations as well as suicidal and homicidal command content auditory hallucinations.  Objective: Patient is seen and examined.  Patient's 58 year old male with the above-stated past psychiatric and medical history who is seen in follow-up.  His only complaint today is he would like to have a chunk of dried blood from his head trauma cut out of his hair.  He denied any auditory or visual hallucinations.  He denied any psychosis.  He denied any suicidal or homicidal ideation.  His pain control is stable.  He is asking when his follow-up about his knee replacement is after discharge.  He continues to wear his neck brace, but it does appear that he is able to move his neck quite easily.  His vital signs are stable, he is afebrile.  Unfortunately he continues to have problems with sleep.  He only slept 2.25 hours last night.  His nightly medications include Zyprexa 5 mg and Seroquel 200 mg at at bedtime.  Principal Problem: Schizophrenia ([place]) Diagnosis: Principal Problem:   Schizophrenia ([place]) Active Problems:   Closed displaced supracondylar fracture of distal end of right femur with intracondylar extension ([place])   C2 cervical fracture ([place])  Total Time spent with patient: 15 minutes  Past Psychiatric History: See admission H&P  Past Medical History:  Past Medical History:  Diagnosis Date  . BPH (benign prostatic hyperplasia)   . Chronic pain   . Closed fracture of distal end of right femur ([place]) 06/09/2018    Past  Surgical History:  Procedure Laterality Date  . ORIF FEMUR FRACTURE Right 06/10/2018   Procedure: OPEN REDUCTION INTERNAL FIXATION (ORIF) DISTAL FEMUR FRACTURE;  Surgeon: Shona Needles, MD;  Location: [place];  Service: Orthopedics;  Laterality: Right;   Family History: History reviewed. No pertinent family history. Family Psychiatric  History: See admission H&P Social History:  Social History   Substance and Sexual Activity  Alcohol Use Not Currently     Social History   Substance and Sexual Activity  Drug Use Not Currently    Social History   Socioeconomic History  . Marital status: Divorced    Spouse name: Not on file  . Number of children: Not on file  . Years of education: Not on file  . Highest education level: Not on file  Occupational History  . Not on file  Social Needs  . Financial resource strain: Not on file  . Food insecurity    Worry: Not on file    Inability: Not on file  . Transportation needs    Medical: Not on file    Non-medical: Not on file  Tobacco Use  . Smoking status: Current Every Day Smoker    Packs/day: 2.00    Years: 20.00    Pack years: 40.00    Types: Cigarettes  . Smokeless tobacco: Never Used  Substance and Sexual Activity  . Alcohol use: Not Currently  . Drug use: Not Currently  . Sexual activity: Not on file  Lifestyle  . Physical activity    Days per week: 5  days    Minutes per session: 100 min  . Stress: Very much  Relationships  . Social Herbalist on phone: Not on file    Gets together: Not on file    Attends religious service: Not on file    Active member of club or organization: Not on file    Attends meetings of clubs or organizations: Not on file    Relationship status: Not on file  Other Topics Concern  . Not on file  Social History Narrative  . Not on file   Additional Social History:    Pain Medications: See MAR Prescriptions: See MAR Over the Counter: See MAR History of alcohol / drug use?:  (None Reported) Longest period of sobriety (when/how long): Myer Haff Negative Consequences of Use: Myer Haff) Withdrawal Symptoms: Myer Haff)                    Sleep: Poor  Appetite:  Fair  Current Medications: Current Facility-Administered Medications  Medication Dose Route Frequency Provider Last Rate Last Dose  . acetaminophen (TYLENOL) tablet 650 mg  650 mg Oral Q6H PRN Lavella Hammock, MD   650 mg at 07/10/18 2131  . alum & mag hydroxide-simeth (MAALOX/MYLANTA) 200-200-20 MG/5ML suspension 30 mL  30 mL Oral Q4H PRN Lavella Hammock, MD      . aspirin EC tablet 325 mg  325 mg Oral Daily Clapacs, Madie Reno, MD   325 mg at 07/12/18 0803  . docusate sodium (COLACE) capsule 100 mg  100 mg Oral BID Lavella Hammock, MD   100 mg at 07/12/18 0803  . gabapentin (NEURONTIN) capsule 300 mg  300 mg Oral TID Sharma Covert, MD   300 mg at 07/12/18 0803  . hydrOXYzine (ATARAX/VISTARIL) tablet 50 mg  50 mg Oral TID PRN Sharma Covert, MD   50 mg at 07/11/18 2132  . magnesium hydroxide (MILK OF MAGNESIA) suspension 30 mL  30 mL Oral Daily PRN Lavella Hammock, MD      . methocarbamol (ROBAXIN) tablet 500 mg  500 mg Oral Q6H PRN Lavella Hammock, MD      . multivitamin with minerals tablet 1 tablet  1 tablet Oral Daily Lavella Hammock, MD   1 tablet at 07/12/18 0803  . nicotine (NICODERM CQ - dosed in mg/24 hr) patch 7 mg  7 mg Transdermal Daily Lavella Hammock, MD   7 mg at 07/12/18 0800  . OLANZapine zydis (ZYPREXA) disintegrating tablet 10 mg  10 mg Oral Q8H PRN Lavella Hammock, MD   10 mg at 07/11/18 1723  . OLANZapine zydis (ZYPREXA) disintegrating tablet 5 mg  5 mg Oral BID Clapacs, Madie Reno, MD   5 mg at 07/12/18 0803  . omega-3 acid ethyl esters (LOVAZA) capsule 1 g  1 g Oral Daily Lavella Hammock, MD   1 g at 07/12/18 0804  . oxyCODONE (Oxy IR/ROXICODONE) immediate release tablet 5 mg  5 mg Oral Q6H PRN Lavella Hammock, MD   5 mg at 07/12/18 0555  . polyethylene glycol (MIRALAX / GLYCOLAX)  packet 17 g  17 g Oral Daily Lavella Hammock, MD   17 g at 07/12/18 0804  . QUEtiapine (SEROQUEL) tablet 200 mg  200 mg Oral QHS Sharma Covert, MD   200 mg at 07/11/18 2132  . vitamin C (ASCORBIC ACID) tablet 1,000 mg  1,000 mg Oral BID Lavella Hammock, MD   1,000 mg at  07/12/18 0803    Lab Results: No results found for this or any previous visit (from the past 48 hour(s)).  Blood Alcohol level:  Lab Results  Component Value Date   ETH <10 07/08/2018   ETH <10 93/81/0175    Metabolic Disorder Labs: Lab Results  Component Value Date   HGBA1C 5.2 07/08/2018   MPG 102.54 07/08/2018   No results found for: PROLACTIN Lab Results  Component Value Date   CHOL 218 (H) 07/08/2018   TRIG 37 07/08/2018   HDL 45 07/08/2018   CHOLHDL 4.8 07/08/2018   VLDL 7 07/08/2018   LDLCALC 166 (H) 07/08/2018    Physical Findings: AIMS:  , ,  ,  ,    CIWA:    COWS:     Musculoskeletal: Strength & Muscle Tone: within normal limits Gait & Station: shuffle Patient leans: N/A  Psychiatric Specialty Exam: Physical Exam  Nursing note and vitals reviewed. Constitutional: He is oriented to person, place, and time. He appears well-developed and well-nourished.  HENT:  Head: Normocephalic and atraumatic.  Respiratory: Effort normal.  Neurological: He is alert and oriented to person, place, and time.    ROS  Blood pressure 120/78, pulse 87, temperature 98 F (36.7 C), temperature source Oral, resp. rate 18, height 5\' 10"  (1.778 m), weight 63 kg, SpO2 100 %.Body mass index is 19.93 kg/m.  General Appearance: Disheveled  Eye Contact:  Good  Speech:  Normal Rate  Volume:  Normal  Mood:  Anxious  Affect:  Congruent  Thought Process:  Coherent and Descriptions of Associations: Intact  Orientation:  Full (Time, Place, and Person)  Thought Content:  Logical  Suicidal Thoughts:  No  Homicidal Thoughts:  No  Memory:  Immediate;   Fair Recent;   Fair Remote;   Fair  Judgement:  Intact   Insight:  Fair  Psychomotor Activity:  Increased  Concentration:  Concentration: Fair and Attention Span: Fair  Recall:  [employer] of Knowledge:  Fair  Language:  Fair  Akathisia:  Negative  Handed:  Right  AIMS (if indicated):     Assets:  Desire for Improvement Resilience  ADL's:  Intact  Cognition:  WNL  Sleep:  Number of Hours: 2.25     Treatment Plan Summary: Daily contact with patient to assess and evaluate symptoms and progress in treatment, Medication management and Plan : Patient is seen and examined.  Patient is a 58 year old male with the above-stated past psychiatric history who is seen in follow-up.   Diagnosis: #1 schizophrenia, #2 insomnia, #3 hypertension, #4 closed displaced supracondylar fracture of distal end of right femur with intracondylar extension, #5 C2 cervical fracture, #6 chronic pain, #7 benign prostatic hypertrophy  From a mood and thought disorder standpoint the patient is doing well.  His sleep seems to be an issue.  He also remains somewhat anxious.  He is getting Zyprexa as well as Seroquel for sleep.  His Seroquel was increased to 200 mg p.o. nightly last night, but still only got 2.25 hours.  I will add trazodone 100 mg p.o. nightly to see if it will help with this, but I am sure his neck issues continue to cause some problem with sleep.  He continues on gabapentin, methocarbamol and oxycodone for pain.  Hopefully these changes will assist with sleep.  1.  Continue acetaminophen 650 mg p.o. every 6 hours as needed pain. 2.  Continue a coated aspirin 325 mg p.o. daily for heart health. 3.  Continue Colace  100 mg p.o. twice daily for constipation. 4.  Increase gabapentin to 300 mg p.o. 3 times daily for anxiety and chronic pain. 5.  Continue methocarbamol 300 mg p.o. every 6 hours as needed muscle spasm. 6.  Continue Zyprexa 10 mg every 8 hours PRN agitation. 7.  Continue Zyprexa 5 mg p.o. twice daily for schizophrenia. 8.  Continue Lovaza 1 g p.o.  daily for neuro protection. 9.  Continue oxycodone 5 mg every 6 hours as needed pain. 10.  Continue MiraLAX 17 g in 8 ounces water p.o. daily for constipation. 11.   Continue Seroquel to 200 mg p.o. nightly for psychosis and sleep. 12.    Continue clonidine 0.1 mg p.o. 3 times daily diastolic blood pressure greater than 90. 13.    Continue hydroxyzine to 50 mg p.o. every 6 hours as needed anxiety. 15.  Add trazodone 100 mg p.o. nightly for sleep and insomnia. 16.  Disposition planning-in progress.  Sharma Covert, MD 07/12/2018, 10:09 AM

## 2018-07-12 NOTE — Plan of Care (Signed)
Patient alert and oriented. Patient voiced neck pain this afternoon, prn Oxycodone 5 mg administered. Patient denies SI/HI/AVH at this time. Patient also denies any  depression or anxiety. Patient interacting well with select peers. Attended group this afternoon with active participation. Patient's goal for today is to take his medicine and attend groups.Took and tolerated scheduled medications administered to patient, without any adverse reactions noted. Support and encouragement provided.  Routine safety checks conducted every 15 minutes.  Patient informed to notify staff with problems or concerns.

## 2018-07-13 MED ORDER — TRAZODONE HCL 100 MG PO TABS: 100.0000 mg | ORAL_TABLET | Freq: Every day | ORAL | 0 refills | Status: DC

## 2018-07-13 MED ORDER — GABAPENTIN 300 MG PO CAPS: 300.0000 mg | ORAL_CAPSULE | Freq: Three times a day (TID) | ORAL | 0 refills | Status: DC

## 2018-07-13 MED ORDER — ASPIRIN 325 MG PO TBEC: 325.0000 mg | DELAYED_RELEASE_TABLET | Freq: Every day | ORAL | 0 refills | Status: DC

## 2018-07-13 MED ORDER — POLYETHYLENE GLYCOL 3350 17 G PO PACK: 17.0000 g | PACK | Freq: Every day | ORAL | 0 refills | Status: DC

## 2018-07-13 MED ORDER — QUETIAPINE FUMARATE 200 MG PO TABS: 200.0000 mg | ORAL_TABLET | Freq: Every day | ORAL | 0 refills | Status: DC

## 2018-07-13 MED ORDER — DOCUSATE SODIUM 100 MG PO CAPS: 100.0000 mg | ORAL_CAPSULE | Freq: Two times a day (BID) | ORAL | 0 refills | Status: DC

## 2018-07-13 MED ORDER — OLANZAPINE 5 MG PO TBDP: 5.0000 mg | ORAL_TABLET | Freq: Two times a day (BID) | ORAL | 0 refills | Status: DC

## 2018-07-13 NOTE — Progress Notes (Signed)
Recreation Therapy Notes  Date: 07/13/2018  Time: 9:30 am  Location: Craft room  Behavioral response: Appropriate  Intervention Topic: Necessities  Discussion/Intervention:  Group content on today was focused on necessities. The group defined necessities and how they determine their necessities. Individuals expressed how many necessities they have and if it changes from day to day. Patients described the difference between wants and needs. The group explained how they have overspent on wants in the past. Individuals described a reoccurring necessity for them. The intervention "What I need" helped patients differentiate between wants and needs.  Clinical Observations/Feedback:  Patient came to group late due to unknown reasons. Participant was focused on what peers and staff had to say about necessities. Individual was social with peers and staff while participating in the intervention. Kaija Kovacevic LRT/CTRS         Alina Gilkey 07/13/2018 10:28 AM

## 2018-07-13 NOTE — Plan of Care (Signed)
Patient appropriate in the unit.Denies SI,HI and AVH.Attended groups.Compliant with medications.Appetite and energy level good.Looking forward for discharge tomorrow.Support and encouragement given.
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
## 2018-07-13 NOTE — Progress Notes (Signed)
[facility] MD Progress Note  07/13/2018 5:21 PM RAFAY DAHAN  MRN:  [id number] Subjective: Follow-up for this patient with schizophrenia.  Patient says he is feeling fine.  Denies hallucinations.  Denies any mood symptoms.  No suicidal or homicidal ideation.  Not having any discomfort in his neck. Principal Problem: Schizophrenia ([place]) Diagnosis: Principal Problem:   Schizophrenia ([place]) Active Problems:   Closed displaced supracondylar fracture of distal end of right femur with intracondylar extension ([place])   C2 cervical fracture ([place])  Total Time spent with patient: 30 minutes  Past Psychiatric History: Patient has a history of schizophrenia  Past Medical History:  Past Medical History:  Diagnosis Date  . BPH (benign prostatic hyperplasia)   . Chronic pain   . Closed fracture of distal end of right femur ([place]) 06/09/2018    Past Surgical History:  Procedure Laterality Date  . ORIF FEMUR FRACTURE Right 06/10/2018   Procedure: OPEN REDUCTION INTERNAL FIXATION (ORIF) DISTAL FEMUR FRACTURE;  Surgeon: Shona Needles, MD;  Location: [place];  Service: Orthopedics;  Laterality: Right;   Family History: History reviewed. No pertinent family history. Family Psychiatric  History: See previous Social History:  Social History   Substance and Sexual Activity  Alcohol Use Not Currently     Social History   Substance and Sexual Activity  Drug Use Not Currently    Social History   Socioeconomic History  . Marital status: Divorced    Spouse name: Not on file  . Number of children: Not on file  . Years of education: Not on file  . Highest education level: Not on file  Occupational History  . Not on file  Social Needs  . Financial resource strain: Not on file  . Food insecurity    Worry: Not on file    Inability: Not on file  . Transportation needs    Medical: Not on file    Non-medical: Not on file  Tobacco Use  . Smoking status: Current Every Day Smoker    Packs/day: 2.00     Years: 20.00    Pack years: 40.00    Types: Cigarettes  . Smokeless tobacco: Never Used  Substance and Sexual Activity  . Alcohol use: Not Currently  . Drug use: Not Currently  . Sexual activity: Not on file  Lifestyle  . Physical activity    Days per week: 5 days    Minutes per session: 100 min  . Stress: Very much  Relationships  . Social Herbalist on phone: Not on file    Gets together: Not on file    Attends religious service: Not on file    Active member of club or organization: Not on file    Attends meetings of clubs or organizations: Not on file    Relationship status: Not on file  Other Topics Concern  . Not on file  Social History Narrative  . Not on file   Additional Social History:    Pain Medications: See MAR Prescriptions: See MAR Over the Counter: See MAR History of alcohol / drug use?: (None Reported) Longest period of sobriety (when/how long): Myer Haff Negative Consequences of Use: Myer Haff) Withdrawal Symptoms: Myer Haff)                    Sleep: Fair  Appetite:  Fair  Current Medications: Current Facility-Administered Medications  Medication Dose Route Frequency Provider Last Rate Last Dose  . acetaminophen (TYLENOL) tablet 650 mg  650 mg Oral  Q6H PRN Lavella Hammock, MD   650 mg at 07/10/18 2131  . alum & mag hydroxide-simeth (MAALOX/MYLANTA) 200-200-20 MG/5ML suspension 30 mL  30 mL Oral Q4H PRN Lavella Hammock, MD      . aspirin EC tablet 325 mg  325 mg Oral Daily , Madie Reno, MD   325 mg at 07/13/18 0859  . docusate sodium (COLACE) capsule 100 mg  100 mg Oral BID Lavella Hammock, MD   100 mg at 07/12/18 1707  . gabapentin (NEURONTIN) capsule 300 mg  300 mg Oral TID Sharma Covert, MD   300 mg at 07/13/18 1243  . hydrOXYzine (ATARAX/VISTARIL) tablet 50 mg  50 mg Oral TID PRN Sharma Covert, MD   50 mg at 07/13/18 1009  . magnesium hydroxide (MILK OF MAGNESIA) suspension 30 mL  30 mL Oral Daily PRN Lavella Hammock, MD       . methocarbamol (ROBAXIN) tablet 500 mg  500 mg Oral Q6H PRN Lavella Hammock, MD      . multivitamin with minerals tablet 1 tablet  1 tablet Oral Daily Lavella Hammock, MD   1 tablet at 07/13/18 0859  . nicotine (NICODERM CQ - dosed in mg/24 hr) patch 7 mg  7 mg Transdermal Daily Lavella Hammock, MD   7 mg at 07/13/18 0859  . OLANZapine zydis (ZYPREXA) disintegrating tablet 10 mg  10 mg Oral Q8H PRN Lavella Hammock, MD   10 mg at 07/11/18 1723  . OLANZapine zydis (ZYPREXA) disintegrating tablet 5 mg  5 mg Oral BID , Madie Reno, MD   5 mg at 07/13/18 0858  . omega-3 acid ethyl esters (LOVAZA) capsule 1 g  1 g Oral Daily Lavella Hammock, MD   1 g at 07/13/18 1013  . oxyCODONE (Oxy IR/ROXICODONE) immediate release tablet 5 mg  5 mg Oral Q6H PRN Lavella Hammock, MD   5 mg at 07/12/18 1212  . polyethylene glycol (MIRALAX / GLYCOLAX) packet 17 g  17 g Oral Daily Lavella Hammock, MD   17 g at 07/12/18 0804  . QUEtiapine (SEROQUEL) tablet 200 mg  200 mg Oral QHS Sharma Covert, MD   200 mg at 07/12/18 2206  . traZODone (DESYREL) tablet 100 mg  100 mg Oral QHS Sharma Covert, MD   100 mg at 07/12/18 2206  . vitamin C (ASCORBIC ACID) tablet 1,000 mg  1,000 mg Oral BID Lavella Hammock, MD   1,000 mg at 07/13/18 1448    Lab Results: No results found for this or any previous visit (from the past 48 hour(s)).  Blood Alcohol level:  Lab Results  Component Value Date   ETH <10 07/08/2018   ETH <10 18/56/3149    Metabolic Disorder Labs: Lab Results  Component Value Date   HGBA1C 5.2 07/08/2018   MPG 102.54 07/08/2018   No results found for: PROLACTIN Lab Results  Component Value Date   CHOL 218 (H) 07/08/2018   TRIG 37 07/08/2018   HDL 45 07/08/2018   CHOLHDL 4.8 07/08/2018   VLDL 7 07/08/2018   LDLCALC 166 (H) 07/08/2018    Physical Findings: AIMS:  , ,  ,  ,    CIWA:    COWS:     Musculoskeletal: Strength & Muscle Tone: within normal limits Gait & Station:  normal Patient leans: N/A  Psychiatric Specialty Exam: Physical Exam  Nursing note and vitals reviewed. Constitutional: He appears well-developed and well-nourished.  HENT:  Head: Normocephalic and atraumatic.  Eyes: Pupils are equal, round, and reactive to light. Conjunctivae are normal.  Neck: Normal range of motion.  Cardiovascular: Regular rhythm and normal heart sounds.  Respiratory: Effort normal. No respiratory distress.  GI: Soft.  Musculoskeletal: Normal range of motion.  Neurological: He is alert.  Skin: Skin is warm and dry.  Psychiatric: He has a normal mood and affect. His speech is normal and behavior is normal. Judgment and thought content normal. Cognition and memory are normal.    Review of Systems  Constitutional: Negative.   HENT: Negative.   Eyes: Negative.   Respiratory: Negative.   Cardiovascular: Negative.   Gastrointestinal: Negative.   Musculoskeletal: Negative.   Skin: Negative.   Neurological: Negative.   Psychiatric/Behavioral: Negative.     Blood pressure 121/82, pulse 98, temperature 98 F (36.7 C), temperature source Oral, resp. rate 18, height 5\' 10"  (1.778 m), weight 63 kg, SpO2 100 %.Body mass index is 19.93 kg/m.  General Appearance: Casual  Eye Contact:  Fair  Speech:  Normal Rate  Volume:  Normal  Mood:  Euthymic  Affect:  Constricted  Thought Process:  Goal Directed  Orientation:  Full (Time, Place, and Person)  Thought Content:  Logical  Suicidal Thoughts:  No  Homicidal Thoughts:  No  Memory:  Immediate;   Fair Recent;   Fair Remote;   Fair  Judgement:  Fair  Insight:  Fair  Psychomotor Activity:  Decreased  Concentration:  Concentration: Fair  Recall:  [employer] of Knowledge:  Fair  Language:  Fair  Akathisia:  No  Handed:  Right  AIMS (if indicated):     Assets:  Desire for Improvement Housing  ADL's:  Intact  Cognition:  WNL  Sleep:  Number of Hours: 6     Treatment Plan Summary: Daily contact with patient  to assess and evaluate symptoms and progress in treatment, Medication management and Plan Patient appears to be doing well with good control of psychotic symptoms.  No sign of acute dangerousness.  No agitation no threatening behavior.  Patient no longer meets commitment criteria and does not require inpatient treatment.  Plan is likely discharge tomorrow with referral for follow-up treatment in the community  Alethia Berthold, MD 07/13/2018, 5:21 PM

## 2018-07-13 NOTE — Plan of Care (Signed)
Pt. Monitored for safety by staff. Pt. Able to deny si/hi/avh, contract for safety. Pt. Thought process is disorganized, bizarre, and religiously preoccupied. Pt. Frequently requires redirection from inappropriate yelling around the unit quoting religious doctrine.    Problem: Education: Goal: Utilization of techniques to improve thought processes will improve Outcome: Not Progressing   Problem: Safety: Goal: Periods of time without injury will increase Outcome: Progressing   Problem: Safety: Goal: Ability to disclose and discuss suicidal ideas will improve Outcome: Progressing

## 2018-07-13 NOTE — Progress Notes (Addendum)
D: Pt during assessments is hyper-religous and not able to hold a productive conversation very much. Pt. Able to deny si/hi/avh, contract for safety though. Pt. In his presentation is animated and bizarre. Pt. Thought process is disorganized.   A: Q x 15 minute observation checks were completed for safety. Patient was provided with education. Patient was given/offered medications per orders. Patient  was encourage to attend groups, participate in unit activities and continue with plan of care. Pt. Chart and plans of care reviewed. Pt. Given support and encouragement.   R: Patient initially is non-complaint with medications stating, "I only take vitamins", but with patient education from another RN, patient is eventually complaint with taking scheduled medications. Pt. Frequently requires staff redirection from yelling in the day room randomly in a disorganized and religiously preoccupied manor. Pt. When not having yelling outbursts is mostly appropriate in his behavior, but very resistant to care overall. Pt. This shift is very not complaint with supportive devices for his neck and leg for injuries prior to admissions. Pt. When educated several times on need for using supportive devices refuses. Pt. Instructed on high falls risk safety measures for his safety, but refuses them all, stating, "I don't need falls safety, I'm walking fine". Pt. Observed shuffling around the unit, mostly steady. Pt. Pain being addressed utilizing MD orders. Pt. Also periodically gets down on his knees and starts praying out loud very loudly.             Precautionary checks every 15 minutes for safety maintained, room free of safety hazards, patient sustains no injury or falls during this shift. Will endorse care to next shift.

## 2018-07-14 MED ORDER — GABAPENTIN 300 MG PO CAPS: 300.0000 mg | ORAL_CAPSULE | Freq: Three times a day (TID) | ORAL | 1 refills | Status: DC

## 2018-07-14 MED ORDER — OLANZAPINE 5 MG PO TABS: 5.0000 mg | ORAL_TABLET | Freq: Two times a day (BID) | ORAL | Status: DC

## 2018-07-14 MED ORDER — POLYETHYLENE GLYCOL 3350 17 G PO PACK: 17.0000 g | PACK | Freq: Every day | ORAL | 0 refills | Status: DC

## 2018-07-14 MED ORDER — OLANZAPINE 5 MG PO TBDP: 5.0000 mg | ORAL_TABLET | Freq: Two times a day (BID) | ORAL | 0 refills | Status: DC

## 2018-07-14 MED ORDER — QUETIAPINE FUMARATE 200 MG PO TABS: 200.0000 mg | ORAL_TABLET | Freq: Every day | ORAL | 1 refills | Status: DC

## 2018-07-14 MED ORDER — METHOCARBAMOL 500 MG PO TABS: 500.0000 mg | ORAL_TABLET | Freq: Four times a day (QID) | ORAL | 1 refills | Status: DC | PRN

## 2018-07-14 MED ORDER — QUETIAPINE FUMARATE 200 MG PO TABS: 200.0000 mg | ORAL_TABLET | Freq: Every day | ORAL | Status: DC

## 2018-07-14 MED ORDER — OLANZAPINE 5 MG PO TABS: 5.0000 mg | ORAL_TABLET | Freq: Two times a day (BID) | ORAL | 1 refills | Status: DC

## 2018-07-14 MED ORDER — TRAZODONE HCL 100 MG PO TABS: 100.0000 mg | ORAL_TABLET | Freq: Every day | ORAL | 1 refills | Status: DC

## 2018-07-14 MED ORDER — POLYETHYLENE GLYCOL 3350 17 G PO PACK: 17.0000 g | PACK | Freq: Every day | ORAL | 1 refills | Status: DC

## 2018-07-14 MED ORDER — GABAPENTIN 300 MG PO CAPS: 300.0000 mg | ORAL_CAPSULE | Freq: Three times a day (TID) | ORAL | 0 refills | Status: DC

## 2018-07-14 MED ORDER — QUETIAPINE FUMARATE 200 MG PO TABS: 200.0000 mg | ORAL_TABLET | Freq: Every day | ORAL | 0 refills | Status: DC

## 2018-07-14 MED ORDER — GABAPENTIN 300 MG PO CAPS: 300.0000 mg | ORAL_CAPSULE | Freq: Three times a day (TID) | ORAL | Status: DC

## 2018-07-14 MED ORDER — ASPIRIN 325 MG PO TBEC: 325.0000 mg | DELAYED_RELEASE_TABLET | Freq: Every day | ORAL | 1 refills | Status: DC

## 2018-07-14 MED ORDER — OLANZAPINE 5 MG PO TBDP: 10.0000 mg | ORAL_TABLET | Freq: Every day | ORAL | Status: DC

## 2018-07-14 MED ORDER — TRAZODONE HCL 100 MG PO TABS: 100.0000 mg | ORAL_TABLET | Freq: Every day | ORAL | 0 refills | Status: DC

## 2018-07-14 NOTE — Plan of Care (Signed)
  Problem: Education: Goal: Knowledge of [facility] Education information/materials will improve Outcome: Adequate for Discharge   Problem: Safety: Goal: Periods of time without injury will increase Outcome: Adequate for Discharge   Problem: Education: Goal: Utilization of techniques to improve thought processes will improve Outcome: Adequate for Discharge Goal: Knowledge of the prescribed therapeutic regimen will improve Outcome: Adequate for Discharge   Problem: Safety: Goal: Ability to disclose and discuss suicidal ideas will improve Outcome: Adequate for Discharge Goal: Ability to identify and utilize support systems that promote safety will improve Outcome: Adequate for Discharge   Problem: Group Participation Goal: STG - Patient will engage in groups without prompting or encouragement from LRT x3 group sessions within 5 recreation therapy group sessions Description: STG - Patient will engage in groups without prompting or encouragement from LRT x3 group sessions within 5 recreation therapy group sessions Outcome: Adequate for Discharge

## 2018-07-14 NOTE — Progress Notes (Signed)
Patient is improving and doing well with his medication regimen, have not requested any PRN, sleep is adequate , appetite is good and well hydrated with fluids and juices. Patient is socializing with peers, participate in scheduled peer activities, with out any issues , patient denies any SI/HI/AVH at this time , only requiring 15 minutes safety rounds no distress.

## 2018-07-14 NOTE — Progress Notes (Signed)
Recreation Therapy Notes  INPATIENT RECREATION TR PLAN  Patient Details Name: Richard Vasquez MRN: [id number] DOB: September 10, 1960 Today's Date: 07/14/2018  Rec Therapy Plan Is patient appropriate for Therapeutic Recreation?: Yes Treatment times per week: at least 3 Estimated Length of Stay: 5-7 days TR Treatment/Interventions: Group participation (Comment)  Discharge Criteria Pt will be discharged from therapy if:: Discharged Treatment plan/goals/alternatives discussed and agreed upon by:: Patient/family  Discharge Summary Short term goals set: Patient will engage in groups without prompting or encouragement from LRT x3 group sessions within 5 recreation therapy group sessions Short term goals met: Complete Progress toward goals comments: Groups attended Which groups?: Other (Comment), Coping skills(Problem Solving. Necessities) Reason goals not met: N/A Therapeutic equipment acquired: N/A Reason patient discharged from therapy: Discharge from hospital Pt/family agrees with progress & goals achieved: Yes Date patient discharged from therapy: 07/14/18   Marty Sadlowski 07/14/2018, 2:56 PM

## 2018-07-14 NOTE — Discharge Summary (Signed)
Physician Discharge Summary Note  Patient:  Richard Vasquez is an 58 y.o., male MRN:  [id number] DOB:  [birth year] Patient phone:  [phone] (home)  Patient address:   [address] [place] [address],  Total Time spent with patient: 45 minutes  Date of Admission:  07/08/2018 Date of Discharge: July 14, 2018  Reason for Admission: Admitted to the hospital because of agitation disorganized behavior paranoia and psychosis in the emergency room  Principal Problem: Schizophrenia [facility]) Discharge Diagnoses: Principal Problem:   Schizophrenia ([place]) Active Problems:   Closed displaced supracondylar fracture of distal end of right femur with intracondylar extension ([place])   C2 cervical fracture ([place])   Past Psychiatric History: Past history of schizophrenia  Past Medical History:  Past Medical History:  Diagnosis Date  . BPH (benign prostatic hyperplasia)   . Chronic pain   . Closed fracture of distal end of right femur ([place]) 06/09/2018    Past Surgical History:  Procedure Laterality Date  . ORIF FEMUR FRACTURE Right 06/10/2018   Procedure: OPEN REDUCTION INTERNAL FIXATION (ORIF) DISTAL FEMUR FRACTURE;  Surgeon: Shona Needles, MD;  Location: [place];  Service: Orthopedics;  Laterality: Right;   Family History: History reviewed. No pertinent family history. Family Psychiatric  History: None reported Social History:  Social History   Substance and Sexual Activity  Alcohol Use Not Currently     Social History   Substance and Sexual Activity  Drug Use Not Currently    Social History   Socioeconomic History  . Marital status: Divorced    Spouse name: Not on file  . Number of children: Not on file  . Years of education: Not on file  . Highest education level: Not on file  Occupational History  . Not on file  Social Needs  . Financial resource strain: Not on file  . Food insecurity    Worry: Not on file    Inability: Not on file  . Transportation needs    Medical: Not  on file    Non-medical: Not on file  Tobacco Use  . Smoking status: Current Every Day Smoker    Packs/day: 2.00    Years: 20.00    Pack years: 40.00    Types: Cigarettes  . Smokeless tobacco: Never Used  Substance and Sexual Activity  . Alcohol use: Not Currently  . Drug use: Not Currently  . Sexual activity: Not on file  Lifestyle  . Physical activity    Days per week: 5 days    Minutes per session: 100 min  . Stress: Very much  Relationships  . Social Herbalist on phone: Not on file    Gets together: Not on file    Attends religious service: Not on file    Active member of club or organization: Not on file    Attends meetings of clubs or organizations: Not on file    Relationship status: Not on file  Other Topics Concern  . Not on file  Social History Narrative  . Not on file    Hospital Course: Patient admitted to the hospital disorganized and psychotic but after only a day of treatment with antipsychotic medicine showed dramatic improvement.  Mood stabilized.  Stop being irritable and hostile.  Able to hold lucid conversations.  On modest dose antipsychotic hallucinations disappeared.  Patient was able to be cooperative with the unit interactive and appropriate and was medication compliant.  Patient reported that he believed he could still go back  to stay with the woman he had been staying with before he came into the hospital.  On the last day he was able to call her and confirm this.  Patient was given a 7-day supply of medicine and prescriptions and encouraged to follow-up with outpatient treatment with [facility].  Physical Findings: AIMS:  , ,  ,  ,    CIWA:    COWS:     Musculoskeletal: Strength & Muscle Tone: within normal limits Gait & Station: normal Patient leans: N/A  Psychiatric Specialty Exam: Physical Exam  Nursing note and vitals reviewed. Constitutional: He appears well-developed and well-nourished.  HENT:  Head: Normocephalic and atraumatic.   Eyes: Pupils are equal, round, and reactive to light. Conjunctivae are normal.  Neck: Normal range of motion.  Cardiovascular: Regular rhythm and normal heart sounds.  Respiratory: Effort normal.  GI: Soft.  Musculoskeletal: Normal range of motion.  Neurological: He is alert.  Skin: Skin is warm and dry.  Psychiatric: He has a normal mood and affect. His behavior is normal. Judgment and thought content normal.    Review of Systems  Constitutional: Negative.   HENT: Negative.   Eyes: Negative.   Respiratory: Negative.   Cardiovascular: Negative.   Gastrointestinal: Negative.   Musculoskeletal: Negative.   Skin: Negative.   Neurological: Negative.   Psychiatric/Behavioral: Negative.     Blood pressure (!) 97/58, pulse (!) 115, temperature 98.6 F (37 C), temperature source Oral, resp. rate 18, height 5\' 10"  (1.778 m), weight 63 kg, SpO2 99 %.Body mass index is 19.93 kg/m.  General Appearance: Casual  Eye Contact:  Good  Speech:  Normal Rate  Volume:  Normal  Mood:  Euthymic  Affect:  Congruent  Thought Process:  Goal Directed  Orientation:  Full (Time, Place, and Person)  Thought Content:  Logical  Suicidal Thoughts:  No  Homicidal Thoughts:  No  Memory:  Immediate;   Fair Recent;   Fair Remote;   Fair  Judgement:  Fair  Insight:  Fair  Psychomotor Activity:  Normal  Concentration:  Concentration: Fair  Recall:  [place] of Knowledge:  Fair  Language:  Fair  Akathisia:  No  Handed:  Right  AIMS (if indicated):     Assets:  Desire for Improvement  ADL's:  Intact  Cognition:  WNL  Sleep:  Number of Hours: 6     Have you used any form of tobacco in the last 30 days? (Cigarettes, Smokeless Tobacco, Cigars, and/or Pipes): Yes  Has this patient used any form of tobacco in the last 30 days? (Cigarettes, Smokeless Tobacco, Cigars, and/or Pipes) Yes, No  Blood Alcohol level:  Lab Results  Component Value Date   ETH <10 07/08/2018   ETH <10 50/53/9767     Metabolic Disorder Labs:  Lab Results  Component Value Date   HGBA1C 5.2 07/08/2018   MPG 102.54 07/08/2018   No results found for: PROLACTIN Lab Results  Component Value Date   CHOL 218 (H) 07/08/2018   TRIG 37 07/08/2018   HDL 45 07/08/2018   CHOLHDL 4.8 07/08/2018   VLDL 7 07/08/2018   LDLCALC 166 (H) 07/08/2018    See Psychiatric Specialty Exam and Suicide Risk Assessment completed by Attending Physician prior to discharge.  Discharge destination:  Home  Is patient on multiple antipsychotic therapies at discharge:  Yes,   Do you recommend tapering to monotherapy for antipsychotics?  Yes   Has Patient had three or more failed trials of antipsychotic monotherapy by  history:  No  Recommended Plan for Multiple Antipsychotic Therapies: Taper to monotherapy as described:  Taper by gradually decreasing the Seroquel  Discharge Instructions    Diet - low sodium heart healthy   Complete by: As directed    Increase activity slowly   Complete by: As directed      Allergies as of 07/14/2018   No Known Allergies     Medication List    STOP taking these medications   acetaminophen 325 MG tablet Commonly known as: TYLENOL   Fish Oil 1000 MG Caps   multivitamin with minerals Tabs tablet   nicotine 7 mg/24hr patch Commonly known as: NICODERM CQ - dosed in mg/24 hr   vitamin C 1000 MG tablet     TAKE these medications     Indication  aspirin 325 MG EC tablet Take 1 tablet (325 mg total) by mouth daily.  Indication: Acute Heart Attack   docusate sodium 100 MG capsule Commonly known as: COLACE Take 1 capsule (100 mg total) by mouth 2 (two) times daily.  Indication: Constipation   gabapentin 300 MG capsule Commonly known as: NEURONTIN Take 1 capsule (300 mg total) by mouth 3 (three) times daily. What changed:   medication strength  how much to take  Indication: Fibromyalgia Syndrome   methocarbamol 500 MG tablet Commonly known as: ROBAXIN Take 1 tablet  (500 mg total) by mouth every 6 (six) hours as needed for muscle spasms.  Indication: Musculoskeletal Pain   OLANZapine 5 MG tablet Commonly known as: ZyPREXA Take 1 tablet (5 mg total) by mouth 2 (two) times a day.  Indication: Schizophrenia   OLANZapine 5 MG tablet Commonly known as: ZYPREXA Take 1 tablet (5 mg total) by mouth 2 (two) times a day.  Indication: Schizophrenia   oxyCODONE 5 MG immediate release tablet Commonly known as: Oxy IR/ROXICODONE Take 1 tablet (5 mg total) by mouth every 6 (six) hours as needed for severe pain.  Indication: Chronic Pain   polyethylene glycol 17 g packet Commonly known as: MIRALAX / GLYCOLAX Take 17 g by mouth daily.  Indication: Constipation   QUEtiapine 200 MG tablet Commonly known as: SEROQUEL Take 1 tablet (200 mg total) by mouth at bedtime.  Indication: Schizophrenia   traZODone 100 MG tablet Commonly known as: DESYREL Take 1 tablet (100 mg total) by mouth at bedtime.  Indication: [place] Follow up on 07/20/2018.   Why: Please follow up with [facility] on Monday, June 22nd at 12:30pm. Please take your hospital discharge paperwork with you to your appointment.  Thank you. Contact information: [place] [address] [phone]           Follow-up recommendations:  Activity:  Activity as tolerated Diet:  Regular diet Other:  Outpatient treatment with [facility]  Comments: Patient did not appear to be agitated confused psychotic or have any suicidal thoughts at the time of discharge  Signed: Alethia Berthold, MD 07/14/2018, 5:45 PM

## 2018-07-14 NOTE — Progress Notes (Signed)
  [facility] Adult Case Management Discharge Plan :  Will you be returning to the same living situation after discharge:  Yes,  PT may be returning with a friend, if not pt will stay with another friend At discharge, do you have transportation home?: Yes,  taxi will be provided Do you have the ability to pay for your medications: Yes,  mental health  Release of information consent forms completed and in the chart;  Patient to Follow up at: Follow-up Information    [place] Follow up on 07/20/2018.   Why: Please follow up with [facility] on Monday, June 22nd at 12:30pm. Please take your hospital discharge paperwork with you to your appointment.  Thank you. Contact information: [place] [address] [phone]           Next level of care provider has access to [place] and Suicide Prevention discussed: Yes,  SPE completed with Carmina Miller  Have you used any form of tobacco in the last 30 days? (Cigarettes, Smokeless Tobacco, Cigars, and/or Pipes): Yes  Has patient been referred to the Quitline?: Patient refused referral  Patient has been referred for addiction treatment: [place], LCSW 07/14/2018, 11:07 AM

## 2018-07-14 NOTE — Progress Notes (Signed)
Recreation Therapy Notes  Date: 07/14/2018  Time: 9:30 am  Location: Craft room  Behavioral response: Appropriate  Intervention Topic: Coping Skills  Discussion/Intervention:  Group content on today was focused on coping skills. The group defined what coping skills are and when they can be used. Individuals described how they normally cope with thing and the coping skills they normally use. Patients expressed why it is important to cope with things and how not coping with things can affect you. The group participated in the intervention "My coping box" and made coping boxes while adding coping skills they could use in the future to the box. Clinical Observations/Feedback:  Patient came to group and was focused on what peers and staff had to say about coping skills.He was pulled from group by social work and later returend. Individual was social with peers and staff while participating in the intervention. Monta Maiorana LRT/CTRS         Mercades Bajaj 07/14/2018 11:29 AM

## 2018-07-14 NOTE — Progress Notes (Signed)
D: Patient is aware of  Discharge this shift .Patient denies suicidal /homicidal ideations. Patient received all belongings brought in  A: No Storage medications. Writer reviewed Discharge Summary, Suicide Risk Assessment, and Transitional Record. Patient also received Prescriptions   from  MD. A 7 day supply of medications given to patient  . Aware  Of follow up appointment . R: Patient left unit with no questions  Or concerns  With taxi.

## 2018-07-14 NOTE — BHH Group Notes (Signed)
Feelings Around Diagnosis 07/14/2018 1PM  Type of Therapy/Topic:  Group Therapy:  Feelings about Diagnosis  Participation Level:  Did Not Attend   Description of Group:   This group will allow patients to explore their thoughts and feelings about diagnoses they have received. Patients will be guided to explore their level of understanding and acceptance of these diagnoses. Facilitator will encourage patients to process their thoughts and feelings about the reactions of others to their diagnosis and will guide patients in identifying ways to discuss their diagnosis with significant others in their lives. This group will be process-oriented, with patients participating in exploration of their own experiences, giving and receiving support, and processing challenge from other group members.   Therapeutic Goals: 1. Patient will demonstrate understanding of diagnosis as evidenced by identifying two or more symptoms of the disorder 2. Patient will be able to express two feelings regarding the diagnosis 3. Patient will demonstrate their ability to communicate their needs through discussion and/or role play  Summary of Patient Progress:       Therapeutic Modalities:   Cognitive Behavioral Therapy Brief Therapy Feelings Identification    Yvette Rack, LCSW 07/14/2018 2:13 PM

## 2018-07-14 NOTE — Plan of Care (Signed)
  Problem: Education: Goal: Knowledge of [place] [facility] Education information/materials will improve Outcome: Progressing   Problem: Safety: Goal: Periods of time without injury will increase Outcome: Progressing   Problem: Education: Goal: Utilization of techniques to improve thought processes will improve Outcome: Progressing Goal: Knowledge of the prescribed therapeutic regimen will improve Outcome: Progressing   Problem: Safety: Goal: Ability to disclose and discuss suicidal ideas will improve Outcome: Progressing Goal: Ability to identify and utilize support systems that promote safety will improve Outcome: Progressing

## 2018-07-14 NOTE — BHH Suicide Risk Assessment (Signed)
[facility] Discharge Suicide Risk Assessment   Principal Problem: Schizophrenia [facility]) Discharge Diagnoses: Principal Problem:   Schizophrenia ([place]) Active Problems:   Closed displaced supracondylar fracture of distal end of right femur with intracondylar extension ([place])   C2 cervical fracture ([place])   Total Time spent with patient: 45 minutes  Musculoskeletal: Strength & Muscle Tone: within normal limits Gait & Station: normal Patient leans: N/A  Psychiatric Specialty Exam: Review of Systems  Constitutional: Negative.   HENT: Negative.   Eyes: Negative.   Respiratory: Negative.   Cardiovascular: Negative.   Gastrointestinal: Negative.   Musculoskeletal: Negative.   Skin: Negative.   Neurological: Negative.   Psychiatric/Behavioral: Negative.     Blood pressure (!) 97/58, pulse (!) 115, temperature 98.6 F (37 C), temperature source Oral, resp. rate 18, height 5\' 10"  (1.778 m), weight 63 kg, SpO2 99 %.Body mass index is 19.93 kg/m.  General Appearance: Casual  Eye Contact::  Fair  Speech:  Clear and [id number]  Volume:  Normal  Mood:  Euthymic  Affect:  Congruent  Thought Process:  Goal Directed  Orientation:  Full (Time, Place, and Person)  Thought Content:  Logical  Suicidal Thoughts:  No  Homicidal Thoughts:  No  Memory:  Immediate;   Fair Recent;   Fair Remote;   Fair  Judgement:  Fair  Insight:  Fair  Psychomotor Activity:  Normal  Concentration:  Fair  Recall:  [employer] of Knowledge:Fair  Language: Fair  Akathisia:  No  Handed:  Right  AIMS (if indicated):     Assets:  Desire for Improvement Resilience  Sleep:  Number of Hours: 6  Cognition: WNL  ADL's:  Intact   Mental Status Per Nursing Assessment::   On Admission:  NA  Demographic Factors:  Male, Divorced or widowed, Caucasian and Unemployed  Loss Factors: Decline in physical health  Historical Factors: Impulsivity  Risk Reduction Factors:   Religious beliefs about death and Positive  social support  Continued Clinical Symptoms:  Schizophrenia:   Paranoid or undifferentiated type  Cognitive Features That Contribute To Risk:  Loss of executive function    Suicide Risk:  Minimal: No identifiable suicidal ideation.  Patients presenting with no risk factors but with morbid ruminations; may be classified as minimal risk based on the severity of the depressive symptoms  Follow-up Information    [place] Follow up on 07/20/2018.   Why: Please follow up with [facility] on Monday, June 22nd at 12:30pm. Please take your hospital discharge paperwork with you to your appointment.  Thank you. Contact information: [place] [address] [phone]           Plan Of Care/Follow-up recommendations:  Activity:  Activity as tolerated Diet:  Regular diet Other:  Follow-up with [facility] as already arranged.  Continue on current medicine.  Contact neurosurgery as please we have previously recommended to follow-up on your neck.  Alethia Berthold, MD 07/14/2018, 2:38 PM

## 2018-07-23 ENCOUNTER — Other Ambulatory Visit: Payer: Self-pay | Admitting: Psychiatry

## 2018-07-23 MED ORDER — GABAPENTIN 300 MG PO CAPS: 300.0000 mg | ORAL_CAPSULE | Freq: Three times a day (TID) | ORAL | 1 refills | Status: DC

## 2018-07-23 MED ORDER — QUETIAPINE FUMARATE 200 MG PO TABS: 200.0000 mg | ORAL_TABLET | Freq: Every day | ORAL | 1 refills | Status: DC

## 2018-07-23 MED ORDER — POLYETHYLENE GLYCOL 3350 17 G PO PACK: 17.0000 g | PACK | Freq: Every day | ORAL | 1 refills | Status: DC

## 2018-07-23 MED ORDER — OLANZAPINE 5 MG PO TABS: 5.0000 mg | ORAL_TABLET | Freq: Two times a day (BID) | ORAL | 1 refills | Status: DC

## 2018-07-23 MED ORDER — TRAZODONE HCL 100 MG PO TABS: 100.0000 mg | ORAL_TABLET | Freq: Every day | ORAL | 1 refills | Status: DC

## 2018-07-23 MED ORDER — METHOCARBAMOL 500 MG PO TABS: 500.0000 mg | ORAL_TABLET | Freq: Four times a day (QID) | ORAL | 1 refills | Status: DC | PRN

## 2018-07-23 MED ORDER — DOCUSATE SODIUM 100 MG PO CAPS: 100.0000 mg | ORAL_CAPSULE | Freq: Two times a day (BID) | ORAL | 1 refills | Status: DC

## 2018-07-23 MED ORDER — ASPIRIN 325 MG PO TBEC: 325.0000 mg | DELAYED_RELEASE_TABLET | Freq: Every day | ORAL | 1 refills | Status: DC

## 2018-08-17 ENCOUNTER — Emergency Department: Payer: Medicaid Other

## 2018-08-17 ENCOUNTER — Encounter: Payer: Self-pay | Admitting: *Deleted

## 2018-08-17 ENCOUNTER — Other Ambulatory Visit: Payer: Self-pay

## 2018-08-17 ENCOUNTER — Inpatient Hospital Stay
Admission: EM | Admit: 2018-08-17 | Discharge: 2018-08-31 | DRG: 480 | Disposition: A | Discharge status: Home / Self Care | Payer: Medicaid Other | Attending: Internal Medicine | Admitting: Internal Medicine

## 2018-08-17 DIAGNOSIS — N400 Enlarged prostate without lower urinary tract symptoms: Secondary | ICD-10-CM | POA: Diagnosis present

## 2018-08-17 DIAGNOSIS — S82201A Unspecified fracture of shaft of right tibia, initial encounter for closed fracture: Secondary | ICD-10-CM

## 2018-08-17 DIAGNOSIS — S82401A Unspecified fracture of shaft of right fibula, initial encounter for closed fracture: Secondary | ICD-10-CM | POA: Diagnosis present

## 2018-08-17 DIAGNOSIS — F209 Schizophrenia, unspecified: Secondary | ICD-10-CM | POA: Diagnosis present

## 2018-08-17 DIAGNOSIS — G8929 Other chronic pain: Secondary | ICD-10-CM | POA: Diagnosis present

## 2018-08-17 DIAGNOSIS — S12101D Unspecified nondisplaced fracture of second cervical vertebra, subsequent encounter for fracture with routine healing: Secondary | ICD-10-CM

## 2018-08-17 DIAGNOSIS — Z23 Encounter for immunization: Secondary | ICD-10-CM

## 2018-08-17 DIAGNOSIS — Z419 Encounter for procedure for purposes other than remedying health state, unspecified: Secondary | ICD-10-CM

## 2018-08-17 DIAGNOSIS — M25061 Hemarthrosis, right knee: Secondary | ICD-10-CM | POA: Diagnosis present

## 2018-08-17 DIAGNOSIS — S72002A Fracture of unspecified part of neck of left femur, initial encounter for closed fracture: Secondary | ICD-10-CM

## 2018-08-17 DIAGNOSIS — F29 Unspecified psychosis not due to a substance or known physiological condition: Secondary | ICD-10-CM | POA: Diagnosis present

## 2018-08-17 DIAGNOSIS — Z6821 Body mass index (BMI) 21.0-21.9, adult: Secondary | ICD-10-CM

## 2018-08-17 DIAGNOSIS — X810XXA Intentional self-harm by jumping or lying in front of motor vehicle, initial encounter: Secondary | ICD-10-CM

## 2018-08-17 DIAGNOSIS — S82144A Nondisplaced bicondylar fracture of right tibia, initial encounter for closed fracture: Secondary | ICD-10-CM | POA: Diagnosis present

## 2018-08-17 DIAGNOSIS — E43 Unspecified severe protein-calorie malnutrition: Secondary | ICD-10-CM | POA: Insufficient documentation

## 2018-08-17 DIAGNOSIS — S01112A Laceration without foreign body of left eyelid and periocular area, initial encounter: Secondary | ICD-10-CM | POA: Diagnosis present

## 2018-08-17 DIAGNOSIS — Z590 Homelessness: Secondary | ICD-10-CM

## 2018-08-17 DIAGNOSIS — L8990 Pressure ulcer of unspecified site, unspecified stage: Secondary | ICD-10-CM | POA: Insufficient documentation

## 2018-08-17 DIAGNOSIS — Z7982 Long term (current) use of aspirin: Secondary | ICD-10-CM

## 2018-08-17 DIAGNOSIS — D62 Acute posthemorrhagic anemia: Secondary | ICD-10-CM | POA: Diagnosis not present

## 2018-08-17 DIAGNOSIS — S72009A Fracture of unspecified part of neck of unspecified femur, initial encounter for closed fracture: Secondary | ICD-10-CM

## 2018-08-17 DIAGNOSIS — F17210 Nicotine dependence, cigarettes, uncomplicated: Secondary | ICD-10-CM | POA: Diagnosis present

## 2018-08-17 DIAGNOSIS — Z20828 Contact with and (suspected) exposure to other viral communicable diseases: Secondary | ICD-10-CM | POA: Diagnosis present

## 2018-08-17 DIAGNOSIS — X58XXXD Exposure to other specified factors, subsequent encounter: Secondary | ICD-10-CM | POA: Diagnosis present

## 2018-08-17 LAB — COMPREHENSIVE METABOLIC PANEL
ALT: 18 U/L (ref 0–44)
AST: 25 U/L (ref 15–41)
Albumin: 3.9 g/dL (ref 3.5–5.0)
Alkaline Phosphatase: 64 U/L (ref 38–126)
Anion gap: 10 (ref 5–15)
BUN: 23 mg/dL — ABNORMAL HIGH (ref 6–20)
CO2: 22 mmol/L (ref 22–32)
Calcium: 9.1 mg/dL (ref 8.9–10.3)
Chloride: 107 mmol/L (ref 98–111)
Creatinine, Ser: 0.72 mg/dL (ref 0.61–1.24)
GFR calc Af Amer: >60 mL/min (ref >60)
GFR calc non Af Amer: >60 mL/min (ref >60)
Glucose, Bld: 94 mg/dL (ref 70–99)
Potassium: 3.7 mmol/L (ref 3.5–5.1)
Sodium: 139 mmol/L (ref 135–145)
Total Bilirubin: 0.8 mg/dL (ref 0.3–1.2)
Total Protein: 7 g/dL (ref 6.5–8.1)

## 2018-08-17 LAB — CBC
HCT: 35.1 % — ABNORMAL LOW (ref 39.0–52.0)
Hemoglobin: 11.6 g/dL — ABNORMAL LOW (ref 13.0–17.0)
MCH: 30.4 pg (ref 26.0–34.0)
MCHC: 33 g/dL (ref 30.0–36.0)
MCV: 91.9 fL (ref 80.0–100.0)
Platelets: 237 10*3/uL (ref 150–400)
RBC: 3.82 MIL/uL — ABNORMAL LOW (ref 4.22–5.81)
RDW: 12.3 % (ref 11.5–15.5)
WBC: 8.2 10*3/uL (ref 4.0–10.5)
nRBC: 0 % (ref 0.0–0.2)

## 2018-08-17 LAB — ETHANOL: Alcohol, Ethyl (B): <10 mg/dL (ref <10)

## 2018-08-17 LAB — SAMPLE TO BLOOD BANK

## 2018-08-17 LAB — PROTIME-INR
INR: 1 (ref 0.8–1.2)
Prothrombin Time: 12.9 seconds (ref 11.4–15.2)

## 2018-08-17 LAB — LACTIC ACID, PLASMA: Lactic Acid, Venous: 0.9 mmol/L (ref 0.5–1.9)

## 2018-08-17 MED ORDER — TETANUS-DIPHTH-ACELL PERTUSSIS 5-2.5-18.5 LF-MCG/0.5 IM SUSP
0.5000 mL | Freq: Once | INTRAMUSCULAR | Status: AC
  Administered 2018-08-17: 0.5 mL via INTRAMUSCULAR
  Filled 2018-08-17: qty 0.5

## 2018-08-17 MED ORDER — FENTANYL CITRATE (PF) 100 MCG/2ML IJ SOLN
50.0000 ug | Freq: Once | INTRAMUSCULAR | Status: AC
  Administered 2018-08-17: 50 ug via INTRAVENOUS
  Filled 2018-08-17: qty 2

## 2018-08-17 MED ORDER — IOHEXOL 300 MG/ML  SOLN
100.0000 mL | Freq: Once | INTRAMUSCULAR | Status: AC | PRN
  Administered 2018-08-17: 100 mL via INTRAVENOUS
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
## 2018-08-17 NOTE — ED Notes (Signed)
EKG performed and blood drawn
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
## 2018-08-17 NOTE — ED Notes (Signed)
Patient transported to CT 

## 2018-08-17 NOTE — ED Notes (Signed)
IVC, came in on IVC, pending medical clearance

## 2018-08-17 NOTE — ED Notes (Signed)
X-ray in with pt 

## 2018-08-17 NOTE — ED Notes (Signed)
Pt returned from CT °

## 2018-08-17 NOTE — ED Notes (Signed)
ED Provider at bedside. 

## 2018-08-17 NOTE — ED Notes (Signed)
Knee immobilizer placed on pt's right knee. Patient tolerated well.
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
## 2018-08-17 NOTE — ED Notes (Addendum)
Pt returned from CT °

## 2018-08-17 NOTE — ED Notes (Signed)
Pain meds given.  Pt back to ct scan

## 2018-08-17 NOTE — ED Notes (Signed)
Pain meds given  Pt alert.

## 2018-08-17 NOTE — ED Notes (Signed)
Report off to [employer]

## 2018-08-17 NOTE — ED Notes (Signed)
Return from ct scan  Pt alert. Iv in place.  nsr on monitor.

## 2018-08-17 NOTE — ED Notes (Signed)
nsr on monitor.  Pt alert.  Iv in place

## 2018-08-18 ENCOUNTER — Other Ambulatory Visit: Payer: Self-pay

## 2018-08-18 ENCOUNTER — Inpatient Hospital Stay: Payer: Medicaid Other | Admitting: Anesthesiology

## 2018-08-18 ENCOUNTER — Encounter
Admission: EM | Disposition: A | Discharge status: Home / Self Care | Payer: Self-pay | Source: Home / Self Care | Attending: Internal Medicine

## 2018-08-18 ENCOUNTER — Inpatient Hospital Stay: Payer: Medicaid Other

## 2018-08-18 DIAGNOSIS — G8929 Other chronic pain: Secondary | ICD-10-CM | POA: Diagnosis present

## 2018-08-18 DIAGNOSIS — D62 Acute posthemorrhagic anemia: Secondary | ICD-10-CM | POA: Diagnosis not present

## 2018-08-18 DIAGNOSIS — X58XXXD Exposure to other specified factors, subsequent encounter: Secondary | ICD-10-CM | POA: Diagnosis present

## 2018-08-18 DIAGNOSIS — S01112A Laceration without foreign body of left eyelid and periocular area, initial encounter: Secondary | ICD-10-CM | POA: Diagnosis present

## 2018-08-18 DIAGNOSIS — S72002A Fracture of unspecified part of neck of left femur, initial encounter for closed fracture: Secondary | ICD-10-CM | POA: Diagnosis present

## 2018-08-18 DIAGNOSIS — Z8659 Personal history of other mental and behavioral disorders: Secondary | ICD-10-CM

## 2018-08-18 DIAGNOSIS — Z23 Encounter for immunization: Secondary | ICD-10-CM | POA: Diagnosis not present

## 2018-08-18 DIAGNOSIS — Z20828 Contact with and (suspected) exposure to other viral communicable diseases: Secondary | ICD-10-CM | POA: Diagnosis present

## 2018-08-18 DIAGNOSIS — E43 Unspecified severe protein-calorie malnutrition: Secondary | ICD-10-CM | POA: Diagnosis present

## 2018-08-18 DIAGNOSIS — Z7982 Long term (current) use of aspirin: Secondary | ICD-10-CM | POA: Diagnosis not present

## 2018-08-18 DIAGNOSIS — S12101D Unspecified nondisplaced fracture of second cervical vertebra, subsequent encounter for fracture with routine healing: Secondary | ICD-10-CM | POA: Diagnosis not present

## 2018-08-18 DIAGNOSIS — Z590 Homelessness: Secondary | ICD-10-CM | POA: Diagnosis not present

## 2018-08-18 DIAGNOSIS — S82401A Unspecified fracture of shaft of right fibula, initial encounter for closed fracture: Secondary | ICD-10-CM | POA: Diagnosis present

## 2018-08-18 DIAGNOSIS — M25061 Hemarthrosis, right knee: Secondary | ICD-10-CM | POA: Diagnosis present

## 2018-08-18 DIAGNOSIS — N400 Enlarged prostate without lower urinary tract symptoms: Secondary | ICD-10-CM | POA: Diagnosis present

## 2018-08-18 DIAGNOSIS — R410 Disorientation, unspecified: Secondary | ICD-10-CM

## 2018-08-18 DIAGNOSIS — F209 Schizophrenia, unspecified: Secondary | ICD-10-CM | POA: Diagnosis present

## 2018-08-18 DIAGNOSIS — F17210 Nicotine dependence, cigarettes, uncomplicated: Secondary | ICD-10-CM | POA: Diagnosis present

## 2018-08-18 DIAGNOSIS — X810XXA Intentional self-harm by jumping or lying in front of motor vehicle, initial encounter: Secondary | ICD-10-CM | POA: Diagnosis not present

## 2018-08-18 DIAGNOSIS — Z6821 Body mass index (BMI) 21.0-21.9, adult: Secondary | ICD-10-CM | POA: Diagnosis not present

## 2018-08-18 DIAGNOSIS — S82144A Nondisplaced bicondylar fracture of right tibia, initial encounter for closed fracture: Secondary | ICD-10-CM | POA: Diagnosis present

## 2018-08-18 HISTORY — PX: HIP PINNING,CANNULATED: SHX1758

## 2018-08-18 HISTORY — DX: Fracture of unspecified part of neck of left femur, initial encounter for closed fracture: S72.002A

## 2018-08-18 LAB — BASIC METABOLIC PANEL
Anion gap: 8 (ref 5–15)
BUN: 17 mg/dL (ref 6–20)
CO2: 25 mmol/L (ref 22–32)
Calcium: 9.1 mg/dL (ref 8.9–10.3)
Chloride: 106 mmol/L (ref 98–111)
Creatinine, Ser: 0.54 mg/dL — ABNORMAL LOW (ref 0.61–1.24)
GFR calc Af Amer: >60 mL/min (ref >60)
GFR calc non Af Amer: >60 mL/min (ref >60)
Glucose, Bld: 107 mg/dL — ABNORMAL HIGH (ref 70–99)
Potassium: 4.4 mmol/L (ref 3.5–5.1)
Sodium: 139 mmol/L (ref 135–145)

## 2018-08-18 LAB — CBC
HCT: 37 % — ABNORMAL LOW (ref 39.0–52.0)
Hemoglobin: 12.1 g/dL — ABNORMAL LOW (ref 13.0–17.0)
MCH: 30.2 pg (ref 26.0–34.0)
MCHC: 32.7 g/dL (ref 30.0–36.0)
MCV: 92.3 fL (ref 80.0–100.0)
Platelets: 211 10*3/uL (ref 150–400)
RBC: 4.01 MIL/uL — ABNORMAL LOW (ref 4.22–5.81)
RDW: 12.3 % (ref 11.5–15.5)
WBC: 10.7 10*3/uL — ABNORMAL HIGH (ref 4.0–10.5)
nRBC: 0 % (ref 0.0–0.2)

## 2018-08-18 LAB — SARS CORONAVIRUS 2 BY RT PCR (HOSPITAL ORDER, PERFORMED IN ~~LOC~~ HOSPITAL LAB): SARS Coronavirus 2: NEGATIVE

## 2018-08-18 LAB — PROTIME-INR
INR: 1 (ref 0.8–1.2)
Prothrombin Time: 13.1 seconds (ref 11.4–15.2)

## 2018-08-18 LAB — SURGICAL PCR SCREEN
MRSA, PCR: NEGATIVE
Staphylococcus aureus: POSITIVE — AB

## 2018-08-18 SURGERY — FIXATION, FEMUR, NECK, PERCUTANEOUS, USING SCREW
Anesthesia: General | Site: Hip | Laterality: Left

## 2018-08-18 MED ORDER — BACITRACIN ZINC 500 UNIT/GM EX OINT
TOPICAL_OINTMENT | CUTANEOUS | Status: AC
  Filled 2018-08-18: qty 28.35

## 2018-08-18 MED ORDER — ACETAMINOPHEN 650 MG RE SUPP: 650.0000 mg | Freq: Four times a day (QID) | RECTAL | Status: DC | PRN

## 2018-08-18 MED ORDER — GENTAMICIN SULFATE 40 MG/ML IJ SOLN
INTRAMUSCULAR | Status: AC
  Filled 2018-08-18: qty 2

## 2018-08-18 MED ORDER — ONDANSETRON HCL 4 MG PO TABS: 4.0000 mg | ORAL_TABLET | Freq: Four times a day (QID) | ORAL | Status: DC | PRN

## 2018-08-18 MED ORDER — LIDOCAINE HCL (CARDIAC) PF 100 MG/5ML IV SOSY
PREFILLED_SYRINGE | INTRAVENOUS | Status: DC | PRN
  Administered 2018-08-18: 100 mg via INTRAVENOUS

## 2018-08-18 MED ORDER — ONDANSETRON HCL 4 MG/2ML IJ SOLN: 4.0000 mg | Freq: Once | INTRAMUSCULAR | Status: DC | PRN

## 2018-08-18 MED ORDER — CEFAZOLIN SODIUM-DEXTROSE 2-4 GM/100ML-% IV SOLN
2.0000 g | INTRAVENOUS | Status: AC
  Administered 2018-08-18: 2 g via INTRAVENOUS
  Filled 2018-08-18: qty 100

## 2018-08-18 MED ORDER — OXYCODONE HCL 5 MG PO TABS
5.0000 mg | ORAL_TABLET | ORAL | Status: DC | PRN
  Administered 2018-08-19: 10 mg via ORAL
  Administered 2018-08-19: 5 mg via ORAL
  Administered 2018-08-19: 10 mg via ORAL
  Administered 2018-08-20: 07:00:00 5 mg via ORAL
  Administered 2018-08-21 (×2): 10 mg via ORAL
  Administered 2018-08-23: 5 mg via ORAL
  Administered 2018-08-23: 10 mg via ORAL
  Administered 2018-08-24 – 2018-08-30 (×10): 5 mg via ORAL
  Filled 2018-08-18 (×2): qty 1
  Filled 2018-08-18 (×2): qty 2
  Filled 2018-08-18 (×4): qty 1
  Filled 2018-08-18 (×2): qty 2
  Filled 2018-08-18 (×3): qty 1
  Filled 2018-08-18: qty 2
  Filled 2018-08-18: qty 1
  Filled 2018-08-18: qty 2
  Filled 2018-08-18 (×2): qty 1
  Filled 2018-08-18: qty 2

## 2018-08-18 MED ORDER — CEFAZOLIN SODIUM-DEXTROSE 2-4 GM/100ML-% IV SOLN
INTRAVENOUS | Status: AC
  Filled 2018-08-18: qty 100

## 2018-08-18 MED ORDER — PHENOL 1.4 % MT LIQD
1.0000 | OROMUCOSAL | Status: DC | PRN
  Filled 2018-08-18: qty 177

## 2018-08-18 MED ORDER — MIDAZOLAM HCL 5 MG/5ML IJ SOLN
INTRAMUSCULAR | Status: DC | PRN
  Administered 2018-08-18: 2 mg via INTRAVENOUS

## 2018-08-18 MED ORDER — MENTHOL 3 MG MT LOZG
1.0000 | LOZENGE | OROMUCOSAL | Status: DC | PRN
  Filled 2018-08-18: qty 9

## 2018-08-18 MED ORDER — TRAZODONE HCL 100 MG PO TABS
100.0000 mg | ORAL_TABLET | Freq: Every day | ORAL | Status: DC
  Administered 2018-08-18 – 2018-08-30 (×13): 100 mg via ORAL
  Filled 2018-08-18 (×13): qty 1

## 2018-08-18 MED ORDER — METHOCARBAMOL 1000 MG/10ML IJ SOLN
500.0000 mg | Freq: Four times a day (QID) | INTRAVENOUS | Status: DC | PRN
  Filled 2018-08-18: qty 5

## 2018-08-18 MED ORDER — CEFAZOLIN SODIUM-DEXTROSE 2-4 GM/100ML-% IV SOLN
2.0000 g | Freq: Four times a day (QID) | INTRAVENOUS | Status: AC
  Administered 2018-08-18 – 2018-08-19 (×2): 2 g via INTRAVENOUS
  Filled 2018-08-18 (×2): qty 100

## 2018-08-18 MED ORDER — OLANZAPINE 5 MG PO TABS
5.0000 mg | ORAL_TABLET | Freq: Two times a day (BID) | ORAL | Status: DC
  Administered 2018-08-18 – 2018-08-31 (×27): 5 mg via ORAL
  Filled 2018-08-18 (×27): qty 1

## 2018-08-18 MED ORDER — SODIUM CHLORIDE 0.9 % IV SOLN
INTRAVENOUS | Status: DC
  Administered 2018-08-18: 05:00:00 via INTRAVENOUS

## 2018-08-18 MED ORDER — ENOXAPARIN SODIUM 40 MG/0.4ML ~~LOC~~ SOLN
40.0000 mg | SUBCUTANEOUS | Status: DC
  Administered 2018-08-19 – 2018-08-31 (×13): 40 mg via SUBCUTANEOUS
  Filled 2018-08-18 (×13): qty 0.4

## 2018-08-18 MED ORDER — QUETIAPINE FUMARATE 200 MG PO TABS
200.0000 mg | ORAL_TABLET | Freq: Every day | ORAL | Status: DC
  Administered 2018-08-18 – 2018-08-30 (×13): 200 mg via ORAL
  Filled 2018-08-18: qty 1
  Filled 2018-08-18: qty 8
  Filled 2018-08-18 (×3): qty 1
  Filled 2018-08-18: qty 8
  Filled 2018-08-18 (×9): qty 1

## 2018-08-18 MED ORDER — DEXAMETHASONE SODIUM PHOSPHATE 10 MG/ML IJ SOLN
INTRAMUSCULAR | Status: DC | PRN
  Administered 2018-08-18: 10 mg via INTRAVENOUS

## 2018-08-18 MED ORDER — HYDROCODONE-ACETAMINOPHEN 5-325 MG PO TABS: 1.0000 | ORAL_TABLET | Freq: Four times a day (QID) | ORAL | Status: DC | PRN

## 2018-08-18 MED ORDER — MAGNESIUM CITRATE PO SOLN
1.0000 | Freq: Once | ORAL | Status: AC | PRN
  Administered 2018-08-19: 12:00:00 1 via ORAL
  Filled 2018-08-18 (×2): qty 296

## 2018-08-18 MED ORDER — PROPOFOL 10 MG/ML IV BOLUS
INTRAVENOUS | Status: DC | PRN
  Administered 2018-08-18: 30 mg via INTRAVENOUS
  Administered 2018-08-18: 150 mg via INTRAVENOUS

## 2018-08-18 MED ORDER — BISACODYL 10 MG RE SUPP
10.0000 mg | Freq: Every day | RECTAL | Status: DC | PRN
  Filled 2018-08-18: qty 1

## 2018-08-18 MED ORDER — METHOCARBAMOL 500 MG PO TABS
500.0000 mg | ORAL_TABLET | Freq: Four times a day (QID) | ORAL | Status: DC | PRN
  Filled 2018-08-18: qty 1

## 2018-08-18 MED ORDER — DOCUSATE SODIUM 100 MG PO CAPS
100.0000 mg | ORAL_CAPSULE | Freq: Two times a day (BID) | ORAL | Status: DC
  Administered 2018-08-18 – 2018-08-21 (×6): 100 mg via ORAL
  Filled 2018-08-18 (×6): qty 1

## 2018-08-18 MED ORDER — FENTANYL CITRATE (PF) 100 MCG/2ML IJ SOLN
INTRAMUSCULAR | Status: AC
  Filled 2018-08-18: qty 2

## 2018-08-18 MED ORDER — SUGAMMADEX SODIUM 200 MG/2ML IV SOLN
INTRAVENOUS | Status: DC | PRN
  Administered 2018-08-18: 140 mg via INTRAVENOUS

## 2018-08-18 MED ORDER — PHENYLEPHRINE HCL (PRESSORS) 10 MG/ML IV SOLN
INTRAVENOUS | Status: DC | PRN
  Administered 2018-08-18 (×2): 100 ug via INTRAVENOUS

## 2018-08-18 MED ORDER — FENTANYL CITRATE (PF) 100 MCG/2ML IJ SOLN
50.0000 ug | INTRAMUSCULAR | Status: AC | PRN
  Administered 2018-08-18 (×6): 50 ug via INTRAVENOUS
  Filled 2018-08-18 (×2): qty 2

## 2018-08-18 MED ORDER — FENTANYL CITRATE (PF) 100 MCG/2ML IJ SOLN
INTRAMUSCULAR | Status: AC
  Administered 2018-08-18: 25 ug via INTRAVENOUS
  Filled 2018-08-18: qty 2

## 2018-08-18 MED ORDER — KETOROLAC TROMETHAMINE 15 MG/ML IJ SOLN
7.5000 mg | Freq: Four times a day (QID) | INTRAMUSCULAR | Status: AC
  Administered 2018-08-19 (×3): 7.5 mg via INTRAVENOUS
  Filled 2018-08-18 (×4): qty 1

## 2018-08-18 MED ORDER — ACETAMINOPHEN 500 MG PO TABS
1000.0000 mg | ORAL_TABLET | Freq: Four times a day (QID) | ORAL | Status: AC
  Administered 2018-08-18 – 2018-08-19 (×4): 1000 mg via ORAL
  Filled 2018-08-18 (×4): qty 2

## 2018-08-18 MED ORDER — KETOROLAC TROMETHAMINE 15 MG/ML IJ SOLN
INTRAMUSCULAR | Status: DC | PRN
  Administered 2018-08-18: 30 mg via INTRAVENOUS

## 2018-08-18 MED ORDER — METHOCARBAMOL 500 MG PO TABS: 500.0000 mg | ORAL_TABLET | Freq: Four times a day (QID) | ORAL | Status: DC | PRN

## 2018-08-18 MED ORDER — ALUM & MAG HYDROXIDE-SIMETH 200-200-20 MG/5ML PO SUSP
30.0000 mL | ORAL | Status: DC | PRN
  Administered 2018-08-29 – 2018-08-30 (×3): 30 mL via ORAL
  Filled 2018-08-18 (×3): qty 30

## 2018-08-18 MED ORDER — POTASSIUM CHLORIDE IN NACL 20-0.9 MEQ/L-% IV SOLN
INTRAVENOUS | Status: DC
  Administered 2018-08-18 – 2018-08-19 (×2): via INTRAVENOUS
  Filled 2018-08-18 (×12): qty 1000

## 2018-08-18 MED ORDER — POLYETHYLENE GLYCOL 3350 17 G PO PACK
17.0000 g | PACK | Freq: Every day | ORAL | Status: DC | PRN
  Administered 2018-08-22 – 2018-08-28 (×4): 17 g via ORAL
  Filled 2018-08-18 (×4): qty 1

## 2018-08-18 MED ORDER — SODIUM CHLORIDE 0.9 % IV SOLN
INTRAVENOUS | Status: DC | PRN
  Administered 2018-08-18: 50 ug/min via INTRAVENOUS

## 2018-08-18 MED ORDER — ACETAMINOPHEN 10 MG/ML IV SOLN
INTRAVENOUS | Status: AC
  Filled 2018-08-18: qty 100

## 2018-08-18 MED ORDER — ROCURONIUM BROMIDE 100 MG/10ML IV SOLN
INTRAVENOUS | Status: DC | PRN
  Administered 2018-08-18: 50 mg via INTRAVENOUS

## 2018-08-18 MED ORDER — MORPHINE SULFATE (PF) 2 MG/ML IV SOLN
0.5000 mg | INTRAVENOUS | Status: DC | PRN
  Administered 2018-08-18: 0.5 mg via INTRAVENOUS
  Filled 2018-08-18: qty 1

## 2018-08-18 MED ORDER — ACETAMINOPHEN 325 MG PO TABS: 650.0000 mg | ORAL_TABLET | Freq: Four times a day (QID) | ORAL | Status: DC | PRN

## 2018-08-18 MED ORDER — MIDAZOLAM HCL 2 MG/2ML IJ SOLN
INTRAMUSCULAR | Status: AC
  Filled 2018-08-18: qty 2

## 2018-08-18 MED ORDER — LACTATED RINGERS IV SOLN
INTRAVENOUS | Status: DC | PRN
  Administered 2018-08-18: 16:00:00 via INTRAVENOUS

## 2018-08-18 MED ORDER — SUCCINYLCHOLINE CHLORIDE 20 MG/ML IJ SOLN
INTRAMUSCULAR | Status: DC | PRN
  Administered 2018-08-18: 100 mg via INTRAVENOUS

## 2018-08-18 MED ORDER — HYDROMORPHONE HCL 1 MG/ML IJ SOLN: 0.5000 mg | INTRAMUSCULAR | Status: DC | PRN

## 2018-08-18 MED ORDER — FENTANYL CITRATE (PF) 100 MCG/2ML IJ SOLN
25.0000 ug | INTRAMUSCULAR | Status: DC | PRN
  Administered 2018-08-18 (×3): 25 ug via INTRAVENOUS

## 2018-08-18 MED ORDER — ACETAMINOPHEN 325 MG PO TABS
325.0000 mg | ORAL_TABLET | Freq: Four times a day (QID) | ORAL | Status: DC | PRN
  Administered 2018-08-29: 650 mg via ORAL
  Filled 2018-08-18: qty 2

## 2018-08-18 MED ORDER — DOCUSATE SODIUM 100 MG PO CAPS: 100.0000 mg | ORAL_CAPSULE | Freq: Two times a day (BID) | ORAL | Status: DC

## 2018-08-18 MED ORDER — GABAPENTIN 300 MG PO CAPS
300.0000 mg | ORAL_CAPSULE | Freq: Three times a day (TID) | ORAL | Status: DC
  Administered 2018-08-18 – 2018-08-31 (×39): 300 mg via ORAL
  Filled 2018-08-18 (×39): qty 1

## 2018-08-18 MED ORDER — ONDANSETRON HCL 4 MG/2ML IJ SOLN
4.0000 mg | Freq: Four times a day (QID) | INTRAMUSCULAR | Status: DC | PRN
  Administered 2018-08-18: 4 mg via INTRAVENOUS

## 2018-08-18 MED ORDER — ONDANSETRON HCL 4 MG/2ML IJ SOLN: 4.0000 mg | Freq: Four times a day (QID) | INTRAMUSCULAR | Status: DC | PRN

## 2018-08-18 MED ORDER — OXYCODONE HCL 5 MG PO TABS
10.0000 mg | ORAL_TABLET | ORAL | Status: DC | PRN
  Administered 2018-08-18: 10 mg via ORAL
  Administered 2018-08-24: 5 mg via ORAL
  Administered 2018-08-28 – 2018-08-31 (×3): 10 mg via ORAL
  Filled 2018-08-18 (×5): qty 2

## 2018-08-18 SURGICAL SUPPLY — 40 items
BANDAGE ACE 6X5 VEL STRL LF (GAUZE/BANDAGES/DRESSINGS) ×2 IMPLANT
BLADE SURG SZ10 CARB STEEL (BLADE) ×3 IMPLANT
BNDG COHESIVE 6X5 TAN STRL LF (GAUZE/BANDAGES/DRESSINGS) ×6 IMPLANT
BRACE KNEE POST OP SHORT (BRACE) ×2 IMPLANT
CANISTER SUCT 1200ML W/VALVE (MISCELLANEOUS) ×3 IMPLANT
CLOSURE WOUND 1/2 X4 (GAUZE/BANDAGES/DRESSINGS) ×1
COVER WAND RF STERILE (DRAPES) ×3 IMPLANT
DRAPE SURG 17X11 SM STRL (DRAPES) ×6 IMPLANT
DRAPE U-SHAPE 47X51 STRL (DRAPES) ×3 IMPLANT
DRESSING ALLEVYN 4X4 (MISCELLANEOUS) ×2 IMPLANT
DRSG OPSITE POSTOP 3X4 (GAUZE/BANDAGES/DRESSINGS) ×4 IMPLANT
DRSG OPSITE POSTOP 4X6 (GAUZE/BANDAGES/DRESSINGS) IMPLANT
DURAPREP 26ML APPLICATOR (WOUND CARE) ×6 IMPLANT
ELECT REM PT RETURN 9FT ADLT (ELECTROSURGICAL) ×3
ELECTRODE REM PT RTRN 9FT ADLT (ELECTROSURGICAL) ×1 IMPLANT
GAUZE SPONGE 4X4 12PLY STRL (GAUZE/BANDAGES/DRESSINGS) ×7 IMPLANT
GLOVE BIOGEL PI IND STRL 9 (GLOVE) ×1 IMPLANT
GLOVE BIOGEL PI INDICATOR 9 (GLOVE) ×2
GLOVE SURG 9.0 ORTHO LTXF (GLOVE) ×6 IMPLANT
GOWN STRL REUS TWL 2XL XL LVL4 (GOWN DISPOSABLE) ×3 IMPLANT
GOWN STRL REUS W/ TWL LRG LVL3 (GOWN DISPOSABLE) ×1 IMPLANT
GOWN STRL REUS W/TWL LRG LVL3 (GOWN DISPOSABLE) ×2
GUIDEWIRE THREADED 2.8 (WIRE) ×6 IMPLANT
HOLDER FOLEY CATH W/STRAP (MISCELLANEOUS) IMPLANT
MAT ABSORB  FLUID 56X50 GRAY (MISCELLANEOUS) ×2
MAT ABSORB FLUID 56X50 GRAY (MISCELLANEOUS) ×1 IMPLANT
NDL SAFETY ECLIPSE 18X1.5 (NEEDLE) IMPLANT
NEEDLE HYPO 18GX1.5 SHARP (NEEDLE) ×2
NS IRRIG 1000ML POUR BTL (IV SOLUTION) ×3 IMPLANT
PACK HIP COMPR (MISCELLANEOUS) ×3 IMPLANT
SCREW CANN 32 THRD/100 7.3 (Screw) ×2 IMPLANT
SCREW CANN 32 THRD/90 7.3 (Screw) ×4 IMPLANT
STAPLER SKIN PROX 35W (STAPLE) ×3 IMPLANT
STRAP SAFETY 5IN WIDE (MISCELLANEOUS) ×3 IMPLANT
STRIP CLOSURE SKIN 1/2X4 (GAUZE/BANDAGES/DRESSINGS) ×1 IMPLANT
SUT VIC AB 0 CT1 36 (SUTURE) ×3 IMPLANT
SUT VIC AB 2-0 CT2 27 (SUTURE) ×3 IMPLANT
SUT VICRYL 0 AB UR-6 (SUTURE) ×6 IMPLANT
SYR 50ML LL SCALE MARK (SYRINGE) ×4 IMPLANT
TRAY FOLEY MTR SLVR 16FR STAT (SET/KITS/TRAYS/PACK) IMPLANT

## 2018-08-18 NOTE — Anesthesia Procedure Notes (Signed)
Procedure Name: Intubation Date/Time: 08/18/2018 3:26 PM Performed by: Nelda Marseille, CRNA Pre-anesthesia Checklist: Patient identified, Patient being monitored, Timeout performed, Emergency Drugs available and Suction available Patient Re-evaluated:Patient Re-evaluated prior to induction Oxygen Delivery Method: Circle system utilized Preoxygenation: Pre-oxygenation with 100% oxygen Induction Type: IV induction Ventilation: Mask ventilation without difficulty Laryngoscope Size: Mac, 3 and McGraph Grade View: Grade I Tube type: Oral Tube size: 7.5 mm Number of attempts: 1 Airway Equipment and Method: Stylet Placement Confirmation: ETT inserted through vocal cords under direct vision,  positive ETCO2 and breath sounds checked- equal and bilateral Secured at: 21 cm Tube secured with: Tape Dental Injury: Teeth and Oropharynx as per pre-operative assessment
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
## 2018-08-18 NOTE — Progress Notes (Signed)
The patient has been deemed incompetent to make his own medical decisions given his current confusion.  Emergency consent protocol of [facility] was then used by contacting the patient's designated emergency contact, Kennieth Rad.  Patient has no known living family members.  Ms. Hardin Negus is listed as a friend.  I explained to Ms. Hardin Negus the plan for surgery and the postoperative course.  I discussed the risks and benefits of surgery with her.  She understands he also has a right proximal tibial fracture which will be treated nonoperatively.  Ms. Hardin Negus gave concerned to proceed with surgical fixation of the patient's left nondisplaced femoral neck hip fracture.  Our conversation was witnessed by a nurse and verbally confirmed by that nurse with Ms. Hardin Negus.  Dr. Andree Elk from anesthesia also spoke with Ms. Hardin Negus to obtain anesthesia consent.  I feel this surgical procedure is medically indicated given that the patient is 58 years old and ambulatory at baseline.  Patient has been medically cleared for surgery by our hospitalist service.
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
## 2018-08-18 NOTE — Anesthesia Post-op Follow-up Note (Signed)
Anesthesia QCDR form completed.        

## 2018-08-18 NOTE — Transfer of Care (Signed)
Immediate Anesthesia Transfer of Care Note  Patient: Richard Vasquez  Procedure(s) Performed: CANNULATED HIP PINNING, Right knee aspiration (Left Hip)  Patient Location: PACU  Anesthesia Type:General  Level of Consciousness: sedated  Airway & Oxygen Therapy: Patient connected to face mask oxygen  Post-op Assessment: Report given to RN and Post -op Vital signs reviewed and stable  Post vital signs: Reviewed and stable  Last Vitals:  Vitals Value Taken Time  BP 103/62 08/18/18 1631  Temp 37.2 C 08/18/18 1626  Pulse 46 08/18/18 1633  Resp 16 08/18/18 1633  SpO2 100 % 08/18/18 1633  Vitals shown include unvalidated device data.  Last Pain:  Vitals:   08/18/18 1410  TempSrc:   PainSc: 5       Patients Stated Pain Goal: 10 (34/96/11 [id number])  Complications: No apparent anesthesia complications

## 2018-08-18 NOTE — ED Notes (Signed)
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 positional or due to muscle spasm or secondary to degenerative changes. Skull base and vertebrae: Nondisplaced right C2 articular pillar fracture as seen on the prior CT with some healing. No new or acute fracture identified. Soft tissues and spinal canal: No prevertebral fluid or swelling. No visible canal hematoma. Disc levels: Multilevel degenerative changes. Endplate irregularity and disc space narrowing most prominent at C5-C6 and C6-C7. Upper chest: There is a 7 mm right upper lobe nodule. Follow-up as recommended on the CT of 06/09/2018. Other: Bilateral carotid bulb calcified plaques. IMPRESSION: 1. No acute intracranial hemorrhage. 2. No acute/traumatic cervical spine pathology. Nondisplaced fracture of the right C2 articular pillar as seen previously with some healing. 3. No acute facial bone fractures. Electronically Signed   By: Anner Crete M.D.   On: 08/17/2018 23:02    Pending Labs Unresulted Labs  (From admission, onward)   None      Vitals/Pain Today's Vitals   08/18/18 0100 08/18/18 0115 08/18/18 0120 08/18/18 0130  BP: (!) 152/99   (!) 147/97  Pulse: 70 77  77  Resp: 18 16  18   Temp:      TempSrc:      SpO2: 100% 100%  99%  PainSc:   6      Isolation Precautions No active isolations  Medications Medications  fentaNYL (SUBLIMAZE) injection 50 mcg (50 mcg Intravenous Given 08/18/18 0152)  fentaNYL (SUBLIMAZE) injection 50 mcg (50 mcg Intravenous Given 08/17/18 2046)  Tdap (BOOSTRIX) injection 0.5 mL (0.5 mLs Intramuscular Given 08/17/18 2047)  iohexol (OMNIPAQUE) 300 MG/ML solution 100 mL (100 mLs Intravenous Contrast Given 08/17/18 2212)  fentaNYL (SUBLIMAZE) injection 50 mcg (50 mcg Intravenous Given 08/17/18 2245)    Mobility walks High fall risk   Focused Assessments Multiple fractures   R Recommendations: See Admitting Provider Note  Report given to:   Additional Notes: Multiple fractures

## 2018-08-18 NOTE — ED Notes (Signed)
Patient admitting to the writer he stepped in front of car purposely. When asked if he was having SI at this time patient would not answer.

## 2018-08-18 NOTE — H&P (Signed)
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 acute intracranial hemorrhage. 2. No acute/traumatic cervical spine pathology. Nondisplaced fracture of the right C2 articular pillar as seen previously with some healing. 3. No acute facial bone fractures. Electronically Signed   By: Anner Crete M.D.   On: 08/17/2018 23:02      IMPRESSION AND PLAN:   1.  Left hip fracture - Dr. Mack Guise consulted - Pending possible surgical intervention - Pain is being managed with analgesic  2.  Right tibia fibula fracture -Dr. Mack Guise consulted for further evaluation and recommendations - We are treating pain with analgesic  3.  Homelessness - Patient is IVC by the police department - Social service consulted for recommendations and assistance  4.  Schizophrenia -Zyprexa continued -Seroquel continued  DVT prophylaxis with SCDs and PPI prophylaxis initiated    All the records are reviewed and case discussed with ED provider. The plan of care was discussed in details with the patient (and family). I answered all questions. The patient agreed to proceed with the above mentioned plan. Further management will depend upon hospital  course.   CODE STATUS: Full code  TOTAL TIME TAKING CARE OF THIS PATIENT: 45 minutes.    Theo Dills SealsCRNPon 08/18/2018 at [place] AM  Pager - [phone]  After 6pm go to [email or web address] - Proofreader  [facility]  Office  [phone]  CC: Primary care physician; Patient, No Pcp Per   Note: This dictation was prepared with Dragon dictation along with smaller phrase technology. Any transcriptional errors that result from this process are unintentional.

## 2018-08-18 NOTE — Anesthesia Preprocedure Evaluation (Signed)
Anesthesia Evaluation  Patient identified by MRN, date of birth, ID band Patient awake    Reviewed: Allergy & Precautions, H&P , NPO status , Patient's Chart, lab work & pertinent test results, reviewed documented beta blocker date and time   Airway Mallampati: II  TM Distance: >3 FB Neck ROM: full    Dental  (+) Teeth Intact   Pulmonary neg pulmonary ROS, Current Smoker,    Pulmonary exam normal        Cardiovascular negative cardio ROS Normal cardiovascular exam Rhythm:regular Rate:Normal     Neuro/Psych PSYCHIATRIC DISORDERS Schizophrenia negative neurological ROS     GI/Hepatic negative GI ROS, Neg liver ROS,   Endo/Other  negative endocrine ROS  Renal/GU negative Renal ROS  negative genitourinary   Musculoskeletal   Abdominal   Peds  Hematology negative hematology ROS (+)   Anesthesia Other Findings Past Medical History: No date: BPH (benign prostatic hyperplasia) No date: Chronic pain 06/09/2018: Closed fracture of distal end of right femur [facility]) Past Surgical History: 06/10/2018: ORIF FEMUR FRACTURE; Right     Comment:  Procedure: OPEN REDUCTION INTERNAL FIXATION (ORIF)               DISTAL FEMUR FRACTURE;  Surgeon: Shona Needles, MD;                Location: [facility];  Service: Orthopedics;  Laterality:               Right;   Reproductive/Obstetrics negative OB ROS                             Anesthesia Physical Anesthesia Plan  ASA: II and emergent  Anesthesia Plan: General ETT   Post-op Pain Management:    Induction:   PONV Risk Score and Plan:   Airway Management Planned:   Additional Equipment:   Intra-op Plan:   Post-operative Plan:   Informed Consent: I have reviewed the patients History and Physical, chart, labs and discussed the procedure including the risks, benefits and alternatives for the proposed anesthesia with the patient or authorized  representative who has indicated his/her understanding and acceptance.     Dental Advisory Given  Plan Discussed with: CRNA  Anesthesia Plan Comments:         Anesthesia Quick Evaluation

## 2018-08-18 NOTE — Progress Notes (Signed)
Foley insertion attempted, this am per order. Catheter inserted all the way in with no urine flow.
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
## 2018-08-18 NOTE — Op Note (Signed)
08/18/2018  4:48 PM  PATIENT:  [facility]    PRE-OPERATIVE DIAGNOSIS:  Left Hip Fracture  POST-OPERATIVE DIAGNOSIS:  Same  PROCEDURE:  CANNULATED SCREW FIXATION FOR LEFT FEMORAL NECK HIP FRACTURE, Right knee aspiration  SURGEON:  Thornton Park, MD  ANESTHESIA:   Spinal  PREOPERATIVE INDICATIONS:  [facility] is a  58 y.o. male who fell and was found to have a diagnosis of Left Hip Fracture .  Percutaneous fixation has been recommended for fixation of this non-displaced fracture.      The risks benefits and alternatives were discussed with the patient preoperatively including but not limited to infection, bleeding, nerve or blood vessel injury, persistent hip pain,  malunion, nonunion, avascular necrosis, change in lower extremity rotation, leg length discrepancy, failure of the hardware and the need for revision surgery, including the potential for conversion to hemi or total hip arthroplasty. Medical risks include but are not limited to: DVT and pulmonary embolism, myocardial infarction, stroke, pneumonia, respiratory failure and death.  The patient understood and agreed with the plan for surgery.  Patient had the left hip marked with the word yes and my initials according the hospitals correct site of surgery protocol.  OPERATIVE IMPLANTS: 7.3 mm cannulated screws x 3  OPERATIVE FINDINGS: Left nondisplaced femoral neck hip fracture, right large knee hemarthrosis`  OPERATIVE PROCEDURE: The patient was brought to the operating room and general anesthesia was administered by the anesthesia service.  The patient was placed supine on the fracture table. IV Kefzol was administered. The left lower extremity was positioned in a leg holder, without any significant reduction maneuver other than mild internal rotation.  The right leg was placed in hemi-lithotomy position. The hip was prepped and draped in usual sterile fashion.  A time out was performed to verify the patient's name, date of  birth, medical record number, correct site of surgery and correct surger to be performed. The  timeout was also used to verify the patient had received antibiotics that all appropriate instruments, implants and radiographic studies were available in the room. Once all in attendance were in agreement case began..  Once the reduction was near-anatomic, a small lateral incision was made in line with the femur, distal to the greater trochanter, and 3 threaded guidewires were introduced Into the lateral cortex of the femur, across the fracture site and into the femoral head in an inverted triangle configuration. The lengths of these guidepins were measured with a depth gauge. The lateral cortex was then opened with a cannulated drill, and then the 7.3 mm cannulated screws were advanced into position and tightened by hand. Solid fixation was achieved.  The guide pins were then removed and final C-arm images were taken of the fracture fixation. The fracture was well reduced and the hardware in good position.  The wound was irrigated copiously, and the deep and subcutaneous tissues were repaired with 0 and 2-0 Vicryl suture respectively and the skin was approximated with staples.  A dry sterile dressing was applied.    Patient had been complaining of right knee pain.  He has a large hemarthrosis secondary to a nondisplaced tibial plateau fracture in that knee.  The decision was made to aspirate the right knee and reduce the swelling and pressure causing his pain.  Patient had his lateral right knee prepped with Betadine.  Under sterile conditions patient had 50 cc of blood aspirated from his right knee.  Attention the right knee was much improved following this aspiration.  Patient had a dry sterile dressing applied.  An Ace wrap was applied to his right knee to provide compression.  Patient was placed in a hinged knee brace locked in extension as treatment for his nondisplaced tibial plateau fracture.  I was  scrubbed and present the entire case and all sharp and instrument counts were correct at the conclusion of the case.  I spoke with the patient's friend, Kennieth Rad, by phone from the PACU to let her know the case was completed without complication and the patient was stable in the recovery room.    Timoteo Gaul, MD

## 2018-08-19 ENCOUNTER — Encounter: Payer: Self-pay | Admitting: Orthopedic Surgery

## 2018-08-19 LAB — BASIC METABOLIC PANEL
Anion gap: 6 (ref 5–15)
BUN: 15 mg/dL (ref 6–20)
CO2: 24 mmol/L (ref 22–32)
Calcium: 8.4 mg/dL — ABNORMAL LOW (ref 8.9–10.3)
Chloride: 108 mmol/L (ref 98–111)
Creatinine, Ser: 0.6 mg/dL — ABNORMAL LOW (ref 0.61–1.24)
GFR calc Af Amer: >60 mL/min (ref >60)
GFR calc non Af Amer: >60 mL/min (ref >60)
Glucose, Bld: 208 mg/dL — ABNORMAL HIGH (ref 70–99)
Potassium: 4.2 mmol/L (ref 3.5–5.1)
Sodium: 138 mmol/L (ref 135–145)

## 2018-08-19 LAB — CBC
HCT: 30.9 % — ABNORMAL LOW (ref 39.0–52.0)
Hemoglobin: 10.2 g/dL — ABNORMAL LOW (ref 13.0–17.0)
MCH: 30 pg (ref 26.0–34.0)
MCHC: 33 g/dL (ref 30.0–36.0)
MCV: 90.9 fL (ref 80.0–100.0)
Platelets: 156 10*3/uL (ref 150–400)
RBC: 3.4 MIL/uL — ABNORMAL LOW (ref 4.22–5.81)
RDW: 12.3 % (ref 11.5–15.5)
WBC: 11.7 10*3/uL — ABNORMAL HIGH (ref 4.0–10.5)
nRBC: 0 % (ref 0.0–0.2)

## 2018-08-19 MED ORDER — ADULT MULTIVITAMIN W/MINERALS CH
1.0000 | ORAL_TABLET | Freq: Every day | ORAL | Status: DC
  Administered 2018-08-21 – 2018-08-31 (×11): 1 via ORAL
  Filled 2018-08-19 (×12): qty 1

## 2018-08-19 MED ORDER — ENSURE ENLIVE PO LIQD
237.0000 mL | Freq: Two times a day (BID) | ORAL | Status: DC
  Administered 2018-08-19 – 2018-08-31 (×19): 237 mL via ORAL

## 2018-08-19 MED ORDER — MUPIROCIN 2 % EX OINT
1.0000 "application " | TOPICAL_OINTMENT | Freq: Two times a day (BID) | CUTANEOUS | Status: DC
  Administered 2018-08-19 – 2018-08-23 (×8): 1 via NASAL
  Filled 2018-08-19: qty 22

## 2018-08-19 MED ORDER — CHLORHEXIDINE GLUCONATE CLOTH 2 % EX PADS
6.0000 | MEDICATED_PAD | Freq: Every day | CUTANEOUS | Status: AC
  Administered 2018-08-19 – 2018-08-23 (×3): 6 via TOPICAL

## 2018-08-19 NOTE — Progress Notes (Signed)
PT Cancellation Note  Patient Details Name: Richard Vasquez MRN: [id number] DOB: 1961-01-06   Cancelled Treatment:    Reason Eval/Treat Not Completed: (Cervical collar recommended and now present in room; however, patient now completing PASSAR assessment with CSW.  Will continue efforts this PM as appropriate.)   Doni Widmer H. Owens Shark, PT, DPT, NCS 08/19/18, 11:47 AM [phone]
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
## 2018-08-19 NOTE — Progress Notes (Signed)
OT Cancellation Note  Patient Details Name: Richard Vasquez MRN: [id number] DOB: 1960/01/31   Cancelled Treatment:    Reason Eval/Treat Not Completed: Patient at procedure or test/ unavailable Pt with PT when OT attempts evaluation, will f/u as time permits.  Gerrianne Scale, MS, OTR/L ascom [phone] or [phone]  08/19/18, 2:06 PM

## 2018-08-19 NOTE — TOC Progression Note (Signed)
Transition of Care [facility]) - Progression Note    Patient Details  Name: OUSMAN DISE MRN: [id number] Date of Birth: 01/09/1961  [facility] CM/SW Contact  Cambren Helm, Lenice Llamas Phone Number: [phone]  08/19/2018, 11:57 AM  Clinical Narrative: Barbara level 2 PASARR screener called [place] representative (CSW) and requested for CSW to go into patient's room and put Pamala Hurry on speak phone so she can assess patient. Pamala Hurry spoke to patient and completed assessment. Per Pamala Hurry she will write her report and submit it to the state.     Expected Discharge Plan: Skilled Nursing Facility Barriers to Discharge: Homeless with medical needs, Inadequate or no insurance, Continued Medical Work up, Requiring sitter/restraints  Expected Discharge Plan and Services Expected Discharge Plan: [place] In-house Referral: Clinical Social Work     Living arrangements for the past 2 months: Homeless                                       Social Determinants of Health (SDOH) Interventions    Readmission Risk Interventions No flowsheet data found.
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
## 2018-08-19 NOTE — Anesthesia Postprocedure Evaluation (Signed)
Anesthesia Post Note  Patient: Richard Vasquez  Procedure(s) Performed: CANNULATED HIP PINNING, Right knee aspiration (Left Hip)  Patient location during evaluation: PACU Anesthesia Type: General Level of consciousness: awake and alert Pain management: pain level controlled Vital Signs Assessment: post-procedure vital signs reviewed and stable Respiratory status: spontaneous breathing, nonlabored ventilation and respiratory function stable Cardiovascular status: blood pressure returned to baseline and stable Postop Assessment: no apparent nausea or vomiting Anesthetic complications: no     Last Vitals:  Vitals:   08/18/18 1734 08/18/18 1757  BP:  107/85  Pulse: 72 77  Resp: 14 16  Temp:  (!) 36.4 C  SpO2: 99% 100%    Last Pain:  Vitals:   08/18/18 2213  TempSrc:   PainSc: [place]

## 2018-08-19 NOTE — Progress Notes (Signed)
PT Cancellation Note  Patient Details Name: Richard Vasquez MRN: [id number] DOB: [birth year]   Cancelled Treatment:    Reason Eval/Treat Not Completed: Medical issues which prohibited therapy(Consult received and chart review. In addition to current injuries/surgical repair, patient also noted with previous distal R femoral fracture (s/p ORIF) and C2 fracture (managed conservatively, aspen at all times).  Discussed with primary attending; seeking clarification related to current cervical collar recommendations or precautions (especially given NWB bilat UE status and necessary demand on bilat UEs now).  Will continue to follow and initiate as appropriate.)   Ezekiel Menzer H. Owens Shark, PT, DPT, NCS 08/19/18, 9:55 AM [phone]
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
## 2018-08-20 LAB — BASIC METABOLIC PANEL
Anion gap: 4 — ABNORMAL LOW (ref 5–15)
BUN: 13 mg/dL (ref 6–20)
CO2: 26 mmol/L (ref 22–32)
Calcium: 8.3 mg/dL — ABNORMAL LOW (ref 8.9–10.3)
Chloride: 112 mmol/L — ABNORMAL HIGH (ref 98–111)
Creatinine, Ser: 0.51 mg/dL — ABNORMAL LOW (ref 0.61–1.24)
GFR calc Af Amer: >60 mL/min (ref >60)
GFR calc non Af Amer: >60 mL/min (ref >60)
Glucose, Bld: 91 mg/dL (ref 70–99)
Potassium: 4.5 mmol/L (ref 3.5–5.1)
Sodium: 142 mmol/L (ref 135–145)

## 2018-08-20 LAB — CBC
HCT: 29.2 % — ABNORMAL LOW (ref 39.0–52.0)
Hemoglobin: 9.5 g/dL — ABNORMAL LOW (ref 13.0–17.0)
MCH: 30.2 pg (ref 26.0–34.0)
MCHC: 32.5 g/dL (ref 30.0–36.0)
MCV: 92.7 fL (ref 80.0–100.0)
Platelets: 153 10*3/uL (ref 150–400)
RBC: 3.15 MIL/uL — ABNORMAL LOW (ref 4.22–5.81)
RDW: 12.8 % (ref 11.5–15.5)
WBC: 8.1 10*3/uL (ref 4.0–10.5)
nRBC: 0 % (ref 0.0–0.2)

## 2018-08-20 NOTE — TOC Progression Note (Signed)
Transition of Care [facility]) - Progression Note    Patient Details  Name: [name] MRN: [id number] Date of Birth: 1960-04-17  Transition of Care [facility]) CM/SW Contact  Zaul Hubers, Lenice Llamas Phone Number: [phone]  08/20/2018, 8:40 AM  Clinical Narrative: Clinical Social Worker ([place]) left voicemails for [employer] and [employer] financial counselors. Financial consolers will screen patient to see if he qualifies for medicaid and disability. Patient's PASARR has also been received, [id number] F expires 09/19/2018.       Expected Discharge Plan: Skilled Nursing Facility Barriers to Discharge: Homeless with medical needs, Inadequate or no insurance, Continued Medical Work up, Requiring sitter/restraints  Expected Discharge Plan and Services Expected Discharge Plan: [facility] In-house Referral: Clinical Social Work     Living arrangements for the past 2 months: Homeless                                       Social Determinants of Health (SDOH) Interventions    Readmission Risk Interventions No flowsheet data found.

## 2018-08-20 NOTE — TOC Progression Note (Signed)
Transition of Care [facility]) - Progression Note    Patient Details  Name: Richard Vasquez MRN: [id number] Date of Birth: 1960-04-08  Transition of Care [facility]) CM/SW Contact  Kaija Kovacevic, Lenice Llamas Phone Number: [phone]  08/20/2018, 3:50 PM  Clinical Narrative: Clinical Social Worker (CSW) attempted to contact [place] counselor [phone] for a second time and she did not answer. CSW left her a voicemail. CSW also contacted financial counselor supervisor Federico Flake [phone] and left her a voicemail.     Expected Discharge Plan: Skilled Nursing Facility Barriers to Discharge: Homeless with medical needs, Inadequate or no insurance, Continued Medical Work up, Requiring sitter/restraints  Expected Discharge Plan and Services Expected Discharge Plan: [facility] In-house Referral: Clinical Social Work     Living arrangements for the past 2 months: Homeless                                       Social Determinants of Health (SDOH) Interventions    Readmission Risk Interventions No flowsheet data found.
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
## 2018-08-20 NOTE — Progress Notes (Signed)
  Subjective:  POD #2 s/p percutaneous fixation for left femoral neck hip fracture and brace treatment for right tibial plateau fracture.   Patient reports left hip and right knee pain as improved today.  He rates his pain as mild to moderate.    Objective:   VITALS:   Vitals:   08/19/18 2009 08/20/18 0607 08/20/18 1300 08/20/18 1701  BP: 103/69 109/76 106/64 106/77  Pulse: (!) 54 (!) 56 72 77  Resp: 20 20  18   Temp: (!) 97.5 F (36.4 C) 97.7 F (36.5 C) (!) 97.5 F (36.4 C) 98.1 F (36.7 C)  TempSrc: Oral Oral Oral Oral  SpO2: 100% 100% 95% 96%  Weight:        PHYSICAL EXAM: Left lower extremity Neurovascular intact Sensation intact distally Intact pulses distally Dorsiflexion/Plantar flexion intact Incision: dressing C/D/I No cellulitis present Compartment soft  Right lower extremity: Hinged knee brace is in place.  Dressings from right knee aspiration are clean and dry.  The patient can flex and extend his toes, dorsiflex and plantarflex his ankles bilaterally.  He has intact sensation light touch and palpable pedal pulses.  LABS  Results for orders placed or performed during the hospital encounter of 08/17/18 (from the past 24 hour(s))  CBC     Status: Abnormal   Collection Time: 08/20/18  3:17 AM  Result Value Ref Range   WBC 8.1 4.0 - 10.5 K/uL   RBC 3.15 (L) 4.22 - 5.81 MIL/uL   Hemoglobin 9.5 (L) 13.0 - 17.0 g/dL   HCT 29.2 (L) 39.0 - 52.0 %   MCV 92.7 80.0 - 100.0 fL   MCH 30.2 26.0 - 34.0 pg   MCHC 32.5 30.0 - 36.0 g/dL   RDW 12.8 11.5 - 15.5 %   Platelets 153 150 - 400 K/uL   nRBC 0.0 0.0 - 0.2 %  Basic metabolic panel     Status: Abnormal   Collection Time: 08/20/18  3:17 AM  Result Value Ref Range   Sodium 142 135 - 145 mmol/L   Potassium 4.5 3.5 - 5.1 mmol/L   Chloride 112 (H) 98 - 111 mmol/L   CO2 26 22 - 32 mmol/L   Glucose, Bld 91 70 - 99 mg/dL   BUN 13 6 - 20 mg/dL   Creatinine, Ser 0.51 (L) 0.61 - 1.24 mg/dL   Calcium 8.3 (L) 8.9 - 10.3  mg/dL   GFR calc non Af Amer >60 >60 mL/min   GFR calc Af Amer >60 >60 mL/min   Anion gap 4 (L) 5 - 15    No results found.  Assessment/Plan: 2 Days Post-Op   Active Problems:   Closed left hip fracture, initial encounter [facility])  Patient is making progress postop.  Physical therapy is recommending skilled nursing facility for him upon discharge.  Patient is limited weightbearing on the left lower extremity to help with transfers.  He is nonweightbearing on the right lower extremity.  Patient should wear his hinged knee brace at all times.  Continue Lovenox 40 mg daily for DVT prophylaxis for 4 weeks postop.    Thornton Park , MD 08/20/2018, 5:27 PM
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
## 2018-08-21 DIAGNOSIS — E43 Unspecified severe protein-calorie malnutrition: Secondary | ICD-10-CM | POA: Insufficient documentation

## 2018-08-21 LAB — CBC
HCT: 33.7 % — ABNORMAL LOW (ref 39.0–52.0)
Hemoglobin: 10.9 g/dL — ABNORMAL LOW (ref 13.0–17.0)
MCH: 29.9 pg (ref 26.0–34.0)
MCHC: 32.3 g/dL (ref 30.0–36.0)
MCV: 92.3 fL (ref 80.0–100.0)
Platelets: 176 10*3/uL (ref 150–400)
RBC: 3.65 MIL/uL — ABNORMAL LOW (ref 4.22–5.81)
RDW: 12.8 % (ref 11.5–15.5)
WBC: 8.1 10*3/uL (ref 4.0–10.5)
nRBC: 0 % (ref 0.0–0.2)

## 2018-08-21 MED ORDER — SENNOSIDES-DOCUSATE SODIUM 8.6-50 MG PO TABS
2.0000 | ORAL_TABLET | Freq: Two times a day (BID) | ORAL | Status: DC
  Administered 2018-08-21 – 2018-08-31 (×20): 2 via ORAL
  Filled 2018-08-21 (×21): qty 2

## 2018-08-21 MED ORDER — SODIUM CHLORIDE 0.9 % IV BOLUS
1000.0000 mL | Freq: Once | INTRAVENOUS | Status: AC
  Administered 2018-08-22: 1000 mL via INTRAVENOUS

## 2018-08-21 MED ORDER — BISACODYL 10 MG RE SUPP
10.0000 mg | Freq: Every day | RECTAL | Status: DC | PRN
  Administered 2018-08-23 – 2018-08-29 (×3): 10 mg via RECTAL
  Filled 2018-08-21 (×2): qty 1
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
## 2018-08-21 NOTE — Progress Notes (Signed)
[place] [facility] [place] NAME: Richard Vasquez    MR#:  [id number]  DATE OF BIRTH:  1960-06-18  SUBJECTIVE:   Patient states he is still having some left hip pain and right lower extremity pain.  He states that the pain medicines are helping.  He has no other concerns today.  He is looking forward to being able to leave the hospital.  Per RN, he is intermittently refusing medicines.  He is also refusing to eat or drink Ensure, because he is "fasting".  REVIEW OF SYSTEMS:   Review of Systems  Constitutional: Positive for diaphoresis. Negative for chills and fever.  HENT: Negative for congestion and sore throat.   Eyes: Negative for blurred vision and double vision.  Respiratory: Negative for cough and shortness of breath.   Cardiovascular: Negative for chest pain and palpitations.  Gastrointestinal: Negative for nausea and vomiting.  Musculoskeletal: Positive for back pain and joint pain.  Neurological: Negative for dizziness and headaches.  Psychiatric/Behavioral: Negative for depression. The patient is not nervous/anxious.     DRUG ALLERGIES:  No Known Allergies  VITALS:  Blood pressure 118/83, pulse 72, temperature 97.7 F (36.5 C), temperature source Oral, resp. rate 16, weight 66.7 kg, SpO2 98 %.  PHYSICAL EXAMINATION:  GENERAL:  58 y.o.-year-old patient lying in the bed with no acute distress.  EYES: Pupils equal, round, reactive to light and accommodation. No scleral icterus. Extraocular muscles intact.  HEENT: Head atraumatic, normocephalic. Oropharynx and nasopharynx clear.  NECK:  Supple, no jugular venous distention. No thyroid enlargement, no tenderness.  LUNGS: Normal breath sounds bilaterally, no wheezing, rales,rhonchi or crepitation. No use of accessory muscles of respiration.  CARDIOVASCULAR: RRR, S1, S2 normal. No murmurs, rubs, or gallops.  ABDOMEN: Soft, nontender, nondistended. Bowel sounds present. No organomegaly or mass.   EXTREMITIES: No pedal edema, cyanosis, or clubbing. +Right knee immobilizer in place. NEUROLOGIC: Cranial nerves II through XII are intact. +global weakness. Sensation intact. Gait not checked.  PSYCHIATRIC: The patient is alert and oriented x 2 SKIN: No obvious rash, lesion, or ulcer.  LABORATORY PANEL:   CBC Recent Labs  Lab 08/21/18 0305  WBC 8.1  HGB 10.9*  HCT 33.7*  PLT 176   ------------------------------------------------------------------------------------------------------------------  Chemistries  Recent Labs  Lab 08/17/18 2025  08/20/18 0317  NA 139   < > 142  K 3.7   < > 4.5  CL 107   < > 112*  CO2 22   < > 26  GLUCOSE 94   < > 91  BUN 23*   < > 13  CREATININE 0.72   < > 0.51*  CALCIUM 9.1   < > 8.3*  AST 25  --   --   ALT 18  --   --   ALKPHOS 64  --   --   BILITOT 0.8  --   --    < > = values in this interval not displayed.   ------------------------------------------------------------------------------------------------------------------  Cardiac Enzymes No results for input(s): TROPONINI in the last 168 hours. ------------------------------------------------------------------------------------------------------------------  RADIOLOGY:  No results found.  ASSESSMENT AND PLAN:   Left hip fracture  -s/p repair 08/18/18 by Dr. Mack Guise -Lovenox 40 mg subcutaneous daily x 4 weeks -Minimal weightbearing to left lower extremity -Pt recommending SNF- awaiting placement -Will need to follow-up with Ortho in 2 weeks  Right nondisplaced tibial plateau fracture -Ortho recommended nonoperative management -Knee immobilizer to right lower extremity -NWB to RLE  C2 vertebral fracture-  occurred May 2020 -Neurosurgery recommended that patient continue to wear cervical collar and follow-up with them as an outpatient  Acute blood loss anemia- due to surgery. Hemoglobin has dropped from 12.1 to 9.5 -Continue to monitor  Homelessness -Patient is IVC'ed  by the police department -Awaiting SNF placement  Schizophrenia- stable -Continued home Zyprexa and Seroquel  Patient is medically stable for discharge but is awaiting SNF placement. He is homeless and does not have insurance, so this will likely take quite a few days.  All the records are reviewed and case discussed with Care Management/Social Workerr. Management plans discussed with the patient, family and they are in agreement.  CODE STATUS: FUll.  TOTAL TIME TAKING CARE OF THIS PATIENT: 35 minutes.   POSSIBLE D/C IN 3-5 DAYS, DEPENDING ON CLINICAL CONDITION.   Berna Spare Mayo M.D on 08/21/2018   Between 7am to 6pm - Pager - [phone]  After 6pm go to [email or web address] - password [email or web address] [facility]  Office  [phone]  CC: Primary care physician; Patient, No Pcp Per  Note: This dictation was prepared with Dragon dictation along with smaller phrase technology. Any transcriptional errors that result from this process are unintentional.

## 2018-08-21 NOTE — Progress Notes (Signed)
Patients BP 84/60, HR 112. Dr. Jannifer Franklin made aware. Order for 1000 ml bolus received.
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
## 2018-08-21 NOTE — Progress Notes (Signed)
  Subjective:  POD #3 s/p percutaneous fixation for left femoral neck hip fracture and brace placement for right nondisplaced tibial plateau fracture.   Patient reports left hip and right knee pain as mild to moderate.  Patient working with occupational therapy.  He is up out of bed to the chair.  Objective:   VITALS:   Vitals:   08/20/18 1701 08/20/18 2104 08/21/18 0621 08/21/18 0800  BP: 106/77 106/75 109/67 118/83  Pulse: 77 71 69 72  Resp: 18 18 20 16   Temp: 98.1 F (36.7 C) 98.2 F (36.8 C) 98.1 F (36.7 C) 97.7 F (36.5 C)  TempSrc: Oral Oral Oral Oral  SpO2: 96% 96% 98% 98%  Weight:        PHYSICAL EXAM: Left lower extremity Neurovascular intact Sensation intact distally Intact pulses distally Dorsiflexion/Plantar flexion intact Incision: dressing C/D/I No cellulitis present Compartment soft   Right lower extremity: Hinged knee brace is in place.  Dressing is clean dry and intact.  Patient is neurovascular intact.  LABS  Results for orders placed or performed during the hospital encounter of 08/17/18 (from the past 24 hour(s))  CBC     Status: Abnormal   Collection Time: 08/21/18  3:05 AM  Result Value Ref Range   WBC 8.1 4.0 - 10.5 K/uL   RBC 3.65 (L) 4.22 - 5.81 MIL/uL   Hemoglobin 10.9 (L) 13.0 - 17.0 g/dL   HCT 33.7 (L) 39.0 - 52.0 %   MCV 92.3 80.0 - 100.0 fL   MCH 29.9 26.0 - 34.0 pg   MCHC 32.3 30.0 - 36.0 g/dL   RDW 12.8 11.5 - 15.5 %   Platelets 176 150 - 400 K/uL   nRBC 0.0 0.0 - 0.2 %    No results found.  Assessment/Plan: 3 Days Post-Op   Active Problems:   Closed left hip fracture, initial encounter ([place])   Protein-calorie malnutrition, severe  Patient will continue protected weightbearing on the left lower extremity with minimal weightbearing for transfers.  Patient is nonweightbearing on the right lower extremity with the brace locked in extension.  He is being recommended for skilled nursing facility.  Patient will follow-up with me  in 2 weeks.  He should continue Lovenox 40 mg subcu daily for DVT prophylaxis x4 weeks or enteric-coated aspirin 325 mg p.o. twice daily as an alternative to Lovenox.    Thornton Park , MD 08/21/2018, 1:35 PM

## 2018-08-21 NOTE — Progress Notes (Signed)
   08/21/18 2100  Clinical Encounter Type  Visited With Patient  Visit Type Initial;Spiritual support;Social support  Referral From Nurse  Consult/Referral To Chaplain  Recommendations  (Follow-up with patient to provide spiritual/social support)  Spiritual Encounters  Spiritual Needs Prayer;Emotional  Stress Factors  Patient Stress Factors Financial concerns;Health changes;Other (Comment) (Patient experiencing homelessness, food insecurity)   Chaplain received a page from the unit secretary to provide support to the patient. Upon arrival, a safety sitter was present; she left the room to allow the patient some privacy during the visit. The patient was sitting up in bed. He was pleasant and welcoming and confirmed his call to talk with the chaplain. The patient expressed his concern about his mental health and shared that he wants/needs some support with getting things "sorted out." The chaplain affirmed the patient's courage in sharing this vulnerable state. The patient briefly shared what brought him to the hospital as well as some of his concerns for basic needs in life (I.e. Shelter, food, employment, mental health support). Chaplain provided compassionate presence, active and reflective listening, and encouragement. The patient asked for prayer and shared that he is a person of faith who "believes in the power of prayer." Chaplain gladly agreed to pray for/with the patient. Patient expressed gratitude for the visit, conversation, and prayer. The patient expressed thanksgiving to God and feeling "lighter" after our conversation.  The patient plans to call a friend to bring his eyeglasses tomorrow. In the meantime, chaplain provided a large print, soft-bound copy of the New Testament and Psalms for his reading/studying.  Follow-up Care: Check in with the patient to continue providing social and spiritual support. Offer space for patient to share thoughts/feelings/concerns that are heavy and may  be troubling him in light of his mental health.

## 2018-08-22 LAB — CBC
HCT: 31 % — ABNORMAL LOW (ref 39.0–52.0)
Hemoglobin: 10.2 g/dL — ABNORMAL LOW (ref 13.0–17.0)
MCH: 30.1 pg (ref 26.0–34.0)
MCHC: 32.9 g/dL (ref 30.0–36.0)
MCV: 91.4 fL (ref 80.0–100.0)
Platelets: 178 10*3/uL (ref 150–400)
RBC: 3.39 MIL/uL — ABNORMAL LOW (ref 4.22–5.81)
RDW: 12.7 % (ref 11.5–15.5)
WBC: 10.1 10*3/uL (ref 4.0–10.5)
nRBC: 0 % (ref 0.0–0.2)
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
## 2018-08-22 NOTE — Progress Notes (Signed)
  Subjective:  Patient reports pain as mild.  POD #4 s/p percutaneous fixation for left femoral neck hip fracture and brace placement for right nondisplaced tibial plateau fracture.   Patient reports left hip and right knee pain as mild to moderate.  Patient working with occupational therapy.  He is up out of bed to the chair.  Objective:   VITALS:   Vitals:   08/21/18 2311 08/22/18 0112 08/22/18 0401 08/22/18 0739  BP: (!) 84/60 92/64 93/68  (!) 88/60  Pulse: (!) 112 92 80 72  Resp: 18 16 16 18   Temp:  98.1 F (36.7 C) 98.1 F (36.7 C) 98 F (36.7 C)  TempSrc:   Oral Oral  SpO2: 91% 95% 95% 93%  Weight:      Height:        PHYSICAL EXAM:  Neurologically intact ABD soft Neurovascular intact Sensation intact distally Intact pulses distally Dorsiflexion/Plantar flexion intact Incision: dressing changed at left hip No cellulitis present Compartment soft  LABS  Results for orders placed or performed during the hospital encounter of 08/17/18 (from the past 24 hour(s))  CBC     Status: Abnormal   Collection Time: 08/22/18  5:23 AM  Result Value Ref Range   WBC 10.1 4.0 - 10.5 K/uL   RBC 3.39 (L) 4.22 - 5.81 MIL/uL   Hemoglobin 10.2 (L) 13.0 - 17.0 g/dL   HCT 31.0 (L) 39.0 - 52.0 %   MCV 91.4 80.0 - 100.0 fL   MCH 30.1 26.0 - 34.0 pg   MCHC 32.9 30.0 - 36.0 g/dL   RDW 12.7 11.5 - 15.5 %   Platelets 178 150 - 400 K/uL   nRBC 0.0 0.0 - 0.2 %    No results found.  Assessment/Plan: 4 Days Post-Op   Active Problems:   Closed left hip fracture, initial encounter ([place])   Protein-calorie malnutrition, severe   Advance diet Up with therapy   Patient will continue protected weightbearing on the left lower extremity with minimal weightbearing for transfers.  Patient is nonweightbearing on the right lower extremity with the brace locked in extension.  He is being recommended for skilled nursing facility.  Patient will follow-up with Dr. Mack Guise in 2 weeks [phone]  [phone].  He should continue Lovenox 40 mg subcu daily for DVT prophylaxis x4 weeks or enteric-coated aspirin 325 mg p.o. twice daily as an alternative to Lovenox.    Carlynn Spry , PA-C 08/22/2018, 10:40 AM

## 2018-08-23 DIAGNOSIS — F209 Schizophrenia, unspecified: Secondary | ICD-10-CM
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
## 2018-08-23 NOTE — Progress Notes (Signed)
  Subjective:  Patient reports pain as mild.    Objective:   VITALS:   Vitals:   08/22/18 1556 08/22/18 1958 08/23/18 0438 08/23/18 0744  BP: 105/76 113/75 95/69 92/66   Pulse: 76 78 78 72  Resp: 18 18 18 18   Temp: 98.7 F (37.1 C) 98.5 F (36.9 C) 98.2 F (36.8 C) 97.8 F (36.6 C)  TempSrc: Oral Oral Oral Oral  SpO2: 93% 94% 93% 94%  Weight:      Height:        PHYSICAL EXAM:  ABD soft Sensation intact distally Dorsiflexion/Plantar flexion intact Incision: dressing C/D/I No cellulitis present Compartment soft  Right knee brace in good position  LABS  No results found for this or any previous visit (from the past 24 hour(s)).  No results found.  Assessment/Plan: 5 Days Post-Op   Active Problems:   Closed left hip fracture, initial encounter ([place])   Protein-calorie malnutrition, severe   Up with therapy  Patient will continue protected weightbearing on the left lower extremity with minimal weightbearing for transfers. Patient is nonweightbearing on the right lower extremity with the brace locked in extension. He is being recommended for skilled nursing facility. Patient will follow-up with Dr. Mack Guise in 2 weeks [phone]. He should continue Lovenox 40 mg subcu daily for DVT prophylaxis x4 weeks or enteric-coated aspirin 325 mg p.o. twice daily as an alternative to Lovenox.  Lovell Sheehan , MD 08/23/2018, 10:00 AM
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
## 2018-08-23 NOTE — Progress Notes (Signed)
[facility] [place] NAME: Richard Vasquez    MR#:  [id number]  DATE OF BIRTH:  1960-10-03  SUBJECTIVE:   Still hurting in the hips, no other complaints, wants to see psychiatrist..    REVIEW OF SYSTEMS:   Review of Systems  Constitutional: Negative for chills, diaphoresis and fever.  HENT: Negative for congestion and sore throat.   Eyes: Negative for blurred vision and double vision.  Respiratory: Negative for cough and shortness of breath.   Cardiovascular: Negative for chest pain and palpitations.  Gastrointestinal: Negative for nausea and vomiting.  Musculoskeletal: Positive for back pain and joint pain.  Neurological: Negative for dizziness and headaches.  Psychiatric/Behavioral: Negative for depression. The patient is not nervous/anxious.   High-resolution  DRUG ALLERGIES:  No Known Allergies  VITALS:  Blood pressure 92/66, pulse 72, temperature 97.8 F (36.6 C), temperature source Oral, resp. rate 18, height 5\' 10"  (1.778 m), weight 66.7 kg, SpO2 94 %.  PHYSICAL EXAMINATION:  GENERAL:  58 y.o.-year-old patient lying in the bed with no acute distress.  EYES: Pupils equal, round, reactive to light and accommodation. No scleral icterus. Extraocular muscles intact.  HEENT: Head atraumatic, normocephalic. Oropharynx and nasopharynx clear.  NECK:  Supple, no jugular venous distention. No thyroid enlargement, no tenderness.  Patient has cervical collar. LUNGS: Normal breath sounds bilaterally, no wheezing, rales,rhonchi or crepitation. No use of accessory muscles of respiration.  CARDIOVASCULAR: RRR, S1, S2 normal. No murmurs, rubs, or gallops.  ABDOMEN: Soft, nontender, nondistended. Bowel sounds present. No organomegaly or mass.  EXTREMITIES: No pedal edema, cyanosis, or clubbing. +Right knee immobilizer in place. NEUROLOGIC: Cranial nerves II through XII are intact. +global weakness. Sensation intact. Gait not checked.  PSYCHIATRIC:  The patient is alert and oriented x 2 SKIN: No obvious rash, lesion, or ulcer.  LABORATORY PANEL:   CBC Recent Labs  Lab 08/22/18 0523  WBC 10.1  HGB 10.2*  HCT 31.0*  PLT 178   ------------------------------------------------------------------------------------------------------------------  Chemistries  Recent Labs  Lab 08/17/18 2025  08/20/18 0317  NA 139   < > 142  K 3.7   < > 4.5  CL 107   < > 112*  CO2 22   < > 26  GLUCOSE 94   < > 91  BUN 23*   < > 13  CREATININE 0.72   < > 0.51*  CALCIUM 9.1   < > 8.3*  AST 25  --   --   ALT 18  --   --   ALKPHOS 64  --   --   BILITOT 0.8  --   --    < > = values in this interval not displayed.   ------------------------------------------------------------------------------------------------------------------  Cardiac Enzymes No results for input(s): TROPONINI in the last 168 hours. ------------------------------------------------------------------------------------------------------------------  RADIOLOGY:  No results found.  ASSESSMENT AND PLAN:   Left hip fracture  -s/p repair 08/18/18 by Dr. Mack Vasquez -Lovenox 40 mg subcutaneous daily x 4 weeks -Minimal weightbearing to left lower extremity -Pt recommending SNF- awaiting placement -Will need to follow-up with Ortho in 2 weeks  Right nondisplaced tibial plateau fracture -Ortho recommended nonoperative management -Knee immobilizer to right lower extremity -NWB to RLE  Slowly healing C2 fracture, seen by neurosurgery, patient is given new Aspen collar, informed to the patient to continue wearing the collar as fracture is not healed completely, follow-up with neurosurgery clinic in 3 to 4 weeks, and was seen by them on July 22.  Patient  is to have follow-up appointment made tomorrow.  Homelessness -Patient is IVC'ed by the police department psychiatric consult placed in view of schizophrenia, IVC.  Patient wants to see psychiatrist.- Schizophrenia-  stable -Continued home Zyprexa and Seroquel  Patient is medically stable for discharge but is awaiting SNF placement. He is homeless and does not have insurance, so this will likely take quite a few days.  All the records are reviewed and case discussed with Care Management/Social Workerr. Management plans discussed with the patient, family and they are in agreement.  CODE STATUS: [place].  TOTAL TIME TAKING CARE OF THIS PATIENT: 35 minutes.   POSSIBLE D/C IN 3-5 DAYS, DEPENDING ON CLINICAL CONDITION.   Richard Vasquez M.D on 08/23/2018   Between 7am to 6pm - Pager - [phone]  After 6pm go to [email or web address] - password [email or web address] [facility]  Office  [phone]  CC: Primary care physician; Patient, No Pcp Per  Note: This dictation was prepared with Dragon dictation along with smaller phrase technology. Any transcriptional errors that result from this process are unintentional.
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
## 2018-08-24 NOTE — Progress Notes (Signed)
[facility] [place] NAME: Richard Vasquez    MR#:  [id number]  DATE OF BIRTH:  [birth year]  SUBJECTIVE:   Pain is well controlled Sitter at bedside  REVIEW OF SYSTEMS:   Review of Systems  Constitutional: Negative for chills, diaphoresis and fever.  HENT: Negative for congestion and sore throat.   Eyes: Negative for blurred vision and double vision.  Respiratory: Negative for cough and shortness of breath.   Cardiovascular: Negative for chest pain and palpitations.  Gastrointestinal: Negative for nausea and vomiting.  Musculoskeletal: Positive for back pain and joint pain.  Neurological: Negative for dizziness and headaches.  Psychiatric/Behavioral: Negative for depression. The patient is not nervous/anxious.   High-resolution  DRUG ALLERGIES:  No Known Allergies  VITALS:  Blood pressure 104/70, pulse 75, temperature 98 F (36.7 C), temperature source Oral, resp. rate 20, height 5\' 10"  (1.778 m), weight 66.7 kg, SpO2 92 %.  PHYSICAL EXAMINATION:  GENERAL:  58 y.o.-year-old patient lying in the bed with no acute distress.  EYES: Pupils equal, round, reactive to light and accommodation. No scleral icterus. Extraocular muscles intact.  HEENT: Head atraumatic, normocephalic. Oropharynx and nasopharynx clear.  NECK:  Supple, no jugular venous distention. No thyroid enlargement, no tenderness.  Patient has cervical collar. LUNGS: Normal breath sounds bilaterally, no wheezing, rales,rhonchi or crepitation. No use of accessory muscles of respiration.  CARDIOVASCULAR: RRR, S1, S2 normal. No murmurs, rubs, or gallops.  ABDOMEN: Soft, nontender, nondistended. Bowel sounds present. No organomegaly or mass.  EXTREMITIES: No pedal edema, cyanosis, or clubbing. +Right knee immobilizer in place. NEUROLOGIC: Cranial nerves II through XII are intact. +global weakness. Sensation intact. Gait not checked.  PSYCHIATRIC: The patient is alert and oriented x  2  SKIN: No obvious rash, lesion, or ulcer.  LABORATORY PANEL:   CBC Recent Labs  Lab 08/22/18 0523  WBC 10.1  HGB 10.2*  HCT 31.0*  PLT 178   ------------------------------------------------------------------------------------------------------------------  Chemistries  Recent Labs  Lab 08/17/18 2025  08/20/18 0317  NA 139   < > 142  K 3.7   < > 4.5  CL 107   < > 112*  CO2 22   < > 26  GLUCOSE 94   < > 91  BUN 23*   < > 13  CREATININE 0.72   < > 0.51*  CALCIUM 9.1   < > 8.3*  AST 25  --   --   ALT 18  --   --   ALKPHOS 64  --   --   BILITOT 0.8  --   --    < > = values in this interval not displayed.   ------------------------------------------------------------------------------------------------------------------  Cardiac Enzymes No results for input(s): TROPONINI in the last 168 hours. ------------------------------------------------------------------------------------------------------------------  RADIOLOGY:  No results found.  ASSESSMENT AND PLAN:   * Left hip fracture  - s/p repair 08/18/18 by Dr. Mack Guise - Lovenox 40 mg subcutaneous daily x 4 weeks - Minimal weightbearing to left lower extremity - Waiting for discharge to skilled nursing facility - Will need to follow-up with Ortho in 2 weeks  * Right nondisplaced tibial plateau fracture -Ortho recommended nonoperative management -Knee immobilizer to right lower extremity -NWB to RLE  * Healing C2 fracture, seen by neurosurgery, patient is given new Aspen collar, informed to the patient to continue wearing the collar as fracture is not healed completely, follow-up with neurosurgery clinic in 3 to 4 weeks, and was seen by them on July  22.  * Schizophrenia -Patient is IVC'ed by the police department psychiatric consult placed in view of schizophrenia, IVC.   -Continued home Zyprexa and Seroquel - Psychiatry following patient  All the records are reviewed and case discussed with Care  Management/Social Worker Management plans discussed with the patient.  CODE STATUS: Full code  TOTAL TIME TAKING CARE OF THIS PATIENT: 25 minutes.   POSSIBLE D/C IN 3-5 DAYS, DEPENDING ON CLINICAL CONDITION.  Neita Carp M.D on 08/24/2018   Between 7am to 6pm - Pager - [phone]  After 6pm go to [email or web address] - password [email or web address] [facility]  Office  [phone]  CC: Primary care physician; Patient, No Pcp Per  Note: This dictation was prepared with Dragon dictation along with smaller phrase technology. Any transcriptional errors that result from this process are unintentional.

## 2018-08-24 NOTE — TOC Progression Note (Signed)
Transition of Care [facility]) - Progression Note    Patient Details  Name: Richard Vasquez MRN: [id number] Date of Birth: 1960-09-01  Transition of [facility]) CM/SW Contact  Timarion Agcaoili, Lenice Llamas Phone Number: [phone]  08/24/2018, 9:38 AM  Clinical Narrative:  Clinical Social Worker (CSW) contacted Management consultant at [facility] this morning. Per Rachael she has screened patient and will submit a medicaid application for patient. Per Rachael she will consult with her supervisor Amy to see if patient will likely be approved for medicaid. Per Rachael she will email CSW the follow up once she speaks with Amy. TOC lead is aware of above. CSW will continue to follow and assist as needed.          Expected Discharge Plan: Skilled Nursing Facility Barriers to Discharge: Homeless with medical needs, Inadequate or no insurance, Continued Medical Work up, Requiring sitter/restraints  Expected Discharge Plan and Services Expected Discharge Plan: [place] In-house Referral: Clinical Social Work     Living arrangements for the past 2 months: Homeless                                       Social Determinants of Health (SDOH) Interventions    Readmission Risk Interventions No flowsheet data found.

## 2018-08-24 NOTE — TOC Progression Note (Signed)
Transition of Care [facility]) - Progression Note    Patient Details  Name: Richard Vasquez MRN: [id number] Date of Birth: [birth year]  Transition of Care [facility]) CM/SW Contact  Zilda No, Lenice Llamas Phone Number: [phone]  08/24/2018, 4:21 PM  Clinical Narrative: Per [place] Counselor she reviewed case with co-worker Amy and they agreed that patient will likely get approved for medicaid. Per Rachael she will send referral to servant center so they can apply for disability for patient. CSW sent SNF referral to [place], [place]. CSW will continue to follow and assist as needed.      Expected Discharge Plan: Skilled Nursing Facility Barriers to Discharge: Homeless with medical needs, Inadequate or no insurance, Continued Medical Work up, Requiring sitter/restraints  Expected Discharge Plan and Services Expected Discharge Plan: [place] In-house Referral: Clinical Social Work     Living arrangements for the past 2 months: Homeless                                       Social Determinants of Health (SDOH) Interventions    Readmission Risk Interventions No flowsheet data found.

## 2018-08-24 NOTE — Progress Notes (Signed)
   08/24/18 1100  Clinical Encounter Type  Visited With Patient  Visit Type Follow-up  Referral From Chaplain  Consult/Referral To Chaplain   Chaplain followed up with the patient from encounter on 7/24. Sitter present. Upon arrival, the patient had just been awakened by my knock on the door. Per sitter, he has been in and out of sleep; patient confirmed. Patient expressed gratitude for the visit on Friday and said that the conversation had been "an inspiration." The patient said that he was still in some pain. But doing okay overall. He was unable to have his eyeglasses (readers) brought to him over the weekend; unable to reach friends via phone. This Probation officer will follow-up about any available glasses in a [place] area.

## 2018-08-25 DIAGNOSIS — R441 Visual hallucinations: Secondary | ICD-10-CM
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
## 2018-08-25 NOTE — Progress Notes (Addendum)
Chaplain followed up with patient GO:[id number] reader eyeglasses. Chaplain secured a pair of glasses after the patient's request and brought them to him. Patient was elated and confirmed that the glasses will work well for him and seem to be the appropriate strength of magnification.   Future Goals: Chaplain will continue to follow the patient throughout the duration of his stay to engage in further conversation and self-care to support his physical, mental, emotional and spiritual wellness.
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
## 2018-08-25 NOTE — Progress Notes (Signed)
   08/25/18 1000  Clinical Encounter Type  Visited With Patient  Visit Type Follow-up  Referral From Chaplain  Consult/Referral To [facility] followed up with the patient this morning. Sitter present. Upon arrival, the patient was sitting in recliner and reported that he was feeling better each day. The patient appears to be in good spirits and verbally expressed thanksgiving to God for his progress. During short visit, the patient's physician came in to talk with him. Chaplain will check back in with the patient at a later time.
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
## 2018-08-25 NOTE — Progress Notes (Signed)
[place] [facility] [place] NAME: Richard Vasquez    MR#:  [id number]  DATE OF BIRTH:  1960/04/18  SUBJECTIVE:   Pain is well controlled Sitter at bedside Worked with physical therapy and now sitting in the chair  REVIEW OF SYSTEMS:   Review of Systems  Constitutional: Negative for chills, diaphoresis and fever.  HENT: Negative for congestion and sore throat.   Eyes: Negative for blurred vision and double vision.  Respiratory: Negative for cough and shortness of breath.   Cardiovascular: Negative for chest pain and palpitations.  Gastrointestinal: Negative for nausea and vomiting.  Musculoskeletal: Positive for back pain and joint pain.  Neurological: Negative for dizziness and headaches.  Psychiatric/Behavioral: Negative for depression. The patient is not nervous/anxious.     DRUG ALLERGIES:  No Known Allergies  VITALS:  Blood pressure 95/71, pulse 92, temperature 97.8 F (36.6 C), resp. rate 18, height 5\' 10"  (1.778 m), weight 66.7 kg, SpO2 93 %.  PHYSICAL EXAMINATION:  GENERAL:  58 y.o.-year-old patient lying in the bed with no acute distress.  EYES: Pupils equal, round, reactive to light and accommodation. No scleral icterus. Extraocular muscles intact.  HEENT: Head atraumatic, normocephalic. Oropharynx and nasopharynx clear.  NECK:  Supple, no jugular venous distention. No thyroid enlargement, no tenderness.  Patient has cervical collar. LUNGS: Normal breath sounds bilaterally, no wheezing, rales,rhonchi or crepitation. No use of accessory muscles of respiration.  CARDIOVASCULAR: RRR, S1, S2 normal. No murmurs, rubs, or gallops.  ABDOMEN: Soft, nontender, nondistended. Bowel sounds present. No organomegaly or mass.  EXTREMITIES: No pedal edema, cyanosis, or clubbing. +Right knee immobilizer in place. NEUROLOGIC: Cranial nerves II through XII are intact. +global weakness. Sensation intact. Gait not checked.  PSYCHIATRIC: The patient is  alert and oriented x 2  SKIN: No obvious rash, lesion, or ulcer.  LABORATORY PANEL:   CBC Recent Labs  Lab 08/22/18 0523  WBC 10.1  HGB 10.2*  HCT 31.0*  PLT 178   ------------------------------------------------------------------------------------------------------------------  Chemistries  Recent Labs  Lab 08/20/18 0317  NA 142  K 4.5  CL 112*  CO2 26  GLUCOSE 91  BUN 13  CREATININE 0.51*  CALCIUM 8.3*   ------------------------------------------------------------------------------------------------------------------  Cardiac Enzymes No results for input(s): TROPONINI in the last 168 hours. ------------------------------------------------------------------------------------------------------------------  RADIOLOGY:  No results found.  ASSESSMENT AND PLAN:   * Left hip fracture  - s/p repair 08/18/18 by Dr. Mack Guise - Lovenox 40 mg subcutaneous daily x 4 weeks - Minimal weightbearing to left lower extremity - Waiting for discharge to skilled nursing facility - Will need to follow-up with Ortho in 2 weeks  * Right nondisplaced tibial plateau fracture - Ortho recommended nonoperative management - Knee immobilizer to right lower extremity - NWB to RLE  * Healing C2 fracture, seen by neurosurgery, patient is given new Aspen collar, informed to the patient to continue wearing the collar as fracture is not healed completely, follow-up with neurosurgery clinic in 3 to 4 weeks, and was seen by them on July 22.  * Schizophrenia - Patient is IVC'ed by the police department psychiatric consult placed. Discussed. - Continued home Zyprexa and Seroquel. - Psychiatry following patient.  All the records are reviewed and case discussed with Care Management/Social Worker Management plans discussed with the patient.  Waiting for insurance and SNF bed  CODE STATUS: Full code  TOTAL TIME TAKING CARE OF THIS PATIENT: 25 minutes.   Neita Carp M.D on 08/25/2018    Between 7am  to 6pm - Pager - [phone]  After 6pm go to [email or web address] - password [email or web address]  Office  [phone]  CC: Primary care physician; Patient, No Pcp Per  Note: This dictation was prepared with Dragon dictation along with smaller phrase technology. Any transcriptional errors that result from this process are unintentional.

## 2018-08-25 NOTE — TOC Progression Note (Addendum)
Transition of Care [facility]) - Progression Note    Patient Details  Name: KAEDEN MESTER MRN: [id number] Date of Birth: [birth year]  Transition of Care [facility]) CM/SW Contact  Ellon Marasco, Lenice Llamas Phone Number: [phone]  08/25/2018, 8:52 AM  Clinical Narrative: Clinical Social Worker ([place]) received a call from [place] admissions coordinator at [facility] stating they can't accept patient because they ran a background check and noted that patient is on the sex offender registry. CSW notified TOC lead and CSW Surveyor, quantity. This will be a barrier to SNF placement.    10:33 am CSW left a Advertising account executive for [facility] at [facility] in [place]. CSW left a Advertising account executive for [employer] for [facility] in [place]. CSW also left a Advertising account executive for [name] admissions coordinator at [facility].  11:40 am CSW received a voicemail from [place] admissions coordinator at [facility] stating they are not accepting difficult to place patients at this time.    Expected Discharge Plan: Skilled Nursing Facility Barriers to Discharge: Homeless with medical needs, Inadequate or no insurance, Continued Medical Work up, Requiring sitter/restraints  Expected Discharge Plan and Services Expected Discharge Plan: [place] In-house Referral: Clinical Social Work     Living arrangements for the past 2 months: Homeless                                       Social Determinants of Health (SDOH) Interventions    Readmission Risk Interventions No flowsheet data found.
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
## 2018-08-26 ENCOUNTER — Encounter: Payer: MEDICAID | Attending: Registered Nurse | Admitting: Physical Medicine & Rehabilitation

## 2018-08-26 NOTE — Progress Notes (Signed)
New stage 2 and stage 1 pressure injuries found on pt L and R buttock. Documented. AD and charge nurses notified. Education on turning and nutrition for pt. Will continue to turn and monitor. WOC order will be placed.
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
## 2018-08-26 NOTE — TOC Progression Note (Addendum)
Transition of Care [facility]) - Progression Note    Patient Details  Name: Richard Vasquez MRN: [id number] Date of Birth: 05/06/1960  [facility]) CM/SW Contact  Ival Pacer, Lenice Llamas Phone Number: [phone]  08/26/2018, 8:49 AM  Clinical Narrative: Patient has no bed offers. Barrier to SNF placement is that patient is on the sex offender registry and has no insurance. CSW discussed case with PT. PT has agreed to work with patient often and try to get him to the point he can walk. Barrier to patient walking is that he is non-weight bearing on both legs. CSW will continue to follow and assist as needed.    [facility] [place]: declined patient [facility]: declined patient [facility]: declined patient [facility]: declined patient [facility]: declined patient [facility]: declined patient [facility]: declined patient  10:32 am CSW attempted to contact patient's friend [name] to discuss D/C plan however she did not answer and a voicemail was left. CSW also left a voicemail for patient's friend Melina.  10:42 am CSW received a call back from [facility]. Per Melina she attends the same church as patient but she can't help him because she is 58 y.o. Melina suggested that CSW call their preacher Arrie Eastern [phone]. CSW met patient and made him aware of barrier to SNF placement. Patient gave CSW verbal permission to call his preacher Arrie Eastern to see if he can help patient. CSW contacted Romie Minus and made him aware of patient's needs. Per Romie Minus he will call some congregation members and see if he can assist in finding a place for patient to stay while he is recovering from surgery. Per Romie Minus he will call CSW back.      Expected Discharge Plan: Skilled Nursing Facility Barriers to Discharge: Homeless with medical needs, Inadequate or no insurance, Continued Medical Work up, Requiring sitter/restraints  Expected Discharge Plan and Services Expected Discharge Plan:  [place] In-house Referral: Clinical Social Work     Living arrangements for the past 2 months: Homeless                                       Social Determinants of Health (SDOH) Interventions    Readmission Risk Interventions No flowsheet data found.
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
## 2018-08-26 NOTE — Progress Notes (Signed)
[place] [place] NAME: Richard Vasquez    MR#:  [id number]  DATE OF BIRTH:  December 04, 1960  SUBJECTIVE:   Pain is well controlled IVC discontinued on 08/25/2018  REVIEW OF SYSTEMS:   Review of Systems  Constitutional: Negative for chills, diaphoresis and fever.  HENT: Negative for congestion and sore throat.   Eyes: Negative for blurred vision and double vision.  Respiratory: Negative for cough and shortness of breath.   Cardiovascular: Negative for chest pain and palpitations.  Gastrointestinal: Negative for nausea and vomiting.  Musculoskeletal: Positive for back pain and joint pain.  Neurological: Negative for dizziness and headaches.  Psychiatric/Behavioral: Negative for depression. The patient is not nervous/anxious.     DRUG ALLERGIES:  No Known Allergies  VITALS:  Blood pressure 90/72, pulse 62, temperature 97.7 F (36.5 C), temperature source Oral, resp. rate 17, height 5\' 10"  (1.778 m), weight 66.7 kg, SpO2 91 %.  PHYSICAL EXAMINATION:  GENERAL:  58 y.o.-year-old patient lying in the bed with no acute distress.  EYES: Pupils equal, round, reactive to light and accommodation. No scleral icterus. Extraocular muscles intact.  HEENT: Head atraumatic, normocephalic. Oropharynx and nasopharynx clear.  NECK:  Supple, no jugular venous distention. No thyroid enlargement, no tenderness.  Patient has cervical collar. LUNGS: Normal breath sounds bilaterally, no wheezing, rales,rhonchi or crepitation. No use of accessory muscles of respiration.  CARDIOVASCULAR: RRR, S1, S2 normal. No murmurs, rubs, or gallops.  ABDOMEN: Soft, nontender, nondistended. Bowel sounds present. No organomegaly or mass.  EXTREMITIES: No pedal edema, cyanosis, or clubbing. +Right knee immobilizer in place. NEUROLOGIC: Cranial nerves II through XII are intact. +global weakness. Sensation intact. Gait not checked.  PSYCHIATRIC: The patient is alert and oriented x 2   SKIN: No obvious rash, lesion, or ulcer.  LABORATORY PANEL:   CBC Recent Labs  Lab 08/22/18 0523  WBC 10.1  HGB 10.2*  HCT 31.0*  PLT 178   ------------------------------------------------------------------------------------------------------------------  Chemistries  Recent Labs  Lab 08/20/18 0317  NA 142  K 4.5  CL 112*  CO2 26  GLUCOSE 91  BUN 13  CREATININE 0.51*  CALCIUM 8.3*   ------------------------------------------------------------------------------------------------------------------  Cardiac Enzymes No results for input(s): TROPONINI in the last 168 hours. ------------------------------------------------------------------------------------------------------------------  RADIOLOGY:  No results found.  ASSESSMENT AND PLAN:   * Left hip fracture  - s/p repair 08/18/18 by Dr. Mack Guise - Lovenox 40 mg subcutaneous daily x 4 weeks - Minimal weightbearing to left lower extremity - Waiting for discharge to skilled nursing facility - Will need to follow-up with Ortho in 2 weeks  * Right nondisplaced tibial plateau fracture - Ortho recommended nonoperative management - Knee immobilizer to right lower extremity - NWB to RLE  * Healing C2 fracture, seen by neurosurgery, patient is given new Aspen collar, informed to the patient to continue wearing the collar as fracture is not healed completely, follow-up with neurosurgery clinic in 3 to 4 weeks, and was seen by them on July 22.  * Schizophrenia - Patient is IVC'ed by the police department psychiatric consult placed. Psychiatry team advised discontinuing IVC.  All the records are reviewed and case discussed with Care Management/Social Worker Management plans discussed with the patient.  Waiting for insurance and SNF bed  CODE STATUS: Full code  TOTAL TIME TAKING CARE OF THIS PATIENT: 25 minutes.   Neita Carp M.D on 08/26/2018   Between 7am to 6pm - Pager - [phone]  After 6pm go to  [email or web address] -  password [email or web address] [facility] [facility]  Office  [phone]  CC: Primary care physician; Patient, No Pcp Per  Note: This dictation was prepared with Dragon dictation along with smaller phrase technology. Any transcriptional errors that result from this process are unintentional.

## 2018-08-27 DIAGNOSIS — L8990 Pressure ulcer of unspecified site, unspecified stage: Secondary | ICD-10-CM | POA: Insufficient documentation
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
## 2018-08-27 NOTE — Progress Notes (Signed)
Chaplain followed up with the patient. Upon arrival, the patient was reclined in bed and reading his bible on the bedside table. He continues to have pleasant, welcoming, and polite demeanor. Today, he expressed thanksgiving for his health and progress. The patient said that he was able to go for a ride in the wheelchair, which was a welcome change. He noted that it was "difficult" to get into the chair, but refreshing to leave the room and reflect on the progress he's made over the last week. The patient said that he had several visitors today to help him put plans in place for his housing and other essential paperwork. He shared that he is grateful for all of the help is has received/is receiving. Chaplain talked with the patient about his spirituality and offered alternative ways to engage in the spiritual practice of fasting that will allow him to spend time with God that will allow him to meet his dietary needs during this critical recovery period. The patient seemed amenable to these considerations and may be open to further conversation around this.   Goals: Continue to support the patient's spiritual, emotional, and social needs through visitation. Gather resources to educate the patient on the spiritual practice of fasting as he seeks to deepen his faith.

## 2018-08-27 NOTE — TOC Progression Note (Addendum)
Transition of Care [facility]) - Progression Note    Patient Details  Name: Richard Vasquez MRN: [id number] Date of Birth: [birth year]  Transition of [facility] CM/SW Contact  Dillian Feig, Lenice Llamas Phone Number: [phone]  08/27/2018, 11:39 AM  Clinical Narrative: Clinical Social Worker ([place]) contacted [name] [phone] today to follow up on D/C plan. Per Romie Minus he is waiting on a phone call to confirm that he will have a room for patient in a house in [place]. CSW made Romie Minus aware that per surgeon patient will be non-weight bearing on both legs for 6 weeks starting the day of patient's surgery. CSW made Romie Minus aware that patient had surgery 08/18/2018 and 6 weeks from that date will be 09/29/2018. CSW made Romie Minus aware that the hospital will work on getting patient a wheel chair and bedside commode with drop arm. CSW also made Romie Minus aware that [place] will try to arrange charity home health for PT. CSW contacted [place] home health representative and made her aware of above. Per [place] she will contact her supervisor and will call CSW back. CSW also sent [place] DME agency representative an email making him aware of above. CSW will continue to follow and assist as needed.    3:24 pm: Per [name] home health representative they can accept patient for charity home health.    Expected Discharge Plan: Skilled Nursing Facility Barriers to Discharge: Homeless with medical needs, Inadequate or no insurance, Continued Medical Work up, Requiring sitter/restraints  Expected Discharge Plan and Services Expected Discharge Plan: [place] In-house Referral: Clinical Social Work     Living arrangements for the past 2 months: Homeless                                       Social Determinants of Health (SDOH) Interventions    Readmission Risk Interventions No flowsheet data found.
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
## 2018-08-27 NOTE — Consult Note (Signed)
[place] Nurse wound consult note Reason for Consult: Patient with new pressure areas to buttocks, Stage 1 (nonblanching erythema) on left buttock and Stage 2 (partial thickness) on right buttock. Wound type:pressure Pressure Injury POA: No Measurement:Left buttock Stage 1:  0.5cm round, no depth (nonblanching erythema only). Right buttock Stage 2: 1.5cm x 1cm x 0.1cm Wound bed:N/A for Stage 1, pink for Stage 2 Drainage (amount, consistency, odor) None Periwound:Intact Dressing procedure/placement/frequency: Patient has had consult with RD, but is refusing nutritional  supplements periodically stating he is "fasting". Guidance provided to Nursing for use of pressure redistribution chair cushion when patient is OOB to chair and for minimizing the supine position while in bed. Topical care will be provided by application of our house silicone foam product with changes every other day until areas are resolved. Nutrition and mobility will be important to trajectory of tissue repair and to prevent further tissue injury. Patient mattress is a pressure redistribution support surface and DermaTherapy bed linens are in place to reduce friction and shear force as well as mitigate microclimate issues such as perspiration.  [place] nursing team will not follow, but will remain available to this patient, the nursing and medical teams.  Please re-consult if needed. Thanks, Maudie Flakes, MSN, RN, [place], Arther Abbott  Pager# [phone]

## 2018-08-27 NOTE — Progress Notes (Signed)
[place] [facility] [place] NAME: Nyquan Selbe    MR#:  [id number]  DATE OF BIRTH:  12-May-1960  SUBJECTIVE:   Pain is well controlled IVC discontinued on 08/25/2018 Sitting in a chair today  REVIEW OF SYSTEMS:   Review of Systems  Constitutional: Negative for chills, diaphoresis and fever.  HENT: Negative for congestion and sore throat.   Eyes: Negative for blurred vision and double vision.  Respiratory: Negative for cough and shortness of breath.   Cardiovascular: Negative for chest pain and palpitations.  Gastrointestinal: Negative for nausea and vomiting.  Musculoskeletal: Positive for back pain and joint pain.  Neurological: Negative for dizziness and headaches.  Psychiatric/Behavioral: Negative for depression. The patient is not nervous/anxious.     DRUG ALLERGIES:  No Known Allergies  VITALS:  Blood pressure 96/68, pulse 78, temperature 98 F (36.7 C), resp. rate 18, height 5\' 10"  (1.778 m), weight 66.7 kg, SpO2 93 %.  PHYSICAL EXAMINATION:  GENERAL:  58 y.o.-year-old patient lying in the bed with no acute distress.  EYES: Pupils equal, round, reactive to light and accommodation. No scleral icterus. Extraocular muscles intact.  HEENT: Head atraumatic, normocephalic. Oropharynx and nasopharynx clear.  NECK:  Supple, no jugular venous distention. No thyroid enlargement, no tenderness.  Patient has cervical collar. LUNGS: Normal breath sounds bilaterally, no wheezing, rales,rhonchi or crepitation. No use of accessory muscles of respiration.  CARDIOVASCULAR: RRR, S1, S2 normal. No murmurs, rubs, or gallops.  ABDOMEN: Soft, nontender, nondistended. Bowel sounds present. No organomegaly or mass.  EXTREMITIES: No pedal edema, cyanosis, or clubbing. +Right knee immobilizer in place. NEUROLOGIC: Cranial nerves II through XII are intact. +global weakness. Sensation intact. Gait not checked.  PSYCHIATRIC: The patient is alert and oriented x 2   SKIN: No obvious rash, lesion, or ulcer.  LABORATORY PANEL:   CBC Recent Labs  Lab 08/22/18 0523  WBC 10.1  HGB 10.2*  HCT 31.0*  PLT 178   ------------------------------------------------------------------------------------------------------------------  Chemistries  No results for input(s): NA, K, CL, CO2, GLUCOSE, BUN, CREATININE, CALCIUM, MG, AST, ALT, ALKPHOS, BILITOT in the last 168 hours.  Invalid input(s): GFRCGP ------------------------------------------------------------------------------------------------------------------  Cardiac Enzymes No results for input(s): TROPONINI in the last 168 hours. ------------------------------------------------------------------------------------------------------------------  RADIOLOGY:  No results found.  ASSESSMENT AND PLAN:   * Left hip fracture  - s/p repair 08/18/18 by Dr. Mack Guise - Lovenox 40 mg subcutaneous daily x 4 weeks - Minimal weightbearing to left lower extremity - Waiting for discharge to skilled nursing facility - Will need to follow-up with Ortho in 2 weeks from surgery  * Right nondisplaced tibial plateau fracture - Ortho recommended nonoperative management - Knee immobilizer to right lower extremity - NWB to RLE  * Healing C2 fracture, seen by neurosurgery, patient is given new Aspen collar, informed to the patient to continue wearing the collar as fracture is not healed completely, follow-up with neurosurgery clinic in 3 to 4 weeks, and was seen by them on July 22.  * Schizophrenia - Patient is IVC'ed by the police department psychiatric consult placed. Psychiatry team advised discontinuing IVC.  All the records are reviewed and case discussed with Care Management/Social Worker Management plans discussed with the patient.  Patient is homeless and disposition needs to be figured out  CODE STATUS: Full code  TOTAL TIME TAKING CARE OF THIS PATIENT: 25 minutes.   Neita Carp M.D on 08/27/2018    Between 7am to 6pm - Pager - [phone]  After 6pm go  to [email or web address] - password [email or web address] [facility]  Office  [phone]  CC: Primary care physician; Patient, No Pcp Per  Note: This dictation was prepared with Dragon dictation along with smaller phrase technology. Any transcriptional errors that result from this process are unintentional.
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
## 2018-08-28 NOTE — TOC Progression Note (Addendum)
Transition of Care [facility]) - Progression Note    Patient Details  Name: Richard Vasquez MRN: [id number] Date of Birth: [birth year]  Transition of Care [facility]) CM/SW Contact  Jailynn Lavalais, Lenice Llamas Phone Number: [phone]  08/28/2018, 4:38 PM  Clinical Narrative: Clinical Social Worker (CSW) attempted to contact [name] [phone] however he did not answer and a voicemail was left. CSW received an email from [place] counselor stating that [facility] application has been started and patient's medicaid # is [id number] Q.   4:47 pm: CSW received a call back from [name] stating that he will have a room for patient in a house on [address] in [place]. Per Romie Minus the owners of the home will be getting the room ready this weekend, they have to move some furniture. Per Romie Minus they will be able to accept patient in the home likely Monday 8/3 in the afternoon. Per Romie Minus he has a used hospital bed that was donated to him that he is also going to let patient use at the home. Per Romie Minus he will follow up with CSW on Monday with the address of the house. [name] agency representative is aware of above. [name] home health representative is aware of above.     Expected Discharge Plan: Skilled Nursing Facility Barriers to Discharge: Homeless with medical needs, Inadequate or no insurance, Continued Medical Work up, Requiring sitter/restraints  Expected Discharge Plan and Services Expected Discharge Plan: [place] In-house Referral: Clinical Social Work     Living arrangements for the past 2 months: Homeless                                       Social Determinants of Health (SDOH) Interventions    Readmission Risk Interventions No flowsheet data found.

## 2018-08-29 LAB — CBC
HCT: 34.8 % — ABNORMAL LOW (ref 39.0–52.0)
Hemoglobin: 11.5 g/dL — ABNORMAL LOW (ref 13.0–17.0)
MCH: 29.7 pg (ref 26.0–34.0)
MCHC: 33 g/dL (ref 30.0–36.0)
MCV: 89.9 fL (ref 80.0–100.0)
Platelets: 295 10*3/uL (ref 150–400)
RBC: 3.87 MIL/uL — ABNORMAL LOW (ref 4.22–5.81)
RDW: 12.1 % (ref 11.5–15.5)
WBC: 14.2 10*3/uL — ABNORMAL HIGH (ref 4.0–10.5)
nRBC: 0 % (ref 0.0–0.2)

## 2018-08-29 LAB — CREATININE, SERUM
Creatinine, Ser: 0.57 mg/dL — ABNORMAL LOW (ref 0.61–1.24)
GFR calc Af Amer: >60 mL/min (ref >60)
GFR calc non Af Amer: >60 mL/min (ref >60)
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
## 2018-08-29 NOTE — Plan of Care (Signed)
  Problem: Education: Goal: Knowledge of General Education information will improve Description: Including pain rating scale, medication(s)/side effects and non-pharmacologic comfort measures 08/29/2018 0356 by Cheron Every, RN Outcome: Progressing 08/29/2018 0355 by Cheron Every, RN Outcome: Progressing   Problem: Health Behavior/Discharge Planning: Goal: Ability to manage health-related needs will improve 08/29/2018 0356 by Cheron Every, RN Outcome: Progressing 08/29/2018 0355 by Cheron Every, RN Outcome: Progressing   Problem: Clinical Measurements: Goal: Ability to maintain clinical measurements within normal limits will improve 08/29/2018 0356 by Cheron Every, RN Outcome: Progressing 08/29/2018 0355 by Cheron Every, RN Outcome: Progressing Goal: Will remain free from infection 08/29/2018 0356 by Cheron Every, RN Outcome: Progressing 08/29/2018 0355 by Cheron Every, RN Outcome: Progressing Goal: Diagnostic test results will improve 08/29/2018 0356 by Cheron Every, RN Outcome: Progressing 08/29/2018 0355 by Cheron Every, RN Outcome: Progressing Goal: Respiratory complications will improve 08/29/2018 0356 by Cheron Every, RN Outcome: Progressing 08/29/2018 [place] by Cheron Every, RN Outcome: Progressing Goal: Cardiovascular complication will be avoided 08/29/2018 0356 by Cheron Every, RN Outcome: Progressing 08/29/2018 0355 by Cheron Every, RN Outcome: Progressing   Problem: Activity: Goal: Risk for activity intolerance will decrease 08/29/2018 0356 by Cheron Every, RN Outcome: Progressing 08/29/2018 0355 by Cheron Every, RN Outcome: Progressing   Problem: Nutrition: Goal: Adequate nutrition will be maintained 08/29/2018 0356 by Cheron Every, RN Outcome: Progressing 08/29/2018 0355 by Cheron Every, RN Outcome: Progressing   Problem: Coping: Goal: Level of anxiety will  decrease 08/29/2018 0356 by Cheron Every, RN Outcome: Progressing 08/29/2018 0355 by Cheron Every, RN Outcome: Progressing   Problem: Elimination: Goal: Will not experience complications related to urinary retention 08/29/2018 0356 by Cheron Every, RN Outcome: Progressing 08/29/2018 0355 by Cheron Every, RN Outcome: Progressing   Problem: Pain Managment: Goal: General experience of comfort will improve 08/29/2018 0356 by Cheron Every, RN Outcome: Progressing 08/29/2018 0355 by Cheron Every, RN Outcome: Progressing   Problem: Safety: Goal: Ability to remain free from injury will improve 08/29/2018 0356 by Cheron Every, RN Outcome: Progressing 08/29/2018 0355 by Cheron Every, RN Outcome: Progressing   Problem: Skin Integrity: Goal: Risk for impaired skin integrity will decrease 08/29/2018 0356 by Cheron Every, RN Outcome: Progressing 08/29/2018 0355 by Cheron Every, RN Outcome: Progressing   Problem: Education: Goal: Verbalization of understanding the information provided (i.e., activity precautions, restrictions, etc) will improve 08/29/2018 0356 by Cheron Every, RN Outcome: Progressing 08/29/2018 0355 by Cheron Every, RN Outcome: Progressing   Problem: Activity: Goal: Ability to ambulate and perform ADLs will improve 08/29/2018 0356 by Cheron Every, RN Outcome: Progressing 08/29/2018 0355 by Cheron Every, RN Outcome: Progressing   Problem: Clinical Measurements: Goal: Postoperative complications will be avoided or minimized 08/29/2018 0356 by Cheron Every, RN Outcome: Progressing 08/29/2018 0355 by Cheron Every, RN Outcome: Progressing   Problem: Self-Concept: Goal: Ability to maintain and perform role responsibilities to the fullest extent possible will improve 08/29/2018 0356 by Cheron Every, RN Outcome: Progressing 08/29/2018 0355 by Cheron Every, RN Outcome:  Progressing   Problem: Pain Management: Goal: Pain level will decrease 08/29/2018 0356 by Cheron Every, RN Outcome: Progressing 08/29/2018 [place] by Cheron Every, RN Outcome: Progressing
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
## 2018-08-29 NOTE — Progress Notes (Signed)
[facility] at [place] NAME: Richard Vasquez    MR#:  [id number]  DATE OF BIRTH:  1960/10/11  SUBJECTIVE:   Pain is well controlled  No SOB/abd pain.  REVIEW OF SYSTEMS:   Review of Systems  Constitutional: Negative for chills, diaphoresis and fever.  HENT: Negative for congestion and sore throat.   Eyes: Negative for blurred vision and double vision.  Respiratory: Negative for cough and shortness of breath.   Cardiovascular: Negative for chest pain and palpitations.  Gastrointestinal: Negative for nausea and vomiting.  Musculoskeletal: Positive for back pain and joint pain.  Neurological: Negative for dizziness and headaches.  Psychiatric/Behavioral: Negative for depression. The patient is not nervous/anxious.     DRUG ALLERGIES:  No Known Allergies  VITALS:  Blood pressure 109/78, pulse 75, temperature 98.2 F (36.8 C), temperature source Oral, resp. rate 18, height 5\' 10"  (1.778 m), weight 66.7 kg, SpO2 96 %.  PHYSICAL EXAMINATION:  GENERAL:  58 y.o.-year-old patient lying in the bed with no acute distress.  EYES: Pupils equal, round, reactive to light and accommodation. No scleral icterus. Extraocular muscles intact.  HEENT: Head atraumatic, normocephalic. Oropharynx and nasopharynx clear.  NECK:  Supple, no jugular venous distention. No thyroid enlargement, no tenderness.  Patient has cervical collar. LUNGS: Normal breath sounds bilaterally, no wheezing, rales,rhonchi or crepitation. No use of accessory muscles of respiration.  CARDIOVASCULAR: RRR, S1, S2 normal. No murmurs, rubs, or gallops.  ABDOMEN: Soft, nontender, nondistended. Bowel sounds present. No organomegaly or mass.  EXTREMITIES: No pedal edema, cyanosis, or clubbing. +Right knee immobilizer in place. NEUROLOGIC: Cranial nerves II through XII are intact. +global weakness. Sensation intact. Gait not checked.  PSYCHIATRIC: The patient is alert and oriented x 2  SKIN: No  obvious rash, lesion, or ulcer.  LABORATORY PANEL:   CBC Recent Labs  Lab 08/29/18 0347  WBC 14.2*  HGB 11.5*  HCT 34.8*  PLT 295   ------------------------------------------------------------------------------------------------------------------  Chemistries  Recent Labs  Lab 08/29/18 0347  CREATININE 0.57*   ------------------------------------------------------------------------------------------------------------------  Cardiac Enzymes No results for input(s): TROPONINI in the last 168 hours. ------------------------------------------------------------------------------------------------------------------  RADIOLOGY:  No results found.  ASSESSMENT AND PLAN:   * Left hip fracture  - s/p repair 08/18/18 by Dr. Mack Guise - Lovenox 40 mg subcutaneous daily x 4 weeks - Minimal weightbearing to left lower extremity - Waiting for discharge to skilled nursing facility - Will need to follow-up with Ortho in 2 weeks from surgery  * Right nondisplaced tibial plateau fracture - Ortho recommended nonoperative management - Knee immobilizer to right lower extremity - NWB to RLE  * Healing C2 fracture, seen by neurosurgery, patient is given new Aspen collar, informed to the patient to continue wearing the collar as fracture is not healed completely, follow-up with neurosurgery clinic in 3 to 4 weeks, and was seen by them on July 22.  * Schizophrenia - Patient is IVC'ed by the police department psychiatric consult placed. Psychiatry team advised discontinuing IVC.  All the records are reviewed and case discussed with Care Management/Social Worker Management plans discussed with the patient.  Patient is homeless and disposition needs to be figured out  CODE STATUS: Full code  TOTAL TIME TAKING CARE OF THIS PATIENT: 20 minutes.   Neita Carp M.D on 08/29/2018   Between 7am to 6pm - Pager - [phone]  After 6pm go to [email or web address] - password [email or web address] [place] [facility]  Office  [phone]  CC: Primary  care physician; Patient, No Pcp Per  Note: This dictation was prepared with Dragon dictation along with smaller phrase technology. Any transcriptional errors that result from this process are unintentional.

## 2018-08-29 NOTE — Progress Notes (Signed)
[facility] at [place] NAME: Atthew Coutant    MR#:  [id number]  DATE OF BIRTH:  [birth year]  SUBJECTIVE:   Sitting in bed Eating well No concerns  REVIEW OF SYSTEMS:   Review of Systems  Constitutional: Negative for chills, diaphoresis and fever.  HENT: Negative for congestion and sore throat.   Eyes: Negative for blurred vision and double vision.  Respiratory: Negative for cough and shortness of breath.   Cardiovascular: Negative for chest pain and palpitations.  Gastrointestinal: Negative for nausea and vomiting.  Musculoskeletal: Positive for back pain and joint pain.  Neurological: Negative for dizziness and headaches.  Psychiatric/Behavioral: Negative for depression. The patient is not nervous/anxious.    DRUG ALLERGIES:  No Known Allergies  VITALS:  Blood pressure 109/78, pulse 75, temperature 98.2 F (36.8 C), temperature source Oral, resp. rate 18, height 5\' 10"  (1.778 m), weight 66.7 kg, SpO2 96 %.  PHYSICAL EXAMINATION:  GENERAL:  58 y.o.-year-old patient lying in the bed with no acute distress.  EYES: Pupils equal, round, reactive to light and accommodation. No scleral icterus. Extraocular muscles intact.  HEENT: Head atraumatic, normocephalic. Oropharynx and nasopharynx clear.  NECK:  Supple, no jugular venous distention. No thyroid enlargement, no tenderness.  Patient has cervical collar. LUNGS: Normal breath sounds bilaterally, no wheezing, rales,rhonchi or crepitation. No use of accessory muscles of respiration.  CARDIOVASCULAR: RRR, S1, S2 normal. No murmurs, rubs, or gallops.  ABDOMEN: Soft, nontender, nondistended. Bowel sounds present. No organomegaly or mass.  EXTREMITIES: No pedal edema, cyanosis, or clubbing. +Right knee immobilizer in place. NEUROLOGIC: Cranial nerves II through XII are intact. +global weakness. Sensation intact. Gait not checked.  PSYCHIATRIC: The patient is alert and oriented x 2  SKIN: No  obvious rash, lesion, or ulcer.  LABORATORY PANEL:   CBC Recent Labs  Lab 08/29/18 0347  WBC 14.2*  HGB 11.5*  HCT 34.8*  PLT 295   ------------------------------------------------------------------------------------------------------------------  Chemistries  Recent Labs  Lab 08/29/18 0347  CREATININE 0.57*   ------------------------------------------------------------------------------------------------------------------  Cardiac Enzymes No results for input(s): TROPONINI in the last 168 hours. ------------------------------------------------------------------------------------------------------------------  RADIOLOGY:  No results found.  ASSESSMENT AND PLAN:   * Left hip fracture  - s/p repair 08/18/18 by Dr. Mack Guise - Lovenox 40 mg subcutaneous daily x 4 weeks - Minimal weightbearing to left lower extremity - Waiting for discharge to skilled nursing facility - Will need to follow-up with Ortho in 2 weeks from surgery  * Right nondisplaced tibial plateau fracture - Ortho recommended nonoperative management - Knee immobilizer to right lower extremity - NWB to RLE  * Healing C2 fracture, seen by neurosurgery, patient is given new Aspen collar, informed to the patient to continue wearing the collar as fracture is not healed completely, follow-up with neurosurgery clinic in 3 to 4 weeks, and was seen by them on July 22.  * Schizophrenia - Patient is IVC'ed by the police department psychiatric consult placed. Psychiatry team advised discontinuing IVC.  All the records are reviewed and case discussed with Care Management/Social Worker Management plans discussed with the patient.  Patient is homeless and disposition needs to be figured out  CODE STATUS: Full code  TOTAL TIME TAKING CARE OF THIS PATIENT: 20 minutes.   Neita Carp M.D on 08/29/2018   Between 7am to 6pm - Pager - [phone]  After 6pm go to [email or web address] - password [email or web address] [place] [facility]  Office  [phone]  CC: Primary care physician;  Patient, No Pcp Per  Note: This dictation was prepared with Dragon dictation along with smaller phrase technology. Any transcriptional errors that result from this process are unintentional.

## 2018-08-30 NOTE — Progress Notes (Signed)
[place] at [place] NAME: Lavontae Cornia    MR#:  [id number]  DATE OF BIRTH:  10-27-1960  SUBJECTIVE:   Slept well.  Afebrile.  Pain well controlled.  REVIEW OF SYSTEMS:   Review of Systems  Constitutional: Negative for chills, diaphoresis and fever.  HENT: Negative for congestion and sore throat.   Eyes: Negative for blurred vision and double vision.  Respiratory: Negative for cough and shortness of breath.   Cardiovascular: Negative for chest pain and palpitations.  Gastrointestinal: Negative for nausea and vomiting.  Musculoskeletal: Positive for back pain and joint pain.  Neurological: Negative for dizziness and headaches.  Psychiatric/Behavioral: Negative for depression. The patient is not nervous/anxious.    DRUG ALLERGIES:  No Known Allergies  VITALS:  Blood pressure 115/82, pulse 73, temperature 97.7 F (36.5 C), temperature source Oral, resp. rate 19, height 5\' 10"  (1.778 m), weight 66.7 kg, SpO2 95 %.  PHYSICAL EXAMINATION:  GENERAL:  59 y.o.-year-old patient lying in the bed with no acute distress.  EYES: Pupils equal, round, reactive to light and accommodation. No scleral icterus. Extraocular muscles intact.  HEENT: Head atraumatic, normocephalic. Oropharynx and nasopharynx clear.  NECK:  Supple, no jugular venous distention. No thyroid enlargement, no tenderness.  Patient has cervical collar. LUNGS: Normal breath sounds bilaterally, no wheezing, rales,rhonchi or crepitation. No use of accessory muscles of respiration.  CARDIOVASCULAR: RRR, S1, S2 normal. No murmurs, rubs, or gallops.  ABDOMEN: Soft, nontender, nondistended. Bowel sounds present. No organomegaly or mass.  EXTREMITIES: No pedal edema, cyanosis, or clubbing. +Right knee immobilizer in place. NEUROLOGIC: Cranial nerves II through XII are intact. +global weakness. Sensation intact. Gait not checked.  PSYCHIATRIC: The patient is alert and oriented x 2  SKIN: No  obvious rash, lesion, or ulcer.  LABORATORY PANEL:   CBC Recent Labs  Lab 08/29/18 0347  WBC 14.2*  HGB 11.5*  HCT 34.8*  PLT 295   ------------------------------------------------------------------------------------------------------------------  Chemistries  Recent Labs  Lab 08/29/18 0347  CREATININE 0.57*   ------------------------------------------------------------------------------------------------------------------  Cardiac Enzymes No results for input(s): TROPONINI in the last 168 hours. ------------------------------------------------------------------------------------------------------------------  RADIOLOGY:  No results found.  ASSESSMENT AND PLAN:   * Left hip fracture  - s/p repair 08/18/18 by Dr. Mack Guise - Lovenox 40 mg subcutaneous daily x 4 weeks - Minimal weightbearing to left lower extremity - Waiting for discharge to skilled nursing facility - Will need to follow-up with Ortho in 2 weeks from surgery  * Right nondisplaced tibial plateau fracture - Ortho recommended nonoperative management - Knee immobilizer to right lower extremity - NWB to RLE  * Healing C2 fracture, seen by neurosurgery, patient is given new Aspen collar, informed to the patient to continue wearing the collar as fracture is not healed completely, follow-up with neurosurgery clinic in 3 to 4 weeks, and was seen by them on July 22.  * Schizophrenia - Patient is IVC'ed by the police department psychiatric consult placed. Psychiatry team advised discontinuing IVC.  All the records are reviewed and case discussed with Care Management/Social Worker Management plans discussed with the patient.  Patient is homeless and disposition needs to be figured out  CODE STATUS: Full code  TOTAL TIME TAKING CARE OF THIS PATIENT: 20 minutes.   Neita Carp M.D on 08/30/2018   Between 7am to 6pm - Pager - [phone]  After 6pm go to [email or web address] - password [email or web address] [place] [facility]  Office  [phone]  CC: Primary care  physician; Patient, No Pcp Per  Note: This dictation was prepared with Dragon dictation along with smaller phrase technology. Any transcriptional errors that result from this process are unintentional.

## 2018-08-30 NOTE — Progress Notes (Signed)
Patient has been up in wheelchair this shift propelling around unit and tolerated well.  Medicated for pain per request x 1. No distress noted.
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
## 2018-08-31 MED ORDER — TRAZODONE HCL 100 MG PO TABS: 100.0000 mg | ORAL_TABLET | Freq: Every day | ORAL | 1 refills | Status: DC

## 2018-08-31 MED ORDER — ASPIRIN EC 325 MG PO TBEC: 325.0000 mg | DELAYED_RELEASE_TABLET | Freq: Two times a day (BID) | ORAL | 3 refills | Status: AC

## 2018-08-31 MED ORDER — ENSURE ENLIVE PO LIQD: 237.0000 mL | Freq: Two times a day (BID) | ORAL | 12 refills | Status: DC

## 2018-08-31 MED ORDER — ENOXAPARIN SODIUM 40 MG/0.4ML ~~LOC~~ SOLN: 40.0000 mg | SUBCUTANEOUS | 0 refills | Status: DC

## 2018-08-31 MED ORDER — OXYCODONE HCL 5 MG PO TABS: 5.0000 mg | ORAL_TABLET | ORAL | 0 refills | Status: DC | PRN

## 2018-08-31 MED ORDER — DOCUSATE SODIUM 100 MG PO CAPS: 100.0000 mg | ORAL_CAPSULE | Freq: Two times a day (BID) | ORAL | 1 refills | Status: DC

## 2018-08-31 MED ORDER — QUETIAPINE FUMARATE 200 MG PO TABS: 200.0000 mg | ORAL_TABLET | Freq: Every day | ORAL | 1 refills | Status: DC

## 2018-08-31 MED ORDER — GABAPENTIN 300 MG PO CAPS: 300.0000 mg | ORAL_CAPSULE | Freq: Three times a day (TID) | ORAL | 1 refills | Status: DC

## 2018-08-31 MED ORDER — OLANZAPINE 5 MG PO TABS: 5.0000 mg | ORAL_TABLET | Freq: Every day | ORAL | 1 refills | Status: DC

## 2018-08-31 MED ORDER — METHOCARBAMOL 500 MG PO TABS: 500.0000 mg | ORAL_TABLET | Freq: Four times a day (QID) | ORAL | 1 refills | Status: DC | PRN

## 2018-08-31 MED ORDER — POLYETHYLENE GLYCOL 3350 17 G PO PACK: 17.0000 g | PACK | Freq: Every day | ORAL | 1 refills | Status: DC

## 2018-08-31 NOTE — TOC Progression Note (Signed)
Transition of Care [facility]) - Progression Note    Patient Details  Name: Richard Vasquez MRN: [id number] Date of Birth: 10-06-1960  Transition of Care [facility]) CM/SW [place], RN Phone Number: 08/31/2018, 9:56 AM  Clinical Narrative:     Spoke with [place] [facility] and asked if they would be able to assist the patient with Ensure, she stated that she would have [name] call me that is who manages that part of the program   Expected Discharge Plan: [place] Barriers to Discharge: Homeless with medical needs, Inadequate or no insurance, Continued Medical Work up, Requiring sitter/restraints  Expected Discharge Plan and Services Expected Discharge Plan: [place] In-house Referral: Clinical Social Work     Living arrangements for the past 2 months: Homeless Expected Discharge Date: 08/31/18                                     Social Determinants of Health (SDOH) Interventions    Readmission Risk Interventions No flowsheet data found.

## 2018-08-31 NOTE — TOC Progression Note (Signed)
Transition of Care [facility]) - Progression Note    Patient Details  Name: Richard Vasquez MRN: [id number] Date of Birth: 12-26-1960  Transition of Care [facility]) CM/SW [place], RN Phone Number: 08/31/2018, 12:15 PM  Clinical Narrative:     Damaris Schooner with [employer] the preacher that is helping the patient, he stated the address that the patient is going to is [place].  I let him know that the patietn is recommended to have ensure/enlive drinks I triued to get assistance with eldercare and they do not have any at this time to help.  He stated that he would get that taken care of.  I called Brad with [employer] to have the [place] and BSC delivered to the address and called [place] with [facility] with the address as well for [facility] services   Expected Discharge Plan: [place] Barriers to Discharge: Homeless with medical needs, Inadequate or no insurance, Continued Medical Work up, Requiring sitter/restraints  Expected Discharge Plan and Services Expected Discharge Plan: [place] In-house Referral: Clinical Social Work     Living arrangements for the past 2 months: Homeless Expected Discharge Date: 08/31/18                                     Social Determinants of Health (SDOH) Interventions    Readmission Risk Interventions No flowsheet data found.

## 2018-08-31 NOTE — TOC Transition Note (Signed)
Transition of Care [facility]) - CM/SW Discharge Note   Patient Details  Name: Richard Vasquez MRN: [id number] Date of Birth: 1961-01-01  Transition of Care [facility]) CM/SW Contact:  Su Hilt, RN Phone Number: 08/31/2018, 4:07 PM   Clinical Narrative:    The patient is discharging to a home managed by the church at [place], I was able to get all medications for him thru medication management except for pain medication and colace.  He has an appointment with [facility] set up for follow up, He has [place] PT OT and Nurse set up with [facility] and [place] Verified that they are accepting the patient thru charity.  He is to be transported to the home with EMS, [employer] is shipping a WC and BSC to the address as well.       Barriers to Discharge: Homeless with medical needs, Inadequate or no insurance, Continued Medical Work up, Requiring sitter/restraints   Patient Goals and CMS Choice Patient states their goals for this hospitalization and ongoing recovery are:: Pain control      Discharge Placement                       Discharge Plan and Services In-house Referral: Clinical Social Work                                   Social Determinants of Health (SDOH) Interventions     Readmission Risk Interventions Readmission Risk Prevention Plan 08/31/2018  Transportation Screening Complete  PCP or Specialist Appt within 3-5 Days Complete  HRI or [place] Complete  Palliative Care Screening Not Applicable  Medication Review (RN Care Manager) Complete  Some recent data might be hidden

## 2018-08-31 NOTE — TOC Progression Note (Signed)
Transition of Care [facility]) - Progression Note    Patient Details  Name: Richard Vasquez MRN: [id number] Date of Birth: July 05, 1960  Transition of Care [facility]) CM/SW [place], RN Phone Number: 08/31/2018, 1:20 PM  Clinical Narrative:    Faxed  Over to med mgt the prescriptions for this patient for assistance with medicaitons   Expected Discharge Plan: [place] Barriers to Discharge: Homeless with medical needs, Inadequate or no insurance, Continued Medical Work up, Requiring sitter/restraints  Expected Discharge Plan and Services Expected Discharge Plan: [place] In-house Referral: Clinical Social Work     Living arrangements for the past 2 months: Homeless Expected Discharge Date: 08/31/18                                     Social Determinants of Health (SDOH) Interventions    Readmission Risk Interventions No flowsheet data found.
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
## 2018-08-31 NOTE — Progress Notes (Signed)
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  A walker will not resolve  issue with performing activities of daily living. A wheelchair will allow patient to safely perform daily activities. Patient is not able to propel themselves in the home using a standard weight wheelchair due to general weakness. Patient can self propel in the lightweight wheelchair. Length of need Lifetime.  With elevated leg rest and back and seat cushions Accessories: elevating leg rests (ELRs), wheel locks, extensions and anti-tippers.

## 2018-08-31 NOTE — TOC Progression Note (Signed)
Transition of Care [facility]) - Progression Note    Patient Details  Name: Richard Vasquez MRN: [id number] Date of Birth: 1960/03/21  Transition of Care [facility]) CM/SW Contact  Su Hilt, RN Phone Number: 08/31/2018, 9:48 AM  Clinical Narrative:    Spoke with the patient rep at [employer] [facility], She stated that they are unable to assist with the ensure, and can't assist with the Lovenox and unable to assist with pain medication.  I sent a message to the physician requesting the lovenox to be changed to aspirin if possible due to the patient not having insurance and unable to get the medicaiton.      Expected Discharge Plan: Skilled Nursing Facility Barriers to Discharge: Homeless with medical needs, Inadequate or no insurance, Continued Medical Work up, Requiring sitter/restraints  Expected Discharge Plan and Services Expected Discharge Plan: [place] In-house Referral: Clinical Social Work     Living arrangements for the past 2 months: Homeless Expected Discharge Date: 08/31/18                                     Social Determinants of Health (SDOH) Interventions    Readmission Risk Interventions No flowsheet data found.

## 2018-08-31 NOTE — Progress Notes (Signed)
OT Cancellation Note  Patient Details Name: Richard Vasquez MRN: [id number] DOB: [birth year]   Cancelled Treatment:    Reason Eval/Treat Not Completed: Other (comment). Pt with nursing preparing for discharge. Will re-attempt OT tx as appropriate.   Jeni Salles, MPH, MS, OTR/L ascom [phone] 08/31/18, 3:46 PM

## 2018-08-31 NOTE — Progress Notes (Signed)
   08/31/18 1400  Clinical Encounter Type  Visited With Patient  Visit Type Follow-up  Referral From [place] followed up with the patient during rounds. Upon arrival, the patient was sitting up in bed and reading. He was warm and welcoming, noting that he had "good news" to share. He did not appear to be in pain. The patient shared that his pastor has found him a room in a halfway house after his discharge, which was welcome news, so that he does not have to go to a SNF. The patient shared some of his personal and familial histories and talked about his sister in [place]. He also talked about both the challenges he has had in life and the progress made in the wake of those recent challenges. The patient expressed thanksgiving for the ways he sees God working on his behalf, noting that "God has still has a purpose and plan for him being on earth."   Chaplain shared several printed articles on the spiritual practice of fasting and talked with the patient about his thoughts/plans going forward. The patient expressed gratitude for the information and looks forward to reading, sharing, and talking with his pastor to learn more. The patient is open to fasting though other means that take into consideration his specific and high nutritional needs during this period of recovery and rehabilitation. The patient reports that he had good meals today and understands that he wants to enjoy the meals while here in the hospital because of his food insecurity.   Patient expressed gratitude for the visit and requested prayer for his continued recovery. Chaplain and patient engaged in prayer.

## 2018-09-01 NOTE — Progress Notes (Signed)
8/4: Clinical Education officer, museum (CSW) received a call today from [employer] [phone]. Paulette is taking patient to appointments and helping him get services. Paulette requested that [facility] home health call her to schedule appointments because patient does not have a phone. CSW made [place] home health representative aware of above. CSW also asked Lizbeth Bark financial counselor to contact Paulette about patient's medicaid and disability application because patient does not have a phone. CSW made Paulette aware that patient does not meet the age requirement to get meals on wheels because he is not 60 yet. CSW gave Paulette contact information for congregational RN Anne Ng phone # [phone].  [employer], LCSW [phone]

## 2018-09-01 NOTE — Progress Notes (Signed)
8/4: Clinical Education officer, museum (CSW) contacted [name] and provided him with congregational RN Anne Ng phone # [phone]. Per Romie Minus patient's wheel chair and bedside commode did not get delivered yesterday. RN case manager sent [place] [employer] agency representative a message making him aware of above.   [employer], LCSW [phone]

## 2018-09-01 NOTE — Progress Notes (Signed)
8/4: [name] asked if patient can get meals on wheels. Clinical Education officer, museum (CSW) contacted [employer] on wheels [phone] and was told that patient does not meet the age requirement. You must be 60 years and older to qualify for meals on wheels. CSW contacted Romie Minus and made him aware of above.   [employer], LCSW [phone]

## 2018-09-16 NOTE — Progress Notes (Signed)
8/19: Clinical Social Worker (CSW) received a call yesterday 8/18 from [facility] [phone] stating that she transported Richard Vasquez to his appointment at [facility] with Dr. Izora Ribas however they could not afford the $100 co-pay. CSW gave [name] patient's pending medicaid # [id number]. CSW also contacted neuro PA Estill Bamberg and made her aware of above. CSW provided pending medical # to [place] as well. Per Estill Bamberg she will talk to the office manager.  [employer], LCSW [phone]

## 2018-11-04 ENCOUNTER — Ambulatory Visit: Payer: Self-pay

## 2018-11-04 ENCOUNTER — Ambulatory Visit: Payer: Self-pay | Admitting: Pharmacy Technician

## 2018-11-04 ENCOUNTER — Encounter (INDEPENDENT_AMBULATORY_CARE_PROVIDER_SITE_OTHER): Payer: Self-pay

## 2018-11-04 ENCOUNTER — Other Ambulatory Visit: Payer: Self-pay

## 2018-11-04 DIAGNOSIS — Z79899 Other long term (current) drug therapy: Secondary | ICD-10-CM

## 2018-11-04 NOTE — Progress Notes (Signed)
Completed financial assistance application for [place] due to recent hospital visit. Patient agreed to be responsible for gathering financial information and forwarding to appropriate department in [facility].    Completed [facility] application and contract.  Patient agreed to all terms of the [facility] contract.    Patient approved to receive medication assistance at [facility] as long as eligibility criteria continues to be met.    Provided patient with Civil engineer, contracting based on his particular needs.    Referring patient to [facility].  [place] [facility]

## 2018-11-04 NOTE — Progress Notes (Cosign Needed Addendum)
[facility] Clinic Visit Note  Patient: Richard Vasquez MRN: [id number] Date of Birth: January 07, 1961 PCP: Patient, No Pcp Per   Richard Vasquez 58 y.o. male presents for a medication therapy management visit today.  There were no vitals taken for this visit.  Patient Information   Past Medical History:  Diagnosis Date  . BPH (benign prostatic hyperplasia)   . Chronic pain   . Closed fracture of distal end of right femur (Richard Vasquez) 06/09/2018  . Depression       Past Surgical History:  Procedure Laterality Date  . FRACTURE SURGERY    . HIP PINNING,CANNULATED Left 08/18/2018   Procedure: CANNULATED HIP PINNING, Right knee aspiration;  Surgeon: Thornton Park, MD;  Location: [facility] ORS;  Service: Orthopedics;  Laterality: Left;  . ORIF FEMUR FRACTURE Right 06/10/2018   Procedure: OPEN REDUCTION INTERNAL FIXATION (ORIF) DISTAL FEMUR FRACTURE;  Surgeon: Shona Needles, MD;  Location: [facility];  Service: Orthopedics;  Laterality: Right;     Family History  Problem Relation Age of Onset  . Cancer Mother   . Diabetes Mother    New Diagnoses (since last visit): none  Family Support: Good  Lifestyle Diet: Breakfast: eggs, [place] bacon Lunch: sandwich (eggs, cheese, bologna) Dinner: chicken, veggies, corn, tuna fish Drinks: water, coffee (black), green tea Snacks: chips, nabs (patient stated he cut back on sweets)       Social History   Substance and Sexual Activity  Alcohol Use Not Currently    Social History   Tobacco Use  Smoking Status Current Some Day Smoker  . Packs/day: 1.00  . Years: 20.00  . Pack years: 20.00  . Types: Cigarettes  Smokeless Tobacco Never Used    Health Maintenance  Topic Date Due  . Hepatitis C Screening  [birth year]  . COLONOSCOPY  11/05/2010  . INFLUENZA VACCINE  08/29/2018  . TETANUS/TDAP  08/16/2028  . HIV Screening  Completed   Health Maintenance/Date Completed Last ED visit: 08/17/2018 Last Visit to PCP: n/a Next Visit to  PCP: [facility] Specialist Visit: Orthopedic: 11/11/2018 [facility] psychiatrist: 11/19/2018 Dental Exam: no recent Eye Exam: no recent Prostate Exam: no recent DEXA: no recent Colonoscopy: no recent Flu Vaccine: never received patient might get flu shot this year 2020 Pneumonia Vaccine: no  Outpatient Encounter Medications as of 11/04/2018  Medication Sig  . aspirin EC 325 MG tablet Take 1 tablet (325 mg total) by mouth 2 (two) times a day.  . gabapentin (NEURONTIN) 300 MG capsule Take 1 capsule (300 mg total) by mouth 3 (three) times daily.  Marland Kitchen ibuprofen (ADVIL) 800 MG tablet Take 800 mg by mouth every 8 (eight) hours as needed. Patient takes one in the morning and one at night  . methocarbamol (ROBAXIN) 500 MG tablet Take 1 tablet (500 mg total) by mouth every 6 (six) hours as needed for muscle spasms.  Marland Kitchen OLANZapine (ZYPREXA) 5 MG tablet Take 1 tablet (5 mg total) by mouth daily.  . QUEtiapine (SEROQUEL) 200 MG tablet Take 1 tablet (200 mg total) by mouth at bedtime.  . traZODone (DESYREL) 100 MG tablet Take 1 tablet (100 mg total) by mouth at bedtime.  . docusate sodium (COLACE) 100 MG capsule Take 1 capsule (100 mg total) by mouth 2 (two) times daily. (Patient not taking: Reported on 11/04/2018)  . feeding supplement, ENSURE ENLIVE, (ENSURE ENLIVE) LIQD Take 237 mLs by mouth 2 (two) times daily between meals. (Patient not taking: Reported on  11/04/2018)  . oxyCODONE (OXY IR/ROXICODONE) 5 MG immediate release tablet Take 1-2 tablets (5-10 mg total) by mouth every 4 (four) hours as needed for moderate pain (pain score 4-6). (Patient not taking: Reported on 11/04/2018)  . polyethylene glycol (MIRALAX / GLYCOLAX) 17 g packet Take 17 g by mouth daily. (Patient not taking: Reported on 11/04/2018)   No facility-administered encounter medications on file as of 11/04/2018.     Assessment: Depression: trazodone, quetiapine, olanzapine Patient stated he is very thankful  for all the help from the hospital and the medication management clinic. He feels as if the medications are helping with his depression. Patient has a scheduled appointment with psychiatrics on 11/19/2018.   Pain/muscle spasms: gabapentin, methocarbamol, and PRN ibuprofen Patient stated the the gabapentin is working great. He stated he takes ibuprofen 800 mg  twice a day (1 tablet by mouth in the morning and 1 tablet by mouth in the evening). The ibuprofen helps with pain when patient walks more or does extra stretches. Patient stated he is doing better getting around house. Patient started with a walker and has transitioned to using a cane. Patient has a nurse come to home to monitor progress with leg.   Lab results: Patients cholesterol and LDL were elevated on 06/2018.  Adherence: Patient denies missing doses. He stated he is aware he needs the medication in order to feel better.   Plan: Depression: Contact doctor immediatly if depression worsens. Encouraged patient to continue not to miss doses.  Advised patient to inform doctor on 11/19/2018 that the patient needs refills on medications to prevent break in therapy.  Continue medication.  Pain: Patient encouraged to do as much exercises as he can without making his leg condition worse. Patient informed that 10 minutes a day is a great start to exercising.   Lab results: Patient was informed of the lab results for his cholesterol and LDL.  Education was provided on ways to lower cholesterol and LDL.  Patient encouraged to get lab work follow up when established with [facility].   Adherence: Encouraged to continue medications.   Patient was mailed a smoking cessation handout.  Patient is being set up with [facility].    Chaney Vasquez, PharmD candidate [facility]

## 2019-01-14 ENCOUNTER — Telehealth: Payer: Self-pay | Admitting: Pharmacy Technician

## 2019-01-14 NOTE — Telephone Encounter (Signed)
Patient has prescription drug coverage with Medicaid.  No longer meets the eligibility criteria for [facility].  Patient notified.  [place]  [facility]

## 2020-03-02 ENCOUNTER — Ambulatory Visit: Payer: Self-pay | Admitting: Family Medicine

## 2020-03-17 ENCOUNTER — Encounter: Payer: Self-pay | Admitting: Adult Health

## 2020-03-17 ENCOUNTER — Telehealth: Payer: Self-pay | Admitting: Adult Health

## 2020-03-17 ENCOUNTER — Other Ambulatory Visit: Payer: Self-pay

## 2020-03-17 ENCOUNTER — Ambulatory Visit (INDEPENDENT_AMBULATORY_CARE_PROVIDER_SITE_OTHER): Payer: Medicaid Other | Admitting: Adult Health

## 2020-03-17 VITALS — BP 127/83 | HR 81 | Temp 97.9°F | Resp 95 | Ht 70.0 in | Wt 173.4 lb

## 2020-03-17 DIAGNOSIS — R39198 Other difficulties with micturition: Secondary | ICD-10-CM | POA: Insufficient documentation

## 2020-03-17 DIAGNOSIS — K5900 Constipation, unspecified: Secondary | ICD-10-CM

## 2020-03-17 DIAGNOSIS — M7032 Other bursitis of elbow, left elbow: Secondary | ICD-10-CM

## 2020-03-17 DIAGNOSIS — R350 Frequency of micturition: Secondary | ICD-10-CM | POA: Diagnosis not present

## 2020-03-17 DIAGNOSIS — F209 Schizophrenia, unspecified: Secondary | ICD-10-CM

## 2020-03-17 DIAGNOSIS — R1011 Right upper quadrant pain: Secondary | ICD-10-CM | POA: Diagnosis not present

## 2020-03-17 DIAGNOSIS — Z8619 Personal history of other infectious and parasitic diseases: Secondary | ICD-10-CM | POA: Diagnosis not present

## 2020-03-17 LAB — POCT URINALYSIS DIPSTICK
Bilirubin, UA: NEGATIVE
Blood, UA: NEGATIVE
Glucose, UA: NEGATIVE
Ketones, UA: NEGATIVE
Leukocytes, UA: NEGATIVE
Nitrite, UA: NEGATIVE
Protein, UA: NEGATIVE
Spec Grav, UA: <=1.005 — AB (ref 1.010–1.025)
Urobilinogen, UA: 0.2 E.U./dL
pH, UA: 7 (ref 5.0–8.0)

## 2020-03-17 MED ORDER — POLYETHYLENE GLYCOL 3350 17 G PO PACK: 17.0000 g | PACK | Freq: Every day | ORAL | 0 refills | Status: DC | PRN

## 2020-03-17 MED ORDER — DOCUSATE SODIUM 100 MG PO CAPS: 100.0000 mg | ORAL_CAPSULE | Freq: Two times a day (BID) | ORAL | 1 refills | Status: DC

## 2020-03-17 MED ORDER — PREDNISONE 10 MG (21) PO TBPK: ORAL_TABLET | ORAL | 0 refills | Status: DC

## 2020-03-17 MED ORDER — POLYETHYLENE GLYCOL 3350 17 G PO PACK: 17.0000 g | PACK | Freq: Every day | ORAL | 1 refills | Status: DC

## 2020-03-17 MED ORDER — DICLOXACILLIN SODIUM 250 MG PO CAPS
250.0000 mg | ORAL_CAPSULE | Freq: Four times a day (QID) | ORAL | 0 refills | Status: AC
Start: 2020-03-17 — End: 2020-03-24

## 2020-03-17 NOTE — Patient Instructions (Signed)
Gastric doctor should call you within 2 weeks.  Come for labs next week fasting can black coffee/ or water ok and nothing to eat for 8 hours.   Health Maintenance, Male Adopting a healthy lifestyle and getting preventive care are important in promoting health and wellness. Ask your health care provider about:  The right schedule for you to have regular tests and exams.  Things you can do on your own to prevent diseases and keep yourself healthy. What should I know about diet, weight, and exercise? Eat a healthy diet  Eat a diet that includes plenty of vegetables, fruits, low-fat dairy products, and lean protein.  Do not eat a lot of foods that are high in solid fats, added sugars, or sodium.   Maintain a healthy weight Body mass index (BMI) is a measurement that can be used to identify possible weight problems. It estimates body fat based on height and weight. Your health care provider can help determine your BMI and help you achieve or maintain a healthy weight. Get regular exercise Get regular exercise. This is one of the most important things you can do for your health. Most adults should:  Exercise for at least 150 minutes each week. The exercise should increase your heart rate and make you sweat (moderate-intensity exercise).  Do strengthening exercises at least twice a week. This is in addition to the moderate-intensity exercise.  Spend less time sitting. Even light physical activity can be beneficial. Watch cholesterol and blood lipids Have your blood tested for lipids and cholesterol at 60 years of age, then have this test every 5 years. You may need to have your cholesterol levels checked more often if:  Your lipid or cholesterol levels are high.  You are older than 60 years of age.  You are at high risk for heart disease. What should I know about cancer screening? Many types of cancers can be detected early and may often be prevented. Depending on your health history  and family history, you may need to have cancer screening at various ages. This may include screening for:  Colorectal cancer.  Prostate cancer.  Skin cancer.  Lung cancer. What should I know about heart disease, diabetes, and high blood pressure? Blood pressure and heart disease  High blood pressure causes heart disease and increases the risk of stroke. This is more likely to develop in people who have high blood pressure readings, are of African descent, or are overweight.  Talk with your health care provider about your target blood pressure readings.  Have your blood pressure checked: ? Every 3-5 years if you are 15-41 years of age. ? Every year if you are 27 years old or older.  If you are between the ages of 71 and 56 and are a current or former smoker, ask your health care provider if you should have a one-time screening for abdominal aortic aneurysm (AAA). Diabetes Have regular diabetes screenings. This checks your fasting blood sugar level. Have the screening done:  Once every three years after age 81 if you are at a normal weight and have a low risk for diabetes.  More often and at a younger age if you are overweight or have a high risk for diabetes. What should I know about preventing infection? Hepatitis B If you have a higher risk for hepatitis B, you should be screened for this virus. Talk with your health care provider to find out if you are at risk for hepatitis B infection. Hepatitis C  Blood testing is recommended for:  Everyone born from [birth year] through 1965.  Anyone with known risk factors for hepatitis C. Sexually transmitted infections (STIs)  You should be screened each year for STIs, including gonorrhea and chlamydia, if: ? You are sexually active and are younger than 60 years of age. ? You are older than 60 years of age and your health care provider tells you that you are at risk for this type of infection. ? Your sexual activity has changed since you were  last screened, and you are at increased risk for chlamydia or gonorrhea. Ask your health care provider if you are at risk.  Ask your health care provider about whether you are at high risk for HIV. Your health care provider may recommend a prescription medicine to help prevent HIV infection. If you choose to take medicine to prevent HIV, you should first get tested for HIV. You should then be tested every 3 months for as long as you are taking the medicine. Follow these instructions at home: Lifestyle  Do not use any products that contain nicotine or tobacco, such as cigarettes, e-cigarettes, and chewing tobacco. If you need help quitting, ask your health care provider.  Do not use street drugs.  Do not share needles.  Ask your health care provider for help if you need support or information about quitting drugs. Alcohol use  Do not drink alcohol if your health care provider tells you not to drink.  If you drink alcohol: ? Limit how much you have to 0-2 drinks a day. ? Be aware of how much alcohol is in your drink. In the [place] one drink equals one 12 oz bottle of beer (355 mL), one 5 oz glass of wine (148 mL), or one 1 oz glass of hard liquor (44 mL). General instructions  Schedule regular health, dental, and eye exams.  Stay current with your vaccines.  Tell your health care provider if: ? You often feel depressed. ? You have ever been abused or do not feel safe at home. Summary  Adopting a healthy lifestyle and getting preventive care are important in promoting health and wellness.  Follow your health care provider's instructions about healthy diet, exercising, and getting tested or screened for diseases.  Follow your health care provider's instructions on monitoring your cholesterol and blood pressure. This information is not intended to replace advice given to you by your health care provider. Make sure you discuss any questions you have with your health care  provider. Document Revised: 01/07/2018 Document Reviewed: 01/07/2018 Elsevier Patient Education  2021 [place]. Constipation, Adult Constipation is when a person has trouble pooping (having a bowel movement). When you have this condition, you may poop fewer than 3 times a week. Your poop (stool) may also be dry, hard, or bigger than normal. Follow these instructions at home: Eating and drinking  Eat foods that have a lot of fiber, such as: ? Fresh fruits and vegetables. ? Whole grains. ? Beans.  Eat less of foods that are low in fiber and high in fat and sugar, such as: ? [place] fries. ? Hamburgers. ? Cookies. ? Candy. ? Soda.  Drink enough fluid to keep your pee (urine) pale yellow.   General instructions  Exercise regularly or as told by your doctor. Try to do 150 minutes of exercise each week.  Go to the restroom when you feel like you need to poop. Do not hold it in.  Take over-the-counter and prescription medicines only as  told by your doctor. These include any fiber supplements.  When you poop: ? Do deep breathing while relaxing your lower belly (abdomen). ? Relax your pelvic floor. The pelvic floor is a group of muscles that support the rectum, bladder, and intestines (as well as the uterus in women).  Watch your condition for any changes. Tell your doctor if you notice any.  Keep all follow-up visits as told by your doctor. This is important. Contact a doctor if:  You have pain that gets worse.  You have a fever.  You have not pooped for 4 days.  You vomit.  You are not hungry.  You lose weight.  You are bleeding from the opening of the butt (anus).  You have thin, pencil-like poop. Get help right away if:  You have a fever, and your symptoms suddenly get worse.  You leak poop or have blood in your poop.  Your belly feels hard or bigger than normal (bloated).  You have very bad belly pain.  You feel dizzy or you  faint. Summary  Constipation is when a person poops fewer than 3 times a week, has trouble pooping, or has poop that is dry, hard, or bigger than normal.  Eat foods that have a lot of fiber.  Drink enough fluid to keep your pee (urine) pale yellow.  Take over-the-counter and prescription medicines only as told by your doctor. These include any fiber supplements. This information is not intended to replace advice given to you by your health care provider. Make sure you discuss any questions you have with your health care provider. Document Revised: 12/02/2018 Document Reviewed: 12/02/2018 Elsevier Patient Education  [place].

## 2020-03-17 NOTE — Telephone Encounter (Signed)
Ebony Hail calling from [employer] calling to get clarity on the polyethylene glycol (MIRALAX / GLYCOLAX) 17 g packet [[id number]] . Sig states 1 packet. Is this just 1 month? Please advise CB- [phone]

## 2020-03-17 NOTE — Progress Notes (Signed)
New patient visit   Patient: Richard Vasquez   DOB: 1960/05/22   60 y.o. Male  MRN: [id number] Visit Date: 03/17/2020  Today's healthcare provider: Marcille Buffy, FNP   Chief Complaint  Patient presents with  . New Patient (Initial Visit)   Subjective    Richard Vasquez is a 60 y.o. male who presents today as a new patient to establish care.  HPI  Patient presents in office today to establish care, he states that he feels fairly well . Patient reports that 20 years ago he was diagnosed with Hepatitis C and would like to discuss medication options to treat. He was in prison and homeless after that and reports he was never treated. He is here today due to having Medicaid now.   Denies any drug use, no blood transfusions, has had multiple partners for sex in past.    Patient states that he would also like to get labs drawn today since he has ben on psych medication. He sees Dr. Erlene Quan at [facility] for psychiatry.   He has had increased urination/ frequency. Reports smaller stream at times. He has nocturia.  Denies any hematuria.   He has chronic constipation, he takes Metamucil, stool softness, and still has constipation. He does not get good results. He has bowel movement everyday more like every 5 days. Denies any blood or pain.  He does endorse RUQ pain intermittent at times.   He takes Pepto Bismuth for heartburn, he has cut back on BC powders like he was previously.   Patient reports that he follows a general diet, he is staying active by walking daily and states that sleep habits are fair.   Patient  denies any fever, body aches,chills, rash, chest pain, shortness of breath, nausea, vomiting, or diarrhea.   Denies dizziness, lightheadedness, pre syncopal or syncopal episodes.    Past Medical History:  Diagnosis Date  . Anxiety   . Arthritis   . BPH (benign prostatic hyperplasia)   . Chronic pain   . Closed fracture of distal end of right femur ([place]) 06/09/2018   . Depression   . Hepatitis C   . Paranoid schizophrenia ([place])   . Sleep apnea    Past Surgical History:  Procedure Laterality Date  . FRACTURE SURGERY    . HIP PINNING,CANNULATED Left 08/18/2018   Procedure: CANNULATED HIP PINNING, Right knee aspiration;  Surgeon: Thornton Park, MD;  Location: [facility];  Service: Orthopedics;  Laterality: Left;  . JOINT REPLACEMENT    . ORIF FEMUR FRACTURE Right 06/10/2018   Procedure: OPEN REDUCTION INTERNAL FIXATION (ORIF) DISTAL FEMUR FRACTURE;  Surgeon: Shona Needles, MD;  Location: [facility];  Service: Orthopedics;  Laterality: Right;   Family Status  Relation Name Status  . Mother  (Not Specified)  . Ethlyn Daniels  (Not Specified)  . Annamarie Major  (Not Specified)   Family History  Problem Relation Age of Onset  . Cancer Mother   . Diabetes Mother   . Diabetes Paternal Aunt   . Lung cancer Paternal Uncle    Social History   Socioeconomic History  . Marital status: Divorced    Spouse name: Not on file  . Number of children: Not on file  . Years of education: Not on file  . Highest education level: Not on file  Occupational History  . Not on file  Tobacco Use  . Smoking status: Current Some Day Smoker    Packs/day: 1.00    Years:  20.00    Pack years: 20.00    Types: Cigarettes  . Smokeless tobacco: Never Used  Vaping Use  . Vaping Use: Never used  Substance and Sexual Activity  . Alcohol use: Not Currently  . Drug use: Not Currently  . Sexual activity: Not on file  Other Topics Concern  . Not on file  Social History Narrative  . Not on file   Social Determinants of Health   Financial Resource Strain: Not on file  Food Insecurity: Not on file  Transportation Needs: Not on file  Physical Activity: Not on file  Stress: Not on file  Social Connections: Not on file   Outpatient Medications Prior to Visit  Medication Sig  . Omega-3 Fatty Acids (FISH OIL) 1000 MG CAPS Take by mouth.  . QUEtiapine (SEROQUEL) 200 MG tablet Take 1  tablet (200 mg total) by mouth at bedtime.  . traZODone (DESYREL) 100 MG tablet Take 1 tablet (100 mg total) by mouth at bedtime.  Marland Kitchen UNABLE TO FIND Med Name: liver cleanse support  . OLANZapine (ZYPREXA) 5 MG tablet Take 1 tablet (5 mg total) by mouth daily.  . [DISCONTINUED] docusate sodium (COLACE) 100 MG capsule Take 1 capsule (100 mg total) by mouth 2 (two) times daily. (Patient not taking: Reported on 11/04/2018)  . [DISCONTINUED] feeding supplement, ENSURE ENLIVE, (ENSURE ENLIVE) LIQD Take 237 mLs by mouth 2 (two) times daily between meals. (Patient not taking: Reported on 11/04/2018)  . [DISCONTINUED] gabapentin (NEURONTIN) 300 MG capsule Take 1 capsule (300 mg total) by mouth 3 (three) times daily.  . [DISCONTINUED] ibuprofen (ADVIL) 800 MG tablet Take 800 mg by mouth every 8 (eight) hours as needed. Patient takes one in the morning and one at night  . [DISCONTINUED] methocarbamol (ROBAXIN) 500 MG tablet Take 1 tablet (500 mg total) by mouth every 6 (six) hours as needed for muscle spasms.  . [DISCONTINUED] oxyCODONE (OXY IR/ROXICODONE) 5 MG immediate release tablet Take 1-2 tablets (5-10 mg total) by mouth every 4 (four) hours as needed for moderate pain (pain score 4-6). (Patient not taking: Reported on 11/04/2018)  . [DISCONTINUED] polyethylene glycol (MIRALAX / GLYCOLAX) 17 g packet Take 17 g by mouth daily. (Patient not taking: Reported on 11/04/2018)   No facility-administered medications prior to visit.   No Known Allergies  Immunization History  Administered Date(s) Administered  . Tdap 06/09/2018, 08/17/2018    Health Maintenance  Topic Date Due  . COLONOSCOPY (Pts 45-56yrs Insurance coverage will need to be confirmed)  Never done  . COVID-19 Vaccine (1) 04/02/2020 (Originally 11/04/1965)  . TETANUS/TDAP  08/16/2028  . Hepatitis C Screening  Completed  . HIV Screening  Completed  . INFLUENZA VACCINE  Discontinued    Patient Care Team: Phillipe Clemon, Kelby Aline, FNP as PCP -  General (Family Medicine)  Review of Systems  Constitutional: Positive for activity change and fatigue.  HENT: Positive for sinus pressure.   Gastrointestinal: Positive for constipation.  Genitourinary: Positive for difficulty urinating.  Musculoskeletal: Positive for arthralgias, back pain and neck stiffness.  Neurological: Positive for weakness, light-headedness and headaches.  Psychiatric/Behavioral: Positive for agitation, confusion, decreased concentration and sleep disturbance. The patient is nervous/anxious.   All other systems reviewed and are negative.     Objective    BP 127/83   Pulse 81   Temp 97.9 F (36.6 C) (Oral)   Resp (!) 95   Ht 5\' 10"  (1.778 m)   Wt 173 lb 6.4 oz (78.7 kg)   HC  16" (40.6 cm)   BMI 24.88 kg/m  Physical Exam Constitutional:      General: He is not in acute distress.    Appearance: Normal appearance. He is not ill-appearing, toxic-appearing or diaphoretic.  HENT:     Head: Normocephalic and atraumatic.     Right Ear: Tympanic membrane, ear canal and external ear normal. There is no impacted cerumen.     Left Ear: Tympanic membrane, ear canal and external ear normal. There is no impacted cerumen.     Nose: Nose normal. No congestion or rhinorrhea.     Mouth/Throat:     Mouth: Mucous membranes are moist.     Pharynx: No oropharyngeal exudate or posterior oropharyngeal erythema.  Eyes:     General: No scleral icterus.       Right eye: No discharge.        Left eye: No discharge.     Extraocular Movements: Extraocular movements intact.     Conjunctiva/sclera: Conjunctivae normal.     Pupils: Pupils are equal, round, and reactive to light.  Cardiovascular:     Rate and Rhythm: Normal rate and regular rhythm.     Pulses: Normal pulses.     Heart sounds: Normal heart sounds.  Pulmonary:     Effort: Pulmonary effort is normal.     Breath sounds: Normal breath sounds.  Abdominal:     General: Bowel sounds are normal. There is no  distension.     Palpations: Abdomen is soft. There is no mass.     Tenderness: There is no abdominal tenderness. There is no right CVA tenderness, left CVA tenderness, guarding or rebound.     Hernia: No hernia is present.  Genitourinary:    Comments: Declined.  Musculoskeletal:        General: Normal range of motion.     Cervical back: Normal range of motion and neck supple.  Skin:    General: Skin is warm.     Capillary Refill: Capillary refill takes less than 2 seconds.     Findings: No erythema or rash.  Neurological:     Mental Status: He is alert and oriented to person, place, and time.  Psychiatric:        Mood and Affect: Mood normal.        Behavior: Behavior normal.        Thought Content: Thought content normal.        Judgment: Judgment normal.     Depression Screen PHQ 2/9 Scores 03/17/2020 07/01/2018  PHQ - 2 Score 4 0  PHQ- 9 Score 20 -   Results for orders placed or performed in visit on 03/17/20  CBC with Differential/Platelet  Result Value Ref Range   WBC 10.4 3.4 - 10.8 x10E3/uL   RBC 4.27 4.14 - 5.80 x10E6/uL   Hemoglobin 14.2 13.0 - 17.7 g/dL   Hematocrit 40.2 37.5 - 51.0 %   MCV 94 79 - 97 fL   MCH 33.3 (H) 26.6 - 33.0 pg   MCHC 35.3 31.5 - 35.7 g/dL   RDW 11.1 (L) 11.6 - 15.4 %   Platelets 168 150 - 450 x10E3/uL   Neutrophils 86 Not Estab. %   Lymphs 11 Not Estab. %   Monocytes 1 Not Estab. %   Eos 1 Not Estab. %   Basos 1 Not Estab. %   Neutrophils Absolute 8.9 (H) 1.4 - 7.0 x10E3/uL   Lymphocytes Absolute 1.1 0.7 - 3.1 x10E3/uL   Monocytes Absolute 0.1 0.1 -  0.9 x10E3/uL   EOS (ABSOLUTE) 0.1 0.0 - 0.4 x10E3/uL   Basophils Absolute 0.1 0.0 - 0.2 x10E3/uL   Immature Granulocytes 0 Not Estab. %   Immature Grans (Abs) 0.0 0.0 - 0.1 x10E3/uL  Comprehensive metabolic panel  Result Value Ref Range   Glucose 139 (H) 65 - 99 mg/dL   BUN 9 6 - 24 mg/dL   Creatinine, Ser 0.70 (L) 0.76 - 1.27 mg/dL   GFR calc non Af Amer 103 >59 mL/min/1.73   GFR  calc Af Amer 119 >59 mL/min/1.73   BUN/Creatinine Ratio 13 9 - 20   Sodium 137 134 - 144 mmol/L   Potassium 4.0 3.5 - 5.2 mmol/L   Chloride 101 96 - 106 mmol/L   CO2 19 (L) 20 - 29 mmol/L   Calcium 9.7 8.7 - 10.2 mg/dL   Total Protein 7.1 6.0 - 8.5 g/dL   Albumin 4.3 3.8 - 4.9 g/dL   Globulin, Total 2.8 1.5 - 4.5 g/dL   Albumin/Globulin Ratio 1.5 1.2 - 2.2   Bilirubin Total 0.2 0.0 - 1.2 mg/dL   Alkaline Phosphatase 74 44 - 121 IU/L   AST 30 0 - 40 IU/L   ALT 27 0 - 44 IU/L  Lipid panel  Result Value Ref Range   Cholesterol, Total 179 100 - 199 mg/dL   Triglycerides 109 0 - 149 mg/dL   HDL 34 (L) >39 mg/dL   VLDL Cholesterol Cal 20 5 - 40 mg/dL   LDL Chol Calc (NIH) 125 (H) 0 - 99 mg/dL   Chol/HDL Ratio 5.3 (H) 0.0 - 5.0 ratio  TSH  Result Value Ref Range   TSH 1.590 0.450 - 4.500 uIU/mL  PSA  Result Value Ref Range   Prostate Specific Ag, Serum 0.8 0.0 - 4.0 ng/mL  Hepatitis panel, acute  Result Value Ref Range   Hep A IgM Negative Negative   Hepatitis B Surface Ag Negative Negative   Hep B C IgM Negative Negative   Hep C Virus Ab >11.0 (H) 0.0 - 0.9 s/co ratio  HCV Ab w/Rflx to Verification  Result Value Ref Range   HCV Ab WILL FOLLOW    HCV RNA NAA QUALITATIVE WILL FOLLOW   Lipase  Result Value Ref Range   Lipase 25 13 - 78 U/L  Amylase  Result Value Ref Range   Amylase 53 31 - 110 U/L  Uric acid  Result Value Ref Range   Uric Acid 3.0 (L) 3.8 - 8.4 mg/dL  POCT urinalysis dipstick  Result Value Ref Range   Color, UA yellow    Clarity, UA clear    Glucose, UA Negative Negative   Bilirubin, UA negative    Ketones, UA negative    Spec Grav, UA <=1.005 (A) 1.010 - 1.025   Blood, UA negative    pH, UA 7.0 5.0 - 8.0   Protein, UA Negative Negative   Urobilinogen, UA 0.2 0.2 or 1.0 E.U./dL   Nitrite, UA negative    Leukocytes, UA Negative Negative   Appearance     Odor      Assessment & Plan       1. Frequent urination caffeine decreased discussed.  Red flags discussed if persists may need urology/ denies hematuria or dysuria.  - POCT urinalysis dipstick - PSA  2. RUQ pain  - HCV Ab w/Rflx to Verification - US Abdomen Limited RUQ (LIVER/GB) - Ambulatory referral to Gastroenterology - Lipase - Amylase  3. History of hepatitis C  -  Hepatitis panel, acute - HCV Ab w/Rflx to Verification - US Abdomen Limited RUQ (LIVER/GB) - Ambulatory referral to Gastroenterology  4. Schizophrenia, unspecified type ([place]) Stable reported and doing well.seeing Dr. Weber Cooks.  Orders Placed This Encounter  Procedures  . US Abdomen Limited RUQ (LIVER/GB)  . DG Abd 1 View  . CBC with Differential/Platelet  . Comprehensive metabolic panel  . Lipid panel  . TSH  . PSA  . Hepatitis panel, acute  . HCV Ab w/Rflx to Verification  . Lipase  . Amylase  . Uric acid  . Uric acid  . Ambulatory referral to Gastroenterology  . Ambulatory referral to Orthopedic Surgery  . POCT urinalysis dipstick   5. Constipation, unspecified constipation type dietary measures discussed.  - CBC with Differential/Platelet - Comprehensive metabolic panel - Lipid panel - TSH - DG Abd 1 View - Ambulatory referral to Gastroenterology - docusate sodium (COLACE) 100 MG capsule; Take 1 capsule (100 mg total) by mouth 2 (two) times daily.  Dispense: 60 capsule; Refill: 1 - polyethylene glycol (MIRALAX / GLYCOLAX) 17 g packet; Take 17 g by mouth daily as needed for moderate constipation or severe constipation.  Dispense: 1 packet; Refill: 0  6. Bursitis of left elbow, unspecified bursa - Uric acid - predniSONE (STERAPRED UNI-PAK 21 TAB) 10 MG (21) TBPK tablet; PO: Take 6 tablets on day 1:Take 5 tablets day 2:Take 4 tablets day 3: Take 3 tablets day 4:Take 2 tablets day five: 5 Take 1 tablet day 6  Dispense: 21 tablet; Refill: 0 - dicloxacillin (DYNAPEN) 250 MG capsule; Take 1 capsule (250 mg total) by mouth 4 (four) times daily for 7 days.  Dispense: 28 capsule; Refill:  0 - Ambulatory referral to Orthopedic Surgery  Return in about 1 week (around 03/24/2020), or if symptoms worsen or fail to improve, for at any time for any worsening symptoms, Go to Emergency room/ urgent care if worse.        Marcille Buffy, [place] [phone] (phone) [phone] (fax)  [place]

## 2020-03-20 NOTE — Telephone Encounter (Signed)
Miralax should read one capful or packet po qd only as needed  for constipation. May take 1-3 days for bowel movement to occur. Dispense one with one refill.

## 2020-03-21 NOTE — Telephone Encounter (Signed)
Spoke with pharmacist and clarified as below. kw

## 2020-03-26 LAB — CBC WITH DIFFERENTIAL/PLATELET
Basophils Absolute: 0.1 10*3/uL (ref 0.0–0.2)
Basos: 1 %
EOS (ABSOLUTE): 0.1 10*3/uL (ref 0.0–0.4)
Eos: 1 %
Hematocrit: 40.2 % (ref 37.5–51.0)
Hemoglobin: 14.2 g/dL (ref 13.0–17.7)
Immature Grans (Abs): 0 10*3/uL (ref 0.0–0.1)
Immature Granulocytes: 0 %
Lymphocytes Absolute: 1.1 10*3/uL (ref 0.7–3.1)
Lymphs: 11 %
MCH: 33.3 pg — ABNORMAL HIGH (ref 26.6–33.0)
MCHC: 35.3 g/dL (ref 31.5–35.7)
MCV: 94 fL (ref 79–97)
Monocytes Absolute: 0.1 10*3/uL (ref 0.1–0.9)
Monocytes: 1 %
Neutrophils Absolute: 8.9 10*3/uL — ABNORMAL HIGH (ref 1.4–7.0)
Neutrophils: 86 %
Platelets: 168 10*3/uL (ref 150–450)
RBC: 4.27 x10E6/uL (ref 4.14–5.80)
RDW: 11.1 % — ABNORMAL LOW (ref 11.6–15.4)
WBC: 10.4 10*3/uL (ref 3.4–10.8)

## 2020-03-26 LAB — HCV AB W/RFLX TO VERIFICATION: HCV Ab: >11 s/co ratio — ABNORMAL HIGH (ref 0.0–0.9)

## 2020-03-26 LAB — COMPREHENSIVE METABOLIC PANEL
ALT: 27 IU/L (ref 0–44)
AST: 30 IU/L (ref 0–40)
Albumin/Globulin Ratio: 1.5 (ref 1.2–2.2)
Albumin: 4.3 g/dL (ref 3.8–4.9)
Alkaline Phosphatase: 74 IU/L (ref 44–121)
BUN/Creatinine Ratio: 13 (ref 9–20)
BUN: 9 mg/dL (ref 6–24)
Bilirubin Total: 0.2 mg/dL (ref 0.0–1.2)
CO2: 19 mmol/L — ABNORMAL LOW (ref 20–29)
Calcium: 9.7 mg/dL (ref 8.7–10.2)
Chloride: 101 mmol/L (ref 96–106)
Creatinine, Ser: 0.7 mg/dL — ABNORMAL LOW (ref 0.76–1.27)
GFR calc Af Amer: 119 mL/min/{1.73_m2} (ref >59)
GFR calc non Af Amer: 103 mL/min/{1.73_m2} (ref >59)
Globulin, Total: 2.8 g/dL (ref 1.5–4.5)
Glucose: 139 mg/dL — ABNORMAL HIGH (ref 65–99)
Potassium: 4 mmol/L (ref 3.5–5.2)
Sodium: 137 mmol/L (ref 134–144)
Total Protein: 7.1 g/dL (ref 6.0–8.5)

## 2020-03-26 LAB — LIPID PANEL
Chol/HDL Ratio: 5.3 ratio — ABNORMAL HIGH (ref 0.0–5.0)
Cholesterol, Total: 179 mg/dL (ref 100–199)
HDL: 34 mg/dL — ABNORMAL LOW (ref >39)
LDL Chol Calc (NIH): 125 mg/dL — ABNORMAL HIGH (ref 0–99)
Triglycerides: 109 mg/dL (ref 0–149)
VLDL Cholesterol Cal: 20 mg/dL (ref 5–40)

## 2020-03-26 LAB — URIC ACID: Uric Acid: 3 mg/dL — ABNORMAL LOW (ref 3.8–8.4)

## 2020-03-26 LAB — HEPATITIS PANEL, ACUTE
Hep A IgM: NEGATIVE
Hep B C IgM: NEGATIVE
Hep C Virus Ab: >11 s/co ratio — ABNORMAL HIGH (ref 0.0–0.9)
Hepatitis B Surface Ag: NEGATIVE

## 2020-03-26 LAB — HCV RNA NAA QUALITATIVE: HCV RNA NAA QUALITATIVE: POSITIVE — AB

## 2020-03-26 LAB — AMYLASE: Amylase: 53 U/L (ref 31–110)

## 2020-03-26 LAB — PSA: Prostate Specific Ag, Serum: 0.8 ng/mL (ref 0.0–4.0)

## 2020-03-26 LAB — LIPASE: Lipase: 25 U/L (ref 13–78)

## 2020-03-26 LAB — TSH: TSH: 1.59 u[IU]/mL (ref 0.450–4.500)

## 2020-03-26 LAB — INTERPRETATION:

## 2020-03-28 NOTE — Progress Notes (Signed)
CBC is ok, improved from one year ago mild irregularities.  CMP with elevated glucose. Needs add on hemoglobin A 1C or come in for it to lab.   LDL elevated.  HDL good cholesterol is low. Discuss lifestyle modification with patient e.g. increase exercise, fiber, fruits, vegetables, lean meat, and omega 3/fish intake and decrease saturated fat.  If patient following strict diet and exercise program already please schedule follow up appointment with primary care physician   TSH is within normal limits for thyroid. PSA for prostate within normal limits.  Hep C antibody is positive as well as lab for acute infection has been referred to gastrointestinal MD be sure he has received call.  Ref Range & Units 8 d ago  HCV RNA NAA QUALITATIVE PositiveAbnormal

## 2020-03-29 ENCOUNTER — Other Ambulatory Visit: Payer: Self-pay

## 2020-03-29 ENCOUNTER — Ambulatory Visit
Admission: RE | Admit: 2020-03-29 | Discharge: 2020-03-29 | Disposition: A | Discharge status: Home / Self Care | Payer: Medicaid Other | Source: Ambulatory Visit | Attending: Adult Health | Admitting: Adult Health

## 2020-03-29 ENCOUNTER — Telehealth: Payer: Self-pay

## 2020-03-29 DIAGNOSIS — R7309 Other abnormal glucose: Secondary | ICD-10-CM

## 2020-03-29 DIAGNOSIS — R1011 Right upper quadrant pain: Secondary | ICD-10-CM | POA: Insufficient documentation

## 2020-03-29 DIAGNOSIS — Z8619 Personal history of other infectious and parasitic diseases: Secondary | ICD-10-CM | POA: Diagnosis present

## 2020-03-29 NOTE — Telephone Encounter (Signed)
-----   Message from Doreen Beam, [place] sent at 03/28/2020  5:02 PM EST ----- CBC is ok, improved from one year ago mild irregularities.  CMP with elevated glucose. Needs add on hemoglobin A 1C or come in for it to lab.   LDL elevated.  HDL good cholesterol is low. Discuss lifestyle modification with patient e.g. increase exercise, fiber, fruits, vegetables, lean meat, and omega 3/fish intake and decrease saturated fat.  If patient following strict diet and exercise program already please schedule follow up appointment with primary care physician   TSH is within normal limits for thyroid. PSA for prostate within normal limits.  Hep C antibody is positive as well as lab for acute infection has been referred to gastrointestinal MD be sure he has received call.  Ref Range & Units 8 d ago  HCV RNA NAA QUALITATIVE PositiveAbnormal

## 2020-03-29 NOTE — Telephone Encounter (Signed)
No notes in referral section but referral to GI has been placed and he should go asap.

## 2020-03-29 NOTE — Telephone Encounter (Signed)
Richard Roch do you have an update on the GI referral for this patient?

## 2020-03-30 NOTE — Telephone Encounter (Signed)
He has an appointment with Dr Allen Norris 05/09/20

## 2020-03-30 NOTE — Telephone Encounter (Signed)
Noted he is scheduled with GI.

## 2020-03-30 NOTE — Progress Notes (Signed)
IMPRESSION: Hepatic steatosis. Please note limited evaluation for focal hepatic masses in a patient with hepatic steatosis due to decreased penetration of the acoustic ultrasound waves. Consider MRI liver protocol for further evaluation  Liver ultrasound, he has been refered to gastroenterology will have them do MRI if warranted. He does have fatty liver. .If he has not heard from gastroenterology within 2 weeks call us.

## 2020-03-31 LAB — HEMOGLOBIN A1C
Est. average glucose Bld gHb Est-mCnc: 111 mg/dL
Hgb A1c MFr Bld: 5.5 % (ref 4.8–5.6)

## 2020-03-31 NOTE — Telephone Encounter (Signed)
Hemoglobin A1C is within normal limits. Non diabetic.

## 2020-04-03 ENCOUNTER — Telehealth: Payer: Self-pay

## 2020-04-03 NOTE — Telephone Encounter (Signed)
Patient has been advised. KW 

## 2020-04-03 NOTE — Telephone Encounter (Signed)
-----   Message from Doreen Beam, [place] sent at 03/30/2020  5:08 PM EST ----- IMPRESSION: Hepatic steatosis. Please note limited evaluation for focal hepatic masses in a patient with hepatic steatosis due to decreased penetration of the acoustic ultrasound waves. Consider MRI liver protocol for further evaluation  Liver ultrasound, he has been refered to gastroenterology will have them do MRI if warranted. He does have fatty liver. .If he has not heard from gastroenterology within 2 weeks call us.

## 2020-04-14 ENCOUNTER — Telehealth: Payer: Self-pay

## 2020-04-14 ENCOUNTER — Ambulatory Visit: Payer: Self-pay | Admitting: Adult Health

## 2020-04-14 NOTE — Telephone Encounter (Signed)
Patient contacted office to get an update in regards to his gastroenterology referral that was placed on 03/17/20, I looked in the chart but didn't see that this patient had been contacted, can someone reach back out to patient to follow up? KW

## 2020-04-19 ENCOUNTER — Encounter: Payer: Self-pay | Admitting: Adult Health

## 2020-04-19 ENCOUNTER — Ambulatory Visit (INDEPENDENT_AMBULATORY_CARE_PROVIDER_SITE_OTHER): Payer: Medicaid Other | Admitting: Adult Health

## 2020-04-19 ENCOUNTER — Other Ambulatory Visit: Payer: Self-pay

## 2020-04-19 VITALS — BP 112/78 | HR 82 | Temp 98.0°F | Resp 16 | Wt 172.2 lb

## 2020-04-19 DIAGNOSIS — Z8619 Personal history of other infectious and parasitic diseases: Secondary | ICD-10-CM

## 2020-04-19 DIAGNOSIS — K732 Chronic active hepatitis, not elsewhere classified: Secondary | ICD-10-CM | POA: Diagnosis not present

## 2020-04-19 DIAGNOSIS — R1011 Right upper quadrant pain: Secondary | ICD-10-CM | POA: Diagnosis not present

## 2020-04-19 NOTE — Addendum Note (Signed)
Addended by: Doreen Beam on: 04/19/2020 02:49 PM   Modules accepted: Level of Service

## 2020-04-19 NOTE — Patient Instructions (Addendum)
05/09/2020 2:45 PM [name], MD [facility] [facility] - [facility] [address] [place], [place] [address] [phone] Locate on Map   Hepatitis C  Hepatitis C is a liver infection that is caused by a virus. Many people have no symptoms or only mild symptoms. Hepatitis C is contagious. This means that it can spread from person to person. Over time, the infection can lead to serious liver problems. What are the causes? This condition is caused by a virus. People can get this infection through:  Contact with certain body fluids of someone who has the virus. This includes: ? Blood. ? Semen or vaginal fluids.  Childbirth. A woman can pass the virus to her baby during birth.  Blood or organ donations done in the [place] before 1992. What increases the risk? The following things may make you more likely to get this condition:  Having contact with needles or syringes that have the virus on them. This may happen while: ? Getting a treatment that involves putting thin needles through your skin (acupuncture). ? Getting a tattoo. ? Getting a body piercing. ? Injecting drugs.  Having sex with someone who has the virus. The virus can be passed through vaginal, oral, or anal sex.  Getting treatment to filter your blood (kidney dialysis).  Having HIV or AIDS.  Having a job that involves contact with blood or certain other body fluids, such as in health care. What are the signs or symptoms? Symptoms of this condition include:  Tiredness (fatigue).  Not wanting to eat as much as normal (loss of appetite).  Feeling like you may vomit (nauseous).  Vomiting.  Pain in your belly (abdomen).  Dark yellow pee (urine).  Your skin or the white parts of your eyes turning yellow (jaundice).  Itchy skin.  Light-colored or tan poop (stool).  Joint pain.  Bleeding and bruising that happen often.  Fluid building up in your stomach  (ascites). Often, there are no symptoms. How is this treated? Treatment may depend on how bad your condition is, how long it has lasted, and whether you have liver damage. More testing may be done to figure out the best treatment. Treatment may include:  Taking antiviral medicines and other medicines.  Having follow-up treatments every 6-12 months for infections or other liver problems.  Getting a new liver from a donor (liver transplant). Follow these instructions at home: Medicines  Take over-the-counter and prescription medicines only as told by your doctor.  If you were prescribed an antiviral medicine, take it as told by your doctor. Do not stop using the antiviral even if you start to feel better.  Do not take any new medicines unless your doctor says that this is okay. This includes over-the-counter medicines and supplements. Activity  Rest as needed.  Do not have sex until your doctor says this is okay.  Return to your normal activities as told by your doctor. Ask your doctor what activities are safe for you.  Ask your doctor when you may go back to school or work. Eating and drinking  Eat a balanced diet. Eat plenty of: ? Fruits and vegetables. ? Whole grains. ? Low-fat (lean) meats or non-meat proteins, such as beans or tofu.  Drink enough fluids to keep your pee pale yellow.  Do not drink alcohol.   General instructions  Do not share toothbrushes, nail clippers, or razors.  Wash your hands often with soap and water for at least  20 seconds. If you do not have soap and water, use hand sanitizer.  Cover any cuts or open sores on your skin.  Avoid swimming or using hot tubs if you have open sores or wounds.  Keep all follow-up visits as told by your doctor. This is important. You may need follow-up visits every 6-12 months. How is this prevented?  Wash your hands often with soap and water for at least 20 seconds.  Do not share needles or syringes.  Use a  condom every time you have vaginal, oral, or anal sex. Be sure to use it the right way each time. ? Both females and males should wear condoms. ? Condoms should be kept in place from the start to the end of sexual activity. ? Latex condoms should be used, if possible.  Do not handle blood or body fluids without gloves or other protection.  Avoid getting tattoos or piercings in places that are not clean. Where to find more information  Centers for Disease Control and Prevention: http://www.wolf.info/ Contact a doctor if:  You have a fever.  You have belly pain.  Your pee is dark.  Your poop is a light color or tan.  You have joint pain. Get help right away if:  You feel more and more tired.  You feel more and more weak.  You do not feel like eating.  You cannot eat or drink without vomiting.  Your skin or the whites of your eyes turn yellow or turn more yellow than they were before.  You bruise or bleed easily. Summary  Hepatitis C is a liver infection that is caused by a virus.  Over time, the infection can lead to serious liver problems.  The virus can be spread from person to person through blood, semen, or vaginal fluids.  Do not take any new medicines unless your doctor says that this is okay. This information is not intended to replace advice given to you by your health care provider. Make sure you discuss any questions you have with your health care provider. Document Revised: 10/07/2018 Document Reviewed: 09/14/2018 Elsevier Patient Education  2021 [employer].  Fatty Liver Disease  The liver converts food into energy, removes toxic material from the blood, makes important proteins, and absorbs necessary vitamins from food. Fatty liver disease occurs when too much fat has built up in your liver cells. Fatty liver disease is also called hepatic steatosis. In many cases, fatty liver disease does not cause symptoms or problems. It is often diagnosed when tests are being  done for other reasons. However, over time, fatty liver can cause inflammation that may lead to more serious liver problems, such as scarring of the liver (cirrhosis) and liver failure. Fatty liver is associated with insulin resistance, increased body fat, high blood pressure (hypertension), and high cholesterol. These are features of metabolic syndrome and increase your risk for stroke, diabetes, and heart disease. What are the causes? This condition may be caused by components of metabolic syndrome:  Obesity.  Insulin resistance.  High cholesterol. Other causes:  Alcohol abuse.  Poor nutrition.  Cushing syndrome.  Pregnancy.  Certain drugs.  Poisons.  Some viral infections. What increases the risk? You are more likely to develop this condition if you:  Abuse alcohol.  Are overweight.  Have diabetes.  Have hepatitis.  Have a high triglyceride level.  Are pregnant. What are the signs or symptoms? Fatty liver disease often does not cause symptoms. If symptoms do develop, they can include:  Fatigue and weakness.  Weight loss.  Confusion.  Nausea, vomiting, or abdominal pain.  Yellowing of your skin and the white parts of your eyes (jaundice).  Itchy skin. How is this diagnosed? This condition may be diagnosed by:  A physical exam and your medical history.  Blood tests.  Imaging tests, such as an ultrasound, CT scan, or MRI.  A liver biopsy. A small sample of liver tissue is removed using a needle. The sample is then looked at under a microscope. How is this treated? Fatty liver disease is often caused by other health conditions. Treatment for fatty liver may involve medicines and lifestyle changes to manage conditions such as:  Alcoholism.  High cholesterol.  Diabetes.  Being overweight or obese. Follow these instructions at home:  Do not drink alcohol. If you have trouble quitting, ask your health care provider how to safely quit with the help  of medicine or a supervised program. This is important to keep your condition from getting worse.  Eat a healthy diet as told by your health care provider. Ask your health care provider about working with a dietitian to develop an eating plan.  Exercise regularly. This can help you lose weight and control your cholesterol and diabetes. Talk to your health care provider about an exercise plan and which activities are best for you.  Take over-the-counter and prescription medicines only as told by your health care provider.  Keep all follow-up visits. This is important.   Contact a health care provider if:  You have trouble controlling your: ? Blood sugar. This is especially important if you have diabetes. ? Cholesterol. ? Drinking of alcohol. Get help right away if:  You have abdominal pain.  You have jaundice.  You have nausea and are vomiting.  You vomit blood or material that looks like coffee grounds.  You have stools that are black, tar-like, or bloody. Summary  Fatty liver disease develops when too much fat builds up in the cells of your liver.  Fatty liver disease often causes no symptoms or problems. However, over time, fatty liver can cause inflammation that may lead to more serious liver problems, such as scarring of the liver (cirrhosis).  You are more likely to develop this condition if you abuse alcohol, are pregnant, are overweight, have diabetes, have hepatitis, or have high triglyceride or cholesterol levels.  Contact your health care provider if you have trouble controlling your blood sugar, cholesterol, or drinking of alcohol. This information is not intended to replace advice given to you by your health care provider. Make sure you discuss any questions you have with your health care provider. Document Revised: 10/28/2019 Document Reviewed: 10/28/2019 Elsevier Patient Education  [place].

## 2020-04-19 NOTE — Progress Notes (Addendum)
Established patient visit   Patient: Richard Vasquez   DOB: Jun 23, 1960   60 y.o. Male  MRN: [id number] Visit Date: 04/19/2020  Today's healthcare provider: Marcille Buffy, FNP   Chief Complaint  Patient presents with  . Follow-up  . Hernia    Patient would like to address abdominal hernia, he states in the past week abdomen has been sore when bending or certain position changes.    Subjective    HPI HPI    Hernia     Additional comments: Patient would like to address abdominal hernia, he states in the past week abdomen has been sore when bending or certain position changes.        Last edited by Minette Headland, CMA on 04/19/2020  1:47 PM. (History)      Follow up for bursitis of left elbow  The patient was last seen for this 1 months ago. Symptoms are resolved.  Changes made at last visit include patient was started on Prednisone 10mg  and Dicloxacillin 250mg   And referral was placed for orthopedic surgery.  He reports excellent compliance with treatment. He feels that condition is Improved. He is not having side effects.    He is scheduled with Dr. Allen Norris. He has a known history of Hepatitis C  that was not  Treated due to no insurance.and did not seek care labs are still showing active hepatitis C.   Final [99]    PACS Intelerad Image Link  Show images for US Abdomen Limited RUQ (LIVER/GB)  Study Result  Narrative & Impression  CLINICAL DATA:  Right upper quadrant pain, history of hepatitis CX 4 months.  EXAM: ULTRASOUND ABDOMEN LIMITED RIGHT UPPER QUADRANT  COMPARISON:  CT abdomen pelvis 08/17/2018  FINDINGS: Gallbladder:  No gallstones or wall thickening visualized. No sonographic Murphy sign noted by sonographer.  Common bile duct:  Diameter: 5 mm.  Liver:  No focal lesion identified. Increased parenchymal echogenicity. Portal vein is patent on color Doppler imaging with normal direction of blood flow towards the  liver.  Other: None.  IMPRESSION: Hepatic steatosis. Please note limited evaluation for focal hepatic masses in a patient with hepatic steatosis due to decreased penetration of the acoustic ultrasound waves. Consider MRI liver protocol for further evaluation.   Electronically Signed   By: Iven Finn M.D.    Patient  denies any fever, body aches,chills, rash, chest pain, shortness of breath, nausea, vomiting, or diarrhea.   Denies dizziness, lightheadedness, pre syncopal or syncopal episodes.   -----------------------------------------------------------------------------------------      Medications: Outpatient Medications Prior to Visit  Medication Sig  . docusate sodium (COLACE) 100 MG capsule Take 1 capsule (100 mg total) by mouth 2 (two) times daily.  . Omega-3 Fatty Acids (FISH OIL) 1000 MG CAPS Take by mouth.  . polyethylene glycol (MIRALAX / GLYCOLAX) 17 g packet Take 17 g by mouth daily as needed for moderate constipation or severe constipation.  . QUEtiapine (SEROQUEL) 200 MG tablet Take 1 tablet (200 mg total) by mouth at bedtime.  . traZODone (DESYREL) 100 MG tablet Take 1 tablet (100 mg total) by mouth at bedtime.  Marland Kitchen UNABLE TO FIND Med Name: liver cleanse support  . OLANZapine (ZYPREXA) 5 MG tablet Take 1 tablet (5 mg total) by mouth daily.  . [DISCONTINUED] predniSONE (STERAPRED UNI-PAK 21 TAB) 10 MG (21) TBPK tablet PO: Take 6 tablets on day 1:Take 5 tablets day 2:Take 4 tablets day 3: Take 3 tablets day 4:Take 2  tablets day five: 5 Take 1 tablet day 6   No facility-administered medications prior to visit.    Review of Systems  Constitutional: Negative.   HENT: Negative.   Respiratory: Negative.   Cardiovascular: Negative.   Gastrointestinal: Negative.   Genitourinary: Negative.   Musculoskeletal: Negative.   Neurological: Negative.   Psychiatric/Behavioral: Negative.        Objective    BP 112/78   Pulse 82   Temp 98 F (36.7 C) (Oral)    Resp 16   Wt 172 lb 3.2 oz (78.1 kg)   SpO2 97%   BMI 24.71 kg/m  BP Readings from Last 3 Encounters:  04/19/20 112/78  03/17/20 127/83  08/31/18 110/71   Wt Readings from Last 3 Encounters:  04/19/20 172 lb 3.2 oz (78.1 kg)  03/17/20 173 lb 6.4 oz (78.7 kg)  08/21/18 147 lb 0.8 oz (66.7 kg)       Physical Exam Vitals and nursing note reviewed.  Constitutional:      General: He is not in acute distress.    Appearance: Normal appearance. He is well-developed. He is not ill-appearing, toxic-appearing or diaphoretic.     Comments: Patient is alert and oriented and responsive to questions Engages in eye contact with provider. Speaks in full sentences without any pauses without any shortness of breath or distress.    HENT:     Head: Normocephalic and atraumatic.     Right Ear: Hearing, tympanic membrane, ear canal and external ear normal.     Left Ear: Hearing, tympanic membrane, ear canal and external ear normal.     Nose: Nose normal.     Mouth/Throat:     Pharynx: Uvula midline. No oropharyngeal exudate.  Eyes:     General: Lids are normal. No scleral icterus.       Right eye: No discharge.        Left eye: No discharge.     Conjunctiva/sclera: Conjunctivae normal.     Pupils: Pupils are equal, round, and reactive to light.  Neck:     Thyroid: No thyromegaly.     Vascular: Normal carotid pulses. No carotid bruit, hepatojugular reflux or JVD.     Trachea: Trachea and phonation normal. No tracheal tenderness or tracheal deviation.     Meningeal: Brudzinski's sign absent.  Cardiovascular:     Rate and Rhythm: Normal rate and regular rhythm.     Pulses: Normal pulses.     Heart sounds: Normal heart sounds, S1 normal and S2 normal. Heart sounds not distant. No murmur heard. No friction rub. No gallop.   Pulmonary:     Effort: Pulmonary effort is normal. No accessory muscle usage or respiratory distress.     Breath sounds: Normal breath sounds. No stridor. No wheezing,  rhonchi or rales.  Chest:     Chest wall: No tenderness.  Abdominal:     General: Bowel sounds are normal. There is no distension.     Palpations: Abdomen is soft. There is no mass.     Tenderness: There is no abdominal tenderness. There is no guarding or rebound.     Hernia: No hernia is present.  Musculoskeletal:        General: No tenderness or deformity. Normal range of motion.     Cervical back: Full passive range of motion without pain, normal range of motion and neck supple.     Comments: Patient moves on and off of exam table and in room without difficulty. Gait is normal  in hall and in room. Patient is oriented to person place time and situation. Patient answers questions appropriately and engages in conversation.   Lymphadenopathy:     Head:     Right side of head: No submental, submandibular, tonsillar, preauricular, posterior auricular or occipital adenopathy.     Left side of head: No submental, submandibular, tonsillar, preauricular, posterior auricular or occipital adenopathy.     Cervical: No cervical adenopathy.  Skin:    General: Skin is warm and dry.     Capillary Refill: Capillary refill takes less than 2 seconds.     Coloration: Skin is not pale.     Findings: No erythema or rash.     Nails: There is no clubbing.  Neurological:     Mental Status: He is alert and oriented to person, place, and time.     GCS: GCS eye subscore is 4. GCS verbal subscore is 5. GCS motor subscore is 6.     Cranial Nerves: No cranial nerve deficit.     Sensory: No sensory deficit.     Motor: No weakness or abnormal muscle tone.     Coordination: Coordination normal.     Gait: Gait normal.     Deep Tendon Reflexes: Reflexes are normal and symmetric. Reflexes normal.  Psychiatric:        Mood and Affect: Mood normal.        Speech: Speech normal.        Behavior: Behavior normal.        Thought Content: Thought content normal.        Judgment: Judgment normal.      No results  found for any visits on 04/19/20.  Assessment & Plan    Chronic active hepatitis ([place]) - Plan: MR Abdomen W Wo Contrast, Hepatitis B Core Antibody, total, Hepatitis A Ab, Total, CANCELED: MR Abdomen W Wo Contrast  RUQ pain - Plan: CBC with Differential/Platelet, MR Abdomen W Wo Contrast, CANCELED: MR Abdomen W Wo Contrast  History of hepatitis C  Orders Placed This Encounter  Procedures  . MR Abdomen W Wo Contrast    See ultrasound, see please do liver protocol    Order Specific Question:   If indicated for the ordered procedure, I authorize the administration of contrast media per Radiology protocol    Answer:   Yes    Order Specific Question:   What is the patient's sedation requirement?    Answer:   No Sedation    Order Specific Question:   Does the patient have a pacemaker or implanted devices?    Answer:   Yes    Order Specific Question:   Manufacturer of pacemake or implanted device?    Answer:   no pacemaker, metal rods in right and left leg and hip.    Order Specific Question:   Preferred imaging location?    Answer:   [facility] (table limit-350lbs)  . CBC with Differential/Platelet  . Hepatitis B Core Antibody, total  . Hepatitis A Ab, Total   Keep appointment with Dr. Allen Norris gastroenterology for hepatitis C and need for overdue colonoscopy.   Red Flags discussed. The patient was given clear instructions to go to ER or return to medical center if any red flags develop, symptoms do not improve, worsen or new problems develop. They verbalized understanding.   Return if symptoms worsen or fail to improve, for at any time for any worsening symptoms, Go to Emergency room/ urgent care if worse.  The entirety of the information documented in the History of Present Illness, Review of Systems and Physical Exam were personally obtained by me. Portions of this information were initially documented by the CMA and reviewed by me for thoroughness and accuracy.     Marcille Buffy, [place] [phone] (phone) [phone] (fax)  [place]

## 2020-04-20 ENCOUNTER — Ambulatory Visit
Admission: RE | Admit: 2020-04-20 | Discharge: 2020-04-20 | Disposition: A | Discharge status: Home / Self Care | Payer: Medicaid Other | Source: Ambulatory Visit | Attending: Adult Health | Admitting: Adult Health

## 2020-04-20 ENCOUNTER — Other Ambulatory Visit: Payer: Self-pay

## 2020-04-20 ENCOUNTER — Telehealth: Payer: Self-pay | Admitting: *Deleted

## 2020-04-20 DIAGNOSIS — K732 Chronic active hepatitis, not elsewhere classified: Secondary | ICD-10-CM | POA: Diagnosis not present

## 2020-04-20 DIAGNOSIS — R1011 Right upper quadrant pain: Secondary | ICD-10-CM | POA: Diagnosis present

## 2020-04-20 LAB — HEPATITIS A ANTIBODY, TOTAL: hep A Total Ab: NEGATIVE

## 2020-04-20 LAB — CBC WITH DIFFERENTIAL/PLATELET
Basophils Absolute: 0.1 10*3/uL (ref 0.0–0.2)
Basos: 1 %
EOS (ABSOLUTE): 1.2 10*3/uL — ABNORMAL HIGH (ref 0.0–0.4)
Eos: 12 %
Hematocrit: 37.7 % (ref 37.5–51.0)
Hemoglobin: 13.2 g/dL (ref 13.0–17.7)
Immature Grans (Abs): 0.1 10*3/uL (ref 0.0–0.1)
Immature Granulocytes: 1 %
Lymphocytes Absolute: 2.2 10*3/uL (ref 0.7–3.1)
Lymphs: 22 %
MCH: 32.1 pg (ref 26.6–33.0)
MCHC: 35 g/dL (ref 31.5–35.7)
MCV: 92 fL (ref 79–97)
Monocytes Absolute: 0.7 10*3/uL (ref 0.1–0.9)
Monocytes: 6 %
Neutrophils Absolute: 6 10*3/uL (ref 1.4–7.0)
Neutrophils: 58 %
Platelets: 184 10*3/uL (ref 150–450)
RBC: 4.11 x10E6/uL — ABNORMAL LOW (ref 4.14–5.80)
RDW: 11.5 % — ABNORMAL LOW (ref 11.6–15.4)
WBC: 10.2 10*3/uL (ref 3.4–10.8)

## 2020-04-20 LAB — HEPATITIS B CORE ANTIBODY, TOTAL: Hep B Core Total Ab: NEGATIVE

## 2020-04-20 MED ORDER — GADOBUTROL 1 MMOL/ML IV SOLN
7.5000 mL | Freq: Once | INTRAVENOUS | Status: AC | PRN
  Administered 2020-04-20: 7.5 mL via INTRAVENOUS

## 2020-04-20 NOTE — Progress Notes (Signed)
Not immune to Hep A or B may want to consider Vaccine can discuss with Dr. Allen Norris as well. He will be seeing him for active untreated chronic Hep C. MRI of liver is scheduled this morning for follow up from RUQ ultrasound.

## 2020-04-20 NOTE — Telephone Encounter (Signed)
Noted see my result note to call patient.

## 2020-04-20 NOTE — Telephone Encounter (Signed)
'  Malachy Mood' with [facility] Radiology calling with imaging results. Results are available in Epic:   IMPRESSION: 1. The common bile duct is dilated up to 1.0 cm centrally. No obstructing calculus or other lesion is appreciated to the ampulla. There is no intrahepatic biliary ductal dilatation. This findings is of uncertain significance. Correlate for biochemical evidence of cholestasis and consider ERCP to further assess. 2. There is a 5 mm focal arterially hyperenhancing lesion of the anterior liver dome, hepatic segment VII, without evidence of capsule or washout. No other abnormal or suspicious hepatic contrast enhancement. In the setting of chronic hepatitis C, this lesion is characterized as LI-RADS category 3, intermediate probability for hepatocellular carcinoma. Recommend follow-up MRI in 6 months.

## 2020-04-20 NOTE — Progress Notes (Signed)
Please advise patient that he has some dialation of his common bile duct possible could have some gallbladder issues, he also has a 5 mm focal lesion on his liver, radiologist recommends he have follow MRI in 6 months around 10/21/20 and I advise that he keep his soon follow up in 2 weeks with Dr. Allen Norris gastroenterology for further evaluation and treatment for chronic active hepatitis C if advised. It is of the utmost importance he keep this appointment.

## 2020-04-20 NOTE — Progress Notes (Signed)
Not immune to Hep A or B may want to consider Vaccine can discuss with Dr. Allen Norris as well. He will be seeing him for active untreated chronic Hep C. MRI of liver is scheduled this morning for follow up from RUQ ultrasound. CBC has within normal neutrophils now, and eosinophils are elevated suggesting allergies as he mentioned. Continue Flonase.

## 2020-05-09 ENCOUNTER — Other Ambulatory Visit: Payer: Self-pay

## 2020-05-09 ENCOUNTER — Encounter: Payer: Self-pay | Admitting: Gastroenterology

## 2020-05-09 ENCOUNTER — Ambulatory Visit (INDEPENDENT_AMBULATORY_CARE_PROVIDER_SITE_OTHER): Payer: Medicaid Other | Admitting: Gastroenterology

## 2020-05-09 VITALS — BP 103/65 | HR 82 | Ht 70.0 in | Wt 167.8 lb

## 2020-05-09 DIAGNOSIS — R109 Unspecified abdominal pain: Secondary | ICD-10-CM

## 2020-05-09 DIAGNOSIS — B182 Chronic viral hepatitis C: Secondary | ICD-10-CM

## 2020-05-09 DIAGNOSIS — K5900 Constipation, unspecified: Secondary | ICD-10-CM | POA: Diagnosis not present

## 2020-05-09 DIAGNOSIS — Z1211 Encounter for screening for malignant neoplasm of colon: Secondary | ICD-10-CM

## 2020-05-09 MED ORDER — CLENPIQ 10-3.5-12 MG-GM -GM/160ML PO SOLN: 320.0000 mL | ORAL | 0 refills | Status: DC

## 2020-05-09 NOTE — Progress Notes (Signed)
Gastroenterology Consultation  Referring Provider:     Sharmon Leyden* Primary Care Physician:  Doreen Beam, FNP Primary Gastroenterologist:  Dr. Allen Norris     Reason for Consultation:     Hepatitis C and right sided abdominal pain        HPI:   Richard Vasquez is a 60 y.o. y/o male referred for consultation & management of hepatitis C and right sided abdominal pain by Dr. Claudina Lick, Kelby Aline, FNP.  This patient comes in today and reports that he has been told he has hepatitis C.  The patient states he knows he got it from tattoos and sharing needles with tattoos 20 years ago.  The patient denies any IV drug use.  The patient had imaging of his liver that showed:  IMPRESSION: 1. The common bile duct is dilated up to 1.0 cm centrally. No obstructing calculus or other lesion is appreciated to the ampulla. There is no intrahepatic biliary ductal dilatation. This findings is of uncertain significance. Correlate for biochemical evidence of cholestasis and consider ERCP to further assess. 2. There is a 5 mm focal arterially hyperenhancing lesion of the anterior liver dome, hepatic segment VII, without evidence of capsule or washout. No other abnormal or suspicious hepatic contrast enhancement. In the setting of chronic hepatitis C, this lesion is characterized as LI-RADS category 3, intermediate probability for hepatocellular carcinoma. Recommend follow-up MRI in 6 months.  He denies any alcohol abuse.  The patient is not having an abdominal pain now and states that he has a abdominal hernia that will flareup sometimes giving him some abdominal pain.  There is no report of any unexplained weight loss fevers chills nausea or vomiting.  The patient is very interested in treating his hepatitis C. the patient's liver enzymes have not been elevated.  Past Medical History:  Diagnosis Date  . Anxiety   . Arthritis   . BPH (benign prostatic hyperplasia)   . Chronic pain   .  Closed fracture of distal end of right femur ([place]) 06/09/2018  . Depression   . Hepatitis C   . Paranoid schizophrenia ([place])   . Sleep apnea     Past Surgical History:  Procedure Laterality Date  . FRACTURE SURGERY    . HIP PINNING,CANNULATED Left 08/18/2018   Procedure: CANNULATED HIP PINNING, Right knee aspiration;  Surgeon: Thornton Park, MD;  Location: [facility] ORS;  Service: Orthopedics;  Laterality: Left;  . JOINT REPLACEMENT    . ORIF FEMUR FRACTURE Right 06/10/2018   Procedure: OPEN REDUCTION INTERNAL FIXATION (ORIF) DISTAL FEMUR FRACTURE;  Surgeon: Shona Needles, MD;  Location: [place];  Service: Orthopedics;  Laterality: Right;    Prior to Admission medications   Medication Sig Start Date End Date Taking? Authorizing Provider  docusate sodium (COLACE) 100 MG capsule Take 1 capsule (100 mg total) by mouth 2 (two) times daily. 03/17/20  Yes Flinchum, Kelby Aline, FNP  OLANZapine (ZYPREXA) 5 MG tablet Take 1 tablet (5 mg total) by mouth daily. 08/31/18 08/31/19 Yes Bettey Costa, MD  Omega-3 Fatty Acids (FISH OIL) 1000 MG CAPS Take by mouth. 06/18/18  Yes [provider]  polyethylene glycol (MIRALAX / GLYCOLAX) 17 g packet Take 17 g by mouth daily as needed for moderate constipation or severe constipation. 03/17/20  Yes Flinchum, Kelby Aline, FNP  QUEtiapine (SEROQUEL) 200 MG tablet Take 1 tablet (200 mg total) by mouth at bedtime. 08/31/18  Yes Bettey Costa, MD  traZODone (DESYREL) 100 MG tablet  Take 1 tablet (100 mg total) by mouth at bedtime. 08/31/18  Yes Bettey Costa, MD    Family History  Problem Relation Age of Onset  . Cancer Mother   . Diabetes Mother   . Diabetes Paternal Aunt   . Lung cancer Paternal Uncle      Social History   Tobacco Use  . Smoking status: Current Some Day Smoker    Packs/day: 1.00    Years: 20.00    Pack years: 20.00    Types: Cigarettes  . Smokeless tobacco: Never Used  Vaping Use  . Vaping Use: Never used  Substance Use Topics  . Alcohol  use: Not Currently  . Drug use: Not Currently    Allergies as of 05/09/2020  . (No Known Allergies)    Review of Systems:    All systems reviewed and negative except where noted in HPI.   Physical Exam:  BP 103/65   Pulse 82   Ht 5\' 10"  (1.778 m)   Wt 167 lb 12.8 oz (76.1 kg)   BMI 24.08 kg/m  No LMP for male patient. General:   Alert,  Well-developed, well-nourished, pleasant and cooperative in NAD Head:  Normocephalic and atraumatic. Eyes:  Sclera clear, no icterus.   Conjunctiva pink. Ears:  Normal auditory acuity. Neck:  Supple; no masses or thyromegaly. Lungs:  Respirations even and unlabored.  Clear throughout to auscultation.   No wheezes, crackles, or rhonchi. No acute distress. Heart:  Regular rate and rhythm; no murmurs, clicks, rubs, or gallops. Abdomen:  Normal bowel sounds.  No bruits.  Soft, non-tender and non-distended without masses, hepatosplenomegaly or hernias noted.  No guarding or rebound tenderness.  Negative Carnett sign.   Rectal:  Deferred.  Pulses:  Normal pulses noted. Extremities:  No clubbing or edema.  No cyanosis. Neurologic:  Alert and oriented x3;  grossly normal neurologically. Skin:  Intact without significant lesions or rashes.  No jaundice. Lymph Nodes:  No significant cervical adenopathy. Psych:  Alert and cooperative. Normal mood and affect.  Imaging Studies: MR Abdomen W Wo Contrast  Result Date: 04/20/2020 CLINICAL DATA:  Hepatitis C, right upper quadrant abdominal pain EXAM: MRI ABDOMEN WITHOUT AND WITH CONTRAST TECHNIQUE: Multiplanar multisequence MR imaging of the abdomen was performed both before and after the administration of intravenous contrast. CONTRAST:  7.13mL GADAVIST GADOBUTROL 1 MMOL/ML IV SOLN COMPARISON:  Right upper quadrant ultrasound, 03/29/2020 FINDINGS: Lower chest: No acute findings. Hepatobiliary: There is a focal arterially hyperenhancing lesion of the anterior liver dome, hepatic segment VII, measuring 5 mm (series  12, image 24). No evidence of capsule or washout. No other abnormal or suspicious hepatic contrast enhancement. No definite mass or other parenchymal abnormality identified. The common bile duct is dilated up to 1.0 cm centrally. No obstructing calculus or other lesion is appreciated. There is no intrahepatic biliary ductal dilatation. Pancreas: No mass, inflammatory changes, or other parenchymal abnormality identified. Spleen:  Within normal limits in size and appearance. Adrenals/Urinary Tract: No masses identified. No evidence of hydronephrosis. Stomach/Bowel: Visualized portions within the abdomen are unremarkable. Vascular/Lymphatic: No pathologically enlarged lymph nodes identified. No abdominal aortic aneurysm demonstrated. Other:  None. Musculoskeletal: No suspicious bone lesions identified. IMPRESSION: 1. The common bile duct is dilated up to 1.0 cm centrally. No obstructing calculus or other lesion is appreciated to the ampulla. There is no intrahepatic biliary ductal dilatation. This findings is of uncertain significance. Correlate for biochemical evidence of cholestasis and consider ERCP to further assess. 2. There is a  5 mm focal arterially hyperenhancing lesion of the anterior liver dome, hepatic segment VII, without evidence of capsule or washout. No other abnormal or suspicious hepatic contrast enhancement. In the setting of chronic hepatitis C, this lesion is characterized as LI-RADS category 3, intermediate probability for hepatocellular carcinoma. Recommend follow-up MRI in 6 months. These results will be called to the ordering clinician or representative by the Radiologist Assistant, and communication documented in the PACS or [employer]. Electronically Signed   By: Eddie Candle M.D.   On: 04/20/2020 12:33    Assessment and Plan:   STEFANO TRULSON is a 60 y.o. y/o male who comes in today with a history of hepatitis C from tattoos over 20 years ago.  The patient has never been treated  for his hepatitis C in the past.  The patient will have lab sent off prior to starting his hepatitis C treatment and will also be evaluated for his immunity to have both hepatitis A and hepatitis B.  He will be vaccinated accordingly.  The patient has been told about his MRI and he states that he was told that he had a 5 mm lesion in his liver.  The patient will have alpha-fetoprotein checked to see if this is elevated and radiology has recommended a repeat MRI in 6 months.  The patient has been told that since he has never had a colonoscopy and he has this constipation with abdominal pain he should be set up for colonoscopy.  The patient will be set up for a colonoscopy.  The patient has been explained the plan and agrees with it.    [name], MD. Marval Regal    Note: This dictation was prepared with Dragon dictation along with smaller phrase technology. Any transcriptional errors that result from this process are unintentional.

## 2020-05-12 LAB — HEPATITIS C GENOTYPE

## 2020-05-12 LAB — FERRITIN: Ferritin: 214 ng/mL (ref 30–400)

## 2020-05-12 LAB — CERULOPLASMIN: Ceruloplasmin: 25.8 mg/dL (ref 16.0–31.0)

## 2020-05-12 LAB — IRON AND TIBC
Iron Saturation: 19 % (ref 15–55)
Iron: 63 ug/dL (ref 38–169)
Total Iron Binding Capacity: 328 ug/dL (ref 250–450)
UIBC: 265 ug/dL (ref 111–343)

## 2020-05-12 LAB — HEPATIC FUNCTION PANEL
ALT: 15 IU/L (ref 0–44)
AST: 18 IU/L (ref 0–40)
Albumin: 4.3 g/dL (ref 3.8–4.9)
Alkaline Phosphatase: 69 IU/L (ref 44–121)
Bilirubin Total: 0.2 mg/dL (ref 0.0–1.2)
Bilirubin, Direct: 0.11 mg/dL (ref 0.00–0.40)
Total Protein: 6.7 g/dL (ref 6.0–8.5)

## 2020-05-12 LAB — HCV FIBROSURE
ALPHA 2-MACROGLOBULINS, QN: 287 mg/dL — ABNORMAL HIGH (ref 110–276)
ALT (SGPT) P5P: 19 IU/L (ref 0–55)
Apolipoprotein A-1: 95 mg/dL — ABNORMAL LOW (ref 101–178)
Bilirubin, Total: 0.2 mg/dL (ref 0.0–1.2)
Fibrosis Score: 0.4 — ABNORMAL HIGH (ref 0.00–0.21)
GGT: 51 IU/L (ref 0–65)
Haptoglobin: 199 mg/dL (ref 29–370)
Necroinflammat Activity Score: 0.09 (ref 0.00–0.17)

## 2020-05-12 LAB — HCV RNA QUANT
HCV log10: 5.851 log10 IU/mL
Hepatitis C Quantitation: 710000 IU/mL

## 2020-05-12 LAB — ANTI-SMOOTH MUSCLE ANTIBODY, IGG: Smooth Muscle Ab: 5 Units (ref 0–19)

## 2020-05-12 LAB — HEPATITIS B SURFACE ANTIGEN: Hepatitis B Surface Ag: NEGATIVE

## 2020-05-12 LAB — AFP TUMOR MARKER: AFP, Serum, Tumor Marker: 4 ng/mL (ref 0.0–8.4)

## 2020-05-12 LAB — HEPATITIS A ANTIBODY, TOTAL: hep A Total Ab: NEGATIVE

## 2020-05-12 LAB — MITOCHONDRIAL ANTIBODIES: Mitochondrial Ab: <20 Units (ref 0.0–20.0)

## 2020-05-12 LAB — ANA: Anti Nuclear Antibody (ANA): NEGATIVE

## 2020-05-12 LAB — ALPHA-1-ANTITRYPSIN: A-1 Antitrypsin: 171 mg/dL (ref 101–187)

## 2020-05-12 LAB — HEPATITIS B SURFACE ANTIBODY,QUALITATIVE: Hep B Surface Ab, Qual: REACTIVE

## 2020-05-18 ENCOUNTER — Encounter: Payer: Self-pay | Admitting: Gastroenterology

## 2020-05-18 ENCOUNTER — Ambulatory Visit
Admission: RE | Admit: 2020-05-18 | Discharge: 2020-05-18 | Disposition: A | Discharge status: Home / Self Care | Payer: Medicaid Other | Attending: Gastroenterology | Admitting: Gastroenterology

## 2020-05-18 ENCOUNTER — Encounter
Admission: RE | Disposition: A | Discharge status: Home / Self Care | Payer: Self-pay | Source: Home / Self Care | Attending: Gastroenterology

## 2020-05-18 ENCOUNTER — Ambulatory Visit: Payer: Medicaid Other | Admitting: Anesthesiology

## 2020-05-18 ENCOUNTER — Other Ambulatory Visit: Payer: Self-pay

## 2020-05-18 DIAGNOSIS — K635 Polyp of colon: Secondary | ICD-10-CM

## 2020-05-18 DIAGNOSIS — D120 Benign neoplasm of cecum: Secondary | ICD-10-CM | POA: Diagnosis not present

## 2020-05-18 DIAGNOSIS — Z1211 Encounter for screening for malignant neoplasm of colon: Secondary | ICD-10-CM | POA: Diagnosis not present

## 2020-05-18 DIAGNOSIS — Z801 Family history of malignant neoplasm of trachea, bronchus and lung: Secondary | ICD-10-CM | POA: Diagnosis not present

## 2020-05-18 DIAGNOSIS — Z833 Family history of diabetes mellitus: Secondary | ICD-10-CM | POA: Insufficient documentation

## 2020-05-18 DIAGNOSIS — F17210 Nicotine dependence, cigarettes, uncomplicated: Secondary | ICD-10-CM | POA: Diagnosis not present

## 2020-05-18 DIAGNOSIS — D122 Benign neoplasm of ascending colon: Secondary | ICD-10-CM | POA: Diagnosis not present

## 2020-05-18 DIAGNOSIS — K641 Second degree hemorrhoids: Secondary | ICD-10-CM | POA: Diagnosis not present

## 2020-05-18 DIAGNOSIS — K5900 Constipation, unspecified: Secondary | ICD-10-CM | POA: Diagnosis not present

## 2020-05-18 DIAGNOSIS — Z8619 Personal history of other infectious and parasitic diseases: Secondary | ICD-10-CM | POA: Insufficient documentation

## 2020-05-18 DIAGNOSIS — Z79899 Other long term (current) drug therapy: Secondary | ICD-10-CM | POA: Insufficient documentation

## 2020-05-18 DIAGNOSIS — Z809 Family history of malignant neoplasm, unspecified: Secondary | ICD-10-CM | POA: Insufficient documentation

## 2020-05-18 HISTORY — PX: COLONOSCOPY WITH PROPOFOL: SHX5780

## 2020-05-18 SURGERY — COLONOSCOPY WITH PROPOFOL
Anesthesia: General

## 2020-05-18 MED ORDER — PROPOFOL 500 MG/50ML IV EMUL
INTRAVENOUS | Status: DC | PRN
  Administered 2020-05-18: 165 ug/kg/min via INTRAVENOUS

## 2020-05-18 MED ORDER — SODIUM CHLORIDE 0.9 % IV SOLN: INTRAVENOUS | Status: DC

## 2020-05-18 MED ORDER — LIDOCAINE HCL (CARDIAC) PF 100 MG/5ML IV SOSY
PREFILLED_SYRINGE | INTRAVENOUS | Status: DC | PRN
  Administered 2020-05-18: 100 mg via INTRAVENOUS

## 2020-05-18 MED ORDER — PROPOFOL 10 MG/ML IV BOLUS
INTRAVENOUS | Status: DC | PRN
  Administered 2020-05-18: 60 mg via INTRAVENOUS
  Administered 2020-05-18 (×2): 10 mg via INTRAVENOUS

## 2020-05-18 NOTE — Op Note (Addendum)
[facility] Gastroenterology Patient Name: Richard Vasquez Procedure Date: 05/18/2020 8:32 AM MRN: [id number] Account #: [id number] Date of Birth: [birth year] Admit Type: Outpatient Age: 60 Room: [facility] ENDO ROOM 4 Gender: Male Note Status: Finalized Procedure:             Colonoscopy Indications:           Constipation Providers:             [name] MD, MD Referring MD:          Kelby Aline. Flinchum (Referring MD) Medicines:             Propofol per Anesthesia Complications:         No immediate complications. Procedure:             Pre-Anesthesia Assessment:                        - Prior to the procedure, a History and Physical was                         performed, and patient medications and allergies were                         reviewed. The patient's tolerance of previous                         anesthesia was also reviewed. The risks and benefits                         of the procedure and the sedation options and risks                         were discussed with the patient. All questions were                         answered, and informed consent was obtained. Prior                         Anticoagulants: The patient has taken no previous                         anticoagulant or antiplatelet agents. ASA Grade                         Assessment: II - A patient with mild systemic disease.                         After reviewing the risks and benefits, the patient                         was deemed in satisfactory condition to undergo the                         procedure.                        After obtaining informed consent, the colonoscope was                         passed  under direct vision. Throughout the procedure,                         the patient's blood pressure, pulse, and oxygen                         saturations were monitored continuously. The                         Colonoscope was introduced through the anus and                          advanced to the the cecum, identified by appendiceal                         orifice and ileocecal valve. The colonoscopy was                         performed without difficulty. The patient tolerated                         the procedure well. The quality of the bowel                         preparation was good. Findings:      The perianal and digital rectal examinations were normal.      A 5 mm polyp was found in the cecum. The polyp was sessile. The polyp       was removed with a cold snare. Resection and retrieval were complete.      A 2 mm polyp was found in the ascending colon. The polyp was sessile.       The polyp was removed with a cold snare. Resection and retrieval were       complete.      Non-bleeding internal hemorrhoids were found during retroflexion. The       hemorrhoids were Grade II (internal hemorrhoids that prolapse but reduce       spontaneously).      Diffuse mild mucosal changes characterized by erythema were found in the       entire colon. Biopsies were taken with a cold forceps for histology. Impression:            - One 5 mm polyp in the cecum, removed with a cold                         snare. Resected and retrieved.                        - One 2 mm polyp in the ascending colon, removed with                         a cold snare. Resected and retrieved.                        - Non-bleeding internal hemorrhoids.                        - Diffuse mild mucosal changes were found in the  entire examined colon. Biopsied. Recommendation:        - Discharge patient to home.                        - Resume previous diet.                        - Continue present medications.                        - Await pathology results.                        - Repeat colonoscopy in 7 years for surveillance if                         adenomatous and 10 years if adenomatous. Procedure Code(s):     --- Professional ---                        [phone],  Colonoscopy, flexible; with removal of                         tumor(s), polyp(s), or other lesion(s) by snare                         technique                        45380, 59, Colonoscopy, flexible; with biopsy, single                         or multiple Diagnosis Code(s):     --- Professional ---                        K59.00, Constipation, unspecified                        K63.5, Polyp of colon CPT copyright 2019 American Medical Association. All rights reserved. The codes documented in this report are preliminary and upon coder review may  be revised to meet current compliance requirements. [name] MD, MD 05/18/2020 9:04:17 AM This report has been signed electronically. Number of Addenda: 0 Note Initiated On: 05/18/2020 8:32 AM Scope Withdrawal Time: 0 hours 10 minutes 10 seconds  Total Procedure Duration: 0 hours 16 minutes 22 seconds  Estimated Blood Loss:  Estimated blood loss: none.      [facility]

## 2020-05-18 NOTE — Transfer of Care (Signed)
Immediate Anesthesia Transfer of Care Note  Patient: CATALDO COSGRIFF  Procedure(s) Performed: COLONOSCOPY WITH PROPOFOL (N/A )  Patient Location: Endoscopy Unit  Anesthesia Type:General  Level of Consciousness: drowsy and patient cooperative  Airway & Oxygen Therapy: Patient Spontanous Breathing and Patient connected to face mask oxygen  Post-op Assessment: Report given to RN and Post -op Vital signs reviewed and stable  Post vital signs: Reviewed and stable  Last Vitals:  Vitals Value Taken Time  BP 106/73 05/18/20 0903  Temp 36.2 C 05/18/20 0903  Pulse 79 05/18/20 0903  Resp 16 05/18/20 0903  SpO2 100 % 05/18/20 0903    Last Pain:  Vitals:   05/18/20 0903  TempSrc: Temporal  PainSc:          Complications: No complications documented.

## 2020-05-18 NOTE — Anesthesia Procedure Notes (Signed)
Procedure Name: General with mask airway Performed by: Fletcher-Harrison, Harlow Carrizales, CRNA Pre-anesthesia Checklist: Patient identified, Emergency Drugs available, Suction available and Patient being monitored Patient Re-evaluated:Patient Re-evaluated prior to induction Oxygen Delivery Method: Simple face mask Induction Type: IV induction Placement Confirmation: positive ETCO2 and CO2 detector Dental Injury: Teeth and Oropharynx as per pre-operative assessment        

## 2020-05-18 NOTE — Anesthesia Postprocedure Evaluation (Signed)
Anesthesia Post Note  Patient: Richard Vasquez  Procedure(s) Performed: COLONOSCOPY WITH PROPOFOL (N/A )  Patient location during evaluation: Endoscopy Anesthesia Type: General Level of consciousness: awake and alert Pain management: pain level controlled Vital Signs Assessment: post-procedure vital signs reviewed and stable Respiratory status: spontaneous breathing and respiratory function stable Cardiovascular status: stable Anesthetic complications: no   No complications documented.   Last Vitals:  Vitals:   05/18/20 0903 05/18/20 0913  BP: 106/73 (!) 131/92  Pulse: 79 89  Resp: 16 11  Temp: (!) 36.2 C   SpO2: 100% 100%    Last Pain:  Vitals:   05/18/20 0913  TempSrc:   PainSc: 0-No pain                 Reha Martinovich K

## 2020-05-18 NOTE — H&P (Signed)
[name], MD [facility] [address], [place], [place] [address] Phone:[phone] Fax : [phone]  Primary Care Physician:  Doreen Beam, FNP Primary Gastroenterologist:  Dr. Allen Norris  Pre-Procedure History & Physical: HPI:  Richard Vasquez is a 61 y.o. male is here for an colonoscopy.   Past Medical History:  Diagnosis Date  . Anxiety   . Arthritis   . BPH (benign prostatic hyperplasia)   . Chronic pain   . Closed fracture of distal end of right femur ([place]) 06/09/2018  . Depression   . Hepatitis C   . Paranoid schizophrenia ([place])   . Sleep apnea     Past Surgical History:  Procedure Laterality Date  . FRACTURE SURGERY    . HIP PINNING,CANNULATED Left 08/18/2018   Procedure: CANNULATED HIP PINNING, Right knee aspiration;  Surgeon: Thornton Park, MD;  Location: [facility] ORS;  Service: Orthopedics;  Laterality: Left;  . JOINT REPLACEMENT    . ORIF FEMUR FRACTURE Right 06/10/2018   Procedure: OPEN REDUCTION INTERNAL FIXATION (ORIF) DISTAL FEMUR FRACTURE;  Surgeon: Shona Needles, MD;  Location: [place];  Service: Orthopedics;  Laterality: Right;    Prior to Admission medications   Medication Sig Start Date End Date Taking? Authorizing Provider  Omega-3 Fatty Acids (FISH OIL) 1000 MG CAPS Take by mouth. 06/18/18  Yes [provider]  QUEtiapine (SEROQUEL) 200 MG tablet Take 1 tablet (200 mg total) by mouth at bedtime. 08/31/18  Yes Bettey Costa, MD  traZODone (DESYREL) 100 MG tablet Take 1 tablet (100 mg total) by mouth at bedtime. 08/31/18  Yes Mody, Ulice Bold, MD  docusate sodium (COLACE) 100 MG capsule Take 1 capsule (100 mg total) by mouth 2 (two) times daily. 03/17/20   Flinchum, Kelby Aline, FNP  OLANZapine (ZYPREXA) 5 MG tablet Take 1 tablet (5 mg total) by mouth daily. 08/31/18 08/31/19  Bettey Costa, MD  polyethylene glycol (MIRALAX / GLYCOLAX) 17 g packet Take 17 g by mouth daily as needed for moderate constipation or severe constipation. 03/17/20   Flinchum,  Kelby Aline, FNP  Sod Picosulfate-Mag Ox-Cit Acd (CLENPIQ) 10-3.5-12 MG-GM -GM/160ML SOLN Take 320 mLs by mouth as directed. 05/09/20   [name], MD    Allergies as of 05/10/2020  . (No Known Allergies)    Family History  Problem Relation Age of Onset  . Cancer Mother   . Diabetes Mother   . Diabetes Paternal Aunt   . Lung cancer Paternal Uncle     Social History   Socioeconomic History  . Marital status: Divorced    Spouse name: Not on file  . Number of children: Not on file  . Years of education: Not on file  . Highest education level: Not on file  Occupational History  . Not on file  Tobacco Use  . Smoking status: Current Some Day Smoker    Packs/day: 1.00    Years: 20.00    Pack years: 20.00    Types: Cigarettes  . Smokeless tobacco: Never Used  Vaping Use  . Vaping Use: Never used  Substance and Sexual Activity  . Alcohol use: Not Currently  . Drug use: Not Currently  . Sexual activity: Not on file  Other Topics Concern  . Not on file  Social History Narrative  . Not on file   Social Determinants of Health   Financial Resource Strain: Not on file  Food Insecurity: Not on file  Transportation Needs: Not on file  Physical Activity: Not on file  Stress: Not on file  Social Connections: Not on file  Intimate Partner Violence: Not on file    Review of Systems: See HPI, otherwise negative ROS  Physical Exam: BP (!) 129/102   Pulse 95   Temp (!) 97 F (36.1 C) (Temporal)   Resp 20   Ht 5\' 10"  (1.778 m)   Wt 73.5 kg   SpO2 96%   BMI 23.24 kg/m  General:   Alert,  pleasant and cooperative in NAD Head:  Normocephalic and atraumatic. Neck:  Supple; no masses or thyromegaly. Lungs:  Clear throughout to auscultation.    Heart:  Regular rate and rhythm. Abdomen:  Soft, nontender and nondistended. Normal bowel sounds, without guarding, and without rebound.   Neurologic:  Alert and  oriented x4;  grossly normal  neurologically.  Impression/Plan: Richard Vasquez is here for an colonoscopy to be performed for abdominal pain and constipation  Risks, benefits, limitations, and alternatives regarding  colonoscopy have been reviewed with the patient.  Questions have been answered.  All parties agreeable.   [name], MD  05/18/2020, 8:30 AM

## 2020-05-18 NOTE — Anesthesia Preprocedure Evaluation (Addendum)
Anesthesia Evaluation  Patient identified by MRN, date of birth, ID band Patient awake    Reviewed: Allergy & Precautions, NPO status , Patient's Chart, lab work & pertinent test results  History of Anesthesia Complications Negative for: history of anesthetic complications  Airway Mallampati: II  TM Distance: >3 FB Neck ROM: Full    Dental no notable dental hx. (+) Teeth Intact   Pulmonary sleep apnea , neg COPD, Current Smoker and Patient abstained from smoking.,    Pulmonary exam normal breath sounds clear to auscultation       Cardiovascular Exercise Tolerance: Good METS(-) hypertension(-) CAD and (-) Past MI negative cardio ROS  (-) dysrhythmias  Rhythm:Regular Rate:Normal - Systolic murmurs    Neuro/Psych PSYCHIATRIC DISORDERS Anxiety Depression Schizophrenia    GI/Hepatic neg GERD  ,(+)     (-) substance abuse  , Hepatitis -  Endo/Other  neg diabetes  Renal/GU negative Renal ROS     Musculoskeletal   Abdominal   Peds  Hematology   Anesthesia Other Findings Past Medical History: No date: Anxiety No date: Arthritis No date: BPH (benign prostatic hyperplasia) No date: Chronic pain 06/09/2018: Closed fracture of distal end of right femur ([place]) No date: Depression No date: Hepatitis C No date: Paranoid schizophrenia ([place]) No date: Sleep apnea  Reproductive/Obstetrics                                                             Anesthesia Evaluation  Patient identified by MRN, date of birth, ID band Patient awake    Reviewed: Allergy & Precautions, H&P , NPO status , Patient's Chart, lab work & pertinent test results, reviewed documented beta blocker date and time   Airway Mallampati: II  TM Distance: >3 FB Neck ROM: full    Dental  (+) Teeth Intact   Pulmonary neg pulmonary ROS, Current Smoker,    Pulmonary exam normal        Cardiovascular negative cardio  ROS Normal cardiovascular exam Rhythm:regular Rate:Normal     Neuro/Psych PSYCHIATRIC DISORDERS Schizophrenia negative neurological ROS     GI/Hepatic negative GI ROS, Neg liver ROS,   Endo/Other  negative endocrine ROS  Renal/GU negative Renal ROS  negative genitourinary   Musculoskeletal   Abdominal   Peds  Hematology negative hematology ROS (+)   Anesthesia Other Findings Past Medical History: No date: BPH (benign prostatic hyperplasia) No date: Chronic pain 06/09/2018: Closed fracture of distal end of right femur [facility]) Past Surgical History: 06/10/2018: ORIF FEMUR FRACTURE; Right     Comment:  Procedure: OPEN REDUCTION INTERNAL FIXATION (ORIF)               DISTAL FEMUR FRACTURE;  Surgeon: Shona Needles, MD;                Location: [facility];  Service: Orthopedics;  Laterality:               Right;   Reproductive/Obstetrics negative OB ROS                             Anesthesia Physical Anesthesia Plan  ASA: II and emergent  Anesthesia Plan: General ETT   Post-op Pain Management:    Induction:   PONV Risk Score  and Plan:   Airway Management Planned:   Additional Equipment:   Intra-op Plan:   Post-operative Plan:   Informed Consent: I have reviewed the patients History and Physical, chart, labs and discussed the procedure including the risks, benefits and alternatives for the proposed anesthesia with the patient or authorized representative who has indicated his/her understanding and acceptance.     Dental Advisory Given  Plan Discussed with: CRNA  Anesthesia Plan Comments:         Anesthesia Quick Evaluation  Anesthesia Physical Anesthesia Plan  ASA: III  Anesthesia Plan: General   Post-op Pain Management:    Induction: Intravenous  PONV Risk Score and Plan: 2 and Ondansetron, Propofol infusion and TIVA  Airway Management Planned: Nasal Cannula  Additional Equipment: None  Intra-op Plan:    Post-operative Plan:   Informed Consent: I have reviewed the patients History and Physical, chart, labs and discussed the procedure including the risks, benefits and alternatives for the proposed anesthesia with the patient or authorized representative who has indicated his/her understanding and acceptance.     Dental advisory given  Plan Discussed with: CRNA and Surgeon  Anesthesia Plan Comments: (Discussed risks of anesthesia with patient, including possibility of difficulty with spontaneous ventilation under anesthesia necessitating airway intervention, PONV, and rare risks such as cardiac or respiratory or neurological events. Patient understands.)        Anesthesia Quick Evaluation

## 2020-05-19 ENCOUNTER — Encounter: Payer: Self-pay | Admitting: Gastroenterology

## 2020-05-19 LAB — SURGICAL PATHOLOGY

## 2020-05-21 ENCOUNTER — Encounter: Payer: Self-pay | Admitting: Gastroenterology

## 2020-05-24 ENCOUNTER — Telehealth: Payer: Self-pay

## 2020-05-24 NOTE — Telephone Encounter (Signed)
-----   Message from [name], MD sent at 05/11/2020  6:27 PM EDT ----- That the patient know that his hepatitis be showed him to be immune but his hepatitis A was negative and he needs a vaccination for hepatitis A.  His fibrosis scan showed no sign of cirrhosis or advanced fibrosis. The patient will need to start treatment for his hepatitis C when his labs are all back.

## 2020-05-24 NOTE — Telephone Encounter (Signed)
Pt notified of lab results. Request for Hep C treatment has been sent to [facility].

## 2020-07-05 ENCOUNTER — Other Ambulatory Visit: Payer: Self-pay | Admitting: Adult Health

## 2020-07-05 DIAGNOSIS — K5900 Constipation, unspecified: Secondary | ICD-10-CM

## 2020-07-25 ENCOUNTER — Ambulatory Visit: Payer: Medicaid Other | Admitting: Adult Health

## 2020-07-25 ENCOUNTER — Ambulatory Visit: Payer: Self-pay | Admitting: *Deleted

## 2020-07-25 NOTE — Telephone Encounter (Signed)
Patient is calling to report he has had cough/chest congestion for 2 weeks. Patient states no fever or sinus symptoms. Patient reports he has been using OTC treatment- but it is not clearing and he is now coughing green sputum. Patient states she has a history of Bronchitis and feels that he has developed this. Patient is requesting antibiotic treatment- advised patient will have to have an appointment for treatment. Patient advised to do home COVID test if he has one to rule COVID out as reason for illness. Patient call will be sent to office for review and scheduling- no appointment availiable within disposition.

## 2020-07-25 NOTE — Telephone Encounter (Signed)
Pt called in stating he believes he has bronchitis, and this would not be there first time. Pt states that he had been put on a antidotic before for this, and was wanting to see about getting some again. Please advise. Reason for Disposition  [1] Continuous (nonstop) coughing interferes with work or school AND [2] no improvement using cough treatment per Care Advice  Answer Assessment - Initial Assessment Questions 1. ONSET: "When did the cough begin?"      2 weeks 2. SEVERITY: "How bad is the cough today?"      Spells/spasms 3. SPUTUM: "Describe the color of your sputum" (none, dry cough; clear, white, yellow, green)     green 4. HEMOPTYSIS: "Are you coughing up any blood?" If so ask: "How much?" (flecks, streaks, tablespoons, etc.)     no 5. DIFFICULTY BREATHING: "Are you having difficulty breathing?" If Yes, ask: "How bad is it?" (e.g., mild, moderate, severe)    - MILD: No SOB at rest, mild SOB with walking, speaks normally in sentences, can lie down, no retractions, pulse < 100.    - MODERATE: SOB at rest, SOB with minimal exertion and prefers to sit, cannot lie down flat, speaks in phrases, mild retractions, audible wheezing, pulse 100-120.    - SEVERE: Very SOB at rest, speaks in single words, struggling to breathe, sitting hunched forward, retractions, pulse > 120      No problems- patient has decreased smoking 6. FEVER: "Do you have a fever?" If Yes, ask: "What is your temperature, how was it measured, and when did it start?"     no 7. CARDIAC HISTORY: "Do you have any history of heart disease?" (e.g., heart attack, congestive heart failure)      no 8. LUNG HISTORY: "Do you have any history of lung disease?"  (e.g., pulmonary embolus, asthma, emphysema)     no 9. PE RISK FACTORS: "Do you have a history of blood clots?" (or: recent major surgery, recent prolonged travel, bedridden)     no 10. OTHER SYMPTOMS: "Do you have any other symptoms?" (e.g., runny nose, wheezing, chest  pain)       Fatigue from coughing 11. PREGNANCY: "Is there any chance you are pregnant?" "When was your last menstrual period?"       N/a 12. TRAVEL: "Have you traveled out of the country in the last month?" (e.g., travel history, exposures)       no  Protocols used: Cough - Acute Productive-A-AH

## 2020-09-25 ENCOUNTER — Ambulatory Visit: Payer: Medicaid Other | Admitting: Adult Health

## 2020-10-09 ENCOUNTER — Other Ambulatory Visit: Payer: Self-pay | Admitting: Family Medicine

## 2020-10-09 DIAGNOSIS — K5900 Constipation, unspecified: Secondary | ICD-10-CM

## 2020-10-09 NOTE — Telephone Encounter (Signed)
Requested medication (s) are due for refill today: yes  Requested medication (s) are on the active medication list: yes  Last refill:  07/20/20   Future visit scheduled: no  Notes to clinic:  do you want to refill Rx? Future visit with Chalmers Cater, NP at [facility]     Requested Prescriptions  Pending Prescriptions Disp Refills   Polyethylene Glycol 3350 (PEG 3350) 17 g PACK [Pharmacy Med Name: PEG 3350 17 GM Oral Packet] 30 each 0    Sig: MIX ONE PACKET IN LIQUID AND DRINK BY MOUTH ONCE DAILY AS NEEDED FOR MODERATE OR SEVERE CONSTIPATION     Gastroenterology:  L[place] Passed - 10/09/2020  1:56 PM      Passed - Valid encounter within last 12 months    Recent Outpatient Visits           5 months ago Chronic active hepatitis [facility])   [place], FNP   6 months ago Frequent urination   [place] Flinchum, Kelby Aline, [place]       Future Appointments             In 3 weeks Flinchum, Kelby Aline, [place], [facility]

## 2020-10-31 ENCOUNTER — Ambulatory Visit: Payer: Medicaid Other | Admitting: Adult Health

## 2020-11-08 ENCOUNTER — Other Ambulatory Visit: Payer: Self-pay | Admitting: Family

## 2020-11-08 ENCOUNTER — Telehealth: Payer: Self-pay | Admitting: Adult Health

## 2020-11-08 DIAGNOSIS — K5900 Constipation, unspecified: Secondary | ICD-10-CM

## 2020-11-08 MED ORDER — PEG 3350 17 G PO PACK: PACK | ORAL | 4 refills | Status: DC

## 2020-11-08 NOTE — Telephone Encounter (Signed)
Patient is calling in to request a refill on his Polyethylene Glycol 3350 (PEG 3350) 17 g PACK,he will be out of medicine before his appointment on 11/18 with Dr.Tullo.Please send to the [facility] on [address].

## 2020-11-20 NOTE — Addendum Note (Signed)
Encounter addended by: Annie Paras on: 11/20/2020 10:58 AM  Actions taken: Letter saved

## 2020-11-27 ENCOUNTER — Telehealth: Payer: Self-pay | Admitting: Adult Health

## 2020-11-27 DIAGNOSIS — R160 Hepatomegaly, not elsewhere classified: Secondary | ICD-10-CM

## 2020-11-27 DIAGNOSIS — K7469 Other cirrhosis of liver: Secondary | ICD-10-CM

## 2020-11-27 DIAGNOSIS — Z8619 Personal history of other infectious and parasitic diseases: Secondary | ICD-10-CM

## 2020-11-27 NOTE — Telephone Encounter (Signed)
Pt called in requesting next steps after the Mri result. Pt stated that Dr. Claudina Lick order Mri of abdomen and Pt received results. Pt stated that Dr. Derrel Nip advise Pt that he will need medication for the findings in the Mri. Pt is calling to see if he still needs care or if he needs to have another image done. Pt would like call back. Cal back number for Pt is [phone]

## 2020-11-28 NOTE — Telephone Encounter (Signed)
Do not see any documentation of this in the chart. Did you speak with the pt?

## 2020-11-28 NOTE — Telephone Encounter (Signed)
Spoke with pt and he stated that he is scheduled for a follow up with Dr. Derrel Nip on the MRI that was done in 03/2020. Would like to discuss the need for the 6 month follow up MRI and any referrals for further evaluation. Pt stated that he has never seen Dr. Allen Norris.

## 2020-11-29 NOTE — Telephone Encounter (Signed)
Spoke with pt and scheduled his lab appt. Pt's MRI has already been scheduled and his appt with Dr. Derrel Nip has been moved to after his MRI. Pt is aware of all appt dates and times.

## 2020-12-06 NOTE — Telephone Encounter (Signed)
Pt states he received a letter stating his initial MRI has a different finding than what it was ordered for. Pt is wanting to know what was found and what are the next steps as far as what he needs to do. Pt is aware he has a second MRI scheduled and a lab appt scheduled.   [phone]

## 2020-12-08 ENCOUNTER — Other Ambulatory Visit: Payer: Self-pay | Admitting: Family Medicine

## 2020-12-08 DIAGNOSIS — K5900 Constipation, unspecified: Secondary | ICD-10-CM

## 2020-12-11 ENCOUNTER — Other Ambulatory Visit: Payer: Self-pay

## 2020-12-11 ENCOUNTER — Other Ambulatory Visit (INDEPENDENT_AMBULATORY_CARE_PROVIDER_SITE_OTHER): Payer: Medicaid Other

## 2020-12-11 DIAGNOSIS — Z8619 Personal history of other infectious and parasitic diseases: Secondary | ICD-10-CM

## 2020-12-11 DIAGNOSIS — R160 Hepatomegaly, not elsewhere classified: Secondary | ICD-10-CM

## 2020-12-11 DIAGNOSIS — K7469 Other cirrhosis of liver: Secondary | ICD-10-CM

## 2020-12-11 NOTE — Addendum Note (Signed)
Addended by: Leeanne Rio on: 12/11/2020 02:26 PM   Modules accepted: Orders

## 2020-12-12 LAB — COMPREHENSIVE METABOLIC PANEL
ALT: 10 U/L (ref 0–53)
AST: 26 U/L (ref 0–37)
Albumin: 4.5 g/dL (ref 3.5–5.2)
Alkaline Phosphatase: 67 U/L (ref 39–117)
BUN: 7 mg/dL (ref 6–23)
CO2: 28 mEq/L (ref 19–32)
Calcium: 9.7 mg/dL (ref 8.4–10.5)
Chloride: 102 mEq/L (ref 96–112)
Creatinine, Ser: 0.72 mg/dL (ref 0.40–1.50)
GFR: 99.58 mL/min (ref >60.00)
Glucose, Bld: 85 mg/dL (ref 70–99)
Potassium: 3.9 mEq/L (ref 3.5–5.1)
Sodium: 137 mEq/L (ref 135–145)
Total Bilirubin: 0.3 mg/dL (ref 0.2–1.2)
Total Protein: 7.6 g/dL (ref 6.0–8.3)

## 2020-12-12 LAB — HCV RNA QUANT: Hepatitis C Quantitation: NOT DETECTED IU/mL

## 2020-12-12 LAB — AFP TUMOR MARKER: AFP, Serum, Tumor Marker: 4.9 ng/mL (ref 0.0–8.4)

## 2020-12-14 NOTE — Telephone Encounter (Signed)
Spoke with pt and he is aware that the MRI results will be discussed with him at his appt next week unless there is something urgent and then she will call him and let him know. Pt gave a verbal understanding.

## 2020-12-15 ENCOUNTER — Ambulatory Visit: Payer: Medicaid Other | Admitting: Internal Medicine

## 2020-12-18 ENCOUNTER — Ambulatory Visit
Admission: RE | Admit: 2020-12-18 | Discharge: 2020-12-18 | Disposition: A | Discharge status: Home / Self Care | Payer: Medicaid Other | Source: Ambulatory Visit | Attending: Internal Medicine | Admitting: Internal Medicine

## 2020-12-18 ENCOUNTER — Other Ambulatory Visit: Payer: Self-pay

## 2020-12-18 DIAGNOSIS — R160 Hepatomegaly, not elsewhere classified: Secondary | ICD-10-CM | POA: Diagnosis not present

## 2020-12-18 DIAGNOSIS — I700 Atherosclerosis of aorta: Secondary | ICD-10-CM | POA: Insufficient documentation

## 2020-12-18 DIAGNOSIS — K7469 Other cirrhosis of liver: Secondary | ICD-10-CM | POA: Diagnosis present

## 2020-12-18 MED ORDER — GADOBUTROL 1 MMOL/ML IV SOLN
6.0000 mL | Freq: Once | INTRAVENOUS | Status: AC | PRN
  Administered 2020-12-18: 6 mL via INTRAVENOUS

## 2020-12-19 DIAGNOSIS — I700 Atherosclerosis of aorta: Secondary | ICD-10-CM | POA: Insufficient documentation

## 2020-12-20 ENCOUNTER — Encounter: Payer: Self-pay | Admitting: Internal Medicine

## 2020-12-20 ENCOUNTER — Encounter: Payer: Self-pay | Admitting: Adult Health

## 2020-12-20 ENCOUNTER — Ambulatory Visit (INDEPENDENT_AMBULATORY_CARE_PROVIDER_SITE_OTHER): Payer: Medicaid Other | Admitting: Internal Medicine

## 2020-12-20 VITALS — Ht 70.0 in | Wt 155.0 lb

## 2020-12-20 DIAGNOSIS — K732 Chronic active hepatitis, not elsewhere classified: Secondary | ICD-10-CM | POA: Diagnosis not present

## 2020-12-20 DIAGNOSIS — R5383 Other fatigue: Secondary | ICD-10-CM | POA: Diagnosis not present

## 2020-12-20 DIAGNOSIS — Z8619 Personal history of other infectious and parasitic diseases: Secondary | ICD-10-CM | POA: Diagnosis not present

## 2020-12-20 DIAGNOSIS — I700 Atherosclerosis of aorta: Secondary | ICD-10-CM

## 2020-12-20 DIAGNOSIS — K5900 Constipation, unspecified: Secondary | ICD-10-CM

## 2020-12-20 DIAGNOSIS — F203 Undifferentiated schizophrenia: Secondary | ICD-10-CM

## 2020-12-20 MED ORDER — PEG 3350 17 G PO PACK: PACK | ORAL | 4 refills | Status: DC

## 2020-12-20 MED ORDER — ROSUVASTATIN CALCIUM 10 MG PO TABS: 10.0000 mg | ORAL_TABLET | Freq: Every day | ORAL | 2 refills | Status: DC

## 2020-12-20 NOTE — Assessment & Plan Note (Addendum)
Treated  for 2 months with Mavret .  Marland Kitchen  Repeat viral load negative Nov 2022.  Will continue to monitor for one year.

## 2020-12-20 NOTE — Progress Notes (Addendum)
Telephone  Note  This visit type was conducted due to national recommendations for restrictions regarding the COVID-19 pandemic (e.g. social distancing).  This format is felt to be most appropriate for this patient at this time.  All issues noted in this document were discussed and addressed.  No physical exam was performed (except for noted visual exam findings with Video Visits).   I connected withNAME@ on 12/20/20 at  2:30 PM EST by telephone and verified that I am speaking with the correct person using two identifiers. Location patient: home Location provider: work or home office Persons participating in the virtual visit: patient, provider  I discussed the limitations, risks, security and privacy concerns of performing an evaluation and management service by telephone and the availability of in person appointments. I also discussed with the patient that there may be a patient responsible charge related to this service. The patient expressed understanding and agreed to proceed.  Reason for visit: follow up on MRI abdomen ,  hepatitis C , tobacco abuse and depression  HPI:  60 YR OLD MALE WITH HISTORY OF HEPATITIS C TREATED  in June-July with Mavret presents for follow up  1) cc today is of fatigue occurring with exertion for the past month.  Denies chest pain .  Sleeping well.  Started taking b12 supplements last 2 weeks after reading about b 12 benefits.  .  Smoking,    2) Hepatitis C:  treat,et naive, received treatment by Dr.  Allen Norris with  2 months of mavret.  Recent liver enzymes,  MRI liver and  viral load  reviewed.   dilated common duct:  stable, liver enzymes normal, no RUQ pain. Marland Kitchen    MIR IMPRESSION: 1. Similar 5 mm focus of arterial phase hyperenhancement within the hepatic dome. No delayed washout or enhancing capsule. In the setting of chronic hepatitis C without cirrhosis, LI-RADS criteria do not apply. This most likely represents a perfusion anomaly. This can be re-evaluated  on routine hepatitis C surveillance MRI. 2.  Possible constipation. 3. Similar mild common duct dilatation without intrahepatic duct dilatation. Consider correlation with bilirubin levels, if not already performed. 4.  Aortic Atherosclerosis (ICD10-I70.0).  3) Aortic atherosclerosis :  Discussed need for statin therapy given documented evidence of moderate  atherosclerosis in the aorta noted on recent    MRI of abdomen and  pelvis and the prognostic implications of this finding.   He is willing to begin therapy and return for LFTs in 3 weeks   Lab Results  Component Value Date   ALT 10 12/11/2020   AST 26 12/11/2020   ALKPHOS 67 12/11/2020   BILITOT 0.3 12/11/2020   3) Aortic atherosclerosis :  Discussed need for statin therapy given documented evidence of moderate  atherosclerosis in the aorta noted on recent  CT of abdomen and  pelvis and the prognostic implications of this finding.    ROS: See pertinent positives and negatives per HPI.  Past Medical History:  Diagnosis Date   Anxiety    Arthritis    BPH (benign prostatic hyperplasia)    C2 cervical fracture ([place]) 06/12/2018   Chronic pain    Closed displaced supracondylar fracture of distal end of right femur with intracondylar extension ([place]) 06/10/2018   Closed fracture of distal end of right femur ([place]) 06/09/2018   Closed left hip fracture, initial encounter ([place]) 08/18/2018   Depression    Hepatitis C    Paranoid schizophrenia ([place])    Sleep apnea  Past Surgical History:  Procedure Laterality Date   COLONOSCOPY WITH PROPOFOL N/A 05/18/2020   Procedure: COLONOSCOPY WITH PROPOFOL;  Surgeon: [name], MD;  [facility];  Service: Endoscopy;  Laterality: N/A;   FRACTURE SURGERY     HIP PINNING,CANNULATED Left 08/18/2018   Procedure: CANNULATED HIP PINNING, Right knee aspiration;  Surgeon: [place], MD;  Location: [facility] ORS;  Service: Orthopedics;  Laterality: Left;   JOINT REPLACEMENT     ORIF FEMUR  FRACTURE Right 06/10/2018   Procedure: OPEN REDUCTION INTERNAL FIXATION (ORIF) DISTAL FEMUR FRACTURE;  Surgeon: Shona Needles, MD;  Location: [facility];  Service: Orthopedics;  Laterality: Right;    Family History  Problem Relation Age of Onset   Cancer Mother    Diabetes Mother    Diabetes Paternal Aunt    Lung cancer Paternal Uncle     SOCIAL HX:  reports that he has been smoking cigarettes. He has a 20.00 pack-year smoking history. He has never used smokeless tobacco. He reports that he does not currently use alcohol. He reports that he does not currently use drugs.    Current Outpatient Medications:    docusate sodium (COLACE) 100 MG capsule, Take 1 capsule (100 mg total) by mouth 2 (two) times daily., Disp: 60 capsule, Rfl: 1   Polyethylene Glycol 3350 (PEG 3350) 17 g PACK, MIX ONE PACKET IN LIQUID AND DRINK BY MOUTH ONCE DAILY AS NEEDED FOR MODERATE OR SEVERE CONSTIPATION, Disp: 30 each, Rfl: 4   QUEtiapine (SEROQUEL) 200 MG tablet, Take 1 tablet (200 mg total) by mouth at bedtime., Disp: 30 tablet, Rfl: 1   rosuvastatin (CRESTOR) 10 MG tablet, Take 1 tablet (10 mg total) by mouth daily., Disp: 30 tablet, Rfl: 2   traZODone (DESYREL) 100 MG tablet, Take 1 tablet (100 mg total) by mouth at bedtime., Disp: 30 tablet, Rfl: 1   Omega-3 Fatty Acids (FISH OIL) 1000 MG CAPS, Take by mouth. (Patient not taking: Reported on 12/20/2020), Disp: , Rfl:   EXAM:   General impression: alert, cooperative and articulate.  No signs of being in distress  Lungs: speech is fluent sentence length suggests that patient is not short of breath and not punctuated by cough, sneezing or sniffing. Marland Kitchen   Psych: affect normal.  speech is articulate and non pressured .  Denies suicidal thoughts    ASSESSMENT AND PLAN:  Discussed the following assessment and plan:  Other fatigue - Plan: Comprehensive metabolic panel, CBC with Differential/Platelet, TSH, Vitamin B12  Chronic active hepatitis  (HCC)  Constipation, unspecified constipation type - Plan: Polyethylene Glycol 3350 (PEG 3350) 17 g PACK  History of hepatitis C  Undifferentiated schizophrenia ([place])  Aortic atherosclerosis ([place])  Chronic active hepatitis ([place]) UNCLEAR WHO TREATED PATIENT between April and Move[place] 2022 , but viral load is now undetectabel.   History of hepatitis C Treated  for 2 months with Mavret .  Marland Kitchen  Repeat viral load negative Nov 2022.  Will continue to monitor for one year.   Fatigue Etiology unclear.  He is a smoker, has AA on MRI.  Screening labs ordered. Follow up with MP  Schizophrenia [facility]) Managed by MD at [facility].  With olanzipine and seroquel  Aortic atherosclerosis ([place])  Patient is willing to  Initiate statin therapy starting with 10 mg rosuvastatin n  once daily and return in 3 weeks for LFTs   I discussed the assessment and treatment plan with the patient. The patient was provided an opportunity to ask questions and  all were answered. The patient agreed with the plan and demonstrated an understanding of the instructions.   The patient was advised to call back or seek an in-person evaluation if the symptoms worsen or if the condition fails to improve as anticipated.   I spent 30 minutes dedicated to the care of this patient on the date of this encounter to include pre-visit review of his medical history,  non  Face-to-face time with the patient , and post visit ordering of testing and therapeutics.    Crecencio Mc, MD

## 2020-12-20 NOTE — Assessment & Plan Note (Addendum)
Etiology unclear.  He is a smoker, has AA on MRI.  Screening labs ordered. Follow up with MP

## 2020-12-20 NOTE — Assessment & Plan Note (Signed)
Managed by MD at [facility].  With olanzipine and seroquel

## 2020-12-20 NOTE — Assessment & Plan Note (Signed)
Patient is willing to  Initiate statin therapy starting with 10 mg rosuvastatin n  once daily and return in 3 weeks for LFTs

## 2020-12-20 NOTE — Assessment & Plan Note (Signed)
UNCLEAR WHO TREATED PATIENT between April and [place] 2022 , but viral load is now undetectabel.

## 2021-01-08 ENCOUNTER — Other Ambulatory Visit (INDEPENDENT_AMBULATORY_CARE_PROVIDER_SITE_OTHER): Payer: Medicaid Other

## 2021-01-08 ENCOUNTER — Other Ambulatory Visit: Payer: Self-pay

## 2021-01-08 DIAGNOSIS — R5383 Other fatigue: Secondary | ICD-10-CM

## 2021-01-09 LAB — COMPREHENSIVE METABOLIC PANEL
ALT: 9 U/L (ref 0–53)
AST: 15 U/L (ref 0–37)
Albumin: 4.3 g/dL (ref 3.5–5.2)
Alkaline Phosphatase: 64 U/L (ref 39–117)
BUN: 14 mg/dL (ref 6–23)
CO2: 29 mEq/L (ref 19–32)
Calcium: 9.4 mg/dL (ref 8.4–10.5)
Chloride: 102 mEq/L (ref 96–112)
Creatinine, Ser: 0.71 mg/dL (ref 0.40–1.50)
GFR: 99.95 mL/min (ref >60.00)
Glucose, Bld: 77 mg/dL (ref 70–99)
Potassium: 4 mEq/L (ref 3.5–5.1)
Sodium: 139 mEq/L (ref 135–145)
Total Bilirubin: 0.3 mg/dL (ref 0.2–1.2)
Total Protein: 7 g/dL (ref 6.0–8.3)

## 2021-01-09 LAB — CBC WITH DIFFERENTIAL/PLATELET
Basophils Absolute: 0.1 10*3/uL (ref 0.0–0.1)
Basophils Relative: 0.7 % (ref 0.0–3.0)
Eosinophils Absolute: 0.3 10*3/uL (ref 0.0–0.7)
Eosinophils Relative: 3 % (ref 0.0–5.0)
HCT: 37.9 % — ABNORMAL LOW (ref 39.0–52.0)
Hemoglobin: 13 g/dL (ref 13.0–17.0)
Lymphocytes Relative: 23.2 % (ref 12.0–46.0)
Lymphs Abs: 2.3 10*3/uL (ref 0.7–4.0)
MCHC: 34.3 g/dL (ref 30.0–36.0)
MCV: 93.6 fl (ref 78.0–100.0)
Monocytes Absolute: 0.5 10*3/uL (ref 0.1–1.0)
Monocytes Relative: 5.2 % (ref 3.0–12.0)
Neutro Abs: 6.8 10*3/uL (ref 1.4–7.7)
Neutrophils Relative %: 67.9 % (ref 43.0–77.0)
Platelets: 213 10*3/uL (ref 150.0–400.0)
RBC: 4.05 Mil/uL — ABNORMAL LOW (ref 4.22–5.81)
RDW: 12.4 % (ref 11.5–15.5)
WBC: 10.1 10*3/uL (ref 4.0–10.5)

## 2021-01-09 LAB — TSH: TSH: 1.97 u[IU]/mL (ref 0.35–5.50)

## 2021-01-09 LAB — VITAMIN B12: Vitamin B-12: 723 pg/mL (ref 211–911)

## 2021-01-11 ENCOUNTER — Telehealth: Payer: Self-pay | Admitting: Adult Health

## 2021-01-11 NOTE — Telephone Encounter (Signed)
Patient returned office phone call for lab results. 

## 2021-01-15 ENCOUNTER — Other Ambulatory Visit: Payer: Self-pay

## 2021-01-15 ENCOUNTER — Encounter: Payer: Self-pay | Admitting: Adult Health

## 2021-01-15 ENCOUNTER — Ambulatory Visit (INDEPENDENT_AMBULATORY_CARE_PROVIDER_SITE_OTHER): Payer: Medicaid Other | Admitting: Adult Health

## 2021-01-15 VITALS — BP 148/96 | HR 67 | Temp 95.5°F | Ht 70.0 in | Wt 160.4 lb

## 2021-01-15 DIAGNOSIS — I10 Essential (primary) hypertension: Secondary | ICD-10-CM

## 2021-01-15 DIAGNOSIS — H539 Unspecified visual disturbance: Secondary | ICD-10-CM | POA: Diagnosis not present

## 2021-01-15 DIAGNOSIS — F17209 Nicotine dependence, unspecified, with unspecified nicotine-induced disorders: Secondary | ICD-10-CM

## 2021-01-15 DIAGNOSIS — E559 Vitamin D deficiency, unspecified: Secondary | ICD-10-CM

## 2021-01-15 DIAGNOSIS — R5383 Other fatigue: Secondary | ICD-10-CM | POA: Diagnosis not present

## 2021-01-15 DIAGNOSIS — Z8619 Personal history of other infectious and parasitic diseases: Secondary | ICD-10-CM

## 2021-01-15 MED ORDER — HYDROCHLOROTHIAZIDE 12.5 MG PO CAPS: 12.5000 mg | ORAL_CAPSULE | Freq: Every day | ORAL | 0 refills | Status: DC

## 2021-01-15 NOTE — Patient Instructions (Signed)
Fatigue If you have fatigue, you feel tired all the time and have a lack of energy or a lack of motivation. Fatigue may make it difficult to start or complete tasks because of exhaustion. In general, occasional or mild fatigue is often a normal response to activity or life. However, long-lasting (chronic) or extreme fatigue may be a symptom of a medical condition. Follow these instructions at home: General instructions Watch your fatigue for any changes. Go to bed and get up at the same time every day. Avoid fatigue by pacing yourself during the day and getting enough sleep at night. Maintain a healthy weight. Medicines Take over-the-counter and prescription medicines only as told by your health care provider. Take a multivitamin, if told by your health care provider.  Do not use herbal or dietary supplements unless they are approved by your health care provider. Activity  Exercise regularly, as told by your health care provider. Use or practice techniques to help you relax, such as yoga, tai chi, meditation, or massage therapy. Eating and drinking  Avoid heavy meals in the evening. Eat a well-balanced diet, which includes lean proteins, whole grains, plenty of fruits and vegetables, and low-fat dairy products. Avoid consuming too much caffeine. Avoid the use of alcohol. Drink enough fluid to keep your urine pale yellow. Lifestyle Change situations that cause you stress. Try to keep your work and personal schedule in balance. Do not use any products that contain nicotine or tobacco, such as cigarettes and e-cigarettes. If you need help quitting, ask your health care provider. Do not use drugs. Contact a health care provider if: Your fatigue does not get better. You have a fever. You suddenly lose or gain weight. You have headaches. You have trouble falling asleep or sleeping through the night. You feel angry, guilty, anxious, or sad. You are unable to have a bowel movement  (constipation). Your skin is dry. You have swelling in your legs or another part of your body. Get help right away if: You feel confused. Your vision is blurry. You feel faint or you pass out. You have a severe headache. You have severe pain in your abdomen, your back, or the area between your waist and hips (pelvis). You have chest pain, shortness of breath, or an irregular or fast heartbeat. You are unable to urinate, or you urinate less than normal. You have abnormal bleeding, such as bleeding from the rectum, vagina, nose, lungs, or nipples. You vomit blood. You have thoughts about hurting yourself or others. If you ever feel like you may hurt yourself or others, or have thoughts about taking your own life, get help right away. You can go to your nearest emergency department or call: Your local emergency services (911 in the [place] A suicide crisis helpline, such as the [facility] at 303-519-8567 or 988 in the [place]. This is open 24 hours a day. Summary If you have fatigue, you feel tired all the time and have a lack of energy or a lack of motivation. Fatigue may make it difficult to start or complete tasks because of exhaustion. Long-lasting (chronic) or extreme fatigue may be a symptom of a medical condition. Exercise regularly, as told by your health care provider. Change situations that cause you stress. Try to keep your work and personal schedule in balance. This information is not intended to replace advice given to you by your health care provider. Make sure you discuss any questions you have with your health care provider. Document Revised:  08/09/2020 Document Reviewed: 11/25/2019 Elsevier Patient Education  2022 [place]. Hypertension, Adult Hypertension is another name for high blood pressure. High blood pressure forces your heart to work harder to pump blood. This can cause problems over time. There are two numbers in a blood pressure reading.  There is a top number (systolic) over a bottom number (diastolic). It is best to have a blood pressure that is below 120/80. Healthy choices can help lower your blood pressure, or you may need medicine to help lower it. What are the causes? The cause of this condition is not known. Some conditions may be related to high blood pressure. What increases the risk? Smoking. Having type 2 diabetes mellitus, high cholesterol, or both. Not getting enough exercise or physical activity. Being overweight. Having too much fat, sugar, calories, or salt (sodium) in your diet. Drinking too much alcohol. Having long-term (chronic) kidney disease. Having a family history of high blood pressure. Age. Risk increases with age. Race. You may be at higher risk if you are African American. Gender. Men are at higher risk than women before age 86. After age 7, women are at higher risk than men. Having obstructive sleep apnea. Stress. What are the signs or symptoms? High blood pressure may not cause symptoms. Very high blood pressure (hypertensive crisis) may cause: Headache. Feelings of worry or nervousness (anxiety). Shortness of breath. Nosebleed. A feeling of being sick to your stomach (nausea). Throwing up (vomiting). Changes in how you see. Very bad chest pain. Seizures. How is this treated? This condition is treated by making healthy lifestyle changes, such as: Eating healthy foods. Exercising more. Drinking less alcohol. Your health care provider may prescribe medicine if lifestyle changes are not enough to get your blood pressure under control, and if: Your top number is above 130. Your bottom number is above 80. Your personal target blood pressure may vary. Follow these instructions at home: Eating and drinking  If told, follow the DASH eating plan. To follow this plan: Fill one half of your plate at each meal with fruits and vegetables. Fill one fourth of your plate at each meal with  whole grains. Whole grains include whole-wheat pasta, brown rice, and whole-grain bread. Eat or drink low-fat dairy products, such as skim milk or low-fat yogurt. Fill one fourth of your plate at each meal with low-fat (lean) proteins. Low-fat proteins include fish, chicken without skin, eggs, beans, and tofu. Avoid fatty meat, cured and processed meat, or chicken with skin. Avoid pre-made or processed food. Eat less than 1,500 mg of salt each day. Do not drink alcohol if: Your doctor tells you not to drink. You are pregnant, may be pregnant, or are planning to become pregnant. If you drink alcohol: Limit how much you use to: 0-1 drink a day for women. 0-2 drinks a day for men. Be aware of how much alcohol is in your drink. In the [place]., one drink equals one 12 oz bottle of beer (355 mL), one 5 oz glass of wine (148 mL), or one 1 oz glass of hard liquor (44 mL). Lifestyle  Work with your doctor to stay at a healthy weight or to lose weight. Ask your doctor what the best weight is for you. Get at least 30 minutes of exercise most days of the week. This may include walking, swimming, or biking. Get at least 30 minutes of exercise that strengthens your muscles (resistance exercise) at least 3 days a week. This may include lifting weights or doing  Pilates. Do not use any products that contain nicotine or tobacco, such as cigarettes, e-cigarettes, and chewing tobacco. If you need help quitting, ask your doctor. Check your blood pressure at home as told by your doctor. Keep all follow-up visits as told by your doctor. This is important. Medicines Take over-the-counter and prescription medicines only as told by your doctor. Follow directions carefully. Do not skip doses of blood pressure medicine. The medicine does not work as well if you skip doses. Skipping doses also puts you at risk for problems. Ask your doctor about side effects or reactions to medicines that you should watch for. Contact a  doctor if you: Think you are having a reaction to the medicine you are taking. Have headaches that keep coming back (recurring). Feel dizzy. Have swelling in your ankles. Have trouble with your vision. Get help right away if you: Get a very bad headache. Start to feel mixed up (confused). Feel weak or numb. Feel faint. Have very bad pain in your: Chest. Belly (abdomen). Throw up more than once. Have trouble breathing. Summary Hypertension is another name for high blood pressure. High blood pressure forces your heart to work harder to pump blood. For most people, a normal blood pressure is less than 120/80. Making healthy choices can help lower blood pressure. If your blood pressure does not get lower with healthy choices, you may need to take medicine. This information is not intended to replace advice given to you by your health care provider. Make sure you discuss any questions you have with your health care provider. Document Revised: 09/24/2017 Document Reviewed: 09/24/2017 Elsevier Patient Education  [place].

## 2021-01-15 NOTE — Progress Notes (Signed)
Established Patient Office Visit  Subjective:  Patient ID: Richard Vasquez, male    DOB: September 20, 1960  Age: 60 y.o. MRN: [id number]  CC:  Chief Complaint  Patient presents with   Follow-up    HPI NEFI MUSICH presents for follow up pm fatigue that started in October of this year, started taking B12. Still smoking/  Hepatitis history treated by GI Dr. Allen Norris woth 2 months of [place]. Has had viral loads and MRI by GI.  He is still fatigued. No worse no better.  He has about to quit smoking was smoking 3 ppd and now doing 6 filtered cigarettes per day.   Started on statin as aortic arteriosclerosis on MRI of abdomen and groin.  Here for LFT recheck. Denies any joint pains.  Blood pressure documented to be elevated on the last 2 previous readings.  Patient does not check readings at home denies any headache.  He has been having more fatigue recently.  Patient  denies any fever, body aches,chills, rash, chest pain, shortness of breath, nausea, vomiting, or diarrhea.  Denies dizziness, lightheadedness, pre syncopal or syncopal episodes.    Past Medical History:  Diagnosis Date   Anxiety    Arthritis    BPH (benign prostatic hyperplasia)    C2 cervical fracture (HCC) 06/12/2018   Chronic pain    Closed displaced supracondylar fracture of distal end of right femur with intracondylar extension ([place]) 06/10/2018   Closed fracture of distal end of right femur ([place]) 06/09/2018   Closed left hip fracture, initial encounter ([place]) 08/18/2018   Depression    Hepatitis C    Paranoid schizophrenia ([place])    Sleep apnea     Past Surgical History:  Procedure Laterality Date   COLONOSCOPY WITH PROPOFOL N/A 05/18/2020   Procedure: COLONOSCOPY WITH PROPOFOL;  Surgeon: [name], MD;  Location: [facility] ENDOSCOPY;  Service: Endoscopy;  Laterality: N/A;   FRACTURE SURGERY     HIP PINNING,CANNULATED Left 08/18/2018   Procedure: CANNULATED HIP PINNING, Right knee aspiration;  Surgeon: Thornton Park,  MD;  Location: [facility] ORS;  Service: Orthopedics;  Laterality: Left;   JOINT REPLACEMENT     ORIF FEMUR FRACTURE Right 06/10/2018   Procedure: OPEN REDUCTION INTERNAL FIXATION (ORIF) DISTAL FEMUR FRACTURE;  Surgeon: Shona Needles, MD;  Location: [facility];  Service: Orthopedics;  Laterality: Right;    Family History  Problem Relation Age of Onset   Cancer Mother    Diabetes Mother    Diabetes Paternal Aunt    Lung cancer Paternal Uncle     Social History   Socioeconomic History   Marital status: Divorced    Spouse name: Not on file   Number of children: Not on file   Years of education: Not on file   Highest education level: Not on file  Occupational History   Not on file  Tobacco Use   Smoking status: Some Days    Packs/day: 1.00    Years: 20.00    Pack years: 20.00    Types: Cigarettes   Smokeless tobacco: Never  Vaping Use   Vaping Use: Never used  Substance and Sexual Activity   Alcohol use: Not Currently   Drug use: Not Currently   Sexual activity: Not on file  Other Topics Concern   Not on file  Social History Narrative   Not on file   Social Determinants of Health   Financial Resource Strain: Not on file  Food Insecurity: Not on file  Transportation Needs:  Not on file  Physical Activity: Not on file  Stress: Not on file  Social Connections: Not on file  Intimate Partner Violence: Not on file    Outpatient Medications Prior to Visit  Medication Sig Dispense Refill   docusate sodium (COLACE) 100 MG capsule Take 1 capsule (100 mg total) by mouth 2 (two) times daily. 60 capsule 1   Omega-3 Fatty Acids (FISH OIL) 1000 MG CAPS Take by mouth.     Polyethylene Glycol 3350 (PEG 3350) 17 g PACK MIX ONE PACKET IN LIQUID AND DRINK BY MOUTH ONCE DAILY AS NEEDED FOR MODERATE OR SEVERE CONSTIPATION 30 each 4   QUEtiapine (SEROQUEL) 200 MG tablet Take 1 tablet (200 mg total) by mouth at bedtime. 30 tablet 1   rosuvastatin (CRESTOR) 10 MG tablet Take 1 tablet (10 mg total)  by mouth daily. 30 tablet 2   traZODone (DESYREL) 100 MG tablet Take 1 tablet (100 mg total) by mouth at bedtime. 30 tablet 1   No facility-administered medications prior to visit.    No Known Allergies  ROS Review of Systems  Constitutional:  Positive for fatigue. Negative for activity change, appetite change, chills, diaphoresis, fever and unexpected weight change.  HENT: Negative.    Respiratory: Negative.    Cardiovascular: Negative.   Gastrointestinal: Negative.   Genitourinary: Negative.   Neurological: Negative.   Hematological: Negative.   Psychiatric/Behavioral: Negative.       Objective:    Physical Exam Constitutional:      General: He is not in acute distress.    Appearance: Normal appearance. He is well-developed. He is not ill-appearing, toxic-appearing or diaphoretic.  HENT:     Head: Normocephalic and atraumatic.     Right Ear: Hearing, tympanic membrane, ear canal and external ear normal.     Left Ear: Hearing, tympanic membrane, ear canal and external ear normal.     Nose: Nose normal.     Mouth/Throat:     Mouth: Mucous membranes are moist.     Pharynx: Uvula midline. No oropharyngeal exudate.  Eyes:     General: Lids are normal. No scleral icterus.       Right eye: No discharge.        Left eye: No discharge.     Conjunctiva/sclera: Conjunctivae normal.     Pupils: Pupils are equal, round, and reactive to light.  Neck:     Thyroid: No thyromegaly.     Vascular: Normal carotid pulses. No carotid bruit, hepatojugular reflux or JVD.     Trachea: Trachea and phonation normal. No tracheal tenderness or tracheal deviation.     Meningeal: Brudzinski's sign absent.  Cardiovascular:     Rate and Rhythm: Normal rate and regular rhythm.     Pulses: Normal pulses.     Heart sounds: Normal heart sounds, S1 normal and S2 normal. Heart sounds not distant. No murmur heard.   No friction rub. No gallop.  Pulmonary:     Effort: Pulmonary effort is normal. No  accessory muscle usage or respiratory distress.     Breath sounds: Normal breath sounds. No stridor. No wheezing, rhonchi or rales.  Chest:     Chest wall: No tenderness.  Abdominal:     General: Bowel sounds are normal. There is no distension.     Palpations: Abdomen is soft. There is no mass.     Tenderness: There is no abdominal tenderness. There is no guarding or rebound.     Hernia: No hernia is present.  Musculoskeletal:        General: No tenderness or deformity. Normal range of motion.     Cervical back: Full passive range of motion without pain, normal range of motion and neck supple.  Lymphadenopathy:     Head:     Right side of head: No submental, submandibular, tonsillar, preauricular, posterior auricular or occipital adenopathy.     Left side of head: No submental, submandibular, tonsillar, preauricular, posterior auricular or occipital adenopathy.     Cervical: No cervical adenopathy.  Skin:    General: Skin is warm and dry.     Coloration: Skin is not pale.     Findings: No erythema or rash.     Nails: There is no clubbing.  Neurological:     Mental Status: He is alert and oriented to person, place, and time.     GCS: GCS eye subscore is 4. GCS verbal subscore is 5. GCS motor subscore is 6.     Cranial Nerves: No cranial nerve deficit.     Sensory: No sensory deficit.     Motor: No abnormal muscle tone.     Coordination: Coordination normal.     Deep Tendon Reflexes: Reflexes are normal and symmetric. Reflexes normal.  Psychiatric:        Speech: Speech normal.        Behavior: Behavior normal.        Thought Content: Thought content normal.        Judgment: Judgment normal.    BP (!) 148/96    Pulse 67    Temp (!) 95.5 F (35.3 C)    Ht 5\' 10"  (1.778 m)    Wt 160 lb 6.4 oz (72.8 kg)    SpO2 96%    BMI 23.02 kg/m  Wt Readings from Last 3 Encounters:  01/15/21 160 lb 6.4 oz (72.8 kg)  12/20/20 155 lb (70.3 kg)  05/18/20 162 lb (73.5 kg)   Vitals with BMI  01/15/2021 12/20/2020 05/18/2020  Height 5\' 10"  5\' 10"  -  Weight 160 lbs 6 oz 155 lbs -  BMI 96.22 29.79 -  Systolic 892 - 119  Diastolic 96 - 92  Pulse 67 - 89  Some encounter information is confidential and restricted. Go to Review Flowsheets activity to see all data.      Health Maintenance Due  Topic Date Due   COVID-19 Vaccine (1) Never done   Pneumococcal Vaccine 47-72 Years old (1 - PCV) Never done   Zoster Vaccines- Shingrix (1 of 2) Never done    There are no preventive care reminders to display for this patient.  Lab Results  Component Value Date   TSH 1.97 01/08/2021   Lab Results  Component Value Date   WBC 10.1 01/08/2021   HGB 13.0 01/08/2021   HCT 37.9 (L) 01/08/2021   MCV 93.6 01/08/2021   PLT 213.0 01/08/2021   Lab Results  Component Value Date   NA 136 01/15/2021   K 4.5 01/15/2021   CO2 29 01/15/2021   GLUCOSE 87 01/15/2021   BUN 12 01/15/2021   CREATININE 0.67 01/15/2021   BILITOT 0.3 01/15/2021   ALKPHOS 59 01/15/2021   AST 15 01/15/2021   ALT 9 01/15/2021   PROT 7.0 01/15/2021   ALBUMIN 4.2 01/15/2021   CALCIUM 9.9 01/15/2021   ANIONGAP 4 (L) 08/20/2018   GFR 101.70 01/15/2021   Lab Results  Component Value Date   CHOL 179 03/20/2020   Lab Results  Component Value Date  HDL 34 (L) 03/20/2020   Lab Results  Component Value Date   LDLCALC 125 (H) 03/20/2020   Lab Results  Component Value Date   TRIG 109 03/20/2020   Lab Results  Component Value Date   CHOLHDL 5.3 (H) 03/20/2020   Lab Results  Component Value Date   HGBA1C 5.9 01/15/2021      Assessment & Plan:   Problem List Items Addressed This Visit       Digestive   History of hepatitis C   Relevant Orders   AMB Referral to Community Care Coordinaton     Other   Fatigue   Relevant Orders   Comprehensive metabolic panel (Completed)   Hemoglobin A1c (Completed)   CT CHEST LUNG CA SCREEN LOW DOSE W/O CM   AMB Referral to [facility]    Other Visit Diagnoses     Primary hypertension    -  Primary   Relevant Orders   Comprehensive metabolic panel   AMB Referral to Community Care Coordinaton   Vitamin D deficiency       Relevant Orders   VITAMIN D 25 Hydroxy (Vit-D Deficiency, Fractures) (Completed)   Vision changes       Relevant Orders   Ambulatory referral to Ophthalmology   AMB Referral to Community Care Coordinaton   Tobacco use disorder, continuous       Relevant Orders   CT CHEST LUNG CA SCREEN LOW DOSE W/O CM   AMB Referral to [place] ordered this encounter  Medications   : hydrochlorothiazide (MICROZIDE) 12.5 MG capsule    Sig: Take 1 capsule (12.5 mg total) by mouth daily.    Dispense:  90 capsule    Refill:  0  Discussed hypertension and will start hydrochlorothiazide 12.5 mg, will need recheck of CMP in 4 to 6 weeks.  Monitor blood pressure at home and call if any blood pressures above or below parameters normal given.  Patient reports he needs an eye exam, has had some visual changes denies any pain in his eyes or redness.  Denies any eye injury or trauma, he would just like a referral to ophthalmology.  This was placed to [facility] for an eye exam.  History of vitamin D deficiency in the past we will check vitamin D level as well given his fatigue.  Continuous tobacco use disorder, he does report that he has been decreasing his tobacco use he was previously smoking 3 packs a day is out of 6 filtered cigarettes daily.  He agrees to [facility] referral for help with social work, pharmacy and Chief Strategy Officer.  Patient has been homeless in the past he reports he is now living in a new area. Follow-up: Return in about 3 months (around 04/15/2021), or if symptoms worsen or fail to improve, for at any time for any worsening symptoms, Go to Emergency room/ urgent care if worse.    Marcille Buffy, FNP

## 2021-01-16 ENCOUNTER — Telehealth: Payer: Self-pay | Admitting: Adult Health

## 2021-01-16 ENCOUNTER — Encounter: Payer: Self-pay | Admitting: Adult Health

## 2021-01-16 ENCOUNTER — Other Ambulatory Visit: Payer: Self-pay | Admitting: Adult Health

## 2021-01-16 DIAGNOSIS — E559 Vitamin D deficiency, unspecified: Secondary | ICD-10-CM

## 2021-01-16 LAB — COMPREHENSIVE METABOLIC PANEL
ALT: 9 U/L (ref 0–53)
AST: 15 U/L (ref 0–37)
Albumin: 4.2 g/dL (ref 3.5–5.2)
Alkaline Phosphatase: 59 U/L (ref 39–117)
BUN: 12 mg/dL (ref 6–23)
CO2: 29 mEq/L (ref 19–32)
Calcium: 9.9 mg/dL (ref 8.4–10.5)
Chloride: 100 mEq/L (ref 96–112)
Creatinine, Ser: 0.67 mg/dL (ref 0.40–1.50)
GFR: 101.7 mL/min (ref >60.00)
Glucose, Bld: 87 mg/dL (ref 70–99)
Potassium: 4.5 mEq/L (ref 3.5–5.1)
Sodium: 136 mEq/L (ref 135–145)
Total Bilirubin: 0.3 mg/dL (ref 0.2–1.2)
Total Protein: 7 g/dL (ref 6.0–8.3)

## 2021-01-16 LAB — VITAMIN D 25 HYDROXY (VIT D DEFICIENCY, FRACTURES): VITD: 32.16 ng/mL (ref 30.00–100.00)

## 2021-01-16 LAB — HEMOGLOBIN A1C: Hgb A1c MFr Bld: 5.9 % (ref 4.6–6.5)

## 2021-01-16 MED ORDER — VITAMIN D (ERGOCALCIFEROL) 1.25 MG (50000 UNIT) PO CAPS: 50000.0000 [IU] | ORAL_CAPSULE | ORAL | 0 refills | Status: DC

## 2021-01-16 MED ORDER — HYDROCHLOROTHIAZIDE 12.5 MG PO CAPS
12.5000 mg | ORAL_CAPSULE | Freq: Every day | ORAL | 0 refills | Status: DC
Start: 2021-01-16 — End: 2021-03-22

## 2021-01-16 NOTE — Progress Notes (Signed)
Meds ordered this encounter  Medications   Vitamin D, Ergocalciferol, (DRISDOL) 1.25 MG (50000 UNIT) CAPS capsule    Sig: Take 1 capsule (50,000 Units total) by mouth every 7 (seven) days. (taking one tablet per week) schedule lab in office 1-2 weeks after completing prescription.    Dispense:  12 capsule    Refill:  0    Vitamin D deficiency - Plan: VITAMIN D 25 Hydroxy (Vit-D Deficiency, Fractures)

## 2021-01-16 NOTE — Progress Notes (Signed)
Hemoglobin A1C no diabetes. CMP within normal limits.  Vitamin D is low end normal, will have him start prescription vitamin D as prescribed one tablet weekly, recheck vitamin D in 3 months.

## 2021-01-16 NOTE — Telephone Encounter (Signed)
Pt called in stating that medication (hydrochlorothiazide (MICROZIDE) 12.5 MG capsule) was sent to the incorrect pharmacy. Pt is requesting for another prescription to be sent to [facility] on [address] in [place]. Pt requesting callback

## 2021-01-17 ENCOUNTER — Other Ambulatory Visit: Payer: Self-pay | Admitting: Adult Health

## 2021-01-17 ENCOUNTER — Telehealth: Payer: Self-pay

## 2021-01-17 DIAGNOSIS — F203 Undifferentiated schizophrenia: Secondary | ICD-10-CM

## 2021-01-17 DIAGNOSIS — I700 Atherosclerosis of aorta: Secondary | ICD-10-CM

## 2021-01-17 DIAGNOSIS — Z8619 Personal history of other infectious and parasitic diseases: Secondary | ICD-10-CM

## 2021-01-17 DIAGNOSIS — I10 Essential (primary) hypertension: Secondary | ICD-10-CM

## 2021-01-17 DIAGNOSIS — H539 Unspecified visual disturbance: Secondary | ICD-10-CM

## 2021-01-17 NOTE — Progress Notes (Signed)
Orders Placed This Encounter  Procedures   AMB Referral to [facility]

## 2021-01-17 NOTE — Chronic Care Management (AMB) (Signed)
°  Care Management   Note  01/17/2021 Name: Richard Vasquez MRN: [id number] DOB: 1961-01-24  QUADRY KAMPA is a 60 y.o. year old male who is a primary care patient of Flinchum, Kelby Aline, FNP. I reached out to [name] by phone today in response to a referral sent by Mr. Olajuwon Fosdick Brenn's primary care provider.   Mr. Sallade was given information about care management services today including:  Care management services include personalized support from designated clinical staff supervised by his physician, including individualized plan of care and coordination with other care providers 24/7 contact phone numbers for assistance for urgent and routine care needs. The patient may stop care management services at any time by phone call to the office staff.  Patient agreed to services and verbal consent obtained.   Follow up plan: Telephone appointment with care management team member scheduled for: Pharm D 01/31/2021 RN CM 02/05/2021 LCSW 02/19/2021  Noreene Larsson, [address], [place], [place] [address] Direct Dial: [phone] [email or web address]

## 2021-01-18 ENCOUNTER — Telehealth: Payer: Self-pay | Admitting: *Deleted

## 2021-01-18 ENCOUNTER — Telehealth: Payer: Medicaid Other | Admitting: *Deleted

## 2021-01-18 ENCOUNTER — Other Ambulatory Visit: Payer: Self-pay

## 2021-01-18 ENCOUNTER — Telehealth: Payer: Self-pay

## 2021-01-18 DIAGNOSIS — I700 Atherosclerosis of aorta: Secondary | ICD-10-CM

## 2021-01-18 DIAGNOSIS — F203 Undifferentiated schizophrenia: Secondary | ICD-10-CM

## 2021-01-18 MED ORDER — VITAMIN D (ERGOCALCIFEROL) 1.25 MG (50000 UNIT) PO CAPS: 50000.0000 [IU] | ORAL_CAPSULE | ORAL | 0 refills | Status: DC
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
## 2021-01-18 NOTE — Telephone Encounter (Signed)
° °  Telephone encounter was:  Unsuccessful.  01/18/2021 Name: Richard Vasquez MRN: [id number] DOB: 1960-04-05  Unsuccessful outbound call made today to assist with:  Transportation Needs , Food Insecurity, and Caregiver Stress  Outreach Attempt:  1st Attempt  A HIPAA compliant voice message was left requesting a return call.  Instructed patient to call back at.j   Instructed patient to call back at [phone]  at their earliest convenience. . .j

## 2021-01-18 NOTE — Telephone Encounter (Signed)
Opened in error

## 2021-01-23 ENCOUNTER — Telehealth: Payer: Self-pay | Admitting: *Deleted

## 2021-01-23 NOTE — Telephone Encounter (Signed)
° °  Telephone encounter was:  Successful.  01/23/2021 Name: Richard Vasquez MRN: [id number] DOB: [birth year]  Richard Vasquez is a 60 y.o. year old male who is a primary care patient of Flinchum, Kelby Aline, FNP . The community resource team was consulted for assistance with Food InsecurityPatient is feeling fine and presently  housed in a good place no issues with community reources offered utility food and others , patient had no needs provided phone number and closed referral  Care guide performed the following interventions: Patient provided with information about care guide support team and interviewed to confirm resource needs.  Follow Up Plan:  No further follow up planned at this time. The patient has been provided with needed resources. [place], Care Management  [phone] [address] , [place] [address] Email : [email or web address]

## 2021-01-23 NOTE — Telephone Encounter (Signed)
° °  Telephone encounter was:  Unsuccessful.  01/23/2021 Name: KAHLIN MARK MRN: [id number] DOB: 01-10-1961  Unsuccessful outbound call made today to assist with:  Transportation Needs  and Food Insecurity  Outreach Attempt:  1st Attempt Patient has voicemail not setup    [place] , [place], Care Management  [phone] [address] , [place] [address] Email : [email or web address]

## 2021-01-31 ENCOUNTER — Other Ambulatory Visit: Payer: Self-pay

## 2021-01-31 ENCOUNTER — Ambulatory Visit (INDEPENDENT_AMBULATORY_CARE_PROVIDER_SITE_OTHER): Payer: Medicaid Other | Admitting: Family

## 2021-01-31 ENCOUNTER — Ambulatory Visit: Payer: Medicaid Other

## 2021-01-31 ENCOUNTER — Encounter: Payer: Self-pay | Admitting: Family

## 2021-01-31 ENCOUNTER — Telehealth: Payer: Self-pay | Admitting: Pharmacist

## 2021-01-31 DIAGNOSIS — J40 Bronchitis, not specified as acute or chronic: Secondary | ICD-10-CM | POA: Insufficient documentation

## 2021-01-31 MED ORDER — ROSUVASTATIN CALCIUM 10 MG PO TABS: 10.0000 mg | ORAL_TABLET | Freq: Every day | ORAL | 2 refills | Status: DC

## 2021-01-31 MED ORDER — OSELTAMIVIR PHOSPHATE 75 MG PO CAPS: 75.0000 mg | ORAL_CAPSULE | Freq: Two times a day (BID) | ORAL | 0 refills | Status: DC

## 2021-01-31 MED ORDER — ALBUTEROL SULFATE HFA 108 (90 BASE) MCG/ACT IN AERS: 2.0000 | INHALATION_SPRAY | Freq: Four times a day (QID) | RESPIRATORY_TRACT | 0 refills | Status: DC | PRN

## 2021-01-31 NOTE — Patient Instructions (Signed)
Use albuterol every 6 hours for first 24 hours to get good medication into the lungs and loosen congestion; after, you may use as needed and eventually stop all together when cough resolves.  Start tamiflu Most common side effects are nausea, vomiting, diarrhea.  Flu may be able to be spread to others 1 day prior to symptom onset and up to 5 to 7 days.  Please remain in quarantine until you are 24 hours without fever and WITHOUT medication such as Tylenol or ibuprofen.  You may return to work/school once you are fever free per above for 24 hours and all symptoms nearly resolved.  It is reasonable to take further precautions especially if you are around young children, or anyone that is immunocompromised, including wearing a mask, informing  those around you, and extending quarantine.   If you develop shortness of breath, chest pain, palpitations, worsening fever, or inability to stay hydrated, please call 911 or report to the nearest emergency room.  Influenza, Adult Influenza is also called "the flu." It is an infection in the lungs, nose, and throat (respiratory tract). It spreads easily from person to person (is contagious). The flu causes symptoms that are like a cold, along with high fever and body aches. What are the causes? This condition is caused by the influenza virus. You can get the virus by: Breathing in droplets that are in the air after a person infected with the flu coughed or sneezed. Touching something that has the virus on it and then touching your mouth, nose, or eyes. What increases the risk? Certain things may make you more likely to get the flu. These include: Not washing your hands often. Having close contact with many people during cold and flu season. Touching your mouth, eyes, or nose without first washing your hands. Not getting a flu shot every year. You may have a higher risk for the flu, and serious problems, such as a lung infection (pneumonia), if you: Are  older than 65. Are pregnant. Have a weakened disease-fighting system (immune system) because of a disease or because you are taking certain medicines. Have a long-term (chronic) condition, such as: Heart, kidney, or lung disease. Diabetes. Asthma. Have a liver disorder. Are very overweight (morbidly obese). Have anemia. What are the signs or symptoms? Symptoms usually begin suddenly and last 4-14 days. They may include: Fever and chills. Headaches, body aches, or muscle aches. Sore throat. Cough. Runny or stuffy (congested) nose. Feeling discomfort in your chest. Not wanting to eat as much as normal. Feeling weak or tired. Feeling dizzy. Feeling sick to your stomach or throwing up. How is this treated? If the flu is found early, you can be treated with antiviral medicine. This can help to reduce how bad the illness is and how long it lasts. This may be given by mouth or through an IV tube. Taking care of yourself at home can help your symptoms get better. Your doctor may want you to: Take over-the-counter medicines. Drink plenty of fluids. The flu often goes away on its own. If you have very bad symptoms or other problems, you may be treated in a hospital. Follow these instructions at home:   Activity Rest as needed. Get plenty of sleep. Stay home from work or school as told by your doctor. Do not leave home until you do not have a fever for 24 hours without taking medicine. Leave home only to go to your doctor. Eating and drinking Take an ORS (oral rehydration solution).  This is a drink that is sold at pharmacies and stores. Drink enough fluid to keep your pee pale yellow. Drink clear fluids in small amounts as you are able. Clear fluids include: Water. Ice chips. Fruit juice mixed with water. Low-calorie sports drinks. Eat bland foods that are easy to digest. Eat small amounts as you are able. These foods include: Bananas. Applesauce. Rice. Lean  meats. Toast. Crackers. Do not eat or drink: Fluids that have a lot of sugar or caffeine. Alcohol. Spicy or fatty foods. General instructions Take over-the-counter and prescription medicines only as told by your doctor. Use a cool mist humidifier to add moisture to the air in your home. This can make it easier for you to breathe. When using a cool mist humidifier, clean it daily. Empty water and replace with clean water. Cover your mouth and nose when you cough or sneeze. Wash your hands with soap and water often and for at least 20 seconds. This is also important after you cough or sneeze. If you cannot use soap and water, use alcohol-based hand sanitizer. Keep all follow-up visits. How is this prevented?  Get a flu shot every year. You may get the flu shot in late summer, fall, or winter. Ask your doctor when you should get your flu shot. Avoid contact with people who are sick during fall and winter. This is cold and flu season. Contact a doctor if: You get new symptoms. You have: Chest pain. Watery poop (diarrhea). A fever. Your cough gets worse. You start to have more mucus. You feel sick to your stomach. You throw up. Get help right away if you: Have shortness of breath. Have trouble breathing. Have skin or nails that turn a bluish color. Have very bad pain or stiffness in your neck. Get a sudden headache. Get sudden pain in your face or ear. Cannot eat or drink without throwing up. These symptoms may represent a serious problem that is an emergency. Get medical help right away. Call your local emergency services (911 in the [place] Do not wait to see if the symptoms will go away. Do not drive yourself to the hospital. Summary Influenza is also called "the flu." It is an infection in the lungs, nose, and throat. It spreads easily from person to person. Take over-the-counter and prescription medicines only as told by your doctor. Getting a flu shot every year is the best  way to not get the flu. This information is not intended to replace advice given to you by your health care provider. Make sure you discuss any questions you have with your health care provider. Document Revised: 09/03/2019 Document Reviewed: 09/03/2019 Elsevier Patient Education  [place]. Oseltamivir Capsules What is this medication? OSELTAMIVIR (os el TAM i vir) prevents and treats infections caused by the flu virus (influenza). It works by slowing the spread of the flu virus in your body and reducing how long your symptoms last. It will not treat colds or infections caused by bacteria or other viruses. It will not replace the annual flu vaccine. This medicine may be used for other purposes; ask your health care provider or pharmacist if you have questions. COMMON BRAND NAME(S): Tamiflu What should I tell my care team before I take this medication? They need to know if you have any of the following conditions: Difficulty swallowing Kidney disease An unusual or allergic reaction to oseltamivir, other medications, foods, dyes, or preservatives Pregnant or trying to get pregnant Breast-feeding How should I use this  medication? Take this medication by mouth with water. Take it as directed on the prescription label at the same time every day. You can take it with or without food. If it upsets your stomach, take it with food. Take all of this medication unless your care team tells you to stop it early. Keep taking it even if you think you are better. When taking whole capsules: Do not cut, crush or chew this medication. Swallow the capsules whole. When mixing capsule contents with liquid: Open the capsule and mix the contents in a small bowl with a small amount of a sweetened liquid such as chocolate syrup, corn syrup, caramel topping, or light brown sugar (dissolved in water). Stir the mixture and take the dose right away after mixing. Talk to your care team about the use of this  medication in children. While it may be prescribed for selected conditions, precautions do apply. Overdosage: If you think you have taken too much of this medicine contact a poison control center or emergency room at once. NOTE: This medicine is only for you. Do not share this medicine with others. What if I miss a dose? If you miss a dose, take it as soon as you remember. If it is almost time for your next dose (within 2 hours), take only that dose. Do not take double or extra doses. What may interact with this medication? Intranasal influenza vaccine This list may not describe all possible interactions. Give your health care provider a list of all the medicines, herbs, non-prescription drugs, or dietary supplements you use. Also tell them if you smoke, drink alcohol, or use illegal drugs. Some items may interact with your medicine. What should I watch for while using this medication? Visit your care team for regular checks on your progress. Tell your care team if your symptoms do not start to get better or if they get worse. If you have the flu, you may be at an increased risk of developing seizures, confusion, or abnormal behavior. This occurs early in the illness, and more frequently in children and teens. These events are not common, but may result in accidental injury to the patient. Families and caregivers of patients should watch for signs of unusual behavior and contact a care team right away if the patient shows signs of unusual behavior. To treat the flu, start taking this medication within 2 days of getting flu symptoms. This medication is not a substitute for the flu shot. Talk to your care team each year about an annual flu shot. What side effects may I notice from receiving this medication? Side effects that you should report to your care team as soon as possible: Allergic reactions--skin rash, itching, hives, swelling of the face, lips, tongue, or  throat Confusion Hallucinations Redness, blistering, peeling, or loosening of the skin, including inside the mouth Seizures Tremors or shaking Trouble speaking Unusual changes in behavior Side effects that usually do not require medical attention (report to your care team if they continue or are bothersome): Headache Nausea Vomiting This list may not describe all possible side effects. Call your doctor for medical advice about side effects. You may report side effects to FDA at 1-800-FDA-1088. Where should I keep my medication? Keep out of the reach of children and pets. Store at room temperature between 15 and 30 degrees C (59 and 86 degrees F). Throw away any unused medication after the expiration date. NOTE: This sheet is a summary. It may not cover all possible information. If  you have questions about this medicine, talk to your doctor, pharmacist, or health care provider.  2022 Elsevier/Gold Standard (2020-10-03 00:00:00)

## 2021-01-31 NOTE — Telephone Encounter (Signed)
Spoke with patient for scheduled CCM visit. 3 days ago, onset of congestion with some chills and body aches. He requests a visit to talk to a provider. Discussed with Arnett, scheduled telephone visit for this afternoon.   He notes he has a home COVID test that he will take before the visit.   Rescheduled CCM call for next week

## 2021-01-31 NOTE — Assessment & Plan Note (Signed)
Difficult to assess patient on the telephone.  He was not labored in his speech and I did not hear him cough.  He was talking in full sentences. Due to transportation issues he is unable to come in the office for in person exam today nor come into or tomorrow as I encouraged to be formally tested for URI.  I even advised we could do combined RSV, COVID and influenza PCR swab.  He is not sure if his motor scooter will be ready tomorrow and feels most comfortable with starting empiric treatment.  Discussed most likely he has a viral illness.  At home COVID test was negative, and I have greater concern for influenza and particularly with other comorbidities including longstanding history of smoking, CAD.  In this setting, I have sent in Tamiflu and also provided him with an inhaler.  Advised him to let us know with any worsening symptoms which may warrant in person evaluation.

## 2021-01-31 NOTE — Telephone Encounter (Signed)
Noted  

## 2021-01-31 NOTE — Chronic Care Management (AMB) (Signed)
°  Chronic Care Management   Note  01/31/2021 Name: Richard Vasquez MRN: [id number] DOB: 1960/04/06   Called patient for CCM visit, he reported acute URI symptoms. Scheduled with visit with Richard Vasquez. Rescheduled CCM visit.   Catie Darnelle Maffucci, PharmD, [place], [place] Clinical Pharmacist [employer] [facility] [employer] [phone]

## 2021-01-31 NOTE — Progress Notes (Signed)
Verbal consent for services obtained from patient prior to services given to TELEPHONE visit:   Location of call:  provider at work patient at home  Names of all persons present for services: Mable Paris, NP and patient Chief complaint:   Acute visit Chief complaint productive cough and congestion x 3 days, unchanged.  He also endorses body aches chills.  Denies tactile warmth.  Productive cough with green mucus.  COVID test negative today.  He feels slight SOB when 'getting up to do something' . No leg swelling, CP.   No antibiotic in last 3 months.  He has tried mucinex with some relief.   Following with social work. Has been homeless in the past.   H/o schizophrenia - followed by [facility].   Former smoker. He quit 4 days ago. He had been smoking 6-8 cig per day. Prior he had smoked 2 packs per day. He smoked for 25 years.   He has a motor scooter but it is not working and in the shop.    A/P/next steps:  Problem List Items Addressed This Visit       Respiratory   Bronchitis - Primary    Difficult to assess patient on the telephone.  He was not labored in his speech and I did not hear him cough.  He was talking in full sentences. Due to transportation issues he is unable to come in the office for in person exam today nor come into or tomorrow as I encouraged to be formally tested for URI.  I even advised we could do combined RSV, COVID and influenza PCR swab.  He is not sure if his motor scooter will be ready tomorrow and feels most comfortable with starting empiric treatment.  Discussed most likely he has a viral illness.  At home COVID test was negative, and I have greater concern for influenza and particularly with other comorbidities including longstanding history of smoking, CAD.  In this setting, I have sent in Tamiflu and also provided him with an inhaler.  Advised him to let us know with any worsening symptoms which may warrant in person evaluation.      Relevant  Medications   oseltamivir (TAMIFLU) 75 MG capsule   albuterol (VENTOLIN HFA) 108 (90 Base) MCG/ACT inhaler     I spent 15 min  discussing plan of care over the phone.

## 2021-02-05 ENCOUNTER — Ambulatory Visit: Payer: Medicaid Other | Admitting: *Deleted

## 2021-02-05 NOTE — Patient Instructions (Signed)
Visit Information ? ?Thank you for allowing me to share the care management and care coordination services that are available to you as part of your health plan and services through your primary care provider and medical home. Please reach out to me at [phone]   if the care management/care coordination team may be of assistance to you in the future.  ? ?Shima Compere RN, MSN ?RN Care Management Coordinator ?[facility] ?[phone] ?[email or web address] ? ?

## 2021-02-05 NOTE — Chronic Care Management (AMB) (Signed)
°  Care Management   Follow Up Note   02/05/2021 Name: Richard Vasquez MRN: [id number] DOB: 1960/12/04   Referred by: Doreen Beam, FNP Reason for referral : Chronic Care Management (INITIAL)   Successful contact was made with the patient to discuss care management and care coordination services. Patient declines engagement at this time. RNCM introduced  self, role, and program. Declines RNCM  Outreaches at this time,  Follow Up Plan: No further follow up required: as patient declined RNCM DOLLOW UP,  Hubert Azure RN, MSN RN Care Management Coordinator [place] [phone] [email or web address]

## 2021-02-08 ENCOUNTER — Ambulatory Visit: Payer: Medicaid Other | Admitting: Pharmacist

## 2021-02-08 ENCOUNTER — Telehealth: Payer: Self-pay | Admitting: Pharmacist

## 2021-02-08 DIAGNOSIS — E559 Vitamin D deficiency, unspecified: Secondary | ICD-10-CM

## 2021-02-08 DIAGNOSIS — F203 Undifferentiated schizophrenia: Secondary | ICD-10-CM

## 2021-02-08 DIAGNOSIS — I700 Atherosclerosis of aorta: Secondary | ICD-10-CM

## 2021-02-08 DIAGNOSIS — I10 Essential (primary) hypertension: Secondary | ICD-10-CM

## 2021-02-08 NOTE — Telephone Encounter (Signed)
Thanks for update catie  FYI mclean, michelle  Visit less than optimal as he was a phone call last week  Judson Roch, call pt  He will need in person follow up If sob were persistent, worsening in any way or symptoms in general worsening,  he would need to go to UC today for in person eval. He may require CXR. Concern with h/o smoking and undiagnosed COPD that he could have complicated course Please sch f/u with provider in our clinic; if nothing in the next couple of days, I would advise UC per the above reason  I am out of office next week

## 2021-02-08 NOTE — Telephone Encounter (Signed)
Patient had telephone visit with Mable Paris last week, treated as flu. He is completing Tamiflu prescription, he was also able to pick up albuterol inhaler and is using 3-4 times daily. He reports continued fatigue, body aches, congestion, and a decreased appetite that has not seemed to improve much in this past week.   Reports improvement in SOB with the use of albuterol. Reports he is taking ibuprofen every 5-6 hours for body aches. On our call today, he did not sound in any respiratory distress.   We discussed that it can take several weeks to get "back to normal" after a respiratory infection, however, routing to PCP, treating provider, and doctor of the day to see if any other recommendations or work up suggestions.

## 2021-02-08 NOTE — Telephone Encounter (Signed)
Agree with plan and noted. UC advised.

## 2021-02-08 NOTE — Progress Notes (Signed)
Chief Complaint  Patient presents with   Care Coordination    Follow Up    Richard Vasquez is a 61 y.o. year old male who was referred for medication management by their primary care provider, Flinchum, Kelby Aline, FNP. They presented for a telephone visit in the context of the COVID-19 pandemic.    Subjective:  Acute Needs: - Telephone visit last week for respiratory infection evaluation by Mable Paris, treated as flu. Patient has transportation concerns and was unable to get to office for testing. He is taking tamiflu twice daily and albuterol HFA PRN Q4-6 hours with improvement. He was not in distress on our call today, but indicates that he is still fatigued, still has body aches, green congestion, and a decreased appetite. He is taking ibuprofen every 5-6 hours for body aches. Routing update to PCP, Arnett, and provider of the day   SDOH: - Denies food access concerns.  - Discussed Medicaid transportation. He reports he has a Care Guide phone number that he will call to discuss transportation benefits - Reports he can afford Medicaid copays for his medications. He has a friend that takes him to pick up medicatoins  Hypertension:  Current medications: HCTZ 12.5 mg daily  Current blood pressure readings readings: does not have a way of checking at home  Patient denies hypotensive s/sx including dizziness, lightheadedness.   Depression  Current medications: olanzapine 5 mg daily, quetiapine 200 mg daily in the evening, trazodone 100 mg in the evenings  Current medication access support: denies cost concerns  Hyperlipidemia with Aortic Atherosclerosis: Current medications: rosuvastatin 10 mg daily   Health Maintenance  Yearly influenza vaccination: due - previously declined  Td/Tdap vaccination: up to date Pneumonia vaccination: due - will discuss moving forward COVID vaccinations: due - will discuss moving forward Shingrix vaccinations: due - will discuss  moving forward Colonoscopy: up to date   Objective: Lab Results  Component Value Date   HGBA1C 5.9 01/15/2021    Lab Results  Component Value Date   CREATININE 0.67 01/15/2021   BUN 12 01/15/2021   NA 136 01/15/2021   K 4.5 01/15/2021   CL 100 01/15/2021   CO2 29 01/15/2021    Lab Results  Component Value Date   CHOL 179 03/20/2020   HDL 34 (L) 03/20/2020   LDLCALC 125 (H) 03/20/2020   TRIG 109 03/20/2020   CHOLHDL 5.3 (H) 03/20/2020    Medications Reviewed Today     Reviewed by De Hollingshead, RPH-CPP (Pharmacist) on 02/08/21 at 51  Med List Status: <None>   Medication Order Taking? Sig Documenting Provider Last Dose Status Informant  albuterol (VENTOLIN HFA) 108 (90 Base) MCG/ACT inhaler [id number] Yes Inhale 2 puffs into the lungs every 6 (six) hours as needed for wheezing or shortness of breath. Burnard Hawthorne, FNP Taking Active   Ascorbic Acid (VITAMIN C) 100 MG tablet [id number] Yes Take 100 mg by mouth daily. [provider] Taking Active   docusate sodium (COLACE) 100 MG capsule [id number] Yes Take 1 capsule (100 mg total) by mouth 2 (two) times daily. Flinchum, Kelby Aline, FNP Taking Active   hydrochlorothiazide (MICROZIDE) 12.5 MG capsule [id number] Yes Take 1 capsule (12.5 mg total) by mouth daily. Flinchum, Kelby Aline, FNP Taking Active   OLANZapine (ZYPREXA) 5 MG tablet [id number] Yes Take 5 mg by mouth every morning. [provider] Taking Active   Omega-3 Fatty Acids (FISH OIL) 1000 MG CAPS [id number] Yes Take by  mouth. [provider] Taking Active   oseltamivir (TAMIFLU) 75 MG capsule [id number] Yes Take 1 capsule (75 mg total) by mouth 2 (two) times daily. Burnard Hawthorne, FNP Taking Active   Polyethylene Glycol 3350 (PEG 3350) 17 g PACK [id number]  MIX ONE PACKET IN LIQUID AND DRINK BY MOUTH ONCE DAILY AS NEEDED FOR MODERATE OR SEVERE CONSTIPATION Crecencio Mc, MD  Active   QUEtiapine (SEROQUEL) 200 MG tablet [id number]  Yes Take 1 tablet (200 mg total) by mouth at bedtime. Bettey Costa, MD Taking Active   rosuvastatin (CRESTOR) 10 MG tablet [id number] Yes Take 1 tablet (10 mg total) by mouth daily. Flinchum, Kelby Aline, FNP Taking Active   traZODone (DESYREL) 100 MG tablet [id number] Yes Take 1 tablet (100 mg total) by mouth at bedtime. Bettey Costa, MD Taking Active   Vitamin D, Ergocalciferol, (DRISDOL) 1.25 MG (50000 UNIT) CAPS capsule [id number] Yes Take 1 capsule (50,000 Units total) by mouth every 7 (seven) days. (taking one tablet per week) schedule lab in office 1-2 weeks after completing prescription. Flinchum, Kelby Aline, FNP Taking Active            Assessment/Plan:   Hypertension: - Currently unknown - Will investigate coverage for BP machines w/ Medicaid - Recommend to continue current regimen at this time  Hyperlipidemia/ASCVD Risk Reduction: - Currently unknown - Recommend to continue rosuvastatin 10 mg daily. Recommend to recheck lipid panel at upcoming fasting lab appointment.    Depression/Anxiety: - Currently controlled per patient report  - Reviewed behavioral health support strategies including upcoming LCSW appointment - Recommend to continue current regimen at this time  Health Maintenance - Recommended COVID booster, influenza vaccination, Shingrix vaccination moving forward.   Follow Up Plan: visit in 4-6 weeks for BP   Catie Darnelle Maffucci, PharmD, [place], CPP Clinical Pharmacist [employer] [facility] [employer] [facility] [phone]

## 2021-02-08 NOTE — Telephone Encounter (Signed)
FYI when I spoke with patient he stated that he felt that he was getting some better. He stated to me that SOB was better & better using albuterol inhaler. He is still have a lot of chest congestion & I recommended that he take the plain Mucinex in the blue packaging with lots of water. No appointments available here in office tomorrow & patient stated that he had no way of getting anywhere today anyway. I was able to schedule him at [facility] across the road tomorrow at 2p. I advised that he may need chest xray & they can do xray there. Pt was advised if any worsening symptoms or SOB he needed to go to ED or call 911.

## 2021-02-08 NOTE — Telephone Encounter (Signed)
Noted  

## 2021-02-09 ENCOUNTER — Ambulatory Visit: Payer: Medicaid Other

## 2021-02-12 ENCOUNTER — Telehealth: Payer: Self-pay | Admitting: Adult Health

## 2021-02-12 NOTE — Telephone Encounter (Signed)
Rejection Reason - Patient did not respond" Arloa Koh said on Feb 08, 2021 11:03 AM  "Unable to lm. Calls not going through. " Marolyn Hammock Deel said on Jan 17, 2021 8:28 AM  Msg from [facility]

## 2021-02-12 NOTE — Telephone Encounter (Signed)
Noted patient did not go for opthamology exam as advised.

## 2021-02-19 ENCOUNTER — Telehealth: Payer: Medicaid Other

## 2021-03-07 ENCOUNTER — Telehealth: Payer: Self-pay | Admitting: *Deleted

## 2021-03-07 ENCOUNTER — Telehealth: Payer: Medicaid Other | Admitting: *Deleted

## 2021-03-07 NOTE — Telephone Encounter (Signed)
°  Care Management   Follow Up Note   03/07/2021 Name: GURDEEP KEESEY MRN: [id number] DOB: [birth year]   Referred by: Doreen Beam, FNP Reason for referral : Care Coordination   An unsuccessful telephone outreach was attempted today. The patient was referred to the case management team for assistance with care management and care coordination.   Follow Up Plan: Telephone follow up appointment with care management team member to be re-scheduled by Care[facility].  [address] [place] [phone]

## 2021-03-22 ENCOUNTER — Telehealth: Payer: Self-pay | Admitting: Pharmacist

## 2021-03-22 ENCOUNTER — Ambulatory Visit (INDEPENDENT_AMBULATORY_CARE_PROVIDER_SITE_OTHER): Payer: Medicaid Other | Admitting: Pharmacist

## 2021-03-22 ENCOUNTER — Other Ambulatory Visit: Payer: Self-pay

## 2021-03-22 VITALS — BP 138/82 | HR 72

## 2021-03-22 DIAGNOSIS — E559 Vitamin D deficiency, unspecified: Secondary | ICD-10-CM

## 2021-03-22 DIAGNOSIS — J40 Bronchitis, not specified as acute or chronic: Secondary | ICD-10-CM | POA: Diagnosis not present

## 2021-03-22 DIAGNOSIS — I10 Essential (primary) hypertension: Secondary | ICD-10-CM

## 2021-03-22 DIAGNOSIS — I700 Atherosclerosis of aorta: Secondary | ICD-10-CM | POA: Diagnosis not present

## 2021-03-22 MED ORDER — BLOOD PRESSURE MONITOR KIT: PACK | 1 refills | Status: DC

## 2021-03-22 MED ORDER — SPIRIVA RESPIMAT 2.5 MCG/ACT IN AERS: 2.0000 | INHALATION_SPRAY | Freq: Every day | RESPIRATORY_TRACT | 2 refills | Status: DC

## 2021-03-22 MED ORDER — ROSUVASTATIN CALCIUM 10 MG PO TABS: 10.0000 mg | ORAL_TABLET | Freq: Every day | ORAL | 1 refills | Status: DC

## 2021-03-22 MED ORDER — ALBUTEROL SULFATE HFA 108 (90 BASE) MCG/ACT IN AERS: 1.0000 | INHALATION_SPRAY | Freq: Four times a day (QID) | RESPIRATORY_TRACT | 11 refills | Status: DC | PRN

## 2021-03-22 MED ORDER — HYDROCHLOROTHIAZIDE 12.5 MG PO CAPS: 12.5000 mg | ORAL_CAPSULE | Freq: Every day | ORAL | 1 refills | Status: DC

## 2021-03-22 NOTE — Telephone Encounter (Signed)
Received message from office staff that patient left BP meter prescription here. Called patient back (at a friend's number as his phone died - [phone]). Left HIPAA compliant message noting that I would fax the script to his pharmacy.

## 2021-03-22 NOTE — Patient Instructions (Addendum)
Denim,   It was great talking to you today!  Continue hydrochlorothiazide 12.5 mg once daily for your blood pressure.   Continue rosuvastatin 10 mg once daily for your cholesterol.   Start Spiriva 2 puffs every morning for your breathing. You can continue to use the albuterol HFA rescue inhaler if needed.   Please call Dr. Dorothey Baseman office to schedule follow up regarding your indigestion - [phone]. Try Pepcid, Tums instead of Pepto Bismol.   Pick up an over the counter Vitamin D 400-800 units daily.   Thanks!  Catie Darnelle Maffucci, PharmD

## 2021-03-22 NOTE — Progress Notes (Addendum)
Chief Complaint  Patient presents with   Hypertension    Richard Vasquez is a 61 y.o. year old male who was referred for medication management by their primary care provider, Flinchum, Kelby Aline, FNP. They presented for a face to face visit today.    Subjective: Hypertension:  Current medications: HCTZ 12.5 mg daily   Current blood pressure readings readings: does not have a BP machine at home   Patient denies hypotensive s/sx including dizziness, lightheadedness.    Hyperlipidemia/ASCVD Risk Reduction  Current lipid lowering medications: rosuvastatin 10 mg daily - though reports he has been out of this medication for a few weeks  Shortness of breath/hx bronchitis  Current medications: albuterol HFA PRN  Medications tried in the past: none  Reports 1-2  exacerbations in the past year per his memory. Using albuterol HFA 2-3 times daily for shortness of breath. Reports continued green phlegm and periodic productive cough;   Reports he quit smoking last month  Acid Reflux: Reports periodic indigestion lately. Has been taking Pepto Bismol. Reports dark stools - no iron or multivitamins. Denies blood on the toilet paper, denies coughing up blood. Does report fatigue. Previously saw Dr. Allen Norris, colonoscopy 04/2020 with non cancerous polyps, repeat in 10 years.    Objective: Lab Results  Component Value Date   HGBA1C 5.9 01/15/2021    Lab Results  Component Value Date   CREATININE 0.67 01/15/2021   BUN 12 01/15/2021   NA 136 01/15/2021   K 4.5 01/15/2021   CL 100 01/15/2021   CO2 29 01/15/2021    Lab Results  Component Value Date   CHOL 179 03/20/2020   HDL 34 (L) 03/20/2020   LDLCALC 125 (H) 03/20/2020   TRIG 109 03/20/2020   CHOLHDL 5.3 (H) 03/20/2020    Medications Reviewed Today     Reviewed by De Hollingshead, RPH-CPP (Pharmacist) on 02/08/21 at 1505  Med List Status: <None>   Medication Order Taking? Sig Documenting Provider Last Dose Status  Informant  albuterol (VENTOLIN HFA) 108 (90 Base) MCG/ACT inhaler [id number] Yes Inhale 2 puffs into the lungs every 6 (six) hours as needed for wheezing or shortness of breath. Burnard Hawthorne, FNP Taking Active   Ascorbic Acid (VITAMIN C) 100 MG tablet [id number] Yes Take 100 mg by mouth daily. [provider] Taking Active   docusate sodium (COLACE) 100 MG capsule [id number] Yes Take 1 capsule (100 mg total) by mouth 2 (two) times daily. Flinchum, Kelby Aline, FNP Taking Active   hydrochlorothiazide (MICROZIDE) 12.5 MG capsule [id number] Yes Take 1 capsule (12.5 mg total) by mouth daily. Flinchum, Kelby Aline, FNP Taking Active   OLANZapine (ZYPREXA) 5 MG tablet [id number] Yes Take 5 mg by mouth every morning. [provider] Taking Active   Omega-3 Fatty Acids (FISH OIL) 1000 MG CAPS [id number] Yes Take by mouth. [provider] Taking Active   oseltamivir (TAMIFLU) 75 MG capsule [id number] Yes Take 1 capsule (75 mg total) by mouth 2 (two) times daily. Burnard Hawthorne, FNP Taking Active   Polyethylene Glycol 3350 (PEG 3350) 17 g PACK [id number]  MIX ONE PACKET IN LIQUID AND DRINK BY MOUTH ONCE DAILY AS NEEDED FOR MODERATE OR SEVERE CONSTIPATION Crecencio Mc, MD  Active   QUEtiapine (SEROQUEL) 200 MG tablet [id number] Yes Take 1 tablet (200 mg total) by mouth at bedtime. Bettey Costa, MD Taking Active   rosuvastatin (CRESTOR) 10 MG tablet [id number] Yes Take 1 tablet (10 mg  total) by mouth daily. Flinchum, Kelby Aline, FNP Taking Active   traZODone (DESYREL) 100 MG tablet [id number] Yes Take 1 tablet (100 mg total) by mouth at bedtime. Bettey Costa, MD Taking Active   Vitamin D, Ergocalciferol, (DRISDOL) 1.25 MG (50000 UNIT) CAPS capsule [id number] Yes Take 1 capsule (50,000 Units total) by mouth every 7 (seven) days. (taking one tablet per week) schedule lab in office 1-2 weeks after completing prescription. Flinchum, Kelby Aline, FNP Taking Active              Assessment/Plan:   Hypertension: - Currently uncontrolled in office but improved - Provided script for BP machine to take to the pharmacy. Follow home readings. Will defer medication changes at this time given adjustments in other conditions.   Hyperlipidemia/ASCVD Risk Reduction: - Currently uncontrolled.  - Reviewed long term complications of uncontrolled cholesterol - Recommend to restart rosuvastatin 10 mg daiy. Script sent to pharmacy. Recommend lipids in ~ 6-12 weeks.   Shortness of breath/hx bronchitis: - Currently uncontrolled.  - Discussed with provider. Start Spiriva 2.5 mcg 2 puffs every morning. Sample provided, script sent to pharmacy. Reviewed inhaler technique, patient administered first dose in clinic today. Provided refill on albuterol, counseled on concepts of maintenance vs reliever therapy.   Acid Reflux: - Discussed that Pepto Bismol likely reason for dark stools, though discussed with Dr. Terese Door. Ordered CBC, iron tests today given reports of fatigue. Encouraged patient to follow up with Dr. Allen Norris pending results.  - Discussed alternative OTC options such as H2RA or tums for acid reflux.   Follow Up Plan: PCP visit in ~ 4 weeks. Rescheduled lab appointment as patient could have all labs drawn together today.   Catie Darnelle Maffucci, PharmD, [place], [place] Clinical Pharmacist [employer] [facility] [employer] [phone]

## 2021-03-23 LAB — COMPREHENSIVE METABOLIC PANEL
ALT: 7 U/L (ref 0–53)
AST: 14 U/L (ref 0–37)
Albumin: 4.1 g/dL (ref 3.5–5.2)
Alkaline Phosphatase: 51 U/L (ref 39–117)
BUN: 8 mg/dL (ref 6–23)
CO2: 29 mEq/L (ref 19–32)
Calcium: 9.1 mg/dL (ref 8.4–10.5)
Chloride: 106 mEq/L (ref 96–112)
Creatinine, Ser: 0.7 mg/dL (ref 0.40–1.50)
GFR: 100.24 mL/min (ref >60.00)
Glucose, Bld: 95 mg/dL (ref 70–99)
Potassium: 4.2 mEq/L (ref 3.5–5.1)
Sodium: 139 mEq/L (ref 135–145)
Total Bilirubin: 0.3 mg/dL (ref 0.2–1.2)
Total Protein: 6.6 g/dL (ref 6.0–8.3)

## 2021-03-23 LAB — CBC WITH DIFFERENTIAL/PLATELET
Basophils Absolute: 0.1 10*3/uL (ref 0.0–0.1)
Basophils Relative: 0.8 % (ref 0.0–3.0)
Eosinophils Absolute: 0.6 10*3/uL (ref 0.0–0.7)
Eosinophils Relative: 6.3 % — ABNORMAL HIGH (ref 0.0–5.0)
HCT: 36.2 % — ABNORMAL LOW (ref 39.0–52.0)
Hemoglobin: 12.4 g/dL — ABNORMAL LOW (ref 13.0–17.0)
Lymphocytes Relative: 23.4 % (ref 12.0–46.0)
Lymphs Abs: 2.3 10*3/uL (ref 0.7–4.0)
MCHC: 34.1 g/dL (ref 30.0–36.0)
MCV: 93.4 fl (ref 78.0–100.0)
Monocytes Absolute: 0.5 10*3/uL (ref 0.1–1.0)
Monocytes Relative: 5.5 % (ref 3.0–12.0)
Neutro Abs: 6.2 10*3/uL (ref 1.4–7.7)
Neutrophils Relative %: 64 % (ref 43.0–77.0)
Platelets: 192 10*3/uL (ref 150.0–400.0)
RBC: 3.88 Mil/uL — ABNORMAL LOW (ref 4.22–5.81)
RDW: 13.6 % (ref 11.5–15.5)
WBC: 9.8 10*3/uL (ref 4.0–10.5)

## 2021-03-23 LAB — IBC + FERRITIN
Ferritin: 72.6 ng/mL (ref 22.0–322.0)
Iron: 51 ug/dL (ref 42–165)
Saturation Ratios: 14.9 % — ABNORMAL LOW (ref 20.0–50.0)
TIBC: 343 ug/dL (ref 250.0–450.0)
Transferrin: 245 mg/dL (ref 212.0–360.0)

## 2021-03-23 LAB — VITAMIN D 25 HYDROXY (VIT D DEFICIENCY, FRACTURES): VITD: 48.86 ng/mL (ref 30.00–100.00)

## 2021-03-26 ENCOUNTER — Telehealth: Payer: Self-pay

## 2021-03-26 NOTE — Progress Notes (Signed)
CMP and vitamin d within normal limits.

## 2021-03-26 NOTE — Telephone Encounter (Signed)
Pt advised need for GI visit.  Pt advised need for Multivit  Pt agreeable to all.

## 2021-04-02 ENCOUNTER — Ambulatory Visit: Payer: Self-pay | Admitting: Pharmacist

## 2021-04-02 NOTE — Chronic Care Management (AMB) (Signed)
?  Chronic Care Management  ? ?Note ? ?04/02/2021 ?Name: Richard Vasquez MRN: [id number] DOB: January 28, 1961 ? ? ? ?Closing pharmacy CCM case at this time.  Patient has clinic contact information for future questions or concerns.  ? ?Catie Darnelle Maffucci, PharmD, [place], CPP ?Clinical Pharmacist ?Therapist, music at [employer] ?[phone] ? ?

## 2021-04-18 ENCOUNTER — Other Ambulatory Visit: Payer: Medicaid Other

## 2021-04-18 ENCOUNTER — Encounter: Payer: Self-pay | Admitting: Adult Health

## 2021-04-18 ENCOUNTER — Ambulatory Visit (INDEPENDENT_AMBULATORY_CARE_PROVIDER_SITE_OTHER): Payer: Medicaid Other | Admitting: Adult Health

## 2021-04-18 DIAGNOSIS — K219 Gastro-esophageal reflux disease without esophagitis: Secondary | ICD-10-CM | POA: Diagnosis not present

## 2021-04-18 MED ORDER — PANTOPRAZOLE SODIUM 40 MG PO TBEC: 40.0000 mg | DELAYED_RELEASE_TABLET | Freq: Every day | ORAL | 0 refills | Status: DC

## 2021-04-18 NOTE — Progress Notes (Addendum)
Virtual Visit via Telephone Note ? ?I connected with [name] on 04/18/21 at  2:00 PM EDT by telephone and verified that I am speaking with the correct person using two identifiers. ? ?Location: ?Patient: at home  ?Provider: Provider: Provider's office at  [facility], [place]. ?  ?I discussed the limitations, risks, security and privacy concerns of performing an evaluation and management service by telephone and the availability of in person appointments. I also discussed with the patient that there may be a patient responsible charge related to this service. The patient expressed understanding and agreed to proceed. ? ? ?History of Present Illness: ? ? Patient is a 61 year old male in no acute distress, he is here for follow up. He has had GERD for one month, taking Zantac.  ?Diarrhea without blood, or mucous in stools. He has no tarry stools. Denies any mucous in stools.  ?He has been taking Ibuprofen twice daily. ?Denies any  recent illness, back pain or abdominal pain.  ?He has reflux that starts in the morning and evening. Denies any chest pain.  ?Sees Dr. Allen Norris, gastroenterolgy- for consult schedules.  ? ?Patient  denies any fever, body aches,chills, rash, chest pain, shortness of breath, nausea, vomiting, or diarrhea.  ?Denies dizziness, lightheadedness, pre syncopal or syncopal episodes.  ? ?Observations/Objective:No vital signs available.  ? ? ?Patient is alert and oriented and responsive to questions Engages in conversation with provider. Speaks in full sentences without any pauses without any shortness of breath or distress.   ? ?Assessment and Plan: ? ?1. Gastroesophageal reflux disease, unspecified whether esophagitis present ?Recent NSAID use ibuprofen, stop NSAID'S, tylenol as needed for aches, and start pantoprazole until he sees Dr. Allen Norris GI .  ?Discussed diet as well.  ?He will stop his OTC Zantac.  ?Sent in Protonix as below he will see if cancellation list  for Dr. Allen Norris and also if not keep appointment in May 2023.  ?Seek care sooner if persistent.  ?Meds ordered this encounter  ?Medications  ? pantoprazole (PROTONIX) 40 MG tablet  ?  Sig: Take 1 tablet (40 mg total) by mouth daily.  ?  Dispense:  90 tablet  ?  Refill:  0  ?  ?Keep follow up with Dr. Allen Norris with gastroenterology for endoscopy/ colonoscopy evaluation.  ?He has no other concerns or medication refill needs today.  ? ?With New PCP:  ?Return in about 1 month (around 05/19/2021), or if symptoms worsen or fail to improve, for at any time for any worsening symptoms, Go to Emergency room/ urgent care if worse.  ? ?Patient is aware that his primary care provider is leaving the office and last day will be April 19, 2021 and he will need to establish care with a new primary care provider, list is available upfront for patient to help him with establishing care or they can choose a provider of their choice.Patient verbalized understanding of all instructions given and denies any further questions at this time.   ? ?Follow Up Instructions: ? ?Red Flags discussed. The patient was given clear instructions to go to ER or return to medical center if any red flags develop, symptoms do not improve, worsen or new problems develop. They verbalized understanding. ? ?  ?I discussed the assessment and treatment plan with the patient. The patient was provided an opportunity to ask questions and all were answered. The patient agreed with the plan and demonstrated an understanding of the instructions. ?  ?The  patient was advised to call back or seek an in-person evaluation if the symptoms worsen or if the condition fails to improve as anticipated. ? ?I provided 20 minutes of non-face-to-face time during this encounter. ? ? ?Marcille Buffy, FNP  ?

## 2021-04-18 NOTE — Patient Instructions (Signed)
Gastroesophageal Reflux Disease, Adult ?Gastroesophageal reflux (GER) happens when acid from the stomach flows up into the tube that connects the mouth and the stomach (esophagus). Normally, food travels down the esophagus and stays in the stomach to be digested. With GER, food and stomach acid sometimes move back up into the esophagus. ?You may have a disease called gastroesophageal reflux disease (GERD) if the reflux: ?Happens often. ?Causes frequent or very bad symptoms. ?Causes problems such as damage to the esophagus. ?When this happens, the esophagus becomes sore and swollen. Over time, GERD can make small holes (ulcers) in the lining of the esophagus. ?What are the causes? ?This condition is caused by a problem with the muscle between the esophagus and the stomach. When this muscle is weak or not normal, it does not close properly to keep food and acid from coming back up from the stomach. ?The muscle can be weak because of: ?Tobacco use. ?Pregnancy. ?Having a certain type of hernia (hiatal hernia). ?Alcohol use. ?Certain foods and drinks, such as coffee, chocolate, onions, and peppermint. ?What increases the risk? ?Being overweight. ?Having a disease that affects your connective tissue. ?Taking NSAIDs, such a ibuprofen. ?What are the signs or symptoms? ?Heartburn. ?Difficult or painful swallowing. ?The feeling of having a lump in the throat. ?A bitter taste in the mouth. ?Bad breath. ?Having a lot of saliva. ?Having an upset or bloated stomach. ?Burping. ?Chest pain. Different conditions can cause chest pain. Make sure you see your doctor if you have chest pain. ?Shortness of breath or wheezing. ?A long-term cough or a cough at night. ?Wearing away of the surface of teeth (tooth enamel). ?Weight loss. ?How is this treated? ?Making changes to your diet. ?Taking medicine. ?Having surgery. ?Treatment will depend on how bad your symptoms are. ?Follow these instructions at home: ?Eating and drinking ? ?Follow a  diet as told by your doctor. You may need to avoid foods and drinks such as: ?Coffee and tea, with or without caffeine. ?Drinks that contain alcohol. ?Energy drinks and sports drinks. ?Bubbly (carbonated) drinks or sodas. ?Chocolate and cocoa. ?Peppermint and mint flavorings. ?Garlic and onions. ?Horseradish. ?Spicy and acidic foods. These include peppers, chili powder, curry powder, vinegar, hot sauces, and BBQ sauce. ?Citrus fruit juices and citrus fruits, such as oranges, lemons, and limes. ?Tomato-based foods. These include red sauce, chili, salsa, and pizza with red sauce. ?Fried and fatty foods. These include donuts, french fries, potato chips, and high-fat dressings. ?High-fat meats. These include hot dogs, rib eye steak, sausage, ham, and bacon. ?High-fat dairy items, such as whole milk, butter, and cream cheese. ?Eat small meals often. Avoid eating large meals. ?Avoid drinking large amounts of liquid with your meals. ?Avoid eating meals during the 2-3 hours before bedtime. ?Avoid lying down right after you eat. ?Do not exercise right after you eat. ?Lifestyle ? ?Do not smoke or use any products that contain nicotine or tobacco. If you need help quitting, ask your doctor. ?Try to lower your stress. If you need help doing this, ask your doctor. ?If you are overweight, lose an amount of weight that is healthy for you. Ask your doctor about a safe weight loss goal. ?General instructions ?Pay attention to any changes in your symptoms. ?Take over-the-counter and prescription medicines only as told by your doctor. ?Do not take aspirin, ibuprofen, or other NSAIDs unless your doctor says it is okay. ?Wear loose clothes. Do not wear anything tight around your waist. ?Raise (elevate) the head of your bed about 6  inches (15 cm). You may need to use a wedge to do this. ?Avoid bending over if this makes your symptoms worse. ?Keep all follow-up visits. ?Contact a doctor if: ?You have new symptoms. ?You lose weight and you  do not know why. ?You have trouble swallowing or it hurts to swallow. ?You have wheezing or a cough that keeps happening. ?You have a hoarse voice. ?Your symptoms do not get better with treatment. ?Get help right away if: ?You have sudden pain in your arms, neck, jaw, teeth, or back. ?You suddenly feel sweaty, dizzy, or light-headed. ?You have chest pain or shortness of breath. ?You vomit and the vomit is green, yellow, or black, or it looks like blood or coffee grounds. ?You faint. ?Your poop (stool) is red, bloody, or black. ?You cannot swallow, drink, or eat. ?These symptoms may represent a serious problem that is an emergency. Do not wait to see if the symptoms will go away. Get medical help right away. Call your local emergency services (911 in the [place] Do not drive yourself to the hospital. ?Summary ?If a person has gastroesophageal reflux disease (GERD), food and stomach acid move back up into the esophagus and cause symptoms or problems such as damage to the esophagus. ?Treatment will depend on how bad your symptoms are. ?Follow a diet as told by your doctor. ?Take all medicines only as told by your doctor. ?This information is not intended to replace advice given to you by your health care provider. Make sure you discuss any questions you have with your health care provider. ?Document Revised: 07/26/2019 Document Reviewed: 07/26/2019 ?Elsevier Patient Education ? [place] [facility]. ?Pantoprazole Granules for Suspension ?What is this medication? ?PANTOPRAZOLE (pan TOE pra zole) treats heartburn, stomach ulcers, reflux disease, or other conditions that cause too much stomach acid. It works by reducing the amount of acid in the stomach. It belongs to a group of medications called PPIs. ?This medicine may be used for other purposes; ask your health care provider or pharmacist if you have questions. ?COMMON BRAND NAME(S): Protonix ?What should I tell my care team before I take this medication? ?They need to  know if you have any of these conditions: ?Liver disease ?Low levels of calcium, magnesium, or potassium in the blood ?Lupus ?An unusual or allergic reaction to pantoprazole, other medications, foods, dyes, or preservatives ?Pregnant or trying to get pregnant ?Breast-feeding ?How should I use this medication? ?Take this medication by mouth. Follow the directions on the prescription label. To take this medication, you can sprinkle the granules on a teaspoonful of applesauce and swallow. Or, you can put the granules in a small cup and mix with a teaspoonful of apple juice. Stir for 5 seconds and drink. Rinse the cup at least once with more apple juice and drink it to be sure you have taken your full dose. Mix your medication in applesauce or apple juice ONLY. Do not mix with water or any other liquids or foods. Take your dose 30 minutes before a meal. Take your medication at regular intervals. Do not take your medication more often than directed. Take all of your medication as directed even if you think you are better. Do not skip doses or stop your medication early. ?A special MedGuide will be given to you by the pharmacist with each prescription and refill. Be sure to read this information carefully each time. ?Talk to your care team about the use of this medication in children. While this medication may be prescribed  for children as young as 5 years for selected conditions, precautions do apply. ?Overdosage: If you think you have taken too much of this medicine contact a poison control center or emergency room at once. ?NOTE: This medicine is only for you. Do not share this medicine with others. ?What if I miss a dose? ?If you miss a dose, take it as soon as you can. If it is almost time for your next dose, take only that dose. Do not take double or extra doses. ?What may interact with this medication? ?Do not take this medication with any of the following: ?Atazanavir ?Nelfinavir ?This medication may also interact  with the following: ?Ampicillin ?Delavirdine ?Erlotinib ?Iron salts ?Medications for fungal infections like ketoconazole, itraconazole and voriconazole ?Methotrexate ?Mycophenolate mofetil ?Warfarin ?T

## 2021-04-23 ENCOUNTER — Ambulatory Visit: Payer: Medicaid Other | Admitting: Adult Health

## 2021-05-07 ENCOUNTER — Ambulatory Visit (INDEPENDENT_AMBULATORY_CARE_PROVIDER_SITE_OTHER): Payer: Medicaid Other | Admitting: Family Medicine

## 2021-05-07 ENCOUNTER — Encounter: Payer: Self-pay | Admitting: Family Medicine

## 2021-05-07 DIAGNOSIS — R197 Diarrhea, unspecified: Secondary | ICD-10-CM | POA: Insufficient documentation

## 2021-05-07 DIAGNOSIS — E785 Hyperlipidemia, unspecified: Secondary | ICD-10-CM | POA: Diagnosis not present

## 2021-05-07 DIAGNOSIS — Z8619 Personal history of other infectious and parasitic diseases: Secondary | ICD-10-CM

## 2021-05-07 DIAGNOSIS — I10 Essential (primary) hypertension: Secondary | ICD-10-CM | POA: Diagnosis not present

## 2021-05-07 NOTE — Assessment & Plan Note (Signed)
Patient completed treatment.  Had a repeat viral load that was negative in November.  He reports he is around a year after treatment.  We will recheck his viral load and discussed if it was negative he would not need any further evaluation with lab work. ?

## 2021-05-07 NOTE — Assessment & Plan Note (Signed)
Undetermined control.  He will continue HCTZ 12.5 mg once daily.  He will have a nurse blood pressure check when he comes in for lab work. ?

## 2021-05-07 NOTE — Progress Notes (Signed)
? ?Virtual Visit via telephone Note ? ?This visit type was conducted due to national recommendations for restrictions regarding the COVID-19 pandemic (e.g. social distancing).  This format is felt to be most appropriate for this patient at this time.  All issues noted in this document were discussed and addressed.  No physical exam was performed (except for noted visual exam findings with Video Visits).  ? ?I connected with Richard Vasquez today at 10:00 AM EDT by telephone and verified that I am speaking with the correct person using two identifiers. ?Location patient: home ?Location provider: work  ?Persons participating in the virtual visit: patient, provider ? ?I discussed the limitations, risks, security and privacy concerns of performing an evaluation and management service by telephone and the availability of in person appointments. I also discussed with the patient that there may be a patient responsible charge related to this service. The patient expressed understanding and agreed to proceed. ? ?Interactive audio and video telecommunications were attempted between this provider and patient, however failed, due to patient having technical difficulties OR patient did not have access to video capability.  We continued and completed visit with audio only.  ? ?Reason for visit: f/u. ? ?HPI: ?HYPERLIPIDEMIA ?Symptoms ?Chest pain on exertion:  no   Leg claudication:   no ?Medications: ?Compliance- taking crestor Right upper quadrant pain- no  Muscle aches- yes, maybe slightly more than his baseline ?Lipid Panel  ?   ?Component Value Date/Time  ? CHOL 179 03/20/2020 1045  ? TRIG 109 03/20/2020 1045  ? HDL 34 (L) 03/20/2020 1045  ? CHOLHDL 5.3 (H) 03/20/2020 1045  ? CHOLHDL 4.8 07/08/2018 1103  ? VLDL 7 07/08/2018 1103  ? LDLCALC 125 (H) 03/20/2020 1045  ? LABVLDL 20 03/20/2020 1045  ? ?HYPERTENSION ?Disease Monitoring ?Home BP Monitoring not checking  Chest pain- no    Dyspnea- no ?Medications ?Compliance-  taking  HCTZ ?BMET ?   ?Component Value Date/Time  ? NA 139 03/22/2021 1431  ? NA 137 03/20/2020 1045  ? K 4.2 03/22/2021 1431  ? CL 106 03/22/2021 1431  ? CO2 29 03/22/2021 1431  ? GLUCOSE 95 03/22/2021 1431  ? BUN 8 03/22/2021 1431  ? BUN 9 03/20/2020 1045  ? CREATININE 0.70 03/22/2021 1431  ? CALCIUM 9.1 03/22/2021 1431  ? GFRNONAA 103 03/20/2020 1045  ? GFRAA 119 03/20/2020 1045  ? ?Stomach issues: Patient reports intermittent issues with constipation throughout his life though over the last few months he switched over to diarrhea and lots of gas on a daily basis.  Reports the diarrhea can be liquid or loose.  He notes no new medications.  No food changes.  No blood in his stool.  He has not been able to identify any foods that specifically cause this.  He notes it was darker for a month though he was taking Pepto-Bismol and once he started that the darkness resolved.  He notes no abdominal pain.  He does see GI in May. ? ?History of hepatitis C: Patient was treated by GI.  He had follow-up lab testing in November that was that revealed a nondetectable viral load. ? ? ?ROS: See pertinent positives and negatives per HPI. ? ?Past Medical History:  ?Diagnosis Date  ? Anxiety   ? Arthritis   ? BPH (benign prostatic hyperplasia)   ? C2 cervical fracture ([place]) 06/12/2018  ? Chronic pain   ? Closed displaced supracondylar fracture of distal end of right femur with intracondylar extension ([place]) 06/10/2018  ?  Closed fracture of distal end of right femur ([place]) 06/09/2018  ? Closed left hip fracture, initial encounter ([place]) 08/18/2018  ? Depression   ? Hepatitis C   ? Paranoid schizophrenia ([place])   ? Sleep apnea   ? ? ?Past Surgical History:  ?Procedure Laterality Date  ? COLONOSCOPY WITH PROPOFOL N/A 05/18/2020  ? Procedure: COLONOSCOPY WITH PROPOFOL;  Surgeon: [name], MD;  Location: [facility] ENDOSCOPY;  Service: Endoscopy;  Laterality: N/A;  ? FRACTURE SURGERY    ? HIP PINNING,CANNULATED Left 08/18/2018  ? Procedure: CANNULATED HIP  PINNING, Right knee aspiration;  Surgeon: Thornton Park, MD;  Location: [facility] ORS;  Service: Orthopedics;  Laterality: Left;  ? JOINT REPLACEMENT    ? ORIF FEMUR FRACTURE Right 06/10/2018  ? Procedure: OPEN REDUCTION INTERNAL FIXATION (ORIF) DISTAL FEMUR FRACTURE;  Surgeon: Shona Needles, MD;  Location: [place];  Service: Orthopedics;  Laterality: Right;  ? ? ?Family History  ?Problem Relation Age of Onset  ? Cancer Mother   ? Diabetes Mother   ? Diabetes Paternal Aunt   ? Lung cancer Paternal Uncle   ? ? ?SOCIAL HX: Patient reports he quit smoking recently. ? ? ?Current Outpatient Medications:  ?  albuterol (VENTOLIN HFA) 108 (90 Base) MCG/ACT inhaler, Inhale 1-2 puffs into the lungs every 6 (six) hours as needed for wheezing or shortness of breath., Disp: 8 g, Rfl: 11 ?  Ascorbic Acid (VITAMIN C) 100 MG tablet, Take 100 mg by mouth daily., Disp: , Rfl:  ?  Blood Pressure Monitor KIT, Use to check blood pressure daily. ICD-10: I10, Disp: 1 kit, Rfl: 1 ?  docusate sodium (COLACE) 100 MG capsule, Take 1 capsule (100 mg total) by mouth 2 (two) times daily., Disp: 60 capsule, Rfl: 1 ?  hydrochlorothiazide (MICROZIDE) 12.5 MG capsule, Take 1 capsule (12.5 mg total) by mouth daily., Disp: 90 capsule, Rfl: 1 ?  OLANZapine (ZYPREXA) 5 MG tablet, Take 5 mg by mouth every morning., Disp: , Rfl:  ?  Omega-3 Fatty Acids (FISH OIL) 1000 MG CAPS, Take by mouth., Disp: , Rfl:  ?  pantoprazole (PROTONIX) 40 MG tablet, Take 1 tablet (40 mg total) by mouth daily., Disp: 90 tablet, Rfl: 0 ?  Polyethylene Glycol 3350 (PEG 3350) 17 g PACK, MIX ONE PACKET IN LIQUID AND DRINK BY MOUTH ONCE DAILY AS NEEDED FOR MODERATE OR SEVERE CONSTIPATION, Disp: 30 each, Rfl: 4 ?  QUEtiapine (SEROQUEL) 200 MG tablet, Take 1 tablet (200 mg total) by mouth at bedtime., Disp: 30 tablet, Rfl: 1 ?  rosuvastatin (CRESTOR) 10 MG tablet, Take 1 tablet (10 mg total) by mouth daily., Disp: 90 tablet, Rfl: 1 ?  Tiotropium Bromide Monohydrate (SPIRIVA RESPIMAT)  2.5 MCG/ACT AERS, Inhale 2 puffs into the lungs daily., Disp: 4 g, Rfl: 2 ?  traZODone (DESYREL) 100 MG tablet, Take 1 tablet (100 mg total) by mouth at bedtime., Disp: 30 tablet, Rfl: 1 ? ?EXAM: This was a telephone visit and thus no exam was completed. ? ?ASSESSMENT AND PLAN: ? ?Discussed the following assessment and plan: ? ?Problem List Items Addressed This Visit   ? ? Diarrhea  ?  Possibly food related.  Given the duration I would think it would be less likely to be bacterial though we will check a C. difficile test.  He will see GI to consider further work-up.  We will give him a FODMAP diet to see if that makes any difference. ?  ?  ? Relevant Orders  ? C Difficile  Quick Screen w PCR reflex  ? History of hepatitis C  ?  Patient completed treatment.  Had a repeat viral load that was negative in November.  He reports he is around a year after treatment.  We will recheck his viral load and discussed if it was negative he would not need any further evaluation with lab work. ?  ?  ? Relevant Orders  ? Hepatitis C RNA quantitative ([facility])  ? Hyperlipidemia  ?  Discussed that his Crestor could be contributing to some of his mild achiness.  Advised he could stop this for 1 week and see if his symptoms improve.  If they do not improve he can restart the Crestor.  If they do improve he will let us know and we will need to find an alternative statin.  Check lipid panel. ?  ?  ? Relevant Orders  ? Lipid panel  ? Hypertension  ?  Undetermined control.  He will continue HCTZ 12.5 mg once daily.  He will have a nurse blood pressure check when he comes in for lab work. ?  ?  ? ? ?Return in about 3 days (around 05/10/2021) for labs and nurse BP check, 3 months PCP for follow-up. ?  ?I discussed the assessment and treatment plan with the patient. The patient was provided an opportunity to ask questions and all were answered. The patient agreed with the plan and demonstrated an understanding of the instructions. ?  ?The patient  was advised to call back or seek an in-person evaluation if the symptoms worsen or if the condition fails to improve as anticipated. ? ?I provided 17 minutes of non-face-to-face time during this encount

## 2021-05-07 NOTE — Assessment & Plan Note (Signed)
Possibly food related.  Given the duration I would think it would be less likely to be bacterial though we will check a C. difficile test.  He will see GI to consider further work-up.  We will give him a FODMAP diet to see if that makes any difference. ?

## 2021-05-07 NOTE — Patient Instructions (Addendum)
Nice to see you. ?Please look at the dietary information below and eliminate or reduce any foods that you eat excessively that could be contributing to your diarrhea and gas. ?We will provide you with a stool sample collection kit.  Please return the stool sample to the hospital. ? ?Low-FODMAP Eating Plan ?FODMAP stands for fermentable oligosaccharides, disaccharides, monosaccharides, and polyols. These are sugars that are hard for some people to digest. A low-FODMAP eating plan may help some people who have irritable bowel syndrome (IBS) and certain other bowel (intestinal) diseases to manage their symptoms. ?This meal plan can be complicated to follow. Work with a diet and nutrition specialist (dietitian) to make a low-FODMAP eating plan that is right for you. A dietitian can help make sure that you get enough nutrition from this diet. ?What are tips for following this plan? ?Reading food labels ?Check labels for hidden FODMAPs such as: ?High-fructose syrup. ?Honey. ?Agave. ?Natural fruit flavors. ?Onion or garlic powder. ?Choose low-FODMAP foods that contain 3-4 grams of fiber per serving. ?Check food labels for serving sizes. Eat only one serving at a time to make sure FODMAP levels stay low. ?Shopping ?Shop with a list of foods that are recommended on this diet and make a meal plan. ?Meal planning ?Follow a low-FODMAP eating plan for up to 6 weeks, or as told by your health care provider or dietitian. ?To follow the eating plan: ?Eliminate high-FODMAP foods from your diet completely. Choose only low-FODMAP foods to eat. You will do this for 2-6 weeks. ?Gradually reintroduce high-FODMAP foods into your diet one at a time. Most people should wait a few days before introducing the next new high-FODMAP food into their meal plan. Your dietitian can recommend how quickly you may reintroduce foods. ?Keep a daily record of what and how much you eat and drink. Make note of any symptoms that you have after  eating. ?Review your daily record with a dietitian regularly to identify which foods you can eat and which foods you should avoid. ?General tips ?Drink enough fluid each day to keep your urine pale yellow. ?Avoid processed foods. These often have added sugar and may be high in FODMAPs. ?Avoid most dairy products, whole grains, and sweeteners. ?Work with a dietitian to make sure you get enough fiber in your diet. ?Avoid high FODMAP foods at meals to manage symptoms. ?Recommended foods ?Fruits ?Bananas, oranges, tangerines, lemons, limes, blueberries, raspberries, strawberries, grapes, cantaloupe, honeydew melon, kiwi, papaya, passion fruit, and pineapple. Limited amounts of dried cranberries, banana chips, and shredded coconut. ?Vegetables ?Eggplant, zucchini, cucumber, peppers, green beans, bean sprouts, lettuce, arugula, kale, Swiss chard, spinach, collard greens, bok choy, summer squash, potato, and tomato. Limited amounts of corn, carrot, and sweet potato. Green parts of scallions. ?Grains ?Gluten-free grains, such as rice, oats, buckwheat, quinoa, corn, polenta, and millet. Gluten-free pasta, bread, or cereal. Rice noodles. Corn tortillas. ?Meats and other proteins ?Unseasoned beef, pork, poultry, or fish. Eggs. Berniece Salines. Tofu (firm) and tempeh. Limited amounts of nuts and seeds, such as almonds, walnuts, [place] nuts, pecans, peanuts, nut butters, pumpkin seeds, chia seeds, and sunflower seeds. ?Dairy ?Lactose-free milk, yogurt, and kefir. Lactose-free cottage cheese and ice cream. Non-dairy milks, such as almond, coconut, hemp, and rice milk. Non-dairy yogurt. Limited amounts of goat cheese, brie, mozzarella, parmesan, swiss, and other hard cheeses. ?Fats and oils ?Butter-free spreads. Vegetable oils, such as olive, canola, and sunflower oil. ?Seasoning and other foods ?Artificial sweeteners with names that do not end in "ol," such as aspartame,  saccharine, and stevia. Maple syrup, white table sugar, raw sugar,  brown sugar, and molasses. Mayonnaise, soy sauce, and tamari. Fresh basil, coriander, parsley, rosemary, and thyme. ?Beverages ?Water and mineral water. Sugar-sweetened soft drinks. Small amounts of orange juice or cranberry juice. Black and green tea. Most dry wines. Coffee. ?The items listed above may not be a complete list of foods and beverages you can eat. Contact a dietitian for more information. ?Foods to avoid ?Fruits ?Fresh, dried, and juiced forms of apple, pear, watermelon, peach, plum, cherries, apricots, blackberries, boysenberries, figs, nectarines, and mango. Avocado. ?Vegetables ?Chicory root, artichoke, asparagus, cabbage, snow peas, Brussels sprouts, broccoli, sugar snap peas, mushrooms, celery, and cauliflower. Onions, garlic, leeks, and the white part of scallions. ?Grains ?Wheat, including kamut, durum, and semolina. Barley and bulgur. Couscous. Wheat-based cereals. Wheat noodles, bread, crackers, and pastries. ?Meats and other proteins ?Fried or fatty meat. Sausage. Cashews and pistachios. Soybeans, baked beans, black beans, chickpeas, kidney beans, fava beans, navy beans, lentils, black-eyed peas, and split peas. ?Dairy ?Milk, yogurt, ice cream, and soft cheese. Cream and sour cream. Milk-based sauces. Custard. Buttermilk. Soy milk. ?Seasoning and other foods ?Any sugar-free gum or candy. Foods that contain artificial sweeteners such as sorbitol, mannitol, isomalt, or xylitol. Foods that contain honey, high-fructose corn syrup, or agave. Bouillon, vegetable stock, beef stock, and chicken stock. Garlic and onion powder. Condiments made with onion, such as hummus, chutney, pickles, relish, salad dressing, and salsa. Tomato paste. ?Beverages ?Chicory-based drinks. Coffee substitutes. Chamomile tea. Fennel tea. Sweet or fortified wines such as port or sherry. Diet soft drinks made with isomalt, mannitol, maltitol, sorbitol, or xylitol. Apple, pear, and mango juice. Juices with high-fructose corn  syrup. ?The items listed above may not be a complete list of foods and beverages you should avoid. Contact a dietitian for more information. ?Summary ?FODMAP stands for fermentable oligosaccharides, disaccharides, monosaccharides, and polyols. These are sugars that are hard for some people to digest. ?A low-FODMAP eating plan is a short-term diet that helps to ease symptoms of certain bowel diseases. ?The eating plan usually lasts up to 6 weeks. After that, high-FODMAP foods are reintroduced gradually and one at a time. This can help you find out which foods may be causing symptoms. ?A low-FODMAP eating plan can be complicated. It is best to work with a dietitian who has experience with this type of plan. ?This information is not intended to replace advice given to you by your health care provider. Make sure you discuss any questions you have with your health care provider. ?Document Revised: 06/03/2019 Document Reviewed: 06/03/2019 ?Elsevier Patient Education ? [place]. ? ?

## 2021-05-07 NOTE — Assessment & Plan Note (Addendum)
Discussed that his Crestor could be contributing to some of his mild achiness.  Advised he could stop this for 1 week and see if his symptoms improve.  If they do not improve he can restart the Crestor.  If they do improve he will let us know and we will need to find an alternative statin.  Check lipid panel. ?

## 2021-05-17 ENCOUNTER — Emergency Department (HOSPITAL_COMMUNITY): Payer: Medicaid Other

## 2021-05-17 ENCOUNTER — Other Ambulatory Visit: Payer: Self-pay

## 2021-05-17 ENCOUNTER — Encounter (HOSPITAL_COMMUNITY): Payer: Self-pay

## 2021-05-17 ENCOUNTER — Inpatient Hospital Stay (HOSPITAL_COMMUNITY): Payer: Medicaid Other | Admitting: Certified Registered"

## 2021-05-17 ENCOUNTER — Inpatient Hospital Stay (HOSPITAL_COMMUNITY): Payer: Medicaid Other

## 2021-05-17 ENCOUNTER — Encounter (HOSPITAL_COMMUNITY): Admission: EM | Disposition: A | Discharge status: Rehabilitation Facility | Payer: Self-pay | Source: Home / Self Care

## 2021-05-17 ENCOUNTER — Inpatient Hospital Stay (HOSPITAL_COMMUNITY)
Admission: EM | Admit: 2021-05-17 | Discharge: 2021-05-21 | DRG: 492 | Disposition: A | Discharge status: Rehabilitation Facility | Payer: Medicaid Other | Attending: General Surgery | Admitting: General Surgery

## 2021-05-17 DIAGNOSIS — S82202A Unspecified fracture of shaft of left tibia, initial encounter for closed fracture: Principal | ICD-10-CM | POA: Diagnosis present

## 2021-05-17 DIAGNOSIS — M7989 Other specified soft tissue disorders: Secondary | ICD-10-CM | POA: Diagnosis not present

## 2021-05-17 DIAGNOSIS — S3210XA Unspecified fracture of sacrum, initial encounter for closed fracture: Secondary | ICD-10-CM | POA: Diagnosis present

## 2021-05-17 DIAGNOSIS — Z87891 Personal history of nicotine dependence: Secondary | ICD-10-CM

## 2021-05-17 DIAGNOSIS — K5901 Slow transit constipation: Secondary | ICD-10-CM | POA: Diagnosis not present

## 2021-05-17 DIAGNOSIS — K219 Gastro-esophageal reflux disease without esophagitis: Secondary | ICD-10-CM | POA: Diagnosis present

## 2021-05-17 DIAGNOSIS — S32009A Unspecified fracture of unspecified lumbar vertebra, initial encounter for closed fracture: Secondary | ICD-10-CM

## 2021-05-17 DIAGNOSIS — S82402A Unspecified fracture of shaft of left fibula, initial encounter for closed fracture: Secondary | ICD-10-CM | POA: Diagnosis present

## 2021-05-17 DIAGNOSIS — I10 Essential (primary) hypertension: Secondary | ICD-10-CM | POA: Diagnosis present

## 2021-05-17 DIAGNOSIS — S3993XA Unspecified injury of pelvis, initial encounter: Secondary | ICD-10-CM | POA: Diagnosis not present

## 2021-05-17 DIAGNOSIS — F419 Anxiety disorder, unspecified: Secondary | ICD-10-CM | POA: Diagnosis present

## 2021-05-17 DIAGNOSIS — S32029A Unspecified fracture of second lumbar vertebra, initial encounter for closed fracture: Secondary | ICD-10-CM | POA: Diagnosis present

## 2021-05-17 DIAGNOSIS — F209 Schizophrenia, unspecified: Secondary | ICD-10-CM | POA: Diagnosis present

## 2021-05-17 DIAGNOSIS — N400 Enlarged prostate without lower urinary tract symptoms: Secondary | ICD-10-CM | POA: Diagnosis present

## 2021-05-17 DIAGNOSIS — S32039A Unspecified fracture of third lumbar vertebra, initial encounter for closed fracture: Secondary | ICD-10-CM | POA: Diagnosis present

## 2021-05-17 DIAGNOSIS — Y9301 Activity, walking, marching and hiking: Secondary | ICD-10-CM | POA: Diagnosis present

## 2021-05-17 DIAGNOSIS — S32401A Unspecified fracture of right acetabulum, initial encounter for closed fracture: Secondary | ICD-10-CM | POA: Diagnosis present

## 2021-05-17 DIAGNOSIS — S32810S Multiple fractures of pelvis with stable disruption of pelvic ring, sequela: Secondary | ICD-10-CM | POA: Diagnosis not present

## 2021-05-17 DIAGNOSIS — Z20822 Contact with and (suspected) exposure to covid-19: Secondary | ICD-10-CM | POA: Diagnosis present

## 2021-05-17 DIAGNOSIS — S069X1S Unspecified intracranial injury with loss of consciousness of 30 minutes or less, sequela: Secondary | ICD-10-CM | POA: Diagnosis not present

## 2021-05-17 DIAGNOSIS — F32A Depression, unspecified: Secondary | ICD-10-CM | POA: Diagnosis present

## 2021-05-17 DIAGNOSIS — S069X0S Unspecified intracranial injury without loss of consciousness, sequela: Secondary | ICD-10-CM | POA: Diagnosis not present

## 2021-05-17 DIAGNOSIS — R402362 Coma scale, best motor response, obeys commands, at arrival to emergency department: Secondary | ICD-10-CM | POA: Diagnosis present

## 2021-05-17 DIAGNOSIS — R402142 Coma scale, eyes open, spontaneous, at arrival to emergency department: Secondary | ICD-10-CM | POA: Diagnosis present

## 2021-05-17 DIAGNOSIS — S32810A Multiple fractures of pelvis with stable disruption of pelvic ring, initial encounter for closed fracture: Secondary | ICD-10-CM | POA: Diagnosis present

## 2021-05-17 DIAGNOSIS — R402252 Coma scale, best verbal response, oriented, at arrival to emergency department: Secondary | ICD-10-CM | POA: Diagnosis present

## 2021-05-17 DIAGNOSIS — R609 Edema, unspecified: Secondary | ICD-10-CM | POA: Diagnosis not present

## 2021-05-17 DIAGNOSIS — Y92410 Unspecified street and highway as the place of occurrence of the external cause: Secondary | ICD-10-CM

## 2021-05-17 DIAGNOSIS — F1021 Alcohol dependence, in remission: Secondary | ICD-10-CM | POA: Diagnosis present

## 2021-05-17 DIAGNOSIS — D62 Acute posthemorrhagic anemia: Secondary | ICD-10-CM | POA: Diagnosis not present

## 2021-05-17 DIAGNOSIS — Z23 Encounter for immunization: Secondary | ICD-10-CM | POA: Diagnosis not present

## 2021-05-17 DIAGNOSIS — Z8619 Personal history of other infectious and parasitic diseases: Secondary | ICD-10-CM | POA: Diagnosis not present

## 2021-05-17 DIAGNOSIS — S060X1A Concussion with loss of consciousness of 30 minutes or less, initial encounter: Secondary | ICD-10-CM | POA: Diagnosis present

## 2021-05-17 DIAGNOSIS — S329XXA Fracture of unspecified parts of lumbosacral spine and pelvis, initial encounter for closed fracture: Secondary | ICD-10-CM

## 2021-05-17 DIAGNOSIS — S069X1A Unspecified intracranial injury with loss of consciousness of 30 minutes or less, initial encounter: Secondary | ICD-10-CM | POA: Diagnosis not present

## 2021-05-17 DIAGNOSIS — F200 Paranoid schizophrenia: Secondary | ICD-10-CM | POA: Diagnosis not present

## 2021-05-17 HISTORY — PX: TIBIA IM NAIL INSERTION: SHX2516

## 2021-05-17 HISTORY — DX: Alcohol dependence, in remission: F10.21

## 2021-05-17 LAB — PROTIME-INR
INR: 1 (ref 0.8–1.2)
Prothrombin Time: 12.8 seconds (ref 11.4–15.2)

## 2021-05-17 LAB — RESP PANEL BY RT-PCR (FLU A&B, COVID) ARPGX2
Influenza A by PCR: NEGATIVE
Influenza B by PCR: NEGATIVE
SARS Coronavirus 2 by RT PCR: NEGATIVE

## 2021-05-17 LAB — SURGICAL PCR SCREEN
MRSA, PCR: NEGATIVE
Staphylococcus aureus: NEGATIVE

## 2021-05-17 LAB — SAMPLE TO BLOOD BANK

## 2021-05-17 LAB — COMPREHENSIVE METABOLIC PANEL
ALT: 74 U/L — ABNORMAL HIGH (ref 0–44)
AST: 159 U/L — ABNORMAL HIGH (ref 15–41)
Albumin: 4.3 g/dL (ref 3.5–5.0)
Alkaline Phosphatase: 56 U/L (ref 38–126)
Anion gap: 13 (ref 5–15)
BUN: 15 mg/dL (ref 6–20)
CO2: 23 mmol/L (ref 22–32)
Calcium: 9.6 mg/dL (ref 8.9–10.3)
Chloride: 104 mmol/L (ref 98–111)
Creatinine, Ser: 1.31 mg/dL — ABNORMAL HIGH (ref 0.61–1.24)
GFR, Estimated: >60 mL/min (ref >60)
Glucose, Bld: 101 mg/dL — ABNORMAL HIGH (ref 70–99)
Potassium: 3.8 mmol/L (ref 3.5–5.1)
Sodium: 140 mmol/L (ref 135–145)
Total Bilirubin: 0.9 mg/dL (ref 0.3–1.2)
Total Protein: 7.2 g/dL (ref 6.5–8.1)

## 2021-05-17 LAB — CBC
HCT: 43.6 % (ref 39.0–52.0)
Hemoglobin: 14.6 g/dL (ref 13.0–17.0)
MCH: 31.5 pg (ref 26.0–34.0)
MCHC: 33.5 g/dL (ref 30.0–36.0)
MCV: 94.2 fL (ref 80.0–100.0)
Platelets: 184 10*3/uL (ref 150–400)
RBC: 4.63 MIL/uL (ref 4.22–5.81)
RDW: 12.8 % (ref 11.5–15.5)
WBC: 13.5 10*3/uL — ABNORMAL HIGH (ref 4.0–10.5)
nRBC: 0 % (ref 0.0–0.2)

## 2021-05-17 LAB — I-STAT CHEM 8, ED
BUN: 17 mg/dL (ref 6–20)
Calcium, Ion: 1.07 mmol/L — ABNORMAL LOW (ref 1.15–1.40)
Chloride: 105 mmol/L (ref 98–111)
Creatinine, Ser: 1.1 mg/dL (ref 0.61–1.24)
Glucose, Bld: 97 mg/dL (ref 70–99)
HCT: 42 % (ref 39.0–52.0)
Hemoglobin: 14.3 g/dL (ref 13.0–17.0)
Potassium: 3.5 mmol/L (ref 3.5–5.1)
Sodium: 138 mmol/L (ref 135–145)
TCO2: 22 mmol/L (ref 22–32)

## 2021-05-17 LAB — VITAMIN D 25 HYDROXY (VIT D DEFICIENCY, FRACTURES): Vit D, 25-Hydroxy: 47.3 ng/mL (ref 30–100)

## 2021-05-17 LAB — HIV ANTIBODY (ROUTINE TESTING W REFLEX): HIV Screen 4th Generation wRfx: NONREACTIVE

## 2021-05-17 LAB — LACTIC ACID, PLASMA: Lactic Acid, Venous: 1.8 mmol/L (ref 0.5–1.9)

## 2021-05-17 LAB — ETHANOL: Alcohol, Ethyl (B): <10 mg/dL (ref <10)

## 2021-05-17 SURGERY — INSERTION, INTRAMEDULLARY ROD, TIBIA
Anesthesia: General | Site: Leg Lower | Laterality: Left

## 2021-05-17 MED ORDER — PHENYLEPHRINE 80 MCG/ML (10ML) SYRINGE FOR IV PUSH (FOR BLOOD PRESSURE SUPPORT)
PREFILLED_SYRINGE | INTRAVENOUS | Status: AC
  Filled 2021-05-17: qty 10

## 2021-05-17 MED ORDER — QUETIAPINE FUMARATE 200 MG PO TABS
200.0000 mg | ORAL_TABLET | Freq: Every day | ORAL | Status: DC
  Administered 2021-05-18 – 2021-05-19 (×3): 200 mg via ORAL
  Filled 2021-05-17 (×5): qty 1

## 2021-05-17 MED ORDER — ROSUVASTATIN CALCIUM 5 MG PO TABS
10.0000 mg | ORAL_TABLET | Freq: Every day | ORAL | Status: DC
Start: 2021-05-18 — End: 2021-05-21
  Administered 2021-05-18 – 2021-05-21 (×4): 10 mg via ORAL
  Filled 2021-05-17 (×4): qty 2

## 2021-05-17 MED ORDER — LIDOCAINE HCL (CARDIAC) PF 100 MG/5ML IV SOSY
PREFILLED_SYRINGE | INTRAVENOUS | Status: DC | PRN
  Administered 2021-05-17: 100 mg via INTRATRACHEAL

## 2021-05-17 MED ORDER — MIDAZOLAM HCL 2 MG/2ML IJ SOLN
INTRAMUSCULAR | Status: DC | PRN
  Administered 2021-05-17: 2 mg via INTRAVENOUS

## 2021-05-17 MED ORDER — CHLORHEXIDINE GLUCONATE 4 % EX LIQD
60.0000 mL | Freq: Once | CUTANEOUS | Status: DC
  Administered 2021-05-17: 4 via TOPICAL

## 2021-05-17 MED ORDER — SUGAMMADEX SODIUM 200 MG/2ML IV SOLN
INTRAVENOUS | Status: DC | PRN
  Administered 2021-05-17: 300 mg via INTRAVENOUS

## 2021-05-17 MED ORDER — MIDAZOLAM HCL 2 MG/2ML IJ SOLN
INTRAMUSCULAR | Status: AC
  Filled 2021-05-17: qty 2

## 2021-05-17 MED ORDER — PANTOPRAZOLE SODIUM 40 MG IV SOLR: 40.0000 mg | Freq: Every day | INTRAVENOUS | Status: DC

## 2021-05-17 MED ORDER — DOCUSATE SODIUM 100 MG PO CAPS
100.0000 mg | ORAL_CAPSULE | Freq: Two times a day (BID) | ORAL | Status: DC
  Administered 2021-05-18 – 2021-05-21 (×8): 100 mg via ORAL
  Filled 2021-05-17 (×8): qty 1

## 2021-05-17 MED ORDER — FENTANYL CITRATE (PF) 100 MCG/2ML IJ SOLN: 25.0000 ug | INTRAMUSCULAR | Status: DC | PRN

## 2021-05-17 MED ORDER — METHOCARBAMOL 750 MG PO TABS
750.0000 mg | ORAL_TABLET | Freq: Four times a day (QID) | ORAL | Status: DC
  Administered 2021-05-18 – 2021-05-21 (×15): 750 mg via ORAL
  Filled 2021-05-17 (×16): qty 1

## 2021-05-17 MED ORDER — ONDANSETRON HCL 4 MG/2ML IJ SOLN
4.0000 mg | Freq: Four times a day (QID) | INTRAMUSCULAR | Status: DC | PRN
Start: 2021-05-17 — End: 2021-05-21

## 2021-05-17 MED ORDER — ALBUTEROL SULFATE (2.5 MG/3ML) 0.083% IN NEBU: 2.5000 mg | INHALATION_SOLUTION | Freq: Four times a day (QID) | RESPIRATORY_TRACT | Status: DC | PRN

## 2021-05-17 MED ORDER — OXYCODONE HCL 5 MG/5ML PO SOLN: 5.0000 mg | Freq: Once | ORAL | Status: DC | PRN

## 2021-05-17 MED ORDER — CEFAZOLIN SODIUM-DEXTROSE 2-4 GM/100ML-% IV SOLN
INTRAVENOUS | Status: AC
  Filled 2021-05-17: qty 100

## 2021-05-17 MED ORDER — LACTATED RINGERS IV SOLN: INTRAVENOUS | Status: DC

## 2021-05-17 MED ORDER — PROPOFOL 10 MG/ML IV BOLUS
INTRAVENOUS | Status: AC
  Filled 2021-05-17: qty 20

## 2021-05-17 MED ORDER — PHENYLEPHRINE 80 MCG/ML (10ML) SYRINGE FOR IV PUSH (FOR BLOOD PRESSURE SUPPORT)
PREFILLED_SYRINGE | INTRAVENOUS | Status: DC | PRN
  Administered 2021-05-17 (×2): 160 ug via INTRAVENOUS
  Administered 2021-05-17: 240 ug via INTRAVENOUS
  Administered 2021-05-17: 80 ug via INTRAVENOUS
  Administered 2021-05-17: 160 ug via INTRAVENOUS

## 2021-05-17 MED ORDER — CEFAZOLIN SODIUM-DEXTROSE 2-4 GM/100ML-% IV SOLN
2.0000 g | Freq: Once | INTRAVENOUS | Status: AC
  Administered 2021-05-17: 2 g via INTRAVENOUS

## 2021-05-17 MED ORDER — GABAPENTIN 100 MG PO CAPS
100.0000 mg | ORAL_CAPSULE | Freq: Three times a day (TID) | ORAL | Status: DC
  Administered 2021-05-18 – 2021-05-21 (×12): 100 mg via ORAL
  Filled 2021-05-17 (×13): qty 1

## 2021-05-17 MED ORDER — ONDANSETRON HCL 4 MG/2ML IJ SOLN
INTRAMUSCULAR | Status: AC
  Filled 2021-05-17: qty 2

## 2021-05-17 MED ORDER — FENTANYL CITRATE PF 50 MCG/ML IJ SOSY
25.0000 ug | PREFILLED_SYRINGE | INTRAMUSCULAR | Status: DC | PRN
  Administered 2021-05-18: 25 ug via INTRAVENOUS
  Filled 2021-05-17: qty 1

## 2021-05-17 MED ORDER — ONDANSETRON HCL 4 MG/2ML IJ SOLN
INTRAMUSCULAR | Status: DC | PRN
  Administered 2021-05-17: 4 mg via INTRAVENOUS

## 2021-05-17 MED ORDER — TETANUS-DIPHTH-ACELL PERTUSSIS 5-2.5-18.5 LF-MCG/0.5 IM SUSY: 0.5000 mL | PREFILLED_SYRINGE | Freq: Once | INTRAMUSCULAR | Status: AC

## 2021-05-17 MED ORDER — CHLORHEXIDINE GLUCONATE 0.12 % MT SOLN
15.0000 mL | Freq: Once | OROMUCOSAL | Status: AC
  Administered 2021-05-17: 15 mL via OROMUCOSAL
  Filled 2021-05-17: qty 15

## 2021-05-17 MED ORDER — POLYETHYLENE GLYCOL 3350 17 G PO PACK: 17.0000 g | PACK | Freq: Every day | ORAL | Status: DC | PRN

## 2021-05-17 MED ORDER — OXYCODONE HCL 5 MG PO TABS: 10.0000 mg | ORAL_TABLET | ORAL | Status: DC | PRN

## 2021-05-17 MED ORDER — ROCURONIUM BROMIDE 10 MG/ML (PF) SYRINGE
PREFILLED_SYRINGE | INTRAVENOUS | Status: AC
  Filled 2021-05-17: qty 10

## 2021-05-17 MED ORDER — METOPROLOL TARTRATE 5 MG/5ML IV SOLN
2.5000 mg | INTRAVENOUS | Status: AC | PRN
  Administered 2021-05-17 (×2): 2.5 mg via INTRAVENOUS

## 2021-05-17 MED ORDER — HYDRALAZINE HCL 20 MG/ML IJ SOLN: 10.0000 mg | INTRAMUSCULAR | Status: DC | PRN

## 2021-05-17 MED ORDER — ROCURONIUM BROMIDE 100 MG/10ML IV SOLN
INTRAVENOUS | Status: DC | PRN
  Administered 2021-05-17: 50 mg via INTRAVENOUS

## 2021-05-17 MED ORDER — METOCLOPRAMIDE HCL 5 MG/ML IJ SOLN: 5.0000 mg | Freq: Three times a day (TID) | INTRAMUSCULAR | Status: DC | PRN

## 2021-05-17 MED ORDER — ACETAMINOPHEN 500 MG PO TABS
1000.0000 mg | ORAL_TABLET | Freq: Four times a day (QID) | ORAL | Status: DC
Start: 2021-05-17 — End: 2021-05-21
  Administered 2021-05-18 – 2021-05-21 (×13): 1000 mg via ORAL
  Filled 2021-05-17 (×15): qty 2

## 2021-05-17 MED ORDER — ORAL CARE MOUTH RINSE: 15.0000 mL | Freq: Once | OROMUCOSAL | Status: AC

## 2021-05-17 MED ORDER — DIPHENHYDRAMINE HCL 12.5 MG/5ML PO ELIX: 12.5000 mg | ORAL_SOLUTION | ORAL | Status: DC | PRN

## 2021-05-17 MED ORDER — PANTOPRAZOLE SODIUM 40 MG PO TBEC
40.0000 mg | DELAYED_RELEASE_TABLET | Freq: Every day | ORAL | Status: DC
Start: 2021-05-17 — End: 2021-05-21
  Administered 2021-05-17 – 2021-05-21 (×5): 40 mg via ORAL
  Filled 2021-05-17 (×5): qty 1

## 2021-05-17 MED ORDER — ENOXAPARIN SODIUM 30 MG/0.3ML IJ SOSY
30.0000 mg | PREFILLED_SYRINGE | Freq: Two times a day (BID) | INTRAMUSCULAR | Status: DC
  Administered 2021-05-18 – 2021-05-21 (×7): 30 mg via SUBCUTANEOUS
  Filled 2021-05-17 (×7): qty 0.3

## 2021-05-17 MED ORDER — VANCOMYCIN HCL 1000 MG IV SOLR
INTRAVENOUS | Status: DC | PRN
  Administered 2021-05-17: 1000 mg via TOPICAL

## 2021-05-17 MED ORDER — KETOROLAC TROMETHAMINE 30 MG/ML IJ SOLN
30.0000 mg | Freq: Once | INTRAMUSCULAR | Status: AC
  Administered 2021-05-17: 30 mg via INTRAVENOUS

## 2021-05-17 MED ORDER — TETANUS-DIPHTH-ACELL PERTUSSIS 5-2.5-18.5 LF-MCG/0.5 IM SUSY
PREFILLED_SYRINGE | INTRAMUSCULAR | Status: AC
  Administered 2021-05-17: 0.5 mL via INTRAMUSCULAR
  Filled 2021-05-17: qty 0.5

## 2021-05-17 MED ORDER — LACTATED RINGERS IV SOLN: INTRAVENOUS | Status: DC | PRN

## 2021-05-17 MED ORDER — ACETAMINOPHEN 10 MG/ML IV SOLN
INTRAVENOUS | Status: AC
  Filled 2021-05-17: qty 100

## 2021-05-17 MED ORDER — PROPOFOL 10 MG/ML IV BOLUS
INTRAVENOUS | Status: DC | PRN
  Administered 2021-05-17: 50 mg via INTRAVENOUS

## 2021-05-17 MED ORDER — METOPROLOL TARTRATE 5 MG/5ML IV SOLN
INTRAVENOUS | Status: AC
  Filled 2021-05-17: qty 5

## 2021-05-17 MED ORDER — OXYCODONE HCL 5 MG PO TABS: 5.0000 mg | ORAL_TABLET | ORAL | Status: DC | PRN

## 2021-05-17 MED ORDER — METOCLOPRAMIDE HCL 5 MG PO TABS: 5.0000 mg | ORAL_TABLET | Freq: Three times a day (TID) | ORAL | Status: DC | PRN

## 2021-05-17 MED ORDER — KETOROLAC TROMETHAMINE 30 MG/ML IJ SOLN
INTRAMUSCULAR | Status: AC
  Filled 2021-05-17: qty 1

## 2021-05-17 MED ORDER — FENTANYL CITRATE (PF) 250 MCG/5ML IJ SOLN
INTRAMUSCULAR | Status: AC
  Filled 2021-05-17: qty 5

## 2021-05-17 MED ORDER — OLANZAPINE 5 MG PO TABS
5.0000 mg | ORAL_TABLET | Freq: Every day | ORAL | Status: DC
  Administered 2021-05-17 – 2021-05-21 (×5): 5 mg via ORAL
  Filled 2021-05-17 (×5): qty 1

## 2021-05-17 MED ORDER — AMISULPRIDE (ANTIEMETIC) 5 MG/2ML IV SOLN: 10.0000 mg | Freq: Once | INTRAVENOUS | Status: DC | PRN

## 2021-05-17 MED ORDER — KETOROLAC TROMETHAMINE 15 MG/ML IJ SOLN
15.0000 mg | Freq: Four times a day (QID) | INTRAMUSCULAR | Status: DC | PRN
  Administered 2021-05-17 – 2021-05-18 (×2): 15 mg via INTRAVENOUS
  Filled 2021-05-17 (×2): qty 1

## 2021-05-17 MED ORDER — CEFAZOLIN SODIUM-DEXTROSE 2-4 GM/100ML-% IV SOLN
2.0000 g | INTRAVENOUS | Status: AC
  Administered 2021-05-17: 2 g via INTRAVENOUS

## 2021-05-17 MED ORDER — ONDANSETRON 4 MG PO TBDP: 4.0000 mg | ORAL_TABLET | Freq: Four times a day (QID) | ORAL | Status: DC | PRN

## 2021-05-17 MED ORDER — UMECLIDINIUM BROMIDE 62.5 MCG/ACT IN AEPB
1.0000 | INHALATION_SPRAY | Freq: Every day | RESPIRATORY_TRACT | Status: DC
  Administered 2021-05-18 – 2021-05-19 (×2): 1 via RESPIRATORY_TRACT
  Filled 2021-05-17: qty 7

## 2021-05-17 MED ORDER — VANCOMYCIN HCL 1000 MG IV SOLR
INTRAVENOUS | Status: AC
  Filled 2021-05-17: qty 20

## 2021-05-17 MED ORDER — LIDOCAINE 2% (20 MG/ML) 5 ML SYRINGE
INTRAMUSCULAR | Status: AC
  Filled 2021-05-17: qty 5

## 2021-05-17 MED ORDER — CEFAZOLIN SODIUM-DEXTROSE 2-4 GM/100ML-% IV SOLN
2.0000 g | Freq: Three times a day (TID) | INTRAVENOUS | Status: AC
  Administered 2021-05-17 – 2021-05-18 (×3): 2 g via INTRAVENOUS
  Filled 2021-05-17 (×3): qty 100

## 2021-05-17 MED ORDER — LACTATED RINGERS IV BOLUS
1000.0000 mL | Freq: Once | INTRAVENOUS | Status: AC
  Administered 2021-05-17: 1000 mL via INTRAVENOUS

## 2021-05-17 MED ORDER — POVIDONE-IODINE 10 % EX SWAB
2.0000 "application " | Freq: Once | CUTANEOUS | Status: AC
  Administered 2021-05-17: 2 via TOPICAL

## 2021-05-17 MED ORDER — METHOCARBAMOL 1000 MG/10ML IJ SOLN
1000.0000 mg | Freq: Once | INTRAVENOUS | Status: AC
  Administered 2021-05-17: 1000 mg via INTRAVENOUS
  Filled 2021-05-17: qty 10

## 2021-05-17 MED ORDER — HYDROMORPHONE HCL 1 MG/ML IJ SOLN: 0.5000 mg | INTRAMUSCULAR | Status: DC | PRN

## 2021-05-17 MED ORDER — FENTANYL CITRATE (PF) 250 MCG/5ML IJ SOLN
INTRAMUSCULAR | Status: DC | PRN
Start: 2021-05-17 — End: 2021-05-17
  Administered 2021-05-17 (×2): 50 ug via INTRAVENOUS
  Administered 2021-05-17: 100 ug via INTRAVENOUS

## 2021-05-17 MED ORDER — ACETAMINOPHEN 10 MG/ML IV SOLN
1000.0000 mg | Freq: Once | INTRAVENOUS | Status: DC | PRN
  Administered 2021-05-17: 1000 mg via INTRAVENOUS

## 2021-05-17 MED ORDER — IOHEXOL 300 MG/ML  SOLN
100.0000 mL | Freq: Once | INTRAMUSCULAR | Status: AC | PRN
  Administered 2021-05-17: 100 mL via INTRAVENOUS

## 2021-05-17 MED ORDER — PANTOPRAZOLE SODIUM 40 MG PO TBEC: 40.0000 mg | DELAYED_RELEASE_TABLET | Freq: Every day | ORAL | Status: DC

## 2021-05-17 MED ORDER — METHOCARBAMOL 1000 MG/10ML IJ SOLN: 1000.0000 mg | Freq: Three times a day (TID) | INTRAVENOUS | Status: DC

## 2021-05-17 MED ORDER — VITAMIN D (ERGOCALCIFEROL) 1.25 MG (50000 UNIT) PO CAPS
50000.0000 [IU] | ORAL_CAPSULE | ORAL | Status: DC
  Administered 2021-05-18: 50000 [IU] via ORAL
  Filled 2021-05-17: qty 1

## 2021-05-17 MED ORDER — OXYCODONE HCL 5 MG PO TABS: 5.0000 mg | ORAL_TABLET | Freq: Once | ORAL | Status: DC | PRN

## 2021-05-17 MED ORDER — MORPHINE SULFATE (PF) 4 MG/ML IV SOLN
INTRAVENOUS | Status: AC
  Filled 2021-05-17: qty 1

## 2021-05-17 MED ORDER — MORPHINE SULFATE (PF) 4 MG/ML IV SOLN: 4.0000 mg | Freq: Once | INTRAVENOUS | Status: DC

## 2021-05-17 MED ORDER — DEXAMETHASONE SODIUM PHOSPHATE 10 MG/ML IJ SOLN
INTRAMUSCULAR | Status: AC
  Filled 2021-05-17: qty 1

## 2021-05-17 MED ORDER — EPHEDRINE SULFATE-NACL 50-0.9 MG/10ML-% IV SOSY
PREFILLED_SYRINGE | INTRAVENOUS | Status: DC | PRN
  Administered 2021-05-17: 2.5 mg via INTRAVENOUS

## 2021-05-17 MED ORDER — 0.9 % SODIUM CHLORIDE (POUR BTL) OPTIME
TOPICAL | Status: DC | PRN
  Administered 2021-05-17: 1000 mL

## 2021-05-17 MED ORDER — TRAZODONE HCL 100 MG PO TABS
100.0000 mg | ORAL_TABLET | Freq: Every day | ORAL | Status: DC
  Administered 2021-05-19: 100 mg via ORAL
  Filled 2021-05-17 (×5): qty 1

## 2021-05-17 MED ORDER — ROCURONIUM 10MG/ML (10ML) SYRINGE FOR MEDFUSION PUMP - OPTIME
INTRAVENOUS | Status: DC | PRN
  Administered 2021-05-17: 100 mg via INTRAVENOUS

## 2021-05-17 MED ORDER — METHOCARBAMOL 1000 MG/10ML IJ SOLN
750.0000 mg | Freq: Four times a day (QID) | INTRAVENOUS | Status: DC
  Filled 2021-05-17: qty 7.5

## 2021-05-17 SURGICAL SUPPLY — 62 items
APL PRP STRL LF DISP 70% ISPRP (MISCELLANEOUS) ×1
APL SKNCLS STERI-STRIP NONHPOA (GAUZE/BANDAGES/DRESSINGS) ×1
BAG COUNTER SPONGE SURGICOUNT (BAG) ×2 IMPLANT
BAG SPNG CNTER NS LX DISP (BAG) ×1
BENZOIN TINCTURE PRP APPL 2/3 (GAUZE/BANDAGES/DRESSINGS) ×1 IMPLANT
BIT DRILL FLEXIBLE LONG 12 (BIT) ×1 IMPLANT
BIT DRILL LONG 4.2 (BIT) ×1 IMPLANT
BIT DRILL SHORT 4.2 (BIT) IMPLANT
BLADE SURG 10 STRL SS (BLADE) ×2 IMPLANT
BNDG COHESIVE 4X5 TAN STRL (GAUZE/BANDAGES/DRESSINGS) ×1 IMPLANT
BNDG ELASTIC 4X5.8 VLCR STR LF (GAUZE/BANDAGES/DRESSINGS) ×2 IMPLANT
BNDG ELASTIC 6X5.8 VLCR STR LF (GAUZE/BANDAGES/DRESSINGS) ×2 IMPLANT
BNDG GAUZE ELAST 4 BULKY (GAUZE/BANDAGES/DRESSINGS) ×1 IMPLANT
BRUSH SCRUB EZ PLAIN DRY (MISCELLANEOUS) ×4 IMPLANT
CHLORAPREP W/TINT 26 (MISCELLANEOUS) ×2 IMPLANT
COVER SURGICAL LIGHT HANDLE (MISCELLANEOUS) ×3 IMPLANT
DRAPE C-ARM 42X72 X-RAY (DRAPES) ×2 IMPLANT
DRAPE C-ARMOR (DRAPES) ×2 IMPLANT
DRAPE HALF SHEET 40X57 (DRAPES) ×2 IMPLANT
DRAPE IMP U-DRAPE 54X76 (DRAPES) ×2 IMPLANT
DRAPE INCISE IOBAN 66X45 STRL (DRAPES) ×1 IMPLANT
DRAPE ORTHO SPLIT 77X108 STRL (DRAPES) ×4
DRAPE SURG ORHT 6 SPLT 77X108 (DRAPES) ×2 IMPLANT
DRAPE U-SHAPE 47X51 STRL (DRAPES) ×2 IMPLANT
DRILL BIT SHORT 4.2 (BIT) ×4
DRSG ADAPTIC 3X8 NADH LF (GAUZE/BANDAGES/DRESSINGS) ×1 IMPLANT
ELECT REM PT RETURN 9FT ADLT (ELECTROSURGICAL) ×2
ELECTRODE REM PT RTRN 9FT ADLT (ELECTROSURGICAL) ×1 IMPLANT
GAUZE SPONGE 4X4 12PLY STRL (GAUZE/BANDAGES/DRESSINGS) ×2 IMPLANT
GLOVE BIO SURGEON STRL SZ 6.5 (GLOVE) ×6 IMPLANT
GLOVE BIO SURGEON STRL SZ7.5 (GLOVE) ×8 IMPLANT
GLOVE BIOGEL PI IND STRL 6.5 (GLOVE) ×1 IMPLANT
GLOVE BIOGEL PI IND STRL 7.5 (GLOVE) ×1 IMPLANT
GLOVE BIOGEL PI INDICATOR 6.5 (GLOVE) ×1
GLOVE BIOGEL PI INDICATOR 7.5 (GLOVE) ×1
GOWN STRL REUS W/ TWL LRG LVL3 (GOWN DISPOSABLE) ×2 IMPLANT
GOWN STRL REUS W/TWL LRG LVL3 (GOWN DISPOSABLE) ×4
GUIDEWIRE 3.2X400 (WIRE) ×1 IMPLANT
KIT BASIN OR (CUSTOM PROCEDURE TRAY) ×2 IMPLANT
KIT TURNOVER KIT B (KITS) ×2 IMPLANT
NAIL TFNA 10X330 (Nail) ×1 IMPLANT
PACK TOTAL JOINT (CUSTOM PROCEDURE TRAY) ×2 IMPLANT
PAD ARMBOARD 7.5X6 YLW CONV (MISCELLANEOUS) ×3 IMPLANT
PAD CAST 4YDX4 CTTN HI CHSV (CAST SUPPLIES) IMPLANT
PADDING CAST COTTON 4X4 STRL (CAST SUPPLIES) ×2
PADDING CAST COTTON 6X4 STRL (CAST SUPPLIES) ×1 IMPLANT
REAMER ROD 3.8 BALL TIP 3X950 (ORTHOPEDIC DISPOSABLE SUPPLIES) ×1 IMPLANT
SCREW LOCK IM 44X5XLOPRFL NS (Screw) IMPLANT
SCREW LOCK IM 68X5XLOPRFL NS (Screw) IMPLANT
SCREW LOCK IM NAIL 5X44 (Screw) ×4 IMPLANT
SCREW LOCK IM NAIL 5X68 (Screw) ×2 IMPLANT
SCREW LOCK LP 5.36 (Screw) ×1 IMPLANT
STAPLER VISISTAT 35W (STAPLE) ×1 IMPLANT
STRIP CLOSURE SKIN 1/2X4 (GAUZE/BANDAGES/DRESSINGS) ×1 IMPLANT
SUT MNCRL AB 3-0 PS2 18 (SUTURE) ×2 IMPLANT
SUT VIC AB 0 CT1 27 (SUTURE) ×2
SUT VIC AB 0 CT1 27XBRD ANBCTR (SUTURE) IMPLANT
SUT VIC AB 2-0 CT1 27 (SUTURE) ×2
SUT VIC AB 2-0 CT1 TAPERPNT 27 (SUTURE) IMPLANT
TOWEL GREEN STERILE (TOWEL DISPOSABLE) ×3 IMPLANT
TOWEL GREEN STERILE FF (TOWEL DISPOSABLE) ×2 IMPLANT
YANKAUER SUCT BULB TIP NO VENT (SUCTIONS) ×1 IMPLANT

## 2021-05-17 NOTE — Anesthesia Procedure Notes (Signed)
Procedure Name: Intubation ?Date/Time: 05/17/2021 1:32 PM ?Performed by: Claris Che, CRNA ?Pre-anesthesia Checklist: Patient identified, Emergency Drugs available, Suction available, Patient being monitored and Timeout performed ?Patient Re-evaluated:Patient Re-evaluated prior to induction ?Oxygen Delivery Method: Circle system utilized ?Preoxygenation: Pre-oxygenation with 100% oxygen ?Induction Type: IV induction and Cricoid Pressure applied ?Ventilation: Mask ventilation without difficulty ?Laryngoscope Size: Mac and 4 ?Grade View: Grade II ?Tube type: Oral ?Tube size: 8.0 mm ?Number of attempts: 1 ?Airway Equipment and Method: Stylet ?Placement Confirmation: ETT inserted through vocal cords under direct vision, positive ETCO2 and breath sounds checked- equal and bilateral ?Secured at: 23 cm ?Tube secured with: Tape ?Dental Injury: Teeth and Oropharynx as per pre-operative assessment  ? ? ? ? ?

## 2021-05-17 NOTE — ED Notes (Signed)
C collar removed by Dr Grandville Silos ?
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
## 2021-05-17 NOTE — TOC CAGE-AID Note (Signed)
Transition of Care (TOC) - CAGE-AID Screening ? ? ?Patient Details  ?Name: Richard Vasquez ?MRN: [id number] ?Date of Birth: 1961-01-28 ? ?Transition of Care (TOC) CM/SW Contact:    ?Marcellius Montagna C Tarpley-Carter, LCSWA ?Phone Number: ?05/17/2021, 2:58 PM ? ? ?Clinical Narrative: ?Pt is unable to participate in Cage Aid. ?Pt is currently in surgery.  CSW will assess at a better time. ? ?Passenger transport manager, MSW, LCSW-A ?Pronouns:  She/Her/Hers ?[facility] HealthTransitions of Care ?Clinical Social Worker ?Direct Number:  [phone] ?[email or web address] ? ?CAGE-AID Screening: ?Substance Abuse Screening unable to be completed due to: : Patient unable to participate ? ?  ?  ?  ?  ?  ? ?  ? ?  ? ? ? ? ? ? ?

## 2021-05-17 NOTE — Anesthesia Postprocedure Evaluation (Signed)
Anesthesia Post Note ? ?Patient: [name] ? ?Procedure(s) Performed: INTRAMEDULLARY NAILING OF LEFT TIBIA, STRESS EXAM OF PELVIS (Left: Leg Lower) ? ?  ? ?Patient location during evaluation: PACU ?Anesthesia Type: General ?Level of consciousness: awake ?Pain management: pain level controlled ?Vital Signs Assessment: post-procedure vital signs reviewed and stable ?Respiratory status: spontaneous breathing, nonlabored ventilation, respiratory function stable and patient connected to nasal cannula oxygen ?Cardiovascular status: blood pressure returned to baseline and stable ?Postop Assessment: no apparent nausea or vomiting ?Anesthetic complications: no ? ? ?No notable events documented. ? ?Last Vitals:  ?Vitals:  ? 05/17/21 1629 05/17/21 1948  ?BP: 101/79 94/74  ?Pulse: 100 99  ?Resp: 17 17  ?Temp: 37.3 ?C 36.7 ?C  ?SpO2: 92% (!) 89%  ?  ?Last Pain:  ?Vitals:  ? 05/17/21 2012  ?TempSrc:   ?PainSc: 8   ? ? ?  ?  ?  ?  ?  ?  ? ?Enzo Treu P Yusuf Yu ? ? ? ? ?

## 2021-05-17 NOTE — Anesthesia Postprocedure Evaluation (Signed)
Anesthesia Post Note ? ?Patient: ILYAAS MUSTO ? ?Procedure(s) Performed: INTRAMEDULLARY NAILING OF LEFT TIBIA, STRESS EXAM OF PELVIS (Left: Leg Lower) ? ?  ? ?Patient location during evaluation: PACU ?Anesthesia Type: General ?Level of consciousness: awake ?Pain management: pain level controlled ?Vital Signs Assessment: post-procedure vital signs reviewed and stable ?Respiratory status: spontaneous breathing, nonlabored ventilation, respiratory function stable and patient connected to nasal cannula oxygen ?Cardiovascular status: blood pressure returned to baseline and stable ?Postop Assessment: no apparent nausea or vomiting ?Anesthetic complications: no ? ? ?No notable events documented. ? ?Last Vitals:  ?Vitals:  ? 05/17/21 1629 05/17/21 1948  ?BP: 101/79 94/74  ?Pulse: 100 99  ?Resp: 17 17  ?Temp: 37.3 ?C 36.7 ?C  ?SpO2: 92% (!) 89%  ?  ?Last Pain:  ?Vitals:  ? 05/17/21 2012  ?TempSrc:   ?PainSc: 8   ? ? ?  ?  ?  ?  ?  ?  ? ?Maayan Jenning P Myleah Cavendish ? ? ? ? ?

## 2021-05-17 NOTE — Anesthesia Preprocedure Evaluation (Addendum)
Anesthesia Evaluation  ?Patient identified by MRN, date of birth, ID band ?Patient awake ? ? ? ?Reviewed: ?Allergy & Precautions, NPO status , Patient's Chart, lab work & pertinent test results ? ?Airway ?Mallampati: II ? ?TM Distance: >3 FB ?Neck ROM: Full ? ? ? Dental ? ?(+) Edentulous Upper, Edentulous Lower ?  ?Pulmonary ?former smoker,  ?  ?Pulmonary exam normal ? ? ? ? ? ? ? Cardiovascular ?hypertension, Pt. on medications ? ?Rhythm:Regular Rate:Tachycardia ? ? ?  ?Neuro/Psych ?negative neurological ROS ? negative psych ROS  ? GI/Hepatic ?GERD  Medicated and Controlled,(+)  ?  ? substance abuse ? ,   ?Endo/Other  ?negative endocrine ROS ? Renal/GU ?negative Renal ROS  ? ?  ?Musculoskeletal ?negative musculoskeletal ROS ?(+)  ? Abdominal ?  ?Peds ? Hematology ?negative hematology ROS ?(+)   ?Anesthesia Other Findings ?Left tibia fracture ? Reproductive/Obstetrics ? ?  ? ? ? ? ? ? ? ? ? ? ? ? ? ?  ?  ? ? ? ? ? ? ? ?Anesthesia Physical ?Anesthesia Plan ? ?ASA: 2 ? ?Anesthesia Plan: General  ? ?Post-op Pain Management:   ? ?Induction: Intravenous ? ?PONV Risk Score and Plan: 2 and Ondansetron, Dexamethasone, Midazolam and Treatment may vary due to age or medical condition ? ?Airway Management Planned: Oral ETT ? ?Additional Equipment:  ? ?Intra-op Plan:  ? ?Post-operative Plan: Extubation in OR ? ?Informed Consent: I have reviewed the patients History and Physical, chart, labs and discussed the procedure including the risks, benefits and alternatives for the proposed anesthesia with the patient or authorized representative who has indicated his/her understanding and acceptance.  ? ? ? ? ? ?Plan Discussed with: CRNA ? ?Anesthesia Plan Comments:   ? ? ? ? ? ? ?Anesthesia Quick Evaluation ? ?

## 2021-05-17 NOTE — Transfer of Care (Signed)
Immediate Anesthesia Transfer of Care Note ? ?Patient: Richard Vasquez ? ?Procedure(s) Performed: INTRAMEDULLARY NAILING OF LEFT TIBIA, STRESS EXAM OF PELVIS (Left: Leg Lower) ? ?Patient Location: PACU ? ?Anesthesia Type:General ? ?Level of Consciousness: awake, alert  and oriented ? ?Airway & Oxygen Therapy: Patient Spontanous Breathing ? ?Post-op Assessment: Report given to RN, Post -op Vital signs reviewed and stable and Patient moving all extremities X 4 ? ?Post vital signs: Reviewed and stable ? ?Last Vitals:  ?Vitals Value Taken Time  ?BP 107/78 05/17/21 1452  ?Temp    ?Pulse 108 05/17/21 1455  ?Resp 17 05/17/21 1455  ?SpO2 100 % 05/17/21 1455  ?Vitals shown include unvalidated device data. ? ?Last Pain:  ?Vitals:  ? 05/17/21 1136  ?TempSrc:   ?PainSc: 8   ?   ? ?  ? ?Complications: No notable events documented. ?

## 2021-05-17 NOTE — ED Notes (Signed)
Patient transported to CT 

## 2021-05-17 NOTE — ED Notes (Signed)
FAST done by Dr Grandville Silos and MD indicated negative ?

## 2021-05-17 NOTE — ED Notes (Signed)
Patient refusing morphine at this time.  ?
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
## 2021-05-17 NOTE — TOC CAGE-AID Note (Signed)
Transition of Care (TOC) - CAGE-AID Screening ? ? ?Patient Details  ?Name: Richard Vasquez ?MRN: [id number] ?Date of Birth: 10/31/1960 ? ?Transition of Care (TOC) CM/SW Contact:    ?Crissa Sowder C Tarpley-Carter, LCSWA ?Phone Number: ?05/17/2021, 10:18 AM ? ? ?Clinical Narrative: ?Pt is unable to participate in Cage Aid. ?Pt currently absent from room.  CSW will assess at a better time. ? ?Passenger transport manager, MSW, LCSW-A ?Pronouns:  She/Her/Hers ?[facility]itions of Care ?Clinical Social Worker ?Direct Number:  [phone] ?[email or web address] ? ?CAGE-AID Screening: ?Substance Abuse Screening unable to be completed due to: : Patient unable to participate ? ?  ?  ?  ?  ?  ? ?  ? ?  ? ? ? ? ? ? ?
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
## 2021-05-17 NOTE — Progress Notes (Signed)
Orthopedic Tech Progress Note ?Patient Details:  ?Richard Vasquez ?1961/01/27 ?[id number] ? ?Ortho Devices ?Type of Ortho Device: CAM walker ?Ortho Device/Splint Location: LLE ?Ortho Device/Splint Interventions: Application, Ordered, Adjustment ?  ?Post Interventions ?Patient Tolerated: Well ? ?[name] ?05/17/2021, 3:39 PM ? ?

## 2021-05-18 ENCOUNTER — Encounter (HOSPITAL_COMMUNITY): Payer: Self-pay | Admitting: Student

## 2021-05-18 LAB — BASIC METABOLIC PANEL
Anion gap: 8 (ref 5–15)
BUN: 20 mg/dL (ref 6–20)
CO2: 22 mmol/L (ref 22–32)
Calcium: 8.1 mg/dL — ABNORMAL LOW (ref 8.9–10.3)
Chloride: 105 mmol/L (ref 98–111)
Creatinine, Ser: 1.23 mg/dL (ref 0.61–1.24)
GFR, Estimated: >60 mL/min (ref >60)
Glucose, Bld: 154 mg/dL — ABNORMAL HIGH (ref 70–99)
Potassium: 3.1 mmol/L — ABNORMAL LOW (ref 3.5–5.1)
Sodium: 135 mmol/L (ref 135–145)

## 2021-05-18 LAB — CBC
HCT: 25.2 % — ABNORMAL LOW (ref 39.0–52.0)
HCT: 25.4 % — ABNORMAL LOW (ref 39.0–52.0)
Hemoglobin: 8.5 g/dL — ABNORMAL LOW (ref 13.0–17.0)
Hemoglobin: 8.7 g/dL — ABNORMAL LOW (ref 13.0–17.0)
MCH: 30.9 pg (ref 26.0–34.0)
MCH: 31.3 pg (ref 26.0–34.0)
MCHC: 33.7 g/dL (ref 30.0–36.0)
MCHC: 34.3 g/dL (ref 30.0–36.0)
MCV: 91.6 fL (ref 80.0–100.0)
MCV: 91.4 fL (ref 80.0–100.0)
Platelets: 99 10*3/uL — ABNORMAL LOW (ref 150–400)
Platelets: 104 10*3/uL — ABNORMAL LOW (ref 150–400)
RBC: 2.75 MIL/uL — ABNORMAL LOW (ref 4.22–5.81)
RBC: 2.78 MIL/uL — ABNORMAL LOW (ref 4.22–5.81)
RDW: 12.8 % (ref 11.5–15.5)
RDW: 12.7 % (ref 11.5–15.5)
WBC: 7.8 10*3/uL (ref 4.0–10.5)
WBC: 10.8 10*3/uL — ABNORMAL HIGH (ref 4.0–10.5)
nRBC: 0 % (ref 0.0–0.2)
nRBC: 0 % (ref 0.0–0.2)

## 2021-05-18 LAB — HEMOGLOBIN AND HEMATOCRIT, BLOOD
HCT: 24.6 % — ABNORMAL LOW (ref 39.0–52.0)
Hemoglobin: 8.8 g/dL — ABNORMAL LOW (ref 13.0–17.0)

## 2021-05-18 MED ORDER — FERROUS SULFATE 325 (65 FE) MG PO TABS
325.0000 mg | ORAL_TABLET | Freq: Two times a day (BID) | ORAL | Status: DC
  Administered 2021-05-18 – 2021-05-21 (×6): 325 mg via ORAL
  Filled 2021-05-18 (×6): qty 1

## 2021-05-18 MED ORDER — OXYCODONE HCL 5 MG PO TABS
10.0000 mg | ORAL_TABLET | ORAL | Status: DC | PRN
  Administered 2021-05-18 – 2021-05-20 (×4): 10 mg via ORAL
  Filled 2021-05-18 (×6): qty 2

## 2021-05-18 MED ORDER — OXYCODONE HCL 5 MG PO TABS
5.0000 mg | ORAL_TABLET | ORAL | Status: DC | PRN
  Administered 2021-05-20 – 2021-05-21 (×2): 5 mg via ORAL
  Filled 2021-05-18 (×2): qty 1

## 2021-05-18 MED ORDER — ASCORBIC ACID 500 MG PO TABS
500.0000 mg | ORAL_TABLET | Freq: Two times a day (BID) | ORAL | Status: DC
  Administered 2021-05-18 – 2021-05-21 (×6): 500 mg via ORAL
  Filled 2021-05-18 (×6): qty 1

## 2021-05-18 MED ORDER — SODIUM CHLORIDE 0.9 % IV SOLN: Freq: Once | INTRAVENOUS | Status: AC

## 2021-05-18 MED ORDER — POTASSIUM CHLORIDE CRYS ER 20 MEQ PO TBCR
40.0000 meq | EXTENDED_RELEASE_TABLET | Freq: Once | ORAL | Status: AC
  Administered 2021-05-18: 40 meq via ORAL
  Filled 2021-05-18: qty 2

## 2021-05-18 MED ORDER — KETOROLAC TROMETHAMINE 15 MG/ML IJ SOLN
30.0000 mg | Freq: Four times a day (QID) | INTRAMUSCULAR | Status: DC | PRN
  Administered 2021-05-18 – 2021-05-19 (×3): 30 mg via INTRAVENOUS
  Filled 2021-05-18 (×4): qty 2

## 2021-05-18 NOTE — Progress Notes (Signed)
? ?  Inpatient Rehab Admissions Coordinator : ? ?Per therapy recommendations, patient was screened for CIR candidacy by Yoshika Vensel RN MSN.  At this time patient appears to be a potential candidate for CIR. I will place a rehab consult per protocol for full assessment. Please call me with any questions. ? ?Trentan Trippe RN MSN ?Admissions Coordinator ?[phone] ?  ?

## 2021-05-18 NOTE — Progress Notes (Signed)
Orthopaedic Trauma Progress Note ? ?SUBJECTIVE: Doing ok this AM. Reports intermittent, mild, moderate pain through left lower leg. Slight discomfort in the pelvis. No chest pain. No SOB. No nausea/vomiting. No other complaints. Wants to try mobilizing with therapies today ? ?OBJECTIVE:  ?Vitals:  ? 05/18/21 0523 05/18/21 0805  ?BP: (!) 84/50 132/69  ?Pulse:  (!) 107  ?Resp:  17  ?Temp:  98.2 ?F (36.8 ?C)  ?SpO2:  93%  ? ? ?General: Sitting up in bed, NAD ?Respiratory: No increased work of breathing.  ?LLE: Dressing CDI. Boot in place. Tender across posterior pelvis. Calf swollen but compressible. Tender about the knee. Tolerates gentle ankle ROM. Able to wiggle toes. +DP pulse ? ?IMAGING: Stable post op imaging.  ? ?LABS:  ?Results for orders placed or performed during the hospital encounter of 05/17/21 (from the past 24 hour(s))  ?Sample to Blood Bank     Status: None  ? Collection Time: 05/17/21  9:41 AM  ?Result Value Ref Range  ? Blood Bank Specimen SAMPLE AVAILABLE FOR TESTING   ? Sample Expiration    ?  05/18/2021,2359 ?Performed at [facility], [place] [address] [place], [place] [address] ?  ?Resp Panel by RT-PCR (Flu A&B, Covid) Nasopharyngeal Swab     Status: None  ? Collection Time: 05/17/21  9:42 AM  ? Specimen: Nasopharyngeal Swab; Nasopharyngeal(NP) swabs in vial transport medium  ?Result Value Ref Range  ? SARS Coronavirus 2 by RT PCR NEGATIVE NEGATIVE  ? Influenza A by PCR NEGATIVE NEGATIVE  ? Influenza B by PCR NEGATIVE NEGATIVE  ?I-Stat Chem 8, ED     Status: Abnormal  ? Collection Time: 05/17/21  9:48 AM  ?Result Value Ref Range  ? Sodium 138 135 - 145 mmol/L  ? Potassium 3.5 3.5 - 5.1 mmol/L  ? Chloride 105 98 - 111 mmol/L  ? BUN 17 6 - 20 mg/dL  ? Creatinine, Ser 1.10 0.61 - 1.24 mg/dL  ? Glucose, Bld 97 70 - 99 mg/dL  ? Calcium, Ion 1.07 (L) 1.15 - 1.40 mmol/L  ? TCO2 22 22 - 32 mmol/L  ? Hemoglobin 14.3 13.0 - 17.0 g/dL  ? HCT 42.0 39.0 - 52.0 %  ?Comprehensive metabolic panel     Status:  Abnormal  ? Collection Time: 05/17/21  9:50 AM  ?Result Value Ref Range  ? Sodium 140 135 - 145 mmol/L  ? Potassium 3.8 3.5 - 5.1 mmol/L  ? Chloride 104 98 - 111 mmol/L  ? CO2 23 22 - 32 mmol/L  ? Glucose, Bld 101 (H) 70 - 99 mg/dL  ? BUN 15 6 - 20 mg/dL  ? Creatinine, Ser 1.31 (H) 0.61 - 1.24 mg/dL  ? Calcium 9.6 8.9 - 10.3 mg/dL  ? Total Protein 7.2 6.5 - 8.1 g/dL  ? Albumin 4.3 3.5 - 5.0 g/dL  ? AST 159 (H) 15 - 41 U/L  ? ALT 74 (H) 0 - 44 U/L  ? Alkaline Phosphatase 56 38 - 126 U/L  ? Total Bilirubin 0.9 0.3 - 1.2 mg/dL  ? GFR, Estimated >60 >60 mL/min  ? Anion gap 13 5 - 15  ?CBC     Status: Abnormal  ? Collection Time: 05/17/21  9:50 AM  ?Result Value Ref Range  ? WBC 13.5 (H) 4.0 - 10.5 K/uL  ? RBC 4.63 4.22 - 5.81 MIL/uL  ? Hemoglobin 14.6 13.0 - 17.0 g/dL  ? HCT 43.6 39.0 - 52.0 %  ? MCV 94.2 80.0 - 100.0  fL  ? MCH 31.5 26.0 - 34.0 pg  ? MCHC 33.5 30.0 - 36.0 g/dL  ? RDW 12.8 11.5 - 15.5 %  ? Platelets 184 150 - 400 K/uL  ? nRBC 0.0 0.0 - 0.2 %  ?Ethanol     Status: None  ? Collection Time: 05/17/21  9:50 AM  ?Result Value Ref Range  ? Alcohol, Ethyl (B) <10 <10 mg/dL  ?Lactic acid, plasma     Status: None  ? Collection Time: 05/17/21  9:50 AM  ?Result Value Ref Range  ? Lactic Acid, Venous 1.8 0.5 - 1.9 mmol/L  ?Protime-INR     Status: None  ? Collection Time: 05/17/21  9:50 AM  ?Result Value Ref Range  ? Prothrombin Time 12.8 11.4 - 15.2 seconds  ? INR 1.0 0.8 - 1.2  ?Surgical pcr screen     Status: None  ? Collection Time: 05/17/21 11:25 AM  ? Specimen: Nasal Mucosa; Nasal Swab  ?Result Value Ref Range  ? MRSA, PCR NEGATIVE NEGATIVE  ? Staphylococcus aureus NEGATIVE NEGATIVE  ?HIV Antibody (routine testing w rflx)     Status: None  ? Collection Time: 05/17/21  4:37 PM  ?Result Value Ref Range  ? HIV Screen 4th Generation wRfx Non Reactive Non Reactive  ?VITAMIN D 25 Hydroxy (Vit-D Deficiency, Fractures)     Status: None  ? Collection Time: 05/17/21  4:37 PM  ?Result Value Ref Range  ? Vit D, 25-Hydroxy  47.30 30 - 100 ng/mL  ?CBC     Status: Abnormal  ? Collection Time: 05/18/21  3:05 AM  ?Result Value Ref Range  ? WBC 7.8 4.0 - 10.5 K/uL  ? RBC 2.75 (L) 4.22 - 5.81 MIL/uL  ? Hemoglobin 8.5 (L) 13.0 - 17.0 g/dL  ? HCT 25.2 (L) 39.0 - 52.0 %  ? MCV 91.6 80.0 - 100.0 fL  ? MCH 30.9 26.0 - 34.0 pg  ? MCHC 33.7 30.0 - 36.0 g/dL  ? RDW 12.8 11.5 - 15.5 %  ? Platelets 99 (L) 150 - 400 K/uL  ? nRBC 0.0 0.0 - 0.2 %  ?Basic metabolic panel     Status: Abnormal  ? Collection Time: 05/18/21  3:05 AM  ?Result Value Ref Range  ? Sodium 135 135 - 145 mmol/L  ? Potassium 3.1 (L) 3.5 - 5.1 mmol/L  ? Chloride 105 98 - 111 mmol/L  ? CO2 22 22 - 32 mmol/L  ? Glucose, Bld 154 (H) 70 - 99 mg/dL  ? BUN 20 6 - 20 mg/dL  ? Creatinine, Ser 1.23 0.61 - 1.24 mg/dL  ? Calcium 8.1 (L) 8.9 - 10.3 mg/dL  ? GFR, Estimated >60 >60 mL/min  ? Anion gap 8 5 - 15  ?Hemoglobin and hematocrit, blood     Status: Abnormal  ? Collection Time: 05/18/21  7:33 AM  ?Result Value Ref Range  ? Hemoglobin 8.8 (L) 13.0 - 17.0 g/dL  ? HCT 24.6 (L) 39.0 - 52.0 %  ? ? ?ASSESSMENT: Richard Vasquez is a 61 y.o. male, 1 Day Post-Op s/p ?INTRAMEDULLARY NAILING OF LEFT TIBIA ?STRESS EXAM OF LATERAL COMPRESSION PELVIC RING INJURY WITH NON-OP MANAGEMENT  ? ?CV/Blood loss: Acute blood loss anemia, Hgb 8.8 this AM. Hemodynamically stable currently ? ?PLAN: ?Weightbearing: WBAT RLE and LLE ?ROM: Ok for unrestricted hip and knee motion as tolerated  ?Incisional and dressing care: OK to remove dressings LLE on 05/19/21 and leave open to air with dry gauze PRN  ?Showering: Hold off on  showering until dressing removed ?Orthopedic device(s): CAM boot LLE ?Pain management: Try to limit narcotics ?1. Tylenol 1000 mg q 6 hours scheduled ?2. Robaxin 750 mg QID ?3. Fentanyl 25 mcg q 2 hours PRN ?4. Gabapentin 100 mg TID ?5. Toradol 30 mg q 6 hours PRN ?VTE prophylaxis: Lovenox, SCDs ?ID:  Ancef 2gm post op ?Foley/Lines:  No foley, KVO IVFs ?Impediments to Fracture Healing: Vit D level  47, no supplementation indicated ?Dispo: PT/OT eval today, dispo pending. Plan to remove dressing LLE tomorrow 05/19/21 ? ?D/C recommendations: ?- Robaxin, Tylenol for pain control ?- ASA 325 mg BID x 30 days for DVT prophylaxis ?- No Vit D supplementation needed ? ?Follow - up plan: 2 weeks after d/c for wound check and repeat x-rays ? ? ?Contact information:  Katha Hamming MD, Rushie Nyhan PA-C. After hours and holidays please check [email or web address] for group call information for [facility] ? ? ?Gwinda Passe, PA-C ?[phone] (office) ?[email or web address] ? ? ? ?
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
## 2021-05-18 NOTE — Progress Notes (Signed)
Ortho office called and informed BP 84/50 manual and that HBG dropped to 8.5 awaiting call back ?

## 2021-05-18 NOTE — Plan of Care (Signed)
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
## 2021-05-19 LAB — CBC
HCT: 23 % — ABNORMAL LOW (ref 39.0–52.0)
Hemoglobin: 7.5 g/dL — ABNORMAL LOW (ref 13.0–17.0)
MCH: 30.7 pg (ref 26.0–34.0)
MCHC: 32.6 g/dL (ref 30.0–36.0)
MCV: 94.3 fL (ref 80.0–100.0)
Platelets: 79 10*3/uL — ABNORMAL LOW (ref 150–400)
RBC: 2.44 MIL/uL — ABNORMAL LOW (ref 4.22–5.81)
RDW: 13 % (ref 11.5–15.5)
WBC: 6.7 10*3/uL (ref 4.0–10.5)
nRBC: 0 % (ref 0.0–0.2)

## 2021-05-19 LAB — BASIC METABOLIC PANEL
Anion gap: 7 (ref 5–15)
BUN: 13 mg/dL (ref 6–20)
CO2: 23 mmol/L (ref 22–32)
Calcium: 8.2 mg/dL — ABNORMAL LOW (ref 8.9–10.3)
Chloride: 111 mmol/L (ref 98–111)
Creatinine, Ser: 0.86 mg/dL (ref 0.61–1.24)
GFR, Estimated: >60 mL/min (ref >60)
Glucose, Bld: 93 mg/dL (ref 70–99)
Potassium: 3.8 mmol/L (ref 3.5–5.1)
Sodium: 141 mmol/L (ref 135–145)

## 2021-05-19 NOTE — Progress Notes (Signed)
Inpatient Rehab Admissions: ? ?Inpatient Rehab Consult received.  I met with patient and significant other Kieth Brightly at the bedside for rehabilitation assessment and to discuss goals and expectations of an inpatient rehab admission.  Both acknowledged understanding of CIR goals and expectations. Both would prefer pt receive therapy in the [place] area.  TOC made aware. ? ?Signed: ?Gayland Curry, MS, CCC-SLP ?Admissions Coordinator ?[phone] ? ? ?
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
## 2021-05-19 NOTE — Progress Notes (Signed)
Orthopaedic Trauma Progress Note ? ?SUBJECTIVE: Doing ok this AM, sleeping comfortably.  Noted to have significant pain in the left leg last night.  I was contacted by nursing about restarting oxycodone for better pain control.  Following dose of oxycodone 5 mg, patient was able to sleep comfortably throughout the night.  Continues to have slight discomfort in the pelvis as well as intermittent pains to the left leg. No chest pain. No SOB. No nausea/vomiting. No other complaints.  ? ?OBJECTIVE:  ?Vitals:  ? 05/18/21 1944 05/19/21 0438  ?BP: 109/73 100/71  ?Pulse: 95 96  ?Resp: 16 14  ?Temp: 98.2 ?F (36.8 ?C) 98.7 ?F (37.1 ?C)  ?SpO2: 94% 91%  ? ? ?General: Resting comfortably in bed, NAD ?Respiratory: No increased work of breathing.  ?LLE: Dressing removed, incisions CDI with Steri-Strips in place.  Tenderness over the knee and throughout the lower leg as expected.  Soreness with palpation over the left hip and back into the posterior pelvis.  Swelling through the lower leg appropriate. Tolerates gentle ankle ROM, but does endorse some discomfort with this.  Able to wiggle toes. +DP pulse ? ?IMAGING: Stable post op imaging.  ? ?LABS:  ?Results for orders placed or performed during the hospital encounter of 05/17/21 (from the past 24 hour(s))  ?Hemoglobin and hematocrit, blood     Status: Abnormal  ? Collection Time: 05/18/21  7:33 AM  ?Result Value Ref Range  ? Hemoglobin 8.8 (L) 13.0 - 17.0 g/dL  ? HCT 24.6 (L) 39.0 - 52.0 %  ?CBC     Status: Abnormal  ? Collection Time: 05/18/21  1:14 PM  ?Result Value Ref Range  ? WBC 10.8 (H) 4.0 - 10.5 K/uL  ? RBC 2.78 (L) 4.22 - 5.81 MIL/uL  ? Hemoglobin 8.7 (L) 13.0 - 17.0 g/dL  ? HCT 25.4 (L) 39.0 - 52.0 %  ? MCV 91.4 80.0 - 100.0 fL  ? MCH 31.3 26.0 - 34.0 pg  ? MCHC 34.3 30.0 - 36.0 g/dL  ? RDW 12.7 11.5 - 15.5 %  ? Platelets 104 (L) 150 - 400 K/uL  ? nRBC 0.0 0.0 - 0.2 %  ?CBC     Status: Abnormal  ? Collection Time: 05/19/21  1:31 AM  ?Result Value Ref Range  ? WBC 6.7  4.0 - 10.5 K/uL  ? RBC 2.44 (L) 4.22 - 5.81 MIL/uL  ? Hemoglobin 7.5 (L) 13.0 - 17.0 g/dL  ? HCT 23.0 (L) 39.0 - 52.0 %  ? MCV 94.3 80.0 - 100.0 fL  ? MCH 30.7 26.0 - 34.0 pg  ? MCHC 32.6 30.0 - 36.0 g/dL  ? RDW 13.0 11.5 - 15.5 %  ? Platelets 79 (L) 150 - 400 K/uL  ? nRBC 0.0 0.0 - 0.2 %  ?Basic metabolic panel     Status: Abnormal  ? Collection Time: 05/19/21  1:31 AM  ?Result Value Ref Range  ? Sodium 141 135 - 145 mmol/L  ? Potassium 3.8 3.5 - 5.1 mmol/L  ? Chloride 111 98 - 111 mmol/L  ? CO2 23 22 - 32 mmol/L  ? Glucose, Bld 93 70 - 99 mg/dL  ? BUN 13 6 - 20 mg/dL  ? Creatinine, Ser 0.86 0.61 - 1.24 mg/dL  ? Calcium 8.2 (L) 8.9 - 10.3 mg/dL  ? GFR, Estimated >60 >60 mL/min  ? Anion gap 7 5 - 15  ? ? ?ASSESSMENT: Richard Vasquez is a 61 y.o. male, 2 Days Post-Op s/p ?INTRAMEDULLARY NAILING OF LEFT  TIBIA ?STRESS EXAM OF LATERAL COMPRESSION PELVIC RING INJURY WITH NON-OP MANAGEMENT  ? ?CV/Blood loss: Acute blood loss anemia, Hgb 7.5 this AM. Hemodynamically stable currently ? ?PLAN: ?Weightbearing: WBAT RLE and LLE ?ROM: Ok for unrestricted hip and knee motion as tolerated  ?Incisional and dressing care: Dressings removed today, incisions may be left open to air ?Showering: Okay to begin showering 05/20/2021, incisions/Steri-Strips may get wet  ?Orthopedic device(s): CAM boot LLE when OOB ?Pain management: Try to limit narcotics ?1. Tylenol 1000 mg q 6 hours scheduled ?2. Robaxin 750 mg QID ?3. Fentanyl 25 mcg q 2 hours PRN ?4. Gabapentin 100 mg TID ?5. Toradol 30 mg q 6 hours PRN ?6.  Oxycodone 5-10 mg q 4 hours PRN ?VTE prophylaxis: Lovenox, SCDs ?ID:  Ancef 2gm post op completed ?Foley/Lines:  No foley, KVO IVFs ?Impediments to Fracture Healing: Vit D level 47, no supplementation indicated ?Dispo: Therapies as tolerated, PT/OT recommending CIR.  Admission coordinator involved.  Continue to monitor CBC.  Patient stable for discharge from ortho standpoint once cleared by therapies and trauma team ?D/C  recommendations: ?- Robaxin, Tylenol for pain control ?- ASA 325 mg BID x 30 days for DVT prophylaxis ?- No Vit D supplementation needed ? ?Follow - up plan: 2 weeks after d/c for wound check and repeat x-rays ? ? ?Contact information:  Katha Hamming MD, Rushie Nyhan PA-C. After hours and holidays please check [email or web address] for group call information for [facility] ? ? ?Gwinda Passe, PA-C ?[phone] (office) ?[email or web address] ? ? ? ?

## 2021-05-20 LAB — CBC
HCT: 23.3 % — ABNORMAL LOW (ref 39.0–52.0)
Hemoglobin: 7.9 g/dL — ABNORMAL LOW (ref 13.0–17.0)
MCH: 31.9 pg (ref 26.0–34.0)
MCHC: 33.9 g/dL (ref 30.0–36.0)
MCV: 94 fL (ref 80.0–100.0)
Platelets: 95 10*3/uL — ABNORMAL LOW (ref 150–400)
RBC: 2.48 MIL/uL — ABNORMAL LOW (ref 4.22–5.81)
RDW: 13 % (ref 11.5–15.5)
WBC: 9.7 10*3/uL (ref 4.0–10.5)
nRBC: 0 % (ref 0.0–0.2)

## 2021-05-20 LAB — BASIC METABOLIC PANEL
Anion gap: 9 (ref 5–15)
BUN: 10 mg/dL (ref 6–20)
CO2: 23 mmol/L (ref 22–32)
Calcium: 8.4 mg/dL — ABNORMAL LOW (ref 8.9–10.3)
Chloride: 107 mmol/L (ref 98–111)
Creatinine, Ser: 0.85 mg/dL (ref 0.61–1.24)
GFR, Estimated: >60 mL/min (ref >60)
Glucose, Bld: 97 mg/dL (ref 70–99)
Potassium: 4.4 mmol/L (ref 3.5–5.1)
Sodium: 139 mmol/L (ref 135–145)
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
## 2021-05-21 ENCOUNTER — Encounter: Payer: Self-pay | Admitting: Family Medicine

## 2021-05-21 ENCOUNTER — Encounter (HOSPITAL_COMMUNITY): Payer: Self-pay | Admitting: Physical Medicine & Rehabilitation

## 2021-05-21 ENCOUNTER — Inpatient Hospital Stay (HOSPITAL_COMMUNITY)
Admission: RE | Admit: 2021-05-21 | Discharge: 2021-05-30 | DRG: 560 | Disposition: A | Discharge status: Home / Self Care | Payer: Medicaid Other | Source: Intra-hospital | Attending: Physical Medicine & Rehabilitation | Admitting: Physical Medicine & Rehabilitation

## 2021-05-21 DIAGNOSIS — S069X1S Unspecified intracranial injury with loss of consciousness of 30 minutes or less, sequela: Secondary | ICD-10-CM | POA: Diagnosis not present

## 2021-05-21 DIAGNOSIS — S32502D Unspecified fracture of left pubis, subsequent encounter for fracture with routine healing: Secondary | ICD-10-CM | POA: Diagnosis not present

## 2021-05-21 DIAGNOSIS — S32039D Unspecified fracture of third lumbar vertebra, subsequent encounter for fracture with routine healing: Secondary | ICD-10-CM | POA: Diagnosis not present

## 2021-05-21 DIAGNOSIS — M7989 Other specified soft tissue disorders: Secondary | ICD-10-CM | POA: Diagnosis not present

## 2021-05-21 DIAGNOSIS — Z79899 Other long term (current) drug therapy: Secondary | ICD-10-CM

## 2021-05-21 DIAGNOSIS — R4189 Other symptoms and signs involving cognitive functions and awareness: Secondary | ICD-10-CM | POA: Diagnosis present

## 2021-05-21 DIAGNOSIS — K5900 Constipation, unspecified: Secondary | ICD-10-CM | POA: Diagnosis present

## 2021-05-21 DIAGNOSIS — S060XAD Concussion with loss of consciousness status unknown, subsequent encounter: Secondary | ICD-10-CM | POA: Diagnosis not present

## 2021-05-21 DIAGNOSIS — S329XXA Fracture of unspecified parts of lumbosacral spine and pelvis, initial encounter for closed fracture: Secondary | ICD-10-CM | POA: Diagnosis present

## 2021-05-21 DIAGNOSIS — S3210XD Unspecified fracture of sacrum, subsequent encounter for fracture with routine healing: Secondary | ICD-10-CM | POA: Diagnosis not present

## 2021-05-21 DIAGNOSIS — S069X0A Unspecified intracranial injury without loss of consciousness, initial encounter: Secondary | ICD-10-CM

## 2021-05-21 DIAGNOSIS — D62 Acute posthemorrhagic anemia: Secondary | ICD-10-CM | POA: Diagnosis present

## 2021-05-21 DIAGNOSIS — S82402D Unspecified fracture of shaft of left fibula, subsequent encounter for closed fracture with routine healing: Secondary | ICD-10-CM | POA: Diagnosis not present

## 2021-05-21 DIAGNOSIS — F32A Depression, unspecified: Secondary | ICD-10-CM | POA: Diagnosis present

## 2021-05-21 DIAGNOSIS — S82202D Unspecified fracture of shaft of left tibia, subsequent encounter for closed fracture with routine healing: Principal | ICD-10-CM

## 2021-05-21 DIAGNOSIS — S32810S Multiple fractures of pelvis with stable disruption of pelvic ring, sequela: Secondary | ICD-10-CM | POA: Diagnosis not present

## 2021-05-21 DIAGNOSIS — S069X0S Unspecified intracranial injury without loss of consciousness, sequela: Secondary | ICD-10-CM | POA: Diagnosis not present

## 2021-05-21 DIAGNOSIS — I10 Essential (primary) hypertension: Secondary | ICD-10-CM

## 2021-05-21 DIAGNOSIS — R5381 Other malaise: Secondary | ICD-10-CM | POA: Diagnosis present

## 2021-05-21 DIAGNOSIS — F209 Schizophrenia, unspecified: Secondary | ICD-10-CM | POA: Diagnosis present

## 2021-05-21 DIAGNOSIS — F200 Paranoid schizophrenia: Secondary | ICD-10-CM | POA: Diagnosis not present

## 2021-05-21 DIAGNOSIS — J40 Bronchitis, not specified as acute or chronic: Secondary | ICD-10-CM

## 2021-05-21 DIAGNOSIS — S069X1A Unspecified intracranial injury with loss of consciousness of 30 minutes or less, initial encounter: Secondary | ICD-10-CM | POA: Diagnosis not present

## 2021-05-21 DIAGNOSIS — S32501D Unspecified fracture of right pubis, subsequent encounter for fracture with routine healing: Secondary | ICD-10-CM

## 2021-05-21 DIAGNOSIS — R609 Edema, unspecified: Secondary | ICD-10-CM | POA: Diagnosis not present

## 2021-05-21 DIAGNOSIS — E785 Hyperlipidemia, unspecified: Secondary | ICD-10-CM | POA: Diagnosis present

## 2021-05-21 DIAGNOSIS — K5901 Slow transit constipation: Secondary | ICD-10-CM | POA: Diagnosis present

## 2021-05-21 DIAGNOSIS — I700 Atherosclerosis of aorta: Secondary | ICD-10-CM

## 2021-05-21 DIAGNOSIS — F419 Anxiety disorder, unspecified: Secondary | ICD-10-CM | POA: Diagnosis present

## 2021-05-21 DIAGNOSIS — S32029D Unspecified fracture of second lumbar vertebra, subsequent encounter for fracture with routine healing: Secondary | ICD-10-CM | POA: Diagnosis not present

## 2021-05-21 DIAGNOSIS — S069XAA Unspecified intracranial injury with loss of consciousness status unknown, initial encounter: Secondary | ICD-10-CM | POA: Diagnosis present

## 2021-05-21 LAB — CBC
HCT: 25.1 % — ABNORMAL LOW (ref 39.0–52.0)
HCT: 27.4 % — ABNORMAL LOW (ref 39.0–52.0)
Hemoglobin: 8.3 g/dL — ABNORMAL LOW (ref 13.0–17.0)
Hemoglobin: 9.4 g/dL — ABNORMAL LOW (ref 13.0–17.0)
MCH: 31.1 pg (ref 26.0–34.0)
MCH: 32.4 pg (ref 26.0–34.0)
MCHC: 33.1 g/dL (ref 30.0–36.0)
MCHC: 34.3 g/dL (ref 30.0–36.0)
MCV: 94 fL (ref 80.0–100.0)
MCV: 94.5 fL (ref 80.0–100.0)
Platelets: 145 10*3/uL — ABNORMAL LOW (ref 150–400)
Platelets: 167 10*3/uL (ref 150–400)
RBC: 2.67 MIL/uL — ABNORMAL LOW (ref 4.22–5.81)
RBC: 2.9 MIL/uL — ABNORMAL LOW (ref 4.22–5.81)
RDW: 13 % (ref 11.5–15.5)
RDW: 13 % (ref 11.5–15.5)
WBC: 8.9 10*3/uL (ref 4.0–10.5)
WBC: 10.3 10*3/uL (ref 4.0–10.5)
nRBC: 0 % (ref 0.0–0.2)
nRBC: 0.2 % (ref 0.0–0.2)

## 2021-05-21 LAB — CREATININE, SERUM
Creatinine, Ser: 0.78 mg/dL (ref 0.61–1.24)
GFR, Estimated: >60 mL/min (ref >60)

## 2021-05-21 MED ORDER — ENOXAPARIN SODIUM 30 MG/0.3ML IJ SOSY
30.0000 mg | PREFILLED_SYRINGE | Freq: Two times a day (BID) | INTRAMUSCULAR | Status: DC
Start: 2021-05-21 — End: 2021-05-30
  Administered 2021-05-21 – 2021-05-30 (×18): 30 mg via SUBCUTANEOUS
  Filled 2021-05-21 (×18): qty 0.3

## 2021-05-21 MED ORDER — OXYCODONE HCL 5 MG PO TABS
5.0000 mg | ORAL_TABLET | ORAL | Status: DC | PRN
  Administered 2021-05-25: 5 mg via ORAL
  Filled 2021-05-21 (×2): qty 1

## 2021-05-21 MED ORDER — PANTOPRAZOLE SODIUM 40 MG PO TBEC
40.0000 mg | DELAYED_RELEASE_TABLET | Freq: Every day | ORAL | Status: DC
  Administered 2021-05-22 – 2021-05-30 (×9): 40 mg via ORAL
  Filled 2021-05-21 (×9): qty 1

## 2021-05-21 MED ORDER — ONDANSETRON HCL 4 MG/2ML IJ SOLN: 4.0000 mg | Freq: Four times a day (QID) | INTRAMUSCULAR | Status: DC | PRN

## 2021-05-21 MED ORDER — OLANZAPINE 5 MG PO TABS
5.0000 mg | ORAL_TABLET | Freq: Every day | ORAL | Status: DC
  Administered 2021-05-22 – 2021-05-23 (×2): 5 mg via ORAL
  Filled 2021-05-21 (×3): qty 1

## 2021-05-21 MED ORDER — ACETAMINOPHEN 325 MG PO TABS
325.0000 mg | ORAL_TABLET | ORAL | Status: DC | PRN
  Administered 2021-05-22 – 2021-05-29 (×6): 650 mg via ORAL
  Filled 2021-05-21 (×6): qty 2

## 2021-05-21 MED ORDER — METHOCARBAMOL 1000 MG/10ML IJ SOLN
750.0000 mg | Freq: Four times a day (QID) | INTRAMUSCULAR | Status: DC
  Filled 2021-05-21: qty 7.5

## 2021-05-21 MED ORDER — POLYETHYLENE GLYCOL 3350 17 G PO PACK
17.0000 g | PACK | Freq: Every day | ORAL | Status: DC
  Administered 2021-05-22 – 2021-05-28 (×6): 17 g via ORAL
  Filled 2021-05-21 (×8): qty 1

## 2021-05-21 MED ORDER — ROSUVASTATIN CALCIUM 5 MG PO TABS
10.0000 mg | ORAL_TABLET | Freq: Every day | ORAL | Status: DC
  Administered 2021-05-22 – 2021-05-30 (×9): 10 mg via ORAL
  Filled 2021-05-21 (×9): qty 2

## 2021-05-21 MED ORDER — GABAPENTIN 100 MG PO CAPS
100.0000 mg | ORAL_CAPSULE | Freq: Three times a day (TID) | ORAL | Status: DC
  Administered 2021-05-21 – 2021-05-30 (×25): 100 mg via ORAL
  Filled 2021-05-21 (×26): qty 1

## 2021-05-21 MED ORDER — VITAMIN D (ERGOCALCIFEROL) 1.25 MG (50000 UNIT) PO CAPS
50000.0000 [IU] | ORAL_CAPSULE | ORAL | Status: DC
  Administered 2021-05-25 – 2021-05-29 (×2): 50000 [IU] via ORAL
  Filled 2021-05-21 (×2): qty 1

## 2021-05-21 MED ORDER — ASCORBIC ACID 500 MG PO TABS
500.0000 mg | ORAL_TABLET | Freq: Two times a day (BID) | ORAL | Status: DC
  Administered 2021-05-22 – 2021-05-30 (×17): 500 mg via ORAL
  Filled 2021-05-21 (×17): qty 1

## 2021-05-21 MED ORDER — DOCUSATE SODIUM 100 MG PO CAPS
100.0000 mg | ORAL_CAPSULE | Freq: Two times a day (BID) | ORAL | Status: DC
  Administered 2021-05-21 – 2021-05-25 (×8): 100 mg via ORAL
  Filled 2021-05-21 (×8): qty 1

## 2021-05-21 MED ORDER — ONDANSETRON 4 MG PO TBDP: 4.0000 mg | ORAL_TABLET | Freq: Four times a day (QID) | ORAL | Status: DC | PRN

## 2021-05-21 MED ORDER — TRAZODONE HCL 50 MG PO TABS
100.0000 mg | ORAL_TABLET | Freq: Every day | ORAL | Status: DC
  Administered 2021-05-21 – 2021-05-29 (×9): 100 mg via ORAL
  Filled 2021-05-21 (×4): qty 2
  Filled 2021-05-21: qty 1
  Filled 2021-05-21 (×2): qty 2
  Filled 2021-05-21: qty 1
  Filled 2021-05-21: qty 2

## 2021-05-21 MED ORDER — UMECLIDINIUM BROMIDE 62.5 MCG/ACT IN AEPB
1.0000 | INHALATION_SPRAY | Freq: Every day | RESPIRATORY_TRACT | Status: DC
  Administered 2021-05-22 – 2021-05-30 (×8): 1 via RESPIRATORY_TRACT
  Filled 2021-05-21 (×2): qty 7

## 2021-05-21 MED ORDER — MAGNESIUM HYDROXIDE 400 MG/5ML PO SUSP
30.0000 mL | Freq: Once | ORAL | Status: AC
  Administered 2021-05-21: 30 mL via ORAL
  Filled 2021-05-21: qty 30

## 2021-05-21 MED ORDER — POLYETHYLENE GLYCOL 3350 17 G PO PACK
17.0000 g | PACK | Freq: Every day | ORAL | Status: DC
  Administered 2021-05-21: 17 g via ORAL
  Filled 2021-05-21: qty 1

## 2021-05-21 MED ORDER — ENOXAPARIN SODIUM 30 MG/0.3ML IJ SOSY: 30.0000 mg | PREFILLED_SYRINGE | Freq: Two times a day (BID) | INTRAMUSCULAR | Status: DC

## 2021-05-21 MED ORDER — FERROUS SULFATE 325 (65 FE) MG PO TABS
325.0000 mg | ORAL_TABLET | Freq: Two times a day (BID) | ORAL | Status: DC
  Administered 2021-05-22 – 2021-05-30 (×17): 325 mg via ORAL
  Filled 2021-05-21 (×17): qty 1

## 2021-05-21 MED ORDER — ALBUTEROL SULFATE (2.5 MG/3ML) 0.083% IN NEBU
2.5000 mg | INHALATION_SOLUTION | Freq: Four times a day (QID) | RESPIRATORY_TRACT | Status: DC | PRN
Start: 2021-05-21 — End: 2021-05-30

## 2021-05-21 MED ORDER — METHOCARBAMOL 750 MG PO TABS
750.0000 mg | ORAL_TABLET | Freq: Four times a day (QID) | ORAL | Status: DC
Start: 2021-05-21 — End: 2021-05-30
  Administered 2021-05-21 – 2021-05-30 (×34): 750 mg via ORAL
  Filled 2021-05-21 (×34): qty 1

## 2021-05-21 MED ORDER — QUETIAPINE FUMARATE 200 MG PO TABS
200.0000 mg | ORAL_TABLET | Freq: Every day | ORAL | Status: DC
  Administered 2021-05-21 – 2021-05-27 (×6): 200 mg via ORAL
  Filled 2021-05-21 (×2): qty 1
  Filled 2021-05-21: qty 4
  Filled 2021-05-21 (×6): qty 1

## 2021-05-21 MED ORDER — OXYCODONE HCL 5 MG PO TABS
10.0000 mg | ORAL_TABLET | ORAL | Status: DC | PRN
  Administered 2021-05-22 – 2021-05-26 (×6): 10 mg via ORAL
  Filled 2021-05-21 (×6): qty 2
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
## 2021-05-21 NOTE — Progress Notes (Signed)
Patient ID: Richard Vasquez, male   DOB: 12-22-1960, 61 y.o.   MRN: [id number] ? ? ?Patient arrived at 73. Received report on shift change. Patient in bed with call bell within reach.  ? ?
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
## 2021-05-21 NOTE — Progress Notes (Signed)
Inpatient Rehab Admissions Coordinator:  ?There is a bed available for pt in CIR today. [name], PA aware and in agreement. Pt, Richard Vasquez (girlfriend), NSG, and TOC aware. ? ? ?Gayland Curry, MS, CCC-SLP ?Admissions Coordinator ?[phone] ? ?

## 2021-05-21 NOTE — Progress Notes (Signed)
Inpatient Rehabilitation Admission Medication Review by a Pharmacist ? ?A complete drug regimen review was completed for this patient to identify any potential clinically significant medication issues. ? ?High Risk Drug Classes Is patient taking? Indication by Medication  ?Antipsychotic Yes Seroquel --schizophrenia ?Olanzapine--schizophrenia  ?Anticoagulant Yes Lovenox--VTE px  ?Antibiotic No   ?Opioid Yes Oxycodone-severe and moderate pain  ?Antiplatelet No   ?Hypoglycemics/insulin No   ?Vasoactive Medication No   ?Chemotherapy No   ?Other No   ? ? ? ?Type of Medication Issue Identified Description of Issue Recommendation(s)  ?Drug Interaction(s) (clinically significant) ?    ?Duplicate Therapy ?    ?Allergy ?    ?No Medication Administration End Date ?    ?Incorrect Dose ?    ?Additional Drug Therapy Needed ?    ?Significant med changes from prior encounter (inform family/care partners about these prior to discharge). HCTZ currently on hold from acute admission Consider restarting at discharge from CIR  ?Other ?    ? ? ?Clinically significant medication issues were identified that warrant physician communication and completion of prescribed/recommended actions by midnight of the next day:  No ? ?Name of provider notified for urgent issues identified:  ? ?Provider Method of Notification:  ? ? ? ?Pharmacist comments:  ? ?Time spent performing this drug regimen review (minutes):  15 min ? ? ?[name] A. Levada Dy, PharmD, BCPS, FNKF ?Clinical Pharmacist ?[place] ? ? ?05/21/2021 8:43 PM ?
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
## 2021-05-22 ENCOUNTER — Other Ambulatory Visit: Payer: Self-pay

## 2021-05-22 ENCOUNTER — Inpatient Hospital Stay (HOSPITAL_COMMUNITY): Payer: Medicaid Other

## 2021-05-22 DIAGNOSIS — S069X1S Unspecified intracranial injury with loss of consciousness of 30 minutes or less, sequela: Secondary | ICD-10-CM | POA: Diagnosis not present

## 2021-05-22 DIAGNOSIS — M7989 Other specified soft tissue disorders: Secondary | ICD-10-CM

## 2021-05-22 DIAGNOSIS — S32810S Multiple fractures of pelvis with stable disruption of pelvic ring, sequela: Secondary | ICD-10-CM | POA: Diagnosis not present

## 2021-05-22 DIAGNOSIS — R609 Edema, unspecified: Secondary | ICD-10-CM

## 2021-05-22 DIAGNOSIS — D62 Acute posthemorrhagic anemia: Secondary | ICD-10-CM

## 2021-05-22 DIAGNOSIS — K5901 Slow transit constipation: Secondary | ICD-10-CM

## 2021-05-22 LAB — CBC WITH DIFFERENTIAL/PLATELET
Abs Immature Granulocytes: 0.06 10*3/uL (ref 0.00–0.07)
Basophils Absolute: 0 10*3/uL (ref 0.0–0.1)
Basophils Relative: 0 %
Eosinophils Absolute: 0.5 10*3/uL (ref 0.0–0.5)
Eosinophils Relative: 7 %
HCT: 22.9 % — ABNORMAL LOW (ref 39.0–52.0)
Hemoglobin: 7.7 g/dL — ABNORMAL LOW (ref 13.0–17.0)
Immature Granulocytes: 1 %
Lymphocytes Relative: 23 %
Lymphs Abs: 1.6 10*3/uL (ref 0.7–4.0)
MCH: 31.7 pg (ref 26.0–34.0)
MCHC: 33.6 g/dL (ref 30.0–36.0)
MCV: 94.2 fL (ref 80.0–100.0)
Monocytes Absolute: 0.7 10*3/uL (ref 0.1–1.0)
Monocytes Relative: 10 %
Neutro Abs: 4.1 10*3/uL (ref 1.7–7.7)
Neutrophils Relative %: 59 %
Platelets: 152 10*3/uL (ref 150–400)
RBC: 2.43 MIL/uL — ABNORMAL LOW (ref 4.22–5.81)
RDW: 13.2 % (ref 11.5–15.5)
WBC: 7.1 10*3/uL (ref 4.0–10.5)
nRBC: 0.4 % — ABNORMAL HIGH (ref 0.0–0.2)

## 2021-05-22 LAB — COMPREHENSIVE METABOLIC PANEL
ALT: 23 U/L (ref 0–44)
AST: 41 U/L (ref 15–41)
Albumin: 2.7 g/dL — ABNORMAL LOW (ref 3.5–5.0)
Alkaline Phosphatase: 40 U/L (ref 38–126)
Anion gap: 5 (ref 5–15)
BUN: 10 mg/dL (ref 6–20)
CO2: 23 mmol/L (ref 22–32)
Calcium: 8.5 mg/dL — ABNORMAL LOW (ref 8.9–10.3)
Chloride: 109 mmol/L (ref 98–111)
Creatinine, Ser: 0.81 mg/dL (ref 0.61–1.24)
GFR, Estimated: >60 mL/min (ref >60)
Glucose, Bld: 99 mg/dL (ref 70–99)
Potassium: 4.3 mmol/L (ref 3.5–5.1)
Sodium: 137 mmol/L (ref 135–145)
Total Bilirubin: 0.7 mg/dL (ref 0.3–1.2)
Total Protein: 5.9 g/dL — ABNORMAL LOW (ref 6.5–8.1)

## 2021-05-22 MED ORDER — SORBITOL 70 % SOLN
60.0000 mL | Status: AC
  Administered 2021-05-22: 60 mL via ORAL
  Filled 2021-05-22: qty 60

## 2021-05-22 NOTE — Progress Notes (Signed)
Lower extremity venous has been completed.  ? ?Preliminary results in CV Proc.  ? ?Richard Vasquez ?05/22/2021 3:48 PM    ?
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
## 2021-05-22 NOTE — Discharge Instructions (Addendum)
Inpatient Rehab Discharge Instructions ? ?Richard Vasquez ?Discharge date and time: No discharge date for patient encounter.  ? ?Activities/Precautions/ Functional Status: ?Activity: Weightbearing as tolerated with Cam boot left lower extremity when out of bed ?Diet: Regular ?Wound Care: Routine skin checks ?Functional status:  ?___ No restrictions     ___ Walk up steps independently ?___ 24/7 supervision/assistance   ___ Walk up steps with assistance ?___ Intermittent supervision/assistance  ___ Bathe/dress independently ?___ Walk with walker     _x__ Bathe/dress with assistance ?___ Walk Independently    ___ Shower independently ?___ Walk with assistance    ___ Shower with assistance ?___ No alcohol     ___ Return to work/school ________ ? ? ?COMMUNITY REFERRALS UPON DISCHARGE:   ? ?Outpatient: PT     OT    ST     ?            Agency: [facility]  Phone: [phone] ?            Appointment Date/Time: *Please expect follow-up within 7-10 business days to schedule your appointment. If you have not received follow-up, be sure to contact the branch directly.* ? ?Medical Equipment/Items Ordered: rolling walker, 3in1 bedside commode, and wheelchair ?                                                Agency/Supplier: [facility] [phone] ? ? ?Special Instructions: ?No driving smoking or alcohol ? ? ?My questions have been answered and I understand these instructions. I will adhere to these goals and the provided educational materials after my discharge from the hospital. ? ?Patient/Caregiver Signature _______________________________ Date __________ ? ?Clinician Signature _______________________________________ Date __________ ? ?Please bring this form and your medication list with you to all your follow-up doctor's appointments.   ?

## 2021-05-22 NOTE — Progress Notes (Signed)
Occupational Therapy Session Note ? ?Patient Details  ?Name: Richard Vasquez ?MRN: [id number] ?Date of Birth: July 29, 1960 ? ?Today's Date: 05/22/2021 ?OT Individual Time: 1350-1420 ?OT Individual Time Calculation (min): 30 min  ? ? ?Short Term Goals: ?Week 1:  OT Short Term Goal 1 (Week 1): pt will compelte toilet transfer wiht MIN A consistently with LRAD ?OT Short Term Goal 2 (Week 1): Pt will thread BLE into pants to demo improved BLE strength ?OT Short Term Goal 3 (Week 1): Pt will complete 3/3 steps of toileting with MIN A ? ?Skilled Therapeutic Interventions/Progress Updates:  ?1:1. Pt with unrated pain despite asking directly, using prayer as self elected coping strategy. Pt given sock to don as back precautions are for comfort per MD. Pt yanks LLE up into bed, but unable to don sock. Overall pt attention and command following worse in this session than evaluaion. Toral A to don CAM boot. Pt SPT with RW to w/c and transported to gym. Attempted to shoe sock aid but pt unable to follow commands and reporting nausea. Alerted RN. Exited session with pt seated in bed, exit alarm on and call light in reach ? ? ?Therapy Documentation ?Precautions:  ?Precautions ?Precautions: Fall, Back ?Precaution Comments: back precautions for comfort (L2-3 TVP fxs) ?Required Braces or Orthoses: Other Brace ?Other Brace: L foot cam walker boot when OOB ?Restrictions ?Weight Bearing Restrictions: Yes ?RLE Weight Bearing: Weight bearing as tolerated ?LLE Weight Bearing: Weight bearing as tolerated ?Other Position/Activity Restrictions: LLE WBAT in cam boot ?General: ?  ? ? ?Therapy/Group: Individual Therapy ? ?Lowella Dell Billye Nydam ?05/22/2021, 3:14 PM ?
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
## 2021-05-22 NOTE — Progress Notes (Signed)
Patient ID: Richard Vasquez, male   DOB: 06-27-1960, 61 y.o.   MRN: [id number] ? ?SW met with pt and pt s/o in room during lunch time to introduce self, explain role, and discuss discharge process. Pt unable to remain awake during visit. Pt s/o reported that he needed to meditate since today was a long day. She confirms that he will return to boarding home and their will be various friends that can physically help him since she is not able too at this time. ? ?SW will continue to make efforts to complete assessment.  ? ?Loralee Pacas, MSW, LCSWA ?Office: [phone] ?Cell: [phone] ?Fax: [phone]  ?
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
## 2021-05-22 NOTE — Plan of Care (Signed)
?  Problem: RH Memory ?Goal: LTG Patient will use memory compensatory aids to (SLP) ?Description: LTG:  Patient will use memory compensatory aids to recall biographical/new, daily complex information with cues (SLP) ?Flowsheets (Taken 05/22/2021 1359) ?LTG: Patient will use memory compensatory aids to (SLP): Minimal Assistance - Patient > 75% ?  ?Problem: RH Attention ?Goal: LTG Patient will demonstrate this level of attention during functional activites (SLP) ?Description: LTG:  Patient will will demonstrate this level of attention during functional activites (SLP) ?Flowsheets (Taken 05/22/2021 1359) ?Patient will demonstrate during cognitive/linguistic activities the attention type of: Focused ?Patient will demonstrate this level of attention during cognitive/linguistic activities in: Controlled ?LTG: Patient will demonstrate this level of attention during cognitive/linguistic activities with assistance of (SLP): Minimal Assistance - Patient > 75% ?Number of minutes patient will demonstrate attention during cognitive/linguistic activities: 10 ?  ?Problem: RH Awareness ?Goal: LTG: Patient will demonstrate awareness during functional activites type of (SLP) ?Description: LTG: Patient will demonstrate awareness during functional activites type of (SLP) ?Flowsheets (Taken 05/22/2021 1359) ?Patient will demonstrate during cognitive/linguistic activities awareness type of: Emergent ?LTG: Patient will demonstrate awareness during cognitive/linguistic activities with assistance of (SLP): Minimal Assistance - Patient > 75% ?  ?

## 2021-05-22 NOTE — Progress Notes (Signed)
Inpatient Rehabilitation  Patient information reviewed and entered into eRehab system by Raford Brissett Marshe Shrestha, OTR/L.   Information including medical coding, functional ability and quality indicators will be reviewed and updated through discharge.    
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
## 2021-05-23 DIAGNOSIS — D62 Acute posthemorrhagic anemia: Secondary | ICD-10-CM | POA: Diagnosis not present

## 2021-05-23 DIAGNOSIS — S069X1S Unspecified intracranial injury with loss of consciousness of 30 minutes or less, sequela: Secondary | ICD-10-CM | POA: Diagnosis not present

## 2021-05-23 DIAGNOSIS — S32810S Multiple fractures of pelvis with stable disruption of pelvic ring, sequela: Secondary | ICD-10-CM | POA: Diagnosis not present

## 2021-05-23 DIAGNOSIS — K5901 Slow transit constipation: Secondary | ICD-10-CM | POA: Diagnosis not present

## 2021-05-23 MED ORDER — OLANZAPINE 5 MG PO TABS
5.0000 mg | ORAL_TABLET | Freq: Two times a day (BID) | ORAL | Status: DC
Start: 2021-05-23 — End: 2021-05-30
  Administered 2021-05-23 – 2021-05-30 (×14): 5 mg via ORAL
  Filled 2021-05-23 (×15): qty 1

## 2021-05-23 NOTE — Progress Notes (Signed)
Occupational Therapy Session Note ? ?Patient Details  ?Name: Richard Vasquez ?MRN: [id number] ?Date of Birth: [birth year] ? ?Today's Date: 05/23/2021 ?OT Individual Time: 7253-6644 ?OT Individual Time Calculation (min): 30 min  ? ? ?Short Term Goals: ?Week 1:  OT Short Term Goal 1 (Week 1): pt will compelte toilet transfer wiht MIN A consistently with LRAD ?OT Short Term Goal 2 (Week 1): Pt will thread BLE into pants to demo improved BLE strength ?OT Short Term Goal 3 (Week 1): Pt will complete 3/3 steps of toileting with MIN A ? ?Skilled Therapeutic Interventions/Progress Updates:  ?  Pt received from PT with 5/10 pain in his LLE but agreeable to take shower. Reviewed ortho PA note- do not need to cover steri strips for shower. Pt completed ambulatory transfer from his w/c to the bathroom with min A overall. He required min cueing for RW management and sequencing transfer. Mod A to remove LB clothing, including cam boot once sitting. He completed bathing seated with close (S), requiring instruction on lateral leans for LB since he did not have cam boot on to stand. He x2 stood without the boot on despite cueing. Pt was passed off to [place] in the shower.  ? ?Therapy Documentation ?Precautions:  ?Precautions ?Precautions: Fall, Back ?Precaution Comments: back precautions for comfort (L2-3 TP fxs) ?Required Braces or Orthoses: Other Brace ?Other Brace: L CAM boot when OOB ?Restrictions ?Weight Bearing Restrictions: Yes ?RLE Weight Bearing: Weight bearing as tolerated ?LLE Weight Bearing: Weight bearing as tolerated ?Other Position/Activity Restrictions: LLE WBAT in CAM boot ? ?Therapy/Group: Individual Therapy ? ?[name] ?05/23/2021, 6:48 AM ?

## 2021-05-23 NOTE — Progress Notes (Addendum)
Physical Therapy Session Note ? ?Patient Details  ?Name: Richard Vasquez ?MRN: [id number] ?Date of Birth: 14-Dec-1960 ? ?Today's Date: 05/23/2021 ?PT Individual Time: 1300-1400 ?PT Individual Time Calculation (min): 60 min  ? ?Short Term Goals: ?Week 1:  PT Short Term Goal 1 (Week 1): Pt will perform bed mobility without use of bed features and consistent CGA. ?PT Short Term Goal 2 (Week 1): Pt will perform sit<>stand and stand pivot transfers with consistent CGA/ MinA. ?PT Short Term Goal 3 (Week 1): Pt will ambulate at least 30 ft using RW with CGA. ?PT Short Term Goal 4 (Week 1): Pt will initiate gait training. ? ?Skilled Therapeutic Interventions/Progress Updates:  ?   ?Patient in bed upon PT arrival. Patient alert and agreeable to PT session. Patient reported 7/10 L lower extremity and pelvic pain during session, LPN made aware and provided tylenol, pain 5-6/10 at end of session. PT provided repositioning, rest breaks, and distraction as pain interventions throughout session.  ? ?Vitals: 103/77, HR 97 ? ?Ortho PA came by during session, upon inspection of L lower leg, noted moderate edema, significant bruising, and mild erythema, will monitor for changes and LPN made aware.  ? ?Therapeutic Activity: ?Bed Mobility: Patient performed supine to sit with min A for trunk and lower extremity management from a flat bed. Provided verbal cues for bringing his legs off the bed and use of B elbows to push up to sitting for reduced twisting with use of bed rails for pain management. Donned CAM boot to L leg with patient sitting EOB with total A while educating patient on placement and technique. ?Transfers: Patient performed stand pivot bed>w/c with min A-CGA. Provided verbal cues for hand placement, forward weight shift, and reaching back to sit. ? ?Wheelchair Mobility:  ?Patient propelled wheelchair >150 feet and >200 feet with supervision and intermittent min A due to impulsivity and decreased safety awareness. Provided  verbal cues for turning technique, safety awareness, and appropriate speed. Educated patient on use of breaks and provided max cues for appropriate use throughout session. Donned leg rests with total A due to pain levels at this time.  ?Replaced L ELR for longer leg rest for improved support in sitting and applied tubing to brakes due to missing handles for improved leverage with break management.  ? ?Therapeutic Exercise: ?Patient performed the following exercises with verbal and tactile cues for proper technique. ?-B ankle pumps x20 ?-B heel slides 2x10 on R 2x5 on L AAROM, very limited knee flexion on L (>90 deg in sitting) ?-B SLR AAROM 2x5 ? ?Patient in w/c in the room handed off to [place], [place], at end of session.  ? ?Therapy Documentation ?Precautions:  ?Precautions ?Precautions: Fall, Back ?Precaution Comments: back precautions for comfort (L2-3 TP fxs) ?Required Braces or Orthoses: Other Brace ?Other Brace: L CAM boot when OOB ?Restrictions ?Weight Bearing Restrictions: Yes ?RLE Weight Bearing: Weight bearing as tolerated ?LLE Weight Bearing: Weight bearing as tolerated ?Other Position/Activity Restrictions: LLE WBAT in CAM boot ?Agitated Behavior Scale: ?TBI  ?Observation Details ?Observation Environment: CIR ?Start of observation period - Date: 05/23/21 ?Start of observation period - Time: 1430 ?End of observation period - Date: 05/23/21 ?End of observation period - Time: 1506 ?Agitated Behavior Scale (DO NOT LEAVE BLANKS) ?Short attention span, easy distractibility, inability to concentrate: Present to a moderate degree ?Impulsive, impatient, low tolerance for pain or frustration: Present to a slight degree ?Uncooperative, resistant to care, demanding: Absent ?Violent and/or threatening violence toward people or property: Absent ?Explosive  and/or unpredictable anger: Absent ?Rocking, rubbing, moaning, or other self-stimulating behavior: Absent ?Pulling at tubes, restraints, etc.: Absent ?Wandering from  treatment areas: Absent ?Restlessness, pacing, excessive movement: Present to a moderate degree ?Repetitive behaviors, motor, and/or verbal: Absent ?Rapid, loud, or excessive talking: Absent ?Sudden changes of mood: Absent ?Easily initiated or excessive crying and/or laughter: Absent ?Self-abusiveness, physical and/or verbal: Absent ?Agitated behavior scale total score: 19 ? ?Therapy/Group: Individual Therapy ? ?Doreene Burke PT, DPT ? ?05/23/2021, 4:19 PM  ?
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
## 2021-05-23 NOTE — Progress Notes (Signed)
Pt alarm going off, went down to the room to find pt on floor on knees "praying". Unable to get pt off of floor due to noncompliance. After 10 minutes, pt returned to chair. Director and charge nurse notified of patient behavior. Careteam agreed to transfer patient for better observation to 4W. Family updated, pt agreeable to transfer.  ?Sheela Stack, LPN  ?

## 2021-05-23 NOTE — Progress Notes (Addendum)
Discussed pts behavior with P Love, PA. Per Pam give Seroquel before night shift due to patient missing dose last night. Spoke with pt to advise that Pam suggested that we give medication early due to missing dose last night. Pt in agrees take medication early and advised that medication can not be stopped abruptly.  ? ?Pt in room on phone with girlfriend and resting in bed at this time. No further behavior concerns noted.  ?

## 2021-05-23 NOTE — Progress Notes (Signed)
Occupational Therapy TBI Note ? ?Patient Details  ?Name: Richard Vasquez ?MRN: [id number] ?Date of Birth: [birth year] ? ?Today's Date: 05/23/2021 ?OT Individual Time: 6256-3893 ?OT Individual Time Calculation (min): 27 min  ? ? ?Today's Date: 05/23/2021 ?OT Individual Time: 7342-8768 ?OT Individual Time Calculation (min): 27 min  ? ?Short Term Goals: ?Week 1:  OT Short Term Goal 1 (Week 1): pt will compelte toilet transfer wiht MIN A consistently with LRAD ?OT Short Term Goal 2 (Week 1): Pt will thread BLE into pants to demo improved BLE strength ?OT Short Term Goal 3 (Week 1): Pt will complete 3/3 steps of toileting with MIN A ? ?Skilled Therapeutic Interventions/Progress Updates:  ?   ?Pt received in bed with unrated high ain in pelvis, especially in pelvis. Rest/repositioning provided for pain relief ? ?ADL: MAX A for bed mobility with head reliance on bed rails and pt unable to follow commands for improving bed mobiltiy technique from OT. Pt edu re sock aide and reacher for doffing and donning socks. Pt able ot recall use from yesterday with MIN cuing. Pt requires MIN A to don L sock and S for R sock.  ?Pt left at end of session in bed with exit alarm on, call light in reach and all needs met ? ?Session 2: ? ?Pt received in bathroom with unrated pain in LLE/pelvis.  ? ?ADL: ?Pt received with CAM boot on and scoot laterally with MIN A to edge of TTB. Pt completes sit to stand from TTB with MIN A for trasnfer to toilet for dressing with set up for UB and MAX A for LB. Pt would benefit from reacher for LB dressing. Pt completes functional transfers and mobility in hallway in 15-20' bouts with MIN A for power up and CGA once up with VC for proximity to RW.  ? ?Pt left at end of session in recliner with exit alarm on, call light in reach and all needs met ? ? ?Therapy Documentation ? ?Agitated Behavior Scale: ?TBI  ?Observation Details ?Observation Environment: CIR ?Start of observation period - Date: 05/23/21 ?Start of  observation period - Time: 1430 ?End of observation period - Date: 05/23/21 ?End of observation period - Time: 1506 ?Agitated Behavior Scale (DO NOT LEAVE BLANKS) ?Short attention span, easy distractibility, inability to concentrate: Present to a moderate degree ?Impulsive, impatient, low tolerance for pain or frustration: Present to a slight degree ?Uncooperative, resistant to care, demanding: Absent ?Violent and/or threatening violence toward people or property: Absent ?Explosive and/or unpredictable anger: Absent ?Rocking, rubbing, moaning, or other self-stimulating behavior: Absent ?Pulling at tubes, restraints, etc.: Absent ?Wandering from treatment areas: Absent ?Restlessness, pacing, excessive movement: Present to a moderate degree ?Repetitive behaviors, motor, and/or verbal: Absent ?Rapid, loud, or excessive talking: Absent ?Sudden changes of mood: Absent ?Easily initiated or excessive crying and/or laughter: Absent ?Self-abusiveness, physical and/or verbal: Absent ?Agitated behavior scale total score: 19 ? ? ? ?Therapy/Group: individual ? ?Lowella Dell Shantella Blubaugh ?05/23/2021, 3:06 PM ?
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
## 2021-05-23 NOTE — Care Management (Signed)
Inpatient Rehabilitation Center ?Individual Statement of Services ? ?Patient Name:  Richard Vasquez  ?Date:  05/23/2021 ? ?Welcome to the [facility].  Our goal is to provide you with an individualized program based on your diagnosis and situation, designed to meet your specific needs.  With this comprehensive rehabilitation program, you will be expected to participate in at least 3 hours of rehabilitation therapies Monday-Friday, with modified therapy programming on the weekends. ? ?Your rehabilitation program will include the following services:  Physical Therapy (PT), Occupational Therapy (OT), Speech Therapy (ST), 24 hour per day rehabilitation nursing, Therapeutic Recreaction (TR), Psychology, Neuropsychology, Care Coordinator, Rehabilitation Medicine, Nutrition Services, Pharmacy Services, and Other ? ?Weekly team conferences will be held on Tuesdays to discuss your progress.  Your Inpatient Rehabilitation Care Coordinator will talk with you frequently to get your input and to update you on team discussions.  Team conferences with you and your family in attendance may also be held. ? ?Expected length of stay: 9-16 days  ? ?Overall anticipated outcome: Supervision ? ?Depending on your progress and recovery, your program may change. Your Inpatient Rehabilitation Care Coordinator will coordinate services and will keep you informed of any changes. Your Inpatient Rehabilitation Care Coordinator's name and contact numbers are listed  below. ? ?The following services may also be recommended but are not provided by the [facility]:  ?Driving Evaluations ?Home Health Rehabiltiation Services ?Outpatient Rehabilitation Services ?Vocational Rehabilitation ?  ?Arrangements will be made to provide these services after discharge if needed.  Arrangements include referral to agencies that provide these services. ? ?Your insurance has been verified to be:  Medicaid ? ?Your primary doctor  is:  Default Provider ? ?Pertinent information will be shared with your doctor and your insurance company. ? ?Inpatient Rehabilitation Care Coordinator:  Cathleen Corti [phone] or (C) [phone] ? ?Information discussed with and copy given to patient by: Rana Snare, 05/23/2021, 12:56 PM    ?

## 2021-05-23 NOTE — Progress Notes (Signed)
?                                                       PROGRESS NOTE ? ? ?Subjective/Complaints: ?Moved bowels last night. Pain under control. Pleased with therapy yesterday ? ?ROS: Patient denies fever, rash, sore throat, blurred vision, dizziness, nausea, vomiting, diarrhea, cough, shortness of breath or chest pain,   headache, or mood change.  ? ? ?Objective: ?  ?VAS [place] LOWER EXTREMITY VENOUS (DVT) ? ?Result Date: 05/22/2021 ? Lower Venous DVT Study Patient Name:  Richard Vasquez  Date of Exam:   05/22/2021 Medical Rec #: [id number]        Accession #:    [id number] Date of Birth: 05-Nov-1960        Patient Gender: M Patient Age:   61 years Exam Location:  [facility] Procedure:      VAS [place] LOWER EXTREMITY VENOUS (DVT) Referring Phys: Lauraine Rinne --------------------------------------------------------------------------------  Indications: Swelling, and Edema.  Comparison Study: 06/13/18 prior Performing Technologist: Archie Patten RVS  Examination Guidelines: A complete evaluation includes B-mode imaging, spectral Doppler, color Doppler, and power Doppler as needed of all accessible portions of each vessel. Bilateral testing is considered an integral part of a complete examination. Limited examinations for reoccurring indications may be performed as noted. The reflux portion of the exam is performed with the patient in reverse Trendelenburg.  +---------+---------------+---------+-----------+----------+--------------+ RIGHT    CompressibilityPhasicitySpontaneityPropertiesThrombus Aging +---------+---------------+---------+-----------+----------+--------------+ CFV      Full           Yes      Yes                                 +---------+---------------+---------+-----------+----------+--------------+ SFJ      Full                                                        +---------+---------------+---------+-----------+----------+--------------+ FV Prox  Full                                                         +---------+---------------+---------+-----------+----------+--------------+ FV Mid   Full                                                        +---------+---------------+---------+-----------+----------+--------------+ FV DistalFull                                                        +---------+---------------+---------+-----------+----------+--------------+ PFV      Full                                                        +---------+---------------+---------+-----------+----------+--------------+  POP      Full           Yes      Yes                                 +---------+---------------+---------+-----------+----------+--------------+ PTV      Full                                                        +---------+---------------+---------+-----------+----------+--------------+ PERO     Full                                                        +---------+---------------+---------+-----------+----------+--------------+   +---------+---------------+---------+-----------+----------+--------------+ LEFT     CompressibilityPhasicitySpontaneityPropertiesThrombus Aging +---------+---------------+---------+-----------+----------+--------------+ CFV      Full           Yes      Yes                                 +---------+---------------+---------+-----------+----------+--------------+ SFJ      Full                                                        +---------+---------------+---------+-----------+----------+--------------+ FV Prox  Full                                                        +---------+---------------+---------+-----------+----------+--------------+ FV Mid   Full                                                        +---------+---------------+---------+-----------+----------+--------------+ FV DistalFull                                                         +---------+---------------+---------+-----------+----------+--------------+ PFV      Full                                                        +---------+---------------+---------+-----------+----------+--------------+ POP      Full           Yes      Yes                                 +---------+---------------+---------+-----------+----------+--------------+  PTV      Full                                                        +---------+---------------+---------+-----------+----------+--------------+ PERO     Full                                                        +---------+---------------+---------+-----------+----------+--------------+     Summary: BILATERAL: - No evidence of deep vein thrombosis seen in the lower extremities, bilaterally. -No evidence of popliteal cyst, bilaterally.   *See table(s) above for measurements and observations. Electronically signed by Servando Snare MD on 05/22/2021 at 4:03:45 PM.    Final    ?Recent Labs  ?  05/21/21 ?2048 05/22/21 ?2202  ?WBC 10.3 7.1  ?HGB 9.4* 7.7*  ?HCT 27.4* 22.9*  ?PLT 167 152  ? ?Recent Labs  ?  05/21/21 ?2048 05/22/21 ?5427  ?NA  --  137  ?K  --  4.3  ?CL  --  109  ?CO2  --  23  ?GLUCOSE  --  99  ?BUN  --  10  ?CREATININE 0.78 0.81  ?CALCIUM  --  8.5*  ? ? ?Intake/Output Summary (Last 24 hours) at 05/23/2021 0840 ?Last data filed at 05/23/2021 0255 ?Gross per 24 hour  ?Intake 80 ml  ?Output 2350 ml  ?Net -2270 ml  ?  ? ?  ? ?Physical Exam: ?Vital Signs ?Blood pressure 102/74, pulse 90, temperature 98.2 ?F (36.8 ?C), temperature source Oral, resp. rate 18, height '5\' 10"'$  (1.778 m), weight 75.9 kg, SpO2 96 %. ? ?Constitutional: No distress . Vital signs reviewed. ?HEENT: NCAT, EOMI, oral membranes moist ?Neck: supple ?Cardiovascular: RRR without murmur. No JVD    ?Respiratory/Chest: CTA Bilaterally without wheezes or rales. Normal effort    ?GI/Abdomen: BS +, non-tender, non-distended ?Ext: no clubbing, cyanosis, or edema ?Psych:  pleasant and cooperative  ?Skin: left tibia with steristrips, abrasion left shin. Other scabs/abrasions noted including scalp. All CDI ?Neuro:  Pt is alert, oriented to place. Follows basic commands. Normal language. Speech clear. UE 5/5. BLE limited by pain. No sensory findings ?Musculoskeletal: pain in pelvis with attempts at LE movement and bed mobility.   ? ? ?Assessment/Plan: ?1. Functional deficits which require 3+ hours per day of interdisciplinary therapy in a comprehensive inpatient rehab setting. ?Physiatrist is providing close team supervision and 24 hour management of active medical problems listed below. ?Physiatrist and rehab team continue to assess barriers to discharge/monitor patient progress toward functional and medical goals ? ?Care Tool: ? ?Bathing ?   ?Body parts bathed by patient: Right arm, Left arm, Chest, Abdomen, Front perineal area, Right upper leg, Left upper leg, Face  ? Body parts bathed by helper: Right lower leg, Left lower leg, Buttocks ?  ?  ?Bathing assist Assist Level: Minimal Assistance - Patient > 75% ?  ?  ?Upper Body Dressing/Undressing ?Upper body dressing   ?What is the patient wearing?: Pull over shirt ?   ?Upper body assist Assist Level: Supervision/Verbal cueing ?   ?Lower Body Dressing/Undressing ?Lower body dressing ? ? ?   ?What is the patient wearing?: Pants ? ?  ? ?  Lower body assist Assist for lower body dressing: Maximal Assistance - Patient 25 - 49% ?   ? ?Toileting ?Toileting    ?Toileting assist Assist for toileting: Minimal Assistance - Patient > 75% ?  ?  ?Transfers ?Chair/bed transfer ? ?Transfers assist ?   ? ?Chair/bed transfer assist level: Minimal Assistance - Patient > 75% ?  ?  ?Locomotion ?Ambulation ? ? ?Ambulation assist ? ?   ? ?Assist level: Minimal Assistance - Patient > 75% ?Assistive device: Walker-rolling ?Max distance: 10 ft  ? ?Walk 10 feet activity ? ? ?Assist ?   ? ?Assist level: Minimal Assistance - Patient > 75% ?Assistive device:  Walker-rolling  ? ?Walk 50 feet activity ? ? ?Assist Walk 50 feet with 2 turns activity did not occur: Safety/medical concerns ? ?  ?   ? ? ?Walk 150 feet activity ? ? ?Assist Walk 150 feet activity did not occur: Safety/medical concerns ? ?  ?  ?  ? ?Walk 10 feet on uneven surface  ?activity ? ? ?Assist Walk 10 feet on uneven surfaces activity did not

## 2021-05-23 NOTE — Plan of Care (Signed)
?  Problem: RH Balance ?Goal: LTG Patient will maintain dynamic standing balance (PT) ?Description: LTG:  Patient will maintain dynamic standing balance with assistance during mobility activities (PT) ?Flowsheets (Taken 05/22/2021 2007) ?LTG: Pt will maintain dynamic standing balance during mobility activities with:: Supervision/Verbal cueing ?  ?Problem: Sit to Stand ?Goal: LTG:  Patient will perform sit to stand with assistance level (PT) ?Description: LTG:  Patient will perform sit to stand with assistance level (PT) ?Flowsheets (Taken 05/22/2021 2007) ?LTG: PT will perform sit to stand in preparation for functional mobility with assistance level: Supervision/Verbal cueing ?  ?Problem: RH Bed Mobility ?Goal: LTG Patient will perform bed mobility with assist (PT) ?Description: LTG: Patient will perform bed mobility with assistance, with/without cues (PT). ?Flowsheets (Taken 05/22/2021 2007) ?LTG: Pt will perform bed mobility with assistance level of: Independent ?  ?Problem: RH Bed to Chair Transfers ?Goal: LTG Patient will perform bed/chair transfers w/assist (PT) ?Description: LTG: Patient will perform bed to chair transfers with assistance (PT). ?Flowsheets (Taken 05/22/2021 2007) ?LTG: Pt will perform Bed to Chair Transfers with assistance level: Supervision/Verbal cueing ?  ?Problem: RH Car Transfers ?Goal: LTG Patient will perform car transfers with assist (PT) ?Description: LTG: Patient will perform car transfers with assistance (PT). ?Flowsheets (Taken 05/22/2021 2007) ?LTG: Pt will perform car transfers with assist:: Supervision/Verbal cueing ?  ?Problem: RH Furniture Transfers ?Goal: LTG Patient will perform furniture transfers w/assist (OT/PT) ?Description: LTG: Patient will perform furniture transfers  with assistance (OT/PT). ?Flowsheets (Taken 05/22/2021 2007) ?LTG: Pt will perform furniture transfers with assist:: Contact Guard/Touching assist ?  ?Problem: RH Ambulation ?Goal: LTG Patient will ambulate in  controlled environment (PT) ?Description: LTG: Patient will ambulate in a controlled environment, # of feet with assistance (PT). ?Flowsheets (Taken 05/22/2021 2007) ?LTG: Pt will ambulate in controlled environ  assist needed:: Contact Guard/Touching assist ?LTG: Ambulation distance in controlled environment: at least 100 ft using LRAD ?Goal: LTG Patient will ambulate in home environment (PT) ?Description: LTG: Patient will ambulate in home environment, # of feet with assistance (PT). ?Flowsheets (Taken 05/22/2021 2007) ?LTG: Pt will ambulate in home environ  assist needed:: Contact Guard/Touching assist ?LTG: Ambulation distance in home environment: at least 50 ft using LRAD ?  ?Problem: RH Wheelchair Mobility ?Goal: LTG Patient will propel w/c in controlled environment (PT) ?Description: LTG: Patient will propel wheelchair in controlled environment, # of feet with assist (PT) ?Flowsheets (Taken 05/22/2021 2007) ?LTG: Pt will propel w/c in controlled environ  assist needed:: Supervision/Verbal cueing ?LTG: Propel w/c distance in controlled environment: at least 150 ft ?  ?Problem: RH Stairs ?Goal: LTG Patient will ambulate up and down stairs w/assist (PT) ?Description: LTG: Patient will ambulate up and down # of stairs with assistance (PT) ?Flowsheets (Taken 05/22/2021 2007) ?LTG: Pt will ambulate up/down stairs assist needed:: Minimal Assistance - Patient > 75% ?LTG: Pt will  ambulate up and down number of stairs: at least 5 steps using HR setup as per home environment ?  ?

## 2021-05-24 DIAGNOSIS — K5901 Slow transit constipation: Secondary | ICD-10-CM | POA: Diagnosis not present

## 2021-05-24 DIAGNOSIS — D62 Acute posthemorrhagic anemia: Secondary | ICD-10-CM | POA: Diagnosis not present

## 2021-05-24 DIAGNOSIS — S069X1S Unspecified intracranial injury with loss of consciousness of 30 minutes or less, sequela: Secondary | ICD-10-CM | POA: Diagnosis not present

## 2021-05-24 DIAGNOSIS — F200 Paranoid schizophrenia: Secondary | ICD-10-CM

## 2021-05-24 DIAGNOSIS — S32810S Multiple fractures of pelvis with stable disruption of pelvic ring, sequela: Secondary | ICD-10-CM | POA: Diagnosis not present

## 2021-05-24 LAB — CBC
HCT: 24.6 % — ABNORMAL LOW (ref 39.0–52.0)
Hemoglobin: 8.3 g/dL — ABNORMAL LOW (ref 13.0–17.0)
MCH: 31.7 pg (ref 26.0–34.0)
MCHC: 33.7 g/dL (ref 30.0–36.0)
MCV: 93.9 fL (ref 80.0–100.0)
Platelets: 234 10*3/uL (ref 150–400)
RBC: 2.62 MIL/uL — ABNORMAL LOW (ref 4.22–5.81)
RDW: 13 % (ref 11.5–15.5)
WBC: 8.2 10*3/uL (ref 4.0–10.5)
nRBC: 0.2 % (ref 0.0–0.2)

## 2021-05-24 NOTE — Progress Notes (Signed)
Speech Language Pathology TBI Note ? ?Patient Details  ?Name: Richard Vasquez ?MRN: [id number] ?Date of Birth: Dec 31, 1960 ? ?Today's Date: 05/24/2021 ?SLP Individual Time: 0800-0900 ?SLP Individual Time Calculation (min): 60 min ? ?Short Term Goals: ?Week 1: SLP Short Term Goal 1 (Week 1): Pt will complete additional cognitive-linguisitc assessment to further determine POC with 100% completion. ?SLP Short Term Goal 2 (Week 1): Pt will utilize external aids to recall functional, daily information with 100% accuracy given Max A. ?SLP Short Term Goal 3 (Week 1): Pt will complete functional money management task r/t counting bills and coins with 80% accuracy given Mod A. ?SLP Short Term Goal 4 (Week 1): Pt will attend to therapeutic and functional tasks for 3-5 minutes with no more than 3 cues to remain engaged. ? ?Skilled Therapeutic Interventions: ?Pt seen this date for skilled ST intervention targeting cognitive-linguistic goals outlined above. Pt encountered awake/alert and lying semi-reclined in bed. Greeted therapist appropriately upon arrival. Agreeable to ST intervention at bedside.  ?  ?SLP facilitated today's session by implementing the following skilled interventions within the context of functional tx tasks: spaced retrieval and errorless learning principles, corrective feedback, and skilled education re: safety precautions to include increased fall risk. Per chart review, pt found out of bed on his knees praying without staff present.  ?  ?Pt completed functional problem-solving and command following tasks r/t orientation and monthly calendar. Pt correctly filled in the numbers on the calendar with 100% accuracy with Mod I given initial demonstration of task. Followed commands to cross out past days with ~85% accuracy given Min-Mod verbal and visual A; unaware of errors initially and required Mod verbal and visual A, to include comparison, to identify errors made. Total A provided re: use of TV remote and HOB  positioning button on bed given pt would frequently hit the RN button when attempting to elevate HOB. Pt successfully recalled use of HOB positioning button following a 3-minute delay with distraction and utilized independently x 2 to reposition for intake of liquids and breakfast; required Mod verbal and visual A for demonstration of utilize [facility] button by the end of the session. Min-Mod verbal and gesture/visual A for consistent use and navigation of external aids for recall of MD name, RN name, date, and "call, don't fall" sign to recall importance of calling for bathroom + getting out of bed with 100% accuracy. Mod A for date recall. Overall, pt with improved engagement and focused attention to very basic functional tasks; attended for up to 5 minutes with Supervision A.   ?  ?Session concluded with pt in bed, bed alarm on , call bell reviewed and within reach, and all immediate needs met. Continue per current ST POC.  ?  ?Pain ?7/10 pain in LLE; RN aware. Pt utilizing repositioning and prayer for management. ? ?Agitated Behavior Scale: ?TBI ?Observation Details ?Observation Environment: CIR ?Start of observation period - Date: 05/24/21 ?Start of observation period - Time: 0800 ?End of observation period - Date: 05/24/21 ?End of observation period - Time: 0900 ?Agitated Behavior Scale (DO NOT LEAVE BLANKS) ?Short attention span, easy distractibility, inability to concentrate: Present to a slight degree ?Impulsive, impatient, low tolerance for pain or frustration: Present to a slight degree ?Uncooperative, resistant to care, demanding: Absent ?Violent and/or threatening violence toward people or property: Absent ?Explosive and/or unpredictable anger: Absent ?Rocking, rubbing, moaning, or other self-stimulating behavior: Present to a slight degree ?Pulling at tubes, restraints, etc.: Absent ?Wandering from treatment areas: Absent ?Restlessness, pacing, excessive  movement: Present to a slight degree ?Repetitive  behaviors, motor, and/or verbal: Present to a moderate degree ?Rapid, loud, or excessive talking: Absent ?Sudden changes of mood: Absent ?Easily initiated or excessive crying and/or laughter: Absent ?Self-abusiveness, physical and/or verbal: Absent ?Agitated behavior scale total score: 20 ? ?Therapy/Group: Individual Therapy ? ?Benjimen Kelley A Darrielle Pflieger ?05/24/2021, 11:55 AM ?

## 2021-05-24 NOTE — IPOC Note (Signed)
Overall Plan of Care (IPOC) ?Patient Details ?Name: Richard Vasquez ?MRN: [id number] ?DOB: May 09, 1960 ? ?Admitting Diagnosis: Pelvic fracture ([place]) ? ?Hospital Problems: Principal Problem: ?  Pelvic fracture ([place]) ?Active Problems: ?  Constipation ?  TBI (traumatic brain injury) ([place]) ? ? ? ? Functional Problem List: ?Nursing Endurance, Medication Management, Motor, Safety, Skin Integrity, Bowel  ?PT Balance, Behavior, Edema, Endurance, Motor, Nutrition, Pain, Perception, Safety, Skin Integrity, Sensory  ?OT Balance, Behavior, Cognition, Endurance, Pain, Safety, Sensory  ?SLP Cognition  ?TR    ?    ? Basic ADL?s: ?OT Grooming, Bathing, Dressing, Toileting  ? ?  Advanced  ADL?s: ?OT    ?   ?Transfers: ?PT Bed Mobility, Bed to Chair, Car, Furniture  ?OT Toilet, Tub/Shower  ? ?  Locomotion: ?PT Ambulation, Wheelchair Mobility, Stairs  ? ?  Additional Impairments: ?OT    ?SLP Social Cognition ?  ?Social Interaction, Problem Solving, Memory, Attention, Awareness  ?TR    ? ? ?Anticipated Outcomes ?Item Anticipated Outcome  ?Self Feeding no goal  ?Swallowing ? N/A ?  ?Basic self-care ? S  ?Toileting ? S ?  ?Bathroom Transfers S  ?Bowel/Bladder ? supervision  ?Transfers ? supervision  ?Locomotion ? supervision/ CGA  ?Communication ? N/A  ?Cognition ? Min A  ?Pain ? n/a  ?Safety/Judgment ? supervision  ? ?Therapy Plan: ?PT Intensity: Minimum of 1-2 x/day ,45 to 90 minutes ?PT Frequency: 5 out of 7 days ?PT Duration Estimated Length of Stay: 14-16 days ?OT Intensity: Minimum of 1-2 x/day, 45 to 90 minutes ?OT Frequency: 5 out of 7 days ?OT Duration/Estimated Length of Stay: 9-12 ?SLP Intensity: Minumum of 1-2 x/day, 30 to 90 minutes ?SLP Frequency: 3 to 5 out of 7 days ?SLP Duration/Estimated Length of Stay: 9-12 days  ? ?Due to the current state of emergency, patients may not be receiving their 3-hours of Medicare-mandated therapy. ? ? Team Interventions: ?Nursing Interventions Patient/Family Education, Bowel Management,  Disease Management/Prevention, Medication Management, Skin Care/Wound Management, Discharge Planning  ?PT interventions Ambulation/gait training, Discharge planning, Functional mobility training, Psychosocial support, Therapeutic Activities, Visual/perceptual remediation/compensation, Balance/vestibular training, Disease management/prevention, Neuromuscular re-education, Skin care/wound management, Therapeutic Exercise, Wheelchair propulsion/positioning, Cognitive remediation/compensation, Community reintegration, DME/adaptive equipment instruction, Functional electrical stimulation, Patient/family education, Pain management, Stair training, UE/LE Coordination activities, UE/LE Strength taining/ROM  ?OT Interventions Balance/vestibular training, Discharge planning, Pain management, Self Care/advanced ADL retraining, Therapeutic Activities, UE/LE Coordination activities, Visual/perceptual remediation/compensation, Therapeutic Exercise, Skin care/wound managment, Patient/family education, Functional mobility training, Disease mangement/prevention, Cognitive remediation/compensation, Community reintegration, DME/adaptive equipment instruction, Neuromuscular re-education, Psychosocial support, Splinting/orthotics, UE/LE Strength taining/ROM, Wheelchair propulsion/positioning  ?SLP Interventions Cognitive remediation/compensation, Functional tasks, Therapeutic Exercise, Patient/family education, Internal/external aids  ?TR Interventions    ?SW/CM Interventions Discharge Planning, Psychosocial Support, Patient/Family Education  ? ?Barriers to Discharge ?MD  Medical stability  ?Nursing Decreased caregiver support, Home environment access/layout, Wound Care, Lack of/limited family support, Weight bearing restrictions ?Lives in boarding house, 2 level, 2-3 steps, no rail. Patient lives on ground floor. Girlfriend, neighbors, friends to assist at discharge. Girlfriend left foot NWB, has knee scooter. Other caregivers can  provide physical assist.  ?PT Inaccessible home environment, Decreased caregiver support, Home environment access/layout, Wound Care, Lack of/limited family support, Insurance for SNF coverage, Weight bearing restrictions, Behavior, Nutrition means ?   ?OT Decreased caregiver support ?   ?SLP   ?   ?SW   ?   ? ?Team Discharge Planning: ?Destination: PT-Home (Group Home) ,OT-   , SLP-Home ?Projected Follow-up: PT-Home health PT,  Outpatient PT, 24 hour supervision/assistance, OT-  Outpatient OT, SLP-24 hour supervision/assistance, Outpatient SLP, Home Health SLP ?Projected Equipment Needs: PT-To be determined, OT- 3 in 1 bedside comode, Tub/shower bench, To be determined, SLP-None recommended by SLP ?Equipment Details: PT- , OT-  ?Patient/family involved in discharge planning: PT- Patient, Family member/caregiver,  OT-Patient, SLP-Patient, Family member/caregiver ? ?MD ELOS: 10-13 days ?Medical Rehab Prognosis:  Excellent ?Assessment: The patient has been admitted for CIR therapies with the diagnosis of polytrauma, concussion. The team will be addressing functional mobility, strength, stamina, balance, safety, adaptive techniques and equipment, self-care, bowel and bladder mgt, patient and caregiver education, pain mgt, ortho precatuions, wound care, behavior/cognition. Goals have been set at supervision to min assist goals. Anticipated discharge destination is home with sig other. ? ? ?   ? ? ?See Team Conference Notes for weekly updates to the plan of care  ?

## 2021-05-24 NOTE — Progress Notes (Signed)
?                                                       PROGRESS NOTE ? ? ?Subjective/Complaints: ?Pt was apologetic for  behaviors yesterday. 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 Had a good night and feels well this morning. Appreciative of rehab staff. Right leg sore 6/10 ? ?ROS: Patient denies fever, rash, sore throat, blurred vision, dizziness, nausea, vomiting, diarrhea, cough, shortness of breath or chest pain,  headache, or mood change.  ? ? ?Objective: ?  ?VAS [place] LOWER EXTREMITY VENOUS (DVT) ? ?Result Date: 05/22/2021 ? Lower Venous DVT Study Patient Name:  Richard Vasquez  Date of Exam:   05/22/2021 Medical Rec #: [id number]        Accession #:    [id number] Date of Birth: 07-Aug-1960        Patient Gender: M Patient Age:   61 years Exam Location:  [facility] Procedure:      VAS [place] LOWER EXTREMITY VENOUS (DVT) Referring Phys: Lauraine Rinne --------------------------------------------------------------------------------  Indications: Swelling, and Edema.  Comparison Study: 06/13/18 prior Performing Technologist: Archie Patten RVS  Examination Guidelines: A complete evaluation includes B-mode imaging, spectral Doppler, color Doppler, and power Doppler as needed of all accessible portions of each vessel. Bilateral testing is considered an integral part of a complete examination. Limited examinations for reoccurring indications may be performed as noted. The reflux portion of the exam is performed with the patient in reverse Trendelenburg.  +---------+---------------+---------+-----------+----------+--------------+ RIGHT    CompressibilityPhasicitySpontaneityPropertiesThrombus Aging +---------+---------------+---------+-----------+----------+--------------+ CFV      Full           Yes      Yes                                 +---------+---------------+---------+-----------+----------+--------------+ SFJ      Full                                                         +---------+---------------+---------+-----------+----------+--------------+ FV Prox  Full                                                        +---------+---------------+---------+-----------+----------+--------------+ FV Mid   Full                                                        +---------+---------------+---------+-----------+----------+--------------+ FV DistalFull                                                        +---------+---------------+---------+-----------+----------+--------------+  PFV      Full                                                        +---------+---------------+---------+-----------+----------+--------------+ POP      Full           Yes      Yes                                 +---------+---------------+---------+-----------+----------+--------------+ PTV      Full                                                        +---------+---------------+---------+-----------+----------+--------------+ PERO     Full                                                        +---------+---------------+---------+-----------+----------+--------------+   +---------+---------------+---------+-----------+----------+--------------+ LEFT     CompressibilityPhasicitySpontaneityPropertiesThrombus Aging +---------+---------------+---------+-----------+----------+--------------+ CFV      Full           Yes      Yes                                 +---------+---------------+---------+-----------+----------+--------------+ SFJ      Full                                                        +---------+---------------+---------+-----------+----------+--------------+ FV Prox  Full                                                        +---------+---------------+---------+-----------+----------+--------------+ FV Mid   Full                                                         +---------+---------------+---------+-----------+----------+--------------+ FV DistalFull                                                        +---------+---------------+---------+-----------+----------+--------------+ PFV      Full                                                        +---------+---------------+---------+-----------+----------+--------------+  POP      Full           Yes      Yes                                 +---------+---------------+---------+-----------+----------+--------------+ PTV      Full                                                        +---------+---------------+---------+-----------+----------+--------------+ PERO     Full                                                        +---------+---------------+---------+-----------+----------+--------------+     Summary: BILATERAL: - No evidence of deep vein thrombosis seen in the lower extremities, bilaterally. -No evidence of popliteal cyst, bilaterally.   *See table(s) above for measurements and observations. Electronically signed by Servando Snare MD on 05/22/2021 at 4:03:45 PM.    Final    ?Recent Labs  ?  05/22/21 ?2376 05/24/21 ?2831  ?WBC 7.1 8.2  ?HGB 7.7* 8.3*  ?HCT 22.9* 24.6*  ?PLT 152 234  ? ?Recent Labs  ?  05/21/21 ?2048 05/22/21 ?5176  ?NA  --  137  ?K  --  4.3  ?CL  --  109  ?CO2  --  23  ?GLUCOSE  --  99  ?BUN  --  10  ?CREATININE 0.78 0.81  ?CALCIUM  --  8.5*  ? ? ?Intake/Output Summary (Last 24 hours) at 05/24/2021 1038 ?Last data filed at 05/24/2021 0500 ?Gross per 24 hour  ?Intake 480 ml  ?Output 750 ml  ?Net -270 ml  ?  ? ?  ? ?Physical Exam: ?Vital Signs ?Blood pressure 108/72, pulse 91, temperature 97.6 ?F (36.4 ?C), resp. rate 16, height '5\' 10"'$  (1.778 m), weight 75.9 kg, SpO2 95 %. ? ?Constitutional: No distress . Vital signs reviewed. ?HEENT: NCAT, EOMI, oral membranes moist ?Neck: supple ?Cardiovascular: RRR without murmur. No JVD    ?Respiratory/Chest: CTA Bilaterally without  wheezes or rales. Normal effort    ?GI/Abdomen: BS +, non-tender, non-distended ?Ext: no clubbing, cyanosis, or edema ?Psych: pleasant and cooperative, calm/collected  ?Skin: left tibia with steristrips, abrasion left shin. Other scabs/abrasions noted including scalp. All CDI ?Neuro:  Pt is alert, oriented to place. Follows basic commands. Normal language. Speech clear. UE 5/5. BLE limited by pain. No sensory findings ?Musculoskeletal: pain in pelvis with attempts at LE movement and bed mobility.   ? ? ?Assessment/Plan: ?1. Functional deficits which require 3+ hours per day of interdisciplinary therapy in a comprehensive inpatient rehab setting. ?Physiatrist is providing close team supervision and 24 hour management of active medical problems listed below. ?Physiatrist and rehab team continue to assess barriers to discharge/monitor patient progress toward functional and medical goals ? ?Care Tool: ? ?Bathing ?   ?Body parts bathed by patient: Right arm, Left arm, Chest, Abdomen, Front perineal area, Right upper leg, Left upper leg, Face  ? Body parts bathed by helper: Right lower leg, Left lower leg, Buttocks ?  ?  ?Bathing assist Assist Level: Minimal Assistance -  Patient > 75% ?  ?  ?Upper Body Dressing/Undressing ?Upper body dressing   ?What is the patient wearing?: Pull over shirt ?   ?Upper body assist Assist Level: Supervision/Verbal cueing ?   ?Lower Body Dressing/Undressing ?Lower body dressing ? ? ?   ?What is the patient wearing?: Pants ? ?  ? ?Lower body assist Assist for lower body dressing: Maximal Assistance - Patient 25 - 49% ?   ? ?Toileting ?Toileting    ?Toileting assist Assist for toileting: Minimal Assistance - Patient > 75% ?  ?  ?Transfers ?Chair/bed transfer ? ?Transfers assist ?   ? ?Chair/bed transfer assist level: Minimal Assistance - Patient > 75% ?Chair/bed transfer assistive device: Walker ?  ?Locomotion ?Ambulation ? ? ?Ambulation assist ? ?   ? ?Assist level: Minimal Assistance -  Patient > 75% ?Assistive device: Walker-rolling ?Max distance: 10 ft  ? ?Walk 10 feet activity ? ? ?Assist ?   ? ?Assist level: Minimal Assistance - Patient > 75% ?Assistive device: Walker-rolling  ? ?Walk 50 feet activity ? ? ?Assist Walk 50 feet with 2 turns activity did not occur: Safety/medical concerns ? ?  ?   ? ? ?Walk 150 feet activity ? ? ?Assist W

## 2021-05-24 NOTE — Progress Notes (Signed)
Occupational Therapy Session Note ? ?Patient Details  ?Name: Richard Vasquez ?MRN: [id number] ?Date of Birth: February 01, 1960 ? ?Today's Date: 05/24/2021 ?OT Individual Time: 0700-0800 ?OT Individual Time Calculation (min): 60 min  ? ?Today's Date: 05/24/2021 ?OT Individual Time: 2094-7096 ?OT Individual Time Calculation (min): 10 min  ? ?Short Term Goals: ?Week 1:  OT Short Term Goal 1 (Week 1): pt will compelte toilet transfer wiht MIN A consistently with LRAD ?OT Short Term Goal 2 (Week 1): Pt will thread BLE into pants to demo improved BLE strength ?OT Short Term Goal 3 (Week 1): Pt will complete 3/3 steps of toileting with MIN A ? ?Skilled Therapeutic Interventions/Progress Updates:  ?   ?Pt received in bed with unrated pain, praying and intermittently crying during prayer. Pt engages in this behavior for nearly 20 min, shaking head yes when asked if he wanted to change his clothing. Pt gently guided back to the present and agreeable to pray for 2 more minutes with timer set before starting dressing with AE. Pt able to terminate with min cuing as timer goes off. Gently discussed options for schedule to be blocked from 7-8 as pt "likes to spend time in the morning with God." Also discussed using prayer as a pain management technique but using brief 2 min prayers PRN during therapy sessions that way pt would still be able to work towards goals d/t time limitations. Pt also educated that getting onto floor on knees was not recommended based on his surgeries he had and would benefit from a "prayer chair"--TIS that PT will provide-- to allow for a change in position to improve designation of safe praying position/decrease risk of falling. Pt agreeable to all of these measures ? ?Pt dresses EOB with frequent rest breaks and 1 use of timer for prayer during sup>sit. Pt changes shirt with set up and pants with MIN A and use of reacher to dof/don over legs. Pt requires MAX A for donning CAM boot.  ? ?Pt left at end of session in  bed with exit alarm on, call light in reach and all needs met ? ?Session 2: ? ?Pt received in bed with unable/unwilling to rate pain d/t constant prayer. Prayer rapid disorganized and nonsesical as pt praying outloard repeating phrases such as "take a stand" "dont take that man" "Oj's standing in front of me" and "that son of a B isnt going to get me." Pt intermittently screaming about the "holy spirit" and "hallelujah" with OT attempting ot redirect pt to lower voice volume or to the room itself, hwoever pt remains with eyes closed but lowers voice eventually. Attempted to find out if pt was having visual or auditory halucinations. Pt does not respond. Pt missed 15 min skilled OT. ? ?Pt left at end of session in bed with exit alarm on, call light in reach and all needs met. Updated behavior plan after this incident with PT and RN ? ? ?Therapy Documentation ?Precautions:  ?Precautions ?Precautions: Fall, Back ?Precaution Comments: back precautions for comfort (L2-3 TP fxs) ?Required Braces or Orthoses: Other Brace ?Other Brace: L CAM boot when OOB ?Restrictions ?Weight Bearing Restrictions: Yes ?RLE Weight Bearing: Weight bearing as tolerated ?LLE Weight Bearing: Weight bearing as tolerated ?Other Position/Activity Restrictions: LLE WBAT in CAM boot ? ?Therapy/Group: Individual Therapy ? ?Lowella Dell Rowynn Mcweeney ?05/24/2021, 6:49 AM ?

## 2021-05-24 NOTE — Progress Notes (Deleted)
Speech Language Pathology Daily Session Note ? ?Patient Details  ?Name: Richard Vasquez ?MRN: [id number] ?Date of Birth: [birth year] ? ?Today's Date: 05/24/2021 ?SLP Individual Time: 0800-0900 ?SLP Individual Time Calculation (min): 60 min ? ?Short Term Goals: ?Week 1: SLP Short Term Goal 1 (Week 1): Pt will complete additional cognitive-linguisitc assessment to further determine POC with 100% completion. ?SLP Short Term Goal 2 (Week 1): Pt will utilize external aids to recall functional, daily information with 100% accuracy given Max A. ?SLP Short Term Goal 3 (Week 1): Pt will complete functional money management task r/t counting bills and coins with 80% accuracy given Mod A. ?SLP Short Term Goal 4 (Week 1): Pt will attend to therapeutic and functional tasks for 3-5 minutes with no more than 3 cues to remain engaged. ? ?Skilled Therapeutic Interventions: ?Pt seen this date for skilled ST intervention targeting cognitive-linguistic goals outlined above. Pt encountered awake/alert and lying semi-reclined in bed. Greeted therapist appropriately upon arrival. Agreeable to ST intervention at bedside.  ? ?SLP facilitated today's session by implementing the following skilled interventions within the context of functional tx tasks: spaced retrieval and errorless learning principles, corrective feedback, and skilled education re: safety precautions to include increased fall risk. Per chart review, pt found out of bed on his knees praying without staff present.  ? ?Pt completed functional problem-solving and command following tasks r/t orientation and monthly calendar. Pt correctly filled in the numbers on the calendar with 100% accuracy with Mod I given initial demonstration of task. Followed commands to cross out past days with ~85% accuracy given Min-Mod verbal and visual A; unaware of errors initially and required Mod verbal and visual A, to include comparison, to identify errors made. Total A provided re: use of TV remote  and HOB positioning button on bed given pt would frequently hit the RN button when attempting to elevate HOB. Pt successfully recalled use of HOB positioning button following a 3-minute delay with distraction and utilized independently x 2 to reposition for intake of liquids and breakfast; required Mod verbal and visual A for demonstration of utilize [facility] button by the end of the session. Min-Mod verbal and gesture/visual A for consistent use and navigation of external aids for recall of MD name, RN name, date, and "call, don't fall" sign to recall importance of calling for bathroom + getting out of bed with 100% accuracy. Mod A for date recall. Overall, pt with improved engagement and focused attention to very basic functional tasks; attended for up to 5 minutes with Supervision A.   ? ?Session concluded with pt in bed, bed alarm on , call bell reviewed and within reach, and all immediate needs met. Continue per current ST POC.  ? ?Pain ?7/10 pain in LLE; RN aware. Pt utilizing repositioning and prayer for management. ? ?Therapy/Group: Individual Therapy ? ?Shera Laubach A Zubair Lofton ?05/24/2021, 11:27 AM ?

## 2021-05-24 NOTE — Plan of Care (Addendum)
Behavioral Plan  ? ?[place] Level: V-VI ? ?Behavior to decrease/ eliminate:  ?-Getting OOB on his own ?-Kneeling to the floor to pray or standing on his own to pray ?-Frustration tolerance with staff ?-Physical aggression with staff ?-screaming and yelling ? ?Changes to environment:  ?-Lights on, blinds open during the day; off and closed at night ?-Keep bible in reach ?-TV off at night! ?-have staff not in pt care sit outside pt room at charting station when able ? ?Interventions: ?-Room on alarmed unit ?-Bed alarm on moderate sensitivity  ?-seat belt alarm with tray table over patient in w/c or recliner ?-blocked therapy time 7-8am to allow time for prayer ? ?Recommendations for interactions with patient: ?-Limit prayer during therapy to 2 min with a timer ?-Limited number of people in the room  ?-offer to call girlfriend to help calm pt ?- gentle calm voice  ? ?Attendees:  Apolinar Junes, PT, Mariane Masters, OT, Weston Anna, SLP, Dorthula Nettles, RN ? ?

## 2021-05-24 NOTE — Progress Notes (Signed)
Physical Therapy TBI Note ? ?Patient Details  ?Name: Richard Vasquez ?MRN: [id number] ?Date of Birth: 11-07-1960 ? ?Today's Date: 05/24/2021 ?PT Individual Time: 1325-1350 ?PT Individual Time Calculation (min): 60 min  ? ?Short Term Goals: ?Week 1:  PT Short Term Goal 1 (Week 1): Pt will perform bed mobility without use of bed features and consistent CGA. ?PT Short Term Goal 2 (Week 1): Pt will perform sit<>stand and stand pivot transfers with consistent CGA/ MinA. ?PT Short Term Goal 3 (Week 1): Pt will ambulate at least 30 ft using RW with CGA. ?PT Short Term Goal 4 (Week 1): Pt will initiate gait training. ? ?Skilled Therapeutic Interventions/Progress Updates:  ?   ?Patient yelling with 2 NTs in the room upon PT arrival. NT used verbal deescalating to redirect patient to transfer back to bed. Patient then spontaneously stood and PT stepped in to assist patient for safety. Patient stood >6 min without upper extremity support, CAM boot on L foot, with min-mod A praying and intermittently yelling out during prayer. Patient non-responsive to PT's cues to sit for safety or use of RW. Utilized therapeutic use of self to speak calmly to patient to provide gentle orientation and let him know what PT was doing before initiating it. Brought Lean, NT, in to stand on the other side of the patient and PT and NT assisted patient to sitting and lying with mod-max A +2 while patient continued to pray out loud and not respond to cues, did not react to assist from staff. Patient was scooted up in the bed with total A +3. Provided patient with his bible, glassed, and water in the bed. Placed 4 rails up for patient safety, RN made aware.  ? ?Patient did respond x1 in standing to NT and x1 to PT in the bed with inappropriate one word responses. Patient resting in the bed continuing to pray out loud with intermittent yelling without further impulsive behaviors. Patient in bed at end of session with breaks locked, bed alarm set to mod  setting per behavior plan, and all needs within reach. Updated safety plan to include having CAM boot donned at all times due to patient's impulsivity and having a staff member stationed at the desk outside of his room when not otherwise occupied for increased response time to alarms for patient's safety, RN in agreement.  ? ?Therapy Documentation ?Precautions:  ?Precautions ?Precautions: Fall, Back ?Precaution Comments: back precautions for comfort (L2-3 TP fxs) ?Required Braces or Orthoses: Other Brace ?Other Brace: L CAM boot when OOB ?Restrictions ?Weight Bearing Restrictions: Yes ?RLE Weight Bearing: Weight bearing as tolerated ?LLE Weight Bearing: Weight bearing as tolerated ?Other Position/Activity Restrictions: LLE WBAT in CAM boot ?Agitated Behavior Scale: ?TBI ?Observation Details ?Observation Environment: pt room ?Start of observation period - Date: 05/24/21 ?Start of observation period - Time: 1325 ?End of observation period - Date: 05/24/21 ?End of observation period - Time: 1350 ?Agitated Behavior Scale (DO NOT LEAVE BLANKS) ?Short attention span, easy distractibility, inability to concentrate: Present to an extreme degree ?Impulsive, impatient, low tolerance for pain or frustration: Present to an extreme degree ?Uncooperative, resistant to care, demanding: Present to an extreme degree ?Violent and/or threatening violence toward people or property: Absent ?Explosive and/or unpredictable anger: Present to a moderate degree ?Rocking, rubbing, moaning, or other self-stimulating behavior: Present to a slight degree ?Pulling at tubes, restraints, etc.: Absent ?Wandering from treatment areas: Absent ?Restlessness, pacing, excessive movement: Present to a moderate degree ?Repetitive behaviors, motor, and/or verbal: Present to  an extreme degree ?Rapid, loud, or excessive talking: Present to an extreme degree ?Sudden changes of mood: Present to an extreme degree ?Easily initiated or excessive crying and/or  laughter: Absent ?Self-abusiveness, physical and/or verbal: Absent ?Agitated behavior scale total score: 37 ? ? ? ?Therapy/Group: Individual Therapy ? ?Doreene Burke PT, DPT ? ?05/24/2021, 4:18 PM  ?

## 2021-05-24 NOTE — Progress Notes (Signed)
Physical Therapy TBI Note ? ?Patient Details  ?Name: Richard Vasquez ?MRN: [id number] ?Date of Birth: [birth year] ? ?Today's Date: 05/24/2021 ?PT Individual Time: 1000-1100 ?PT Individual Time Calculation (min): 60 min  ? ?Short Term Goals: ?Week 1:  PT Short Term Goal 1 (Week 1): Pt will perform bed mobility without use of bed features and consistent CGA. ?PT Short Term Goal 2 (Week 1): Pt will perform sit<>stand and stand pivot transfers with consistent CGA/ MinA. ?PT Short Term Goal 3 (Week 1): Pt will ambulate at least 30 ft using RW with CGA. ?PT Short Term Goal 4 (Week 1): Pt will initiate gait training. ? ?Skilled Therapeutic Interventions/Progress Updates:  ?   ?Patient in bed with CAM boot donned upon PT arrival. Patient alert and agreeable to PT session. Patient reported 5-6/10 L lower extremity pain during session, RN made aware. PT provided repositioning, rest breaks, and distraction as pain interventions throughout session.  ? ?Removed CAM boot for bed level exercises. Upon inspection noted increased redness and edema of L lower leg, PA and RN made aware.  ? ?Upon standing with CAM boot donned during session, patient with severe "burning" and "tingling" sensation on his L lower leg and patient was unable to remain standing. Attempted to adjust CAM boot without change. Patient reports this is a new pain/sensation that he had not experienced before, RN made aware and limited standing during session.  ? ?Therapeutic Activity: ?Bed Mobility: Patient performed supine to sit with min A, unable to follow-cues for use of elbows to push up to sitting and ultimately pulled up with PT assist.  ?Transfers: Patient performed sit to/from stand x2 with CGA-min A using RW, unable to remain standing x2 due to new L leg pain, see above. Provided verbal cues for hand placement and forward weight shift. Performed squat pivot with mod A due to poor ability to follow cues for hand placement and technique. ? ?Therapeutic  Exercise: ?Patient performed the following exercises with AAROM with verbal and tactile cues for proper technique. ?-B knee/hip flexion/extension supine 2x10 (R limited hip flexion to 90, L limited knee flexion to 20-30 deg with full extension, hip flexion ~45 deg) ?-B hip abd/add supine 2x10 ?-B quad sets 2x10 ? ?Required increased time and rest breaks due to pain throughout session. Educated on use of pain medication and non-pharmacologic interventions to improve tolerance with therapies, patient in agreement.  ? ?Patient in w/c in the room with the bedside table placed in front of him to reduce impulsivity at end of session with breaks locked, seat belt alarm set, and all needs within reach.  ? ?Therapy Documentation ?Precautions:  ?Precautions ?Precautions: Fall, Back ?Precaution Comments: back precautions for comfort (L2-3 TP fxs) ?Required Braces or Orthoses: Other Brace ?Other Brace: L CAM boot when OOB ?Restrictions ?Weight Bearing Restrictions: Yes ?RLE Weight Bearing: Weight bearing as tolerated ?LLE Weight Bearing: Weight bearing as tolerated ?Other Position/Activity Restrictions: LLE WBAT in CAM boot ?Agitated Behavior Scale: ?TBI ?Observation Details ?Observation Environment: pt room ?Start of observation period - Date: 05/24/21 ?Start of observation period - Time: 1000 ?End of observation period - Date: 05/24/21 ?End of observation period - Time: 1100 ?Agitated Behavior Scale (DO NOT LEAVE BLANKS) ?Short attention span, easy distractibility, inability to concentrate: Present to a moderate degree ?Impulsive, impatient, low tolerance for pain or frustration: Present to a moderate degree ?Uncooperative, resistant to care, demanding: Absent ?Violent and/or threatening violence toward people or property: Absent ?Explosive and/or unpredictable anger: Absent ?Rocking, rubbing, moaning,  or other self-stimulating behavior: Absent ?Pulling at tubes, restraints, etc.: Absent ?Wandering from treatment areas:  Absent ?Restlessness, pacing, excessive movement: Absent ?Repetitive behaviors, motor, and/or verbal: Absent ?Rapid, loud, or excessive talking: Present to an extreme degree ?Sudden changes of mood: Absent ?Easily initiated or excessive crying and/or laughter: Absent ?Self-abusiveness, physical and/or verbal: Absent ?Agitated behavior scale total score: 18 ? ? ? ?Therapy/Group: Individual Therapy ? ?Doreene Burke PT, DPT ? ?05/24/2021, 3:54 PM  ?

## 2021-05-25 MED ORDER — SORBITOL 70 % SOLN
30.0000 mL | Status: AC
  Administered 2021-05-25: 30 mL via ORAL
  Filled 2021-05-25: qty 30

## 2021-05-25 MED ORDER — SENNOSIDES-DOCUSATE SODIUM 8.6-50 MG PO TABS
2.0000 | ORAL_TABLET | Freq: Every day | ORAL | Status: DC
  Administered 2021-05-26 – 2021-05-29 (×4): 2 via ORAL
  Filled 2021-05-25 (×4): qty 2
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
## 2021-05-25 NOTE — Progress Notes (Deleted)
Patient requested that all 4 bedrails be put up whjen in bed. ?
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
## 2021-05-25 NOTE — Progress Notes (Signed)
Physical Therapy TBI Note ? ?Patient Details  ?Name: Richard Vasquez ?MRN: [id number] ?Date of Birth: 06/13/1960 ? ?Today's Date: 05/25/2021 ?PT Individual Time: 9622-2979 ?PT Individual Time Calculation (min): 24 min  ? ?Short Term Goals: ?Week 1:  PT Short Term Goal 1 (Week 1): Pt will perform bed mobility without use of bed features and consistent CGA. ?PT Short Term Goal 2 (Week 1): Pt will perform sit<>stand and stand pivot transfers with consistent CGA/ MinA. ?PT Short Term Goal 3 (Week 1): Pt will ambulate at least 30 ft using RW with CGA. ?PT Short Term Goal 4 (Week 1): Pt will initiate gait training. ? ?Skilled Therapeutic Interventions/Progress Updates:  ? Received pt semi-reclined in bed with girlfriend present at bedside - recreational therapy present during session. Session with emphasis on functional mobility/transfers, attention, dynamic standing balance, and gait training. Pt transferred semi-reclined<>sitting EOB with HOB elevated and use of bedrails with supervision and donned R shoe with supervision (L CAM boot already on). Pt then requested time to meditate prior to walking - sat EOB for 5 minutes with eyes closed. After pt finished meditating, discussed creating "meditation time" in daily therapy schedule to maximize therapy time - pt in agreement and recreational therapist spoke with primary PT/OT/SLP. Sit<>stand with RW and CGA/min A and pt ambulated 65f with RW and CGA - pt ambulates at decreased cadence. Attempted to raise RW for improved fit, but pt politely refused. Returned to room and transferred sit<>supine with supervision with increased time with effort. Pt reported fatigue from ambulating and requesting more meditation time. Concluded session with pt semi-reclined in bed, needs within reach, and bed alarm on. All 4 bedrails up per pt and girlfriend request.  ? ?Therapy Documentation ?Precautions:  ?Precautions ?Precautions: Fall, Back ?Precaution Comments: back precautions for comfort  (L2-3 TP fxs) ?Required Braces or Orthoses: Other Brace ?Other Brace: L CAM boot when OOB ?Restrictions ?Weight Bearing Restrictions: Yes ?RLE Weight Bearing: Weight bearing as tolerated ?LLE Weight Bearing: Weight bearing as tolerated ?Other Position/Activity Restrictions: LLE WBAT in CAM boot ?Agitated Behavior Scale: ?TBI ?Observation Details ?Observation Environment: pt's room/hall ?Start of observation period - Date: 05/25/21 ?Start of observation period - Time: 1030 ?End of observation period - Date: 05/25/21 ?End of observation period - Time: 1100 ?Agitated Behavior Scale (DO NOT LEAVE BLANKS) ?Short attention span, easy distractibility, inability to concentrate: Present to an extreme degree ?Impulsive, impatient, low tolerance for pain or frustration: Present to a slight degree ?Uncooperative, resistant to care, demanding: Present to a slight degree ?Violent and/or threatening violence toward people or property: Absent ?Explosive and/or unpredictable anger: Present to a slight degree ?Rocking, rubbing, moaning, or other self-stimulating behavior: Present to a slight degree ?Pulling at tubes, restraints, etc.: Absent ?Wandering from treatment areas: Absent ?Restlessness, pacing, excessive movement: Present to a moderate degree ?Repetitive behaviors, motor, and/or verbal: Present to an extreme degree ?Rapid, loud, or excessive talking: Present to a slight degree ?Sudden changes of mood: Present to a slight degree ?Easily initiated or excessive crying and/or laughter: Absent ?Self-abusiveness, physical and/or verbal: Absent ?Agitated behavior scale total score: 28 ? ?Therapy/Group: Individual Therapy ?ABlenda Nicely?ABecky SaxPT, DPT  ?05/25/2021, 7:33 AM  ?

## 2021-05-25 NOTE — Progress Notes (Signed)
Pt refused gabapentin this evening, stated he was in no pain. This nurse educated patient but still refused. Patient also stated he took a BM 05/24/21 and did not want to take senkot. Patient educated.  ?

## 2021-05-25 NOTE — Progress Notes (Signed)
Patient requested all 4 bed rails be put up when he is in bed. ?

## 2021-05-25 NOTE — Progress Notes (Signed)
Occupational Therapy TBI Note ? ?Patient Details  ?Name: Richard Vasquez ?MRN: [id number] ?Date of Birth: 1960/02/22 ? ?Today's Date: 05/25/2021 ?OT Individual Time: 7048-8891 ?OT Individual Time Calculation (min): 43 min  ? ? ?Short Term Goals: ?Week 1:  OT Short Term Goal 1 (Week 1): pt will compelte toilet transfer wiht MIN A consistently with LRAD ?OT Short Term Goal 2 (Week 1): Pt will thread BLE into pants to demo improved BLE strength ?OT Short Term Goal 3 (Week 1): Pt will complete 3/3 steps of toileting with MIN A ? ?Skilled Therapeutic Interventions/Progress Updates:  ?   ?Pt received semi-reclined in bed, LLE CAM already donned, gf present, agreeable to therapy. Session focus on activity tolerance, dual tasking, transfer retraining in prep for improved ADL/IADL/func mobility performance + decreased caregiver burden. Reports ongoing LLE pain, but not requesting intervention at this time. Came to sitting EOB with close S to adhere to back precautions.  ? ?Short ambulatory transfer and sit to stand with RW and overall CGA. NT present to take vitals. Total A w/c transport to and from gym. ? ?Pt stood to participate in 3 rounds of corn hole, required max A to keep track of and tally points, but CGA throughout for balance with no UE support. ? ?Stood to Herbalist Letter game on BITS to target standing tolerance and dual tasking, pt required again overall max A to recall alphabetical order in order to hit targets. ? ?Pt very pleasant during today's session and good participation throughout. ? ?Pt left seated in w/c with girlfriend present with safety belt alarm engaged, call bell in reach, and all immediate needs met.  ? ?Therapy Documentation ?Precautions:  ?Precautions ?Precautions: Fall, Back ?Precaution Comments: back precautions for comfort (L2-3 TP fxs) ?Required Braces or Orthoses: Other Brace ?Other Brace: L CAM boot when OOB ?Restrictions ?Weight Bearing Restrictions: Yes ?RLE Weight Bearing:  Weight bearing as tolerated ?LLE Weight Bearing: Weight bearing as tolerated ?Other Position/Activity Restrictions: LLE WBAT in CAM boot ? ?Pain: ?  See session note ?Agitated Behavior Scale: ?TBI ?Observation Details ?Observation Environment: pt room ?Start of observation period - Date: 05/25/21 ?Start of observation period - Time: 72 ?End of observation period - Date: 05/25/21 ?End of observation period - Time: 1352 ?Agitated Behavior Scale (DO NOT LEAVE BLANKS) ?Short attention span, easy distractibility, inability to concentrate: Present to a slight degree ?Impulsive, impatient, low tolerance for pain or frustration: Absent ?Uncooperative, resistant to care, demanding: Absent ?Violent and/or threatening violence toward people or property: Absent ?Explosive and/or unpredictable anger: Absent ?Rocking, rubbing, moaning, or other self-stimulating behavior: Absent ?Pulling at tubes, restraints, etc.: Absent ?Wandering from treatment areas: Absent ?Restlessness, pacing, excessive movement: Present to a moderate degree ?Repetitive behaviors, motor, and/or verbal: Present to a moderate degree ?Rapid, loud, or excessive talking: Absent ?Sudden changes of mood: Absent ?Easily initiated or excessive crying and/or laughter: Absent ?Self-abusiveness, physical and/or verbal: Absent ?Agitated behavior scale total score: 19 ? ?ADL: See Care Tool for more details. ? ? Therapy/Group: Individual Therapy ? ?Volanda Napoleon MS, OTR/L ? ?05/25/2021, 12:53 PM ?

## 2021-05-25 NOTE — Progress Notes (Signed)
Occupational Therapy TBI Note ? ?Patient Details  ?Name: Richard Vasquez ?MRN: [id number] ?Date of Birth: 1960-09-26 ? ?Today's Date: 05/25/2021 ?OT Individual Time: [phone] ?OT Individual Time Calculation (min): 43 min  ? ? ?Short Term Goals: ?Week 1:  OT Short Term Goal 1 (Week 1): pt will compelte toilet transfer wiht MIN A consistently with LRAD ?OT Short Term Goal 2 (Week 1): Pt will thread BLE into pants to demo improved BLE strength ?OT Short Term Goal 3 (Week 1): Pt will complete 3/3 steps of toileting with MIN A ? ?Skilled Therapeutic Interventions/Progress Updates:  ?   ?Pt received in bed with girlfrined present and pain "not too bad this morning"--premedicated  ? ?ADL: ?Pt completes ADL at overall CGA level for mobility with VC for footplacement and RW management, set up for UB ADLs and A for boot only. Pt with poor processing and understanding of precuations requiring MAX cuing for sequencing dressing. Pt is able to use AE with MOD VC for strategy (mostly sock aide). Pt would benefit from continued practice to improve use. MOD cuing for termiating bathing as pt perseverative on washing/drying face and arms. Pt polite with no behaviors this session. ? ?Pt left at end of session in bed with exit alarm on, call light in reach and all needs met ? ? ?Therapy Documentation ?Precautions:  ?Precautions ?Precautions: Fall, Back ?Precaution Comments: back precautions for comfort (L2-3 TP fxs) ?Required Braces or Orthoses: Other Brace ?Other Brace: L CAM boot when OOB ?Restrictions ?Weight Bearing Restrictions: Yes ?RLE Weight Bearing: Weight bearing as tolerated ?LLE Weight Bearing: Weight bearing as tolerated ?Other Position/Activity Restrictions: LLE WBAT in CAM boot ?General: ? ?  ?Agitated Behavior Scale: ?TBI  ?Observation Details ?Observation Environment: pt room ?Start of observation period - Date: 05/25/21 ?Start of observation period - Time: 0900 ?End of observation period - Date: 05/25/21 ?End of  observation period - Time: 0945 ?Agitated Behavior Scale (DO NOT LEAVE BLANKS) ?Short attention span, easy distractibility, inability to concentrate: Present to a slight degree ?Impulsive, impatient, low tolerance for pain or frustration: Absent ?Uncooperative, resistant to care, demanding: Absent ?Violent and/or threatening violence toward people or property: Absent ?Explosive and/or unpredictable anger: Absent ?Rocking, rubbing, moaning, or other self-stimulating behavior: Absent ?Pulling at tubes, restraints, etc.: Absent ?Wandering from treatment areas: Absent ?Restlessness, pacing, excessive movement: Present to a moderate degree ?Repetitive behaviors, motor, and/or verbal: Present to a moderate degree ?Rapid, loud, or excessive talking: Absent ?Sudden changes of mood: Absent ?Easily initiated or excessive crying and/or laughter: Absent ?Self-abusiveness, physical and/or verbal: Absent ?Agitated behavior scale total score: 19 ? ? ? ? ?Therapy/Group: Individual Therapy ? ?Lowella Dell Jerzy Crotteau ?05/25/2021, 12:13 PM ?

## 2021-05-25 NOTE — Progress Notes (Signed)
Speech Language Pathology TBI Note ? ?Patient Details  ?Name: Richard Vasquez ?MRN: [id number] ?Date of Birth: 04-22-1960 ? ?Today's Date: 05/25/2021 ?SLP Individual Time: 1405-1500 ?SLP Individual Time Calculation (min): 55 min ? ?Short Term Goals: ?Week 1: SLP Short Term Goal 1 (Week 1): Pt will complete additional cognitive-linguisitc assessment to further determine POC with 100% completion. ?SLP Short Term Goal 2 (Week 1): Pt will utilize external aids to recall functional, daily information with 100% accuracy given Max A. ?SLP Short Term Goal 3 (Week 1): Pt will complete functional money management task r/t counting bills and coins with 80% accuracy given Mod A. ?SLP Short Term Goal 4 (Week 1): Pt will attend to therapeutic and functional tasks for 3-5 minutes with no more than 3 cues to remain engaged. ? ?Skilled Therapeutic Interventions: Skilled treatment session focused on cognitive goals. Upon arrival, patient was awake in the wheelchair and agreeable to treatment session. SLP facilitated session by providing overall Min verbal cues for recall during a basic money management task with only supervision level verbal cues needed for problem solving. Patient also participated in a basic medication management task with Min verbal cues needed for problem solving and recall. SLP initiated education regarding utilization of a pill box to maximize accuracy with medication administration. Both the patient and his girlfriend were in agreement. Patient demonstrated sustained attention to tasks for ~5 minute intervals with supervision level verbal cues. Throughout session, patient remained calm and cooperative. Patient left upright in wheelchair with alarm on and all needs within reach. Continue with current plan of care.  ?   ? ?Pain ?No/Denies Pain  ? ?Agitated Behavior Scale: ?TBI ?Observation Details ?Observation Environment: CIR ?Start of observation period - Date: 05/25/21 ?Start of observation period - Time:  1405 ?End of observation period - Date: 05/25/21 ?End of observation period - Time: 1500 ?Agitated Behavior Scale (DO NOT LEAVE BLANKS) ?Short attention span, easy distractibility, inability to concentrate: Present to a slight degree ?Impulsive, impatient, low tolerance for pain or frustration: Absent ?Uncooperative, resistant to care, demanding: Absent ?Violent and/or threatening violence toward people or property: Absent ?Explosive and/or unpredictable anger: Absent ?Rocking, rubbing, moaning, or other self-stimulating behavior: Absent ?Pulling at tubes, restraints, etc.: Absent ?Wandering from treatment areas: Absent ?Restlessness, pacing, excessive movement: Absent ?Repetitive behaviors, motor, and/or verbal: Absent ?Rapid, loud, or excessive talking: Absent ?Sudden changes of mood: Absent ?Easily initiated or excessive crying and/or laughter: Absent ?Self-abusiveness, physical and/or verbal: Absent ?Agitated behavior scale total score: 15 ? ?Therapy/Group: Individual Therapy ? ?Richard Vasquez ?05/25/2021, 3:13 PM ?

## 2021-05-26 DIAGNOSIS — S069X0S Unspecified intracranial injury without loss of consciousness, sequela: Secondary | ICD-10-CM
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
## 2021-05-26 NOTE — Progress Notes (Signed)
Occupational Therapy TBI Note ? ?Patient Details  ?Name: Richard Vasquez ?MRN: [id number] ?Date of Birth: [birth year] ? ?Today's Date: 05/26/2021 ?OT Individual Time: 3524-8185 ?OT Individual Time Calculation (min): 71 min  ? ?Today's Date: 05/26/2021 ?OT Individual Time: 9093-1121 ?OT Individual Time Calculation (min): 59 min  ? ?Short Term Goals: ?Week 1:  OT Short Term Goal 1 (Week 1): pt will compelte toilet transfer wiht MIN A consistently with LRAD ?OT Short Term Goal 2 (Week 1): Pt will thread BLE into pants to demo improved BLE strength ?OT Short Term Goal 3 (Week 1): Pt will complete 3/3 steps of toileting with MIN A ? ?Skilled Therapeutic Interventions/Progress Updates:  ?   ?Pt received in bed with unrated pain in pelvis requiring increased time to complete mobility. ? ?ADL: ?Pt completes ADL at overall CGA Level. Skilled interventions include: VC for sequencing and precautions as well as AE use to doff orthosis/pants/socks, and don pants/socks. A to don orthosis. Pt bathes with cuing for termination and no standing in shower. Pt will often verbalize "OK" but contiue to break precuations requiring direct VC. After shower pt dons clothing seated on the edge of TTB ? ?While OT getting clothing from room pt elects to stand impulsively with no CAM boot on. When OT returns 10 sec later DIRECT verbal cues to sit down.  ? ?Continued edu re wearing CAM boot required however d/t impulsivity/memory deficits will need supervision 24/7.  ?Pt shaves seated at sink after amb transfer out of bathroom with S.  ? ?Pt left at end of session in w/c with exit alarm on, call light in reach and all needs met ? ?Session 2: ?Pt received in w./c with no pain  ? ?Therapeutic exercise ?Pt completes 2x10-15 dowel rod therex for BUE shoulder strengthening required for BADLs/functional transfers as follows with demo cuing and 7.5 # dowel rod ? ?Shoulder flex/ext, shoulder press, horizonal ab/adduct ?Circles in B directions ?Elbow flex/ext,  chest press ?Wrist flex/ext ? ?Pt completes 3x1 min beach ball volley in seated position with 5 # dowel rod for dynamic balance, postural control, BUE strengthening and endurance required for BADLs and functional transfers.  ? ?Kinetron reciprocal movement training with LLE in CAM boot 4x 1 min with 1 min rest in between each rounds increasing resistance each round (70, 60, 50, and 40 CM/sec)  ? ?Pt left at end of session in bed with exit alarm on, call light in reach and all needs met ? ? ?Therapy Documentation ?Precautions:  ?Precautions ?Precautions: Fall, Back ?Precaution Comments: back precautions for comfort (L2-3 TP fxs) ?Required Braces or Orthoses: Other Brace ?Other Brace: L CAM boot when OOB ?Restrictions ?Weight Bearing Restrictions: Yes ?RLE Weight Bearing: Weight bearing as tolerated ?LLE Weight Bearing: Weight bearing as tolerated ?Other Position/Activity Restrictions: LLE WBAT in CAM boot ?Agitated Behavior Scale: ?TBI ?  ?Observation Details ?Observation Environment: Pt room ?Start of observation period - Date: 05/26/21 ?Start of observation period - Time: 0945 ?End of observation period - Date: 05/26/21 ?End of observation period - Time: 1100 ?Agitated Behavior Scale (DO NOT LEAVE BLANKS) ?Short attention span, easy distractibility, inability to concentrate: Present to a slight degree ?Impulsive, impatient, low tolerance for pain or frustration: Present to a moderate degree ?Uncooperative, resistant to care, demanding: Absent ?Violent and/or threatening violence toward people or property: Absent ?Explosive and/or unpredictable anger: Absent ?Rocking, rubbing, moaning, or other self-stimulating behavior: Absent ?Pulling at tubes, restraints, etc.: Absent ?Wandering from treatment areas: Absent ?Restlessness, pacing, excessive movement: Present  to a moderate degree ?Repetitive behaviors, motor, and/or verbal: Present to a moderate degree ?Rapid, loud, or excessive talking: Absent ?Sudden changes of  mood: Absent ?Easily initiated or excessive crying and/or laughter: Absent ?Self-abusiveness, physical and/or verbal: Absent ?Agitated behavior scale total score: 21 ? ? ?Therapy/Group: Individual Therapy ? ?[name] ?05/26/2021, 12:23 PM ?

## 2021-05-26 NOTE — Progress Notes (Signed)
Physical Therapy TBI Note ? ?Patient Details  ?Name: Richard Vasquez ?MRN: [id number] ?Date of Birth: 10-17-1960 ? ?Today's Date: 05/26/2021 ?PT Individual Time: 9381-0175 ?PT Individual Time Calculation (min): 40 min  ? ?Short Term Goals: ?Week 1:  PT Short Term Goal 1 (Week 1): Pt will perform bed mobility without use of bed features and consistent CGA. ?PT Short Term Goal 2 (Week 1): Pt will perform sit<>stand and stand pivot transfers with consistent CGA/ MinA. ?PT Short Term Goal 3 (Week 1): Pt will ambulate at least 30 ft using RW with CGA. ?PT Short Term Goal 4 (Week 1): Pt will initiate gait training. ?Week 2:    ? ?Skilled Therapeutic Interventions/Progress Updates:  ?   ? ?Therapy Documentation ?Precautions:  ?Precautions ?Precautions: Fall, Back ?Precaution Comments: back precautions for comfort (L2-3 TP fxs) ?Required Braces or Orthoses: Other Brace ?Other Brace: L CAM boot when OOB ?Restrictions ?Weight Bearing Restrictions: Yes ?RLE Weight Bearing: Weight bearing as tolerated ?LLE Weight Bearing: Weight bearing as tolerated ?Other Position/Activity Restrictions: LLE WBAT in CAM boot ?General: ?PT Amount of Missed Time (min): 20 Minutes ?PT Missed Treatment Reason: Increased agitation;Patient fatigue ? ?Pain: ?Rates as 6/10LLE, treatment to tolerance, rest breaks as needed. ?Pt initially oob in wc. ?Awake, alert, agreeable to session. ? ?Wc propulsion x 143f w/max cues for safe cadence, pushes impulsively/much too quickly/poor safety awareness.  Slows briefly when cued then resumes rapid pace. ? ?Sit to stand from wc w/min assist, additional time due to pain w/transition. ?Gait 362fw/RW w/min assist, step to gait pattern, cam boot, downward gaze, antalgic gait.  Turn//sit to armless chair w/min assist, use of stable table to L to assist w/lowering. ? ?Pt appeared very restless shifting in chair.  When asked if in pain denies.  When asked if he would feel better in the wc he stated he would.  Therapist  set up wc for transfer from chair. ?Pt unable to stand from chair to RW, performed ?Lateral scoot chair to wc w/mod assist, max cues for safety. ? ?Pt stated "I have had enough therapy today, I am worn out"   ? ?Propelled wc to room as described above. ? ?Seated in wc, pt performed hip IR/ER, LAQs (limited flexion L), hip flexion (limited ROM R) contines to appear restless but not aggressive. States again that he has "had enough therapy for the day"  "worn out".  When therapist offered to assist him to bed for break, pt  Requested to remain oob in wc, girlfriend in room. ? ?Pt left oob in wc w/alarm belt set and needs in reach.  Pt requested ensure, nurse cleared pt for this tand therapist provided.  ? ? ?  ?Agitated Behavior Scale: ?TBI ?Observation Details ?Observation Environment: CIR ?Start of observation period - Date: 05/26/21 ?Start of observation period - Time: 1455 ?End of observation period - Date: 05/26/21 ?End of observation period - Time: 1525 ?Agitated Behavior Scale (DO NOT LEAVE BLANKS) ?Short attention span, easy distractibility, inability to concentrate: Present to a slight degree ?Impulsive, impatient, low tolerance for pain or frustration: Present to a moderate degree ?Uncooperative, resistant to care, demanding: Absent ?Violent and/or threatening violence toward people or property: Absent ?Explosive and/or unpredictable anger: Absent ?Rocking, rubbing, moaning, or other self-stimulating behavior: Absent ?Pulling at tubes, restraints, etc.: Absent ?Wandering from treatment areas: Absent ?Restlessness, pacing, excessive movement: Present to a moderate degree ?Repetitive behaviors, motor, and/or verbal: Present to a slight degree ?Rapid, loud, or excessive talking: Absent ?Sudden changes of  mood: Absent ?Easily initiated or excessive crying and/or laughter: Absent ?Self-abusiveness, physical and/or verbal: Absent ?Agitated behavior scale total score: 20 ? ? ? ?Therapy/Group: Individual  Therapy ?Callie Fielding, PT ? ? ?Jerrilyn Cairo ?05/26/2021, 3:36 PM  ?
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
## 2021-05-28 MED ORDER — DICLOFENAC SODIUM 1 % EX GEL
2.0000 g | Freq: Four times a day (QID) | CUTANEOUS | Status: DC
  Administered 2021-05-28 – 2021-05-30 (×6): 2 g via TOPICAL
  Filled 2021-05-28 (×2): qty 100

## 2021-05-28 NOTE — Progress Notes (Signed)
Occupational Therapy Session Note ? ?Patient Details  ?Name: Richard Vasquez ?MRN: [id number] ?Date of Birth: 09-12-1960 ? ?Today's Date: 05/28/2021 ?OT Individual Time: 6948-5462 ?OT Individual Time Calculation (min): 60 min  ? ? ?Short Term Goals: ?Week 1:  OT Short Term Goal 1 (Week 1): pt will compelte toilet transfer wiht MIN A consistently with LRAD ?OT Short Term Goal 2 (Week 1): Pt will thread BLE into pants to demo improved BLE strength ?OT Short Term Goal 3 (Week 1): Pt will complete 3/3 steps of toileting with MIN A ? ? ?Skilled Therapeutic Interventions/Progress Updates:  ?  Initially pt eating breakfast, therefore therapist returned 15 minutes later per pt request so he could finish meal.  Pt sitting up in w/c, LLE CAM already donned, girlfriend present, agreeable to therapy. Session focus on activity tolerance, dual tasking, transfer retraining in prep for improved ADLs and functional mobility.  Reports ongoing LLE pain, but not requesting intervention at this time. Total A w/c transport to and from gym. ?  ?Pt stood to participate in high table pvc pipe assembling using template.  Pt required frequent cues to slow pacing to allow for increased attention to errors and self monitoring. Min question cues needed for problem solving when error made.  Pt also required CGA throughout to maintain static standing and sit<>stand from/to w/c.  Therapist provided cues to pt when noting right shoe lace untied.  Pt impulsively sat needing CGA and bent over at hip despite OT providing max multimodal cues to attempt to reduce bending and utilize figure 4 if able.  After pt tied shoe.  Pt transported back to room and therapist provided elastic shoelace to right shoe to prevent pt needing excessive bending.  Cold pack applied to left lower leg elevated on foot rest.  Direct hand off to SPT. ? ?Therapy Documentation ?Precautions:  ?Precautions ?Precautions: Fall, Back ?Precaution Comments: back precautions for comfort (L2-3  TP fxs) ?Required Braces or Orthoses: Other Brace ?Other Brace: L CAM boot when OOB ?Restrictions ?Weight Bearing Restrictions: Yes ?RLE Weight Bearing: Weight bearing as tolerated ?LLE Weight Bearing: Weight bearing as tolerated ?Other Position/Activity Restrictions: LLE WBAT in CAM boot ? ? ? ?Therapy/Group: Individual Therapy ? ?Caryl Asp Kayslee Furey ?05/28/2021, 3:01 PM ?

## 2021-05-28 NOTE — Progress Notes (Signed)
Physical Therapy TBI Note ? ?Patient Details  ?Name: Richard Vasquez ?MRN: [id number] ?Date of Birth: 12/27/1960 ? ?Today's Date: 05/28/2021 ?PT Individual Time: 0810-0900 ?PT Individual Time Calculation (min): 50 min  ? ?Short Term Goals: ?Week 1:  PT Short Term Goal 1 (Week 1): Pt will perform bed mobility without use of bed features and consistent CGA. ?PT Short Term Goal 2 (Week 1): Pt will perform sit<>stand and stand pivot transfers with consistent CGA/ MinA. ?PT Short Term Goal 3 (Week 1): Pt will ambulate at least 30 ft using RW with CGA. ?PT Short Term Goal 4 (Week 1): Pt will initiate gait training. ? ?Skilled Therapeutic Interventions/Progress Updates:  ?   ?Patient sitting EOB with his girlfriend meditating upon PT arrival. Patient politely requested to have a few more minutes to meditate before starting PT session. Reports meditation improves his pain tolerance during therapy. Patient missed 10 min of skilled PT due to meditation/pt request, RN made aware. Will attempt to make-up missed time as able.   ?Upon PT return, patient sitting EOB with his girlfriend in the room. Patient alert and agreeable to PT session. Patient reported 3-6/10 L lower extremity and sacral/pelvic pain during session, RN made aware. PT provided repositioning, rest breaks, and distraction as pain interventions throughout session.  ? ?Patient's girlfriend left at beginning of session. PT donned CAM boot with total A prior to mobility. Educated patient on importance of patient learning how to don the boot for increased independence at d/c, patient stating, "I will have help." ? ?Therapeutic Activity: ?Bed Mobility: Patient performed supine to/from sit with supervision on the ADL bed. Provided cues for log roll technique for improved back pain with mobility. Patient did not follow cues for use of elbows to push up to sitting and used the headboard with increased time and effort.  ?Transfers: Patient performed stand pivot bed>w/c and sit  to/from stand from w/c, ADL bed, and ADL recliner with close supervision using RW. Provided verbal cues for hand placement and reaching back to sit. ?Patient performed an ambulatory transfer to void at the toilet in the ADL apartment. Voided in standing with supervision for balance. Independent with peri-care and lower body clothing management.  ? ?Gait Training:  ?Patient ambulated >25 feet x2 over level tile and carpet to simulate household ambulation with close supervision using RW. Ambulated with step-through gait pattern with antalgic gait on R, increased trunk and hip flexion throughout, and decreased safety awareness with RW. Provided verbal cues for erect posture and safe proximity to RW. ?Patient ascended/descended 4x6" steps using L hand rail with mod-min A with 1 arm around therapist's shoulders to off-weight lower extremities without a second rail, simulating home set-up. Performed step-to gait pattern leading with R while ascending and L while descending. Provided demonstration and cues for technique and sequencing.  ? ?Wheelchair Mobility:  ?Patient propelled wheelchair >200 feet x2 with supervision for safety. Provided verbal cues for safe propulsion speed and control. Demonstrated and provided max cues for patient to manage leg rest throughout session, no assist required on last trial, only cues. Patient utilized breaks with cues each time to lock the breaks and only x1 to unlock the breaks.  ? ?Patient in w/c in the room at end of session with breaks locked, seat belt alarm set, and all needs within reach.  ? ?Therapy Documentation ?Precautions:  ?Precautions ?Precautions: Fall, Back ?Precaution Comments: back precautions for comfort (L2-3 TP fxs) ?Required Braces or Orthoses: Other Brace ?Other Brace: L CAM  boot when OOB ?Restrictions ?Weight Bearing Restrictions: Yes ?RLE Weight Bearing: Weight bearing as tolerated ?LLE Weight Bearing: Weight bearing as tolerated ?Other Position/Activity  Restrictions: LLE WBAT in CAM boot ?General: ?PT Amount of Missed Time (min): 10 Minutes ?PT Missed Treatment Reason: Other (Comment);Pain (patient meditating for pain management) ?Agitated Behavior Scale: ?TBI ?Observation Details ?Observation Environment: [facility] ?Start of observation period - Date: 05/28/21 ?Start of observation period - Time: 1045 ?End of observation period - Date: 05/28/21 ?End of observation period - Time: 1125 ?Agitated Behavior Scale (DO NOT LEAVE BLANKS) ?Short attention span, easy distractibility, inability to concentrate: Present to a slight degree ?Impulsive, impatient, low tolerance for pain or frustration: Present to a slight degree ?Uncooperative, resistant to care, demanding: Present to a slight degree ?Violent and/or threatening violence toward people or property: Absent ?Explosive and/or unpredictable anger: Absent ?Rocking, rubbing, moaning, or other self-stimulating behavior: Absent ?Pulling at tubes, restraints, etc.: Absent ?Wandering from treatment areas: Absent ?Restlessness, pacing, excessive movement: Absent ?Repetitive behaviors, motor, and/or verbal: Present to a slight degree ?Rapid, loud, or excessive talking: Absent ?Sudden changes of mood: Absent ?Easily initiated or excessive crying and/or laughter: Absent ?Self-abusiveness, physical and/or verbal: Absent ?Agitated behavior scale total score: 18 ? ? ? ?Therapy/Group: Individual Therapy ? ?Doreene Burke PT, DPT ? ?05/28/2021, 6:48 PM  ?

## 2021-05-28 NOTE — Progress Notes (Signed)
Speech Language Pathology TBI Note ? ?Patient Details  ?Name: Richard Vasquez ?MRN: [id number] ?Date of Birth: 1961/01/15 ? ?Today's Date: 05/28/2021 ?SLP Individual Time: 3704-8889 ?SLP Individual Time Calculation (min): 40 min ? ?Short Term Goals: ?Week 1: SLP Short Term Goal 1 (Week 1): Pt will complete additional cognitive-linguisitc assessment to further determine POC with 100% completion. ?SLP Short Term Goal 2 (Week 1): Pt will utilize external aids to recall functional, daily information with 100% accuracy given Max A. ?SLP Short Term Goal 3 (Week 1): Pt will complete functional money management task r/t counting bills and coins with 80% accuracy given Mod A. ?SLP Short Term Goal 4 (Week 1): Pt will attend to therapeutic and functional tasks for 3-5 minutes with no more than 3 cues to remain engaged. ? ?Skilled Therapeutic Interventions: Skilled treatment session focused on cognitive goals. Upon arrival, patient was awake in the wheelchair and appeared restless in the wheelchair due to pain but declined getting back into bed. Patient given ice and required overall Mod A verbal cues for attention throughout session with mild impulsivity noted throughout tasks. SLP facilitated session by providing Mod verbal cues for recall of his current medications and their functions and Min verbal cues for problem solving while organizing a QD pill box. Patient transferred back to bed at end of session for comfort and left in bed with alarm on and all needs within reach. Continue with current plan of care.  ?   ? ?Pain ?Intermittent pain in LLE, patient premedicated and utilized ice and repositioning  ? ?Agitated Behavior Scale: ?TBI ?Observation Details ?Observation Environment: [facility] ?Start of observation period - Date: 05/28/21 ?Start of observation period - Time: 1045 ?End of observation period - Date: 05/28/21 ?End of observation period - Time: 1125 ?Agitated Behavior Scale (DO NOT LEAVE BLANKS) ?Short attention span, easy  distractibility, inability to concentrate: Present to a slight degree ?Impulsive, impatient, low tolerance for pain or frustration: Present to a slight degree ?Uncooperative, resistant to care, demanding: Absent ?Violent and/or threatening violence toward people or property: Absent ?Explosive and/or unpredictable anger: Absent ?Rocking, rubbing, moaning, or other self-stimulating behavior: Absent ?Pulling at tubes, restraints, etc.: Absent ?Wandering from treatment areas: Absent ?Restlessness, pacing, excessive movement: Present to a moderate degree ?Repetitive behaviors, motor, and/or verbal: Present to a slight degree ?Rapid, loud, or excessive talking: Absent ?Sudden changes of mood: Absent ?Easily initiated or excessive crying and/or laughter: Absent ?Self-abusiveness, physical and/or verbal: Absent ?Agitated behavior scale total score: 19 ? ?Therapy/Group: Individual Therapy ? ?Keniya Schlotterbeck ?05/28/2021, 12:06 PM ?
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
## 2021-05-29 ENCOUNTER — Other Ambulatory Visit (HOSPITAL_COMMUNITY): Payer: Self-pay

## 2021-05-29 LAB — CBC
HCT: 26.9 % — ABNORMAL LOW (ref 39.0–52.0)
Hemoglobin: 8.9 g/dL — ABNORMAL LOW (ref 13.0–17.0)
MCH: 32 pg (ref 26.0–34.0)
MCHC: 33.1 g/dL (ref 30.0–36.0)
MCV: 96.8 fL (ref 80.0–100.0)
Platelets: 407 10*3/uL — ABNORMAL HIGH (ref 150–400)
RBC: 2.78 MIL/uL — ABNORMAL LOW (ref 4.22–5.81)
RDW: 14.4 % (ref 11.5–15.5)
WBC: 9.7 10*3/uL (ref 4.0–10.5)
nRBC: 0 % (ref 0.0–0.2)

## 2021-05-29 MED ORDER — OXYCODONE HCL 10 MG PO TABS
10.0000 mg | ORAL_TABLET | ORAL | 0 refills | Status: DC | PRN
  Filled 2021-05-29: qty 30, 5d supply, fill #0

## 2021-05-29 MED ORDER — ALBUTEROL SULFATE HFA 108 (90 BASE) MCG/ACT IN AERS
1.0000 | INHALATION_SPRAY | Freq: Four times a day (QID) | RESPIRATORY_TRACT | 0 refills | Status: DC | PRN
  Filled 2021-05-29: qty 18, 25d supply, fill #0

## 2021-05-29 MED ORDER — POLYETHYLENE GLYCOL 3350 17 G PO PACK: 17.0000 g | PACK | Freq: Every day | ORAL | 0 refills | Status: DC

## 2021-05-29 MED ORDER — HYDROCHLOROTHIAZIDE 12.5 MG PO TABS
12.5000 mg | ORAL_TABLET | Freq: Every day | ORAL | 0 refills | Status: DC
  Filled 2021-05-29: qty 30, 30d supply, fill #0

## 2021-05-29 MED ORDER — GABAPENTIN 100 MG PO CAPS
100.0000 mg | ORAL_CAPSULE | Freq: Three times a day (TID) | ORAL | 0 refills | Status: DC
  Filled 2021-05-29: qty 90, 30d supply, fill #0

## 2021-05-29 MED ORDER — DICLOFENAC SODIUM 1 % EX GEL
2.0000 g | Freq: Four times a day (QID) | CUTANEOUS | 0 refills | Status: DC
  Filled 2021-05-29: qty 100, 12d supply, fill #0

## 2021-05-29 MED ORDER — PANTOPRAZOLE SODIUM 40 MG PO TBEC
40.0000 mg | DELAYED_RELEASE_TABLET | Freq: Every day | ORAL | 0 refills | Status: DC
  Filled 2021-05-29: qty 30, 30d supply, fill #0

## 2021-05-29 MED ORDER — FERROUS SULFATE 325 (65 FE) MG PO TABS
325.0000 mg | ORAL_TABLET | Freq: Two times a day (BID) | ORAL | 0 refills | Status: DC
  Filled 2021-05-29: qty 60, 30d supply, fill #0

## 2021-05-29 MED ORDER — TRAZODONE HCL 100 MG PO TABS
100.0000 mg | ORAL_TABLET | Freq: Every day | ORAL | 0 refills | Status: DC
  Filled 2021-05-29: qty 30, 30d supply, fill #0

## 2021-05-29 MED ORDER — ASPIRIN 325 MG PO TBEC
DELAYED_RELEASE_TABLET | ORAL | 3 refills | Status: DC
  Filled 2021-05-29: qty 60, 30d supply, fill #0

## 2021-05-29 MED ORDER — VITAMIN D (ERGOCALCIFEROL) 1.25 MG (50000 UNIT) PO CAPS
50000.0000 [IU] | ORAL_CAPSULE | ORAL | 0 refills | Status: DC
  Filled 2021-05-29: qty 4, 28d supply, fill #0

## 2021-05-29 MED ORDER — OLANZAPINE 10 MG PO TABS
5.0000 mg | ORAL_TABLET | Freq: Two times a day (BID) | ORAL | 0 refills | Status: DC
  Filled 2021-05-29: qty 30, 30d supply, fill #0

## 2021-05-29 MED ORDER — ROSUVASTATIN CALCIUM 10 MG PO TABS
10.0000 mg | ORAL_TABLET | Freq: Every day | ORAL | 0 refills | Status: DC
Start: 2021-05-30 — End: 2021-10-31
  Filled 2021-05-29: qty 30, 30d supply, fill #0

## 2021-05-29 MED ORDER — METHOCARBAMOL 750 MG PO TABS
750.0000 mg | ORAL_TABLET | Freq: Four times a day (QID) | ORAL | 0 refills | Status: DC
  Filled 2021-05-29: qty 120, 30d supply, fill #0

## 2021-05-29 MED ORDER — ACETAMINOPHEN 325 MG PO TABS: 325.0000 mg | ORAL_TABLET | ORAL | Status: DC | PRN

## 2021-05-29 MED ORDER — SPIRIVA RESPIMAT 2.5 MCG/ACT IN AERS
2.0000 | INHALATION_SPRAY | Freq: Every day | RESPIRATORY_TRACT | 2 refills | Status: DC
  Filled 2021-05-29: qty 4, 30d supply, fill #0

## 2021-05-29 NOTE — Progress Notes (Signed)
Occupational Therapy Discharge Summary ? ?Patient Details  ?Name: BRAXSTON QUINTER ?MRN: [id number] ?Date of Birth: 1960-09-11 ? ?Today's Date: 05/29/2021 ?OT Individual Time: 0800-0900 ?OT Individual Time Calculation (min): 60 min  ? ?Pt received in bed with 3-4 out of 10 pain pelvis. Rest provided for pain relief ? ?ADL: ?Pt completes ADL at overall SUPERVISION! Level. Skilled interventions include: edu re sequence of donning R CAM boot, reinforcement of precautions, increased time for pt to process cues, decreased cuing to sequence bathing and pt able to terminate bathing tasks appropriately today. Pt with bathing at shower level, toileting with cuing to walk walker over toilet for continent urine voice and seated bowel void with black-ish stool present. RN alerted ? ?Provided handout for family education since girlfriend/pt will be directing care of physical assist and girlfriend will be supervising pt with BADLs for cognition. Girlfriend has been present for ADLs and seen pt compelte/feels comfortable with DC level ?General mobility- they should always use their RW. They may benefit from a walker tray to transport items from room to room if walking with a walker is recommended by physical therapy. The walker should be kept within reach so they can pull it close to get up and keep with them to back up to any surface they want to sit on. When getting up, they should push up from the surface they are getting up from and reach back when sitting to a new surface, no plopping.  ?You are their ?shadow.? Especially in the beginning. You, as the helper should be in reach of the patient when mobilizing. You should be either beside or behind them so if they lose their balance you can assist by helping correct at the hips. This is likely closer than you are used to being- be in their personal space. Use a gait belt if that makes you feel more comfortable ?If you are attempting to get up/transfer and it is not going well, reset.  Have them sit back down. Make sure they are close to the edge of the seat, feet are underneath them at hips distance, and they are leaning forward to stand up. ?Grayland Ormond must have his boot on to stand ?Bathing- they should sit to bathe on a tub transfer bench or wash up at the EOB, should be seated especially for washing legs/feet. Sitting will save energy and increase safety. To wash his bottom he should lean to the side if he does not have his boot on  ?For tub shower with Tub bench: use walker to get to the edge of the tub bench, back up to the edge, reach back prior to sitting down. Turn to swing legs into tub and scoot across. Reverse to exit the tub. Make sure both legs are out of the tub prior to standing to exit the bathroom ?Or wash up in his room with a basin ?Dressing- all should be done from a SEATED level, especially to put underwear and pants over feet.  ?Toileting- the RW can be walked right over the toilet for standing urination if applicable. If seated toileting is more appropriate, have them walk up to the toilet and keep walker with them as they turn to sit to toilet or BSC. Before they stand to pull up pants past hips they should pull pants/underwear up past their knees to decrease the need to bend forward to the floor. Sometimes this makes people dizzy ?if incontinence/bathroom accidents are an issue attempt to toilet every 2-3 hours to improve success  with toileting and decrease accidents.  ?Energy conservation principles- ?Prioritize what needs to be done and what can be moved to another day ?Plan out their days, weeks, months to spread out taxing (physical or cognitively tiring) activities to not put too much at one time ?Pace activities- rest before feeling tired and have designated places to rest if they feel tired and need to take a brake ?Position for success: sit when able to conserve 25% more energy than standing  ? ? ?Pt left at end of session in w/c with exit alarm on, call light in  reach and all needs met ? ? ?Patient has met 9 of 9 long term goals due to improved activity tolerance, improved balance, postural control, ability to compensate for deficits, improved awareness, and improved coordination.  Patient to discharge at overall Supervision level.  Patient's care partner is independent to provide the necessary cognitive assistance at discharge.   ? ?Reasons goals not met: n/a ? ?Recommendation:  ?Patient will benefit from ongoing skilled OT services in outpatient setting to continue to advance functional skills in the area of BADL and Reduce care partner burden. ? ?Equipment: ?No equipment provided ? ?Reasons for discharge: treatment goals met and discharge from hospital ? ?Patient/family agrees with progress made and goals achieved: Yes ? ?OT Discharge ?Precautions/Restrictions  ?Precautions ?Precautions: Fall;Back ?Precaution Comments: back precautions for comfort (L2-3 TP fxs) ?Required Braces or Orthoses: Other Brace ?Other Brace: L CAM boot when OOB ?Restrictions ?Weight Bearing Restrictions: Yes ?RLE Weight Bearing: Weight bearing as tolerated ?LLE Weight Bearing: Weight bearing as tolerated ?Other Position/Activity Restrictions: LLE WBAT in CAM boot ?General ?  ?Vital Signs ?  ?Pain ?  ?ADL ?ADL ?Eating: Supervision/safety ?Grooming: Supervision/safety ?Upper Body Bathing: Supervision/safety ?Where Assessed-Upper Body Bathing: Shower ?Lower Body Bathing: Supervision/safety ?Where Assessed-Lower Body Bathing: Shower ?Upper Body Dressing: Supervision/safety ?Where Assessed-Upper Body Dressing: Edge of bed ?Lower Body Dressing: Supervision/safety ?Where Assessed-Lower Body Dressing: Edge of bed ?Toileting: Supervision/safety ?Toilet Transfer: Close supervision ?Toilet Transfer Method: Ambulating ?Vision ?Baseline Vision/History: 0 No visual deficits ?Patient Visual Report: No change from baseline ?Vision Assessment?: Yes ?Ocular Range of Motion: Within Functional Limits ?Alignment/Gaze  Preference: Within Defined Limits ?Tracking/Visual Pursuits: Able to track stimulus in all quads without difficulty ?Saccades: Within functional limits ?Convergence: Within functional limits ?Perception  ?Perception: Within Functional Limits ?Praxis ?Praxis: Intact ?Cognition ?Cognition ?Overall Cognitive Status: Impaired/Different from baseline ?Arousal/Alertness: Awake/alert ?Orientation Level: Person;Situation;Place ?Person: Oriented ?Place: Oriented ?Situation: Oriented ?Memory: Impaired ?Memory Impairment: Retrieval deficit;Decreased short term memory;Storage deficit;Decreased recall of new information ?Reasoning: Impaired ?Organizing: Impaired ?Behaviors: Restless;Impulsive ?Safety/Judgment: Impaired ?Brief Interview for Mental Status (BIMS) ?Repetition of Three Words (First Attempt): 3 ?Temporal Orientation: Year: Correct ?Temporal Orientation: Month: Accurate within 5 days ?Temporal Orientation: Day: Correct ?Recall: "Sock": No, could not recall ?Recall: "Blue": Yes, no cue required ?Recall: "Bed": No, could not recall ?BIMS Summary Score: 11 ?Sensation ?Sensation ?Light Touch: Appears Intact ?Coordination ?Gross Motor Movements are Fluid and Coordinated: Yes ?Finger Nose Finger Test: [facility] ?Heel Shin Test: unable to follow verbal instructions/tc for R vs L, Bil requires extensive demosntration and tactile guidance ?Motor  ?Motor ?Motor: Abnormal postural alignment and control ?Mobility  ?Transfers ?Sit to Stand: Supervision/Verbal cueing ?Stand to Sit: Supervision/Verbal cueing  ?Trunk/Postural Assessment  ?Cervical Assessment ?Cervical Assessment: Within Functional Limits ?Thoracic Assessment ?Thoracic Assessment: Within Functional Limits ?Lumbar Assessment ?Lumbar Assessment:  (pelvic fx) ?Postural Control ?Postural Control: Deficits on evaluation  ?Balance ?Static Sitting Balance ?Static Sitting - Level of Assistance: 5: Stand by  assistance ?Dynamic Sitting Balance ?Dynamic Sitting - Level of Assistance:  5: Stand by assistance ?Static Standing Balance ?Static Standing - Level of Assistance: 5: Stand by assistance ?Dynamic Standing Balance ?Dynamic Standing - Level of Assistance: 5: Stand by assistance ?E

## 2021-05-29 NOTE — Progress Notes (Signed)
Speech Language Pathology Discharge Summary ? ?Patient Details  ?Name: Richard Vasquez ?MRN: [id number] ?Date of Birth: 03/21/1960 ? ?Today's Date: 05/29/2021 ?SLP Individual Time: 4835-0757 ?SLP Individual Time Calculation (min): 40 min ? ? ?Skilled Therapeutic Interventions:   Skilled treatment session focused on completion of patient and caregiver education with the patient and his girlfriend. SLP facilitated session by providing education regarding patient's current cognitive functioning and strategies to utilize at home to maximize attention, problem solving, recall and overall safety. SLP also provided education regarding how to live a healthy brain lifestyle. Both the patient and his girlfriend verbalized understanding of all education and handouts were given to reinforce information. Patient left upright in wheelchair with alarm on and all needs within reach.  ? ?Patient has met 3 of 3 long term goals.  Patient to discharge at overall Min A level.  ? ?Reasons goals not met: N/A  ? ?Clinical Impression/Discharge Summary: Patient has made functional gains and has met 3 of 3 LTGs this admission. Currently, patient demonstrates behaviors consistent with a [place]o Level VIII and requires overall Min A multimodal cues to complete and functional familiar tasks safely in regards to sustained attention, basic problem solving, recall of basic information with use of strategies and emergent awareness. Patient and caregiver education is complete and patient will discharge home with 24 hour supervision from friends. Patient would benefit from f/u SLP services to maximize his cognitive functioning and overall functional independence in order to reduce caregiver burden. ? ?Care Partner:  ?Caregiver Able to Provide Assistance: Yes  ?Type of Caregiver Assistance: Cognitive;Physical ? ?Recommendation:  ?24 hour supervision/assistance;Outpatient SLP  ?Rationale for SLP Follow Up: Reduce caregiver burden;Maximize cognitive function  and independence  ? ?Equipment: N/A  ? ?Reasons for discharge: Discharged from hospital;Treatment goals met  ? ?Patient/Family Agrees with Progress Made and Goals Achieved: Yes  ? ? ?Kerrie Latour ?05/29/2021, 11:14 AM ? ?

## 2021-05-29 NOTE — Progress Notes (Signed)
Physical Therapy Discharge Summary ? ?Patient Details  ?Name: Richard Vasquez ?MRN: [id number] ?Date of Birth: 1960-06-24 ? ?Today's Date: 05/29/2021 ?PT Individual Time: 1700-1749 and [id number] ?PT Individual Time Calculation (min): 40 min and 60 min  ? ? ?Patient has met 10 of 10 long term goals due to improved activity tolerance, improved balance, improved postural control, increased strength, increased range of motion, decreased pain, ability to compensate for deficits, improved attention, improved awareness, and improved coordination.  Patient to discharge at an ambulatory level Supervision using a RW.   Patient's care partner is independent to provide the necessary cognitive assistance at discharge. ? ?Reasons goals not met: All PT goals met at this time. ? ?Recommendation:  ?Patient will benefit from ongoing skilled PT services in outpatient setting to continue to advance safe functional mobility, address ongoing impairments in balance, strength/ROM, functional mobility, activity tolerance, gait and stair training, safety awareness, community integration, patient/caregiver education, and minimize fall risk. ? ?Equipment: ?Recommending: RW and 16"x16" w/c ? ?Reasons for discharge: treatment goals met ? ?Patient/family agrees with progress made and goals achieved: Yes ? ?Skilled Therapeutic Intervention: ?Session 1: ?Patient in w/c in the room with his girlfriend, Kieth Brightly, at bedside upon PT arrival. Patient alert and agreeable to PT session. Patient reported 3-4/10 L lower extremity and pelvic pain during session, RN made aware. PT provided repositioning, rest breaks, and distraction as pain interventions throughout session.  ? ?Therapeutic Activity: ?Bed Mobility: Patient performed sit to supine independently in a flat bed with increased time for lower extremity management.  ?Transfers: Patient performed stand pivot w/c>bed with distant supervision. Utilized teach back method for set-up and transfer technique with  x1 cue needed for w/c set-up. ? ?Therapeutic Exercise: ?Patient performed the following exercises from HEP handout provided during session with verbal and tactile cues for proper technique. ?Access Code: [email or web address] ?- Supine Ankle Pumps  - 1 x daily - 7 x weekly - 3 sets - 15 reps ?- Supine Isometric Hip Adduction with Pillow at Knees  - 1 x daily - 7 x weekly - 3 sets - 15 reps ?- Supine Gluteal Sets  - 1 x daily - 7 x weekly - 3 sets - 15 reps ?- Supine Heel Slides  - 1 x daily - 7 x weekly - 3 sets - 15 reps ?- Supine Hip Abduction  - 1 x daily - 7 x weekly - 3 sets - 15 reps ?- Supine Active Straight Leg Raise  - 1 x daily - 7 x weekly - 3 sets - 15 reps ?- Supine Short Arc Quad  - 1 x daily - 7 x weekly - 3 sets - 15 reps ?- Seated Long Arc Quad  - 1 x daily - 7 x weekly - 3 sets - 15 reps ?- Seated March  - 1 x daily - 7 x weekly - 3 sets - 15 reps ? ?Patient in bed with Kieth Brightly at bedside at end of session with breaks locked, bed alarm set, and all needs within reach. 2 ice packs provided for lower extremity pain management at end of session.  ? ?Session 2: ?Patient in bed with Kieth Brightly at bedside upon PT arrival. Patient alert and agreeable to PT session. Patient reported 3-4/10 lower extremity and pelvic pain during session, RN made aware. PT provided repositioning, rest breaks, and distraction as pain interventions throughout session.  ? ?Patient's girlfriend, Kieth Brightly, present for family education and hands on training throughout session. Took videos of mobility for patient's house  mates to follow at home for supervision assistance and assistance on stairs. Educated on fall risk/prevention, home modifications to prevent falls, and activation of emergency services in the event of a fall during session.  ? ?Therapeutic Activity: ?Bed Mobility: Patient performed rolling R/L and supine to/from sit with independently with increased time/effort for lower extremity management in a flat bed without use of bed rails.  Patient donned/doffed L CAM boot without cues while sitting EOB. ?Transfers: Patient performed sit to/from stand x4 with supervision. Provided min verbal cues for w/c set-up and safety awareness. ?Patient performed a simulated sedan height car transfer with supervision using RW. Provided cues for safe technique and leaning and moving the chair back to increase leg room and reduce hip flexion angle for increased tolerance of activity. ? ?Gait Training:  ?Patient ambulated >250 feet using RW with supervision. Ambulated as described below. Provided verbal cues for erect posture, looking ahead, and equal step length. ?Patient ascended/descended 4x6" steps using R rail and arm over PT's shoulder on L with min A. Performed step-to gait pattern leading with R while ascending and L while descending. Provided cues for technique and sequencing.  ?Patient ambulated up/down a 8"curb with CGA using RW. Provided cues for technique and use of AD. ? ?Wheelchair Mobility:  ?Patient propelled wheelchair >250 feet with supervision-mod I. Provided verbal cues for safety awareness x2. Patient able to manage leg rests with min cues today, required cues x4 for placement of breaks before transfers.  ? ?Patient in bed with Kieth Brightly at bedside at end of session with breaks locked, bed alarm set, and all needs within reach. Patient and Kieth Brightly excited for patient's planned d/c for tomorrow. Answered questions about d/c process during session. Patient and Kieth Brightly denied further questions at this time and report feeling confident about patient's d/c tomorrow.  ? ?PT Discharge ?Precautions/Restrictions ?Precautions ?Precautions: Fall;Back ?Precaution Comments: back precautions PRN for comfort (L2-3 TP fxs) ?Required Braces or Orthoses: Other Brace ?Other Brace: L CAM boot when OOB ?Restrictions ?Weight Bearing Restrictions: Yes ?RLE Weight Bearing: Weight bearing as tolerated ?LLE Weight Bearing: Weight bearing as tolerated ?Pain Interference ?Pain  Interference ?Pain Effect on Sleep: 2. Occasionally ?Pain Interference with Therapy Activities: 2. Occasionally;1. Rarely or not at all ?Pain Interference with Day-to-Day Activities: 1. Rarely or not at all ?Vision/Perception  ?Vision - History ?Ability to See in Adequate Light: 0 Adequate ?Vision - Assessment ?Ocular Range of Motion: Within Functional Limits ?Alignment/Gaze Preference: Within Defined Limits ?Tracking/Visual Pursuits: Able to track stimulus in all quads without difficulty ?Saccades: Within functional limits ?Convergence: Within functional limits ?Perception ?Perception: Within Functional Limits ?Praxis ?Praxis: Intact  ?Cognition ?Overall Cognitive Status: History of cognitive impairments - at baseline ?Arousal/Alertness: Awake/alert ?Orientation Level: Oriented X4 ?Attention: Focused ?Focused Attention: Appears intact ?Sustained Attention: Impaired ?Sustained Attention Impairment: Verbal basic;Functional basic ?Memory: Impaired ?Memory Impairment: Retrieval deficit;Decreased short term memory;Storage deficit;Decreased recall of new information ?Decreased Short Term Memory: Verbal basic;Functional basic ?Awareness: Impaired ?Awareness Impairment: Emergent impairment ?Problem Solving: Impaired ?Behaviors: Restless;Impulsive ?Safety/Judgment: Impaired ?[place] Scales of Cognitive Functioning: Purposeful/appropriate ?Sensation ?Sensation ?Light Touch: Appears Intact ?Proprioception: Appears Intact ?Coordination ?Gross Motor Movements are Fluid and Coordinated: No ?Fine Motor Movements are Fluid and Coordinated: Yes ?Coordination and Movement Description: decreased postural control and deconditioning secondary to polytrauma ?Motor  ?Motor ?Motor: Abnormal postural alignment and control ?Motor - Skilled Clinical Observations: limited by polytrauma impairing strength/ROM ?Motor - Discharge Observations: limited by polytrauma impairing strength/ROM  ?Mobility ?Bed Mobility ?Bed Mobility: Supine  to Sit;Sit to Supine;Rolling Right;Rolling Left ?Rolling Right: Independent ?Rolling Left: Independent ?Supine to Sit: Independent ?Sit to Supine: Independent ?Transfers ?Sit to Stand: Supervision/Verbal cueing
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
## 2021-05-29 NOTE — Progress Notes (Incomplete)
Patient ID: Richard Vasquez, male   DOB: 26-Jul-1960, 61 y.o.   MRN: [id number] ?
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
## 2021-05-29 NOTE — Progress Notes (Incomplete)
Inpatient Rehabilitation Discharge Medication Review by a Pharmacist ? ?A complete drug regimen review was completed for this patient to identify any potential clinically significant medication issues. ? ?High Risk Drug Classes Is patient taking? Indication by Medication  ?Antipsychotic {Receiving?:26196} Seroquel --schizophrenia ?Olanzapine--schizophrenia  ?Anticoagulant {Receiving?:26196}   ?Antibiotic {Receiving?:26196}   ?Opioid {Receiving?:26196} Oxycodone: PRN pain  ?Antiplatelet {Receiving?:26196} Aspirin '325mg'$  BID: VTE ppx  ?Hypoglycemics/insulin {Yes or No?:26198}   ?Vasoactive Medication {Receiving?:26196}   ?Chemotherapy No   ?Other {Yes or No?:26198}   ? ? ? ?Type of Medication Issue Identified Description of Issue Recommendation(s)  ?Drug Interaction(s) (clinically significant) ?    ?Duplicate Therapy ?    ?Allergy ?    ?No Medication Administration End Date ?    ?Incorrect Dose ?    ?Additional Drug Therapy Needed ?    ?Significant med changes from prior encounter (inform family/care partners about these prior to discharge).    ?Other ?    ? ? ?Clinically significant medication issues were identified that warrant physician communication and completion of prescribed/recommended actions by midnight of the next day:  {Yes or No?:26198} ? ?Name of provider notified for urgent issues identified: ***  ? ?Provider Method of Notification: ***  ? ? ?Pharmacist comments: *** ?- hctz held during IP admission ?- olanzapine incr '5mg'$  > '10mg'$  while IP ? ? ?Time spent performing this drug regimen review (minutes): 20 ? ?Thank you for allowing pharmacy to be a part of this patient?s care. ? ?Ardyth Harps, PharmD ?Clinical Pharmacist ? ?
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
## 2021-05-29 NOTE — Progress Notes (Signed)
Patient ID: Richard Vasquez, male   DOB: September 14, 1960, 61 y.o.   MRN: [id number] ? ?SW called pt s/o Kieth Brightly to provide updates from team conference, and d/c on 5/3. She was in room with pt at time of phone call. SW discussed d/c recs: RW, 3in1 BSC, w/c, and Outpatient therapies. Would like SW to order items. Preferred outpatient location is [facility].  ? ?SW ordered DME with [facility] via [facility].  ?*SW informed that he is not eligible for DME until 06/17/21 and they spoke with Kieth Brightly and will order then. ? ?SW met with pt and and pt s/o Kieth Brightly to discuss above. No questions/concerns reported.  ? ?Loralee Pacas, MSW, LCSWA ?Office: [phone] ?Cell: [phone] ?Fax: [phone]  ?

## 2021-05-30 ENCOUNTER — Other Ambulatory Visit (HOSPITAL_COMMUNITY): Payer: Self-pay

## 2021-05-30 MED ORDER — ASPIRIN 325 MG PO TBEC
DELAYED_RELEASE_TABLET | ORAL | 0 refills | Status: DC
  Filled 2021-05-30: qty 60, fill #0
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
## 2021-05-30 NOTE — Progress Notes (Signed)
Inpatient Rehabilitation Care Coordinator ?Discharge Note  ? ?Patient Details  ?Name: Richard Vasquez ?MRN: [id number] ?Date of Birth: 05/13/1960 ? ? ?Discharge location: D/c to boarding home with support from s/o and neighbors/friends ? ?Length of Stay: 8 days ? ?Discharge activity level: Supervision using RW ? ?Home/community participation: Limited ? ?Patient response ZG:YFVCBS Literacy - How often do you need to have someone help you when you read instructions, pamphlets, or other written material from your doctor or pharmacy?: Never ? ?Patient response WH:QPRFFM Isolation - How often do you feel lonely or isolated from those around you?: Never ? ?Services provided included: MD, RD, PT, SLP, CM, TR, Pharmacy, Neuropsych, SW, RN, OT ? ?Financial Services:  ?Charity fundraiser Utilized: Medicaid ?  ? ?Choices offered to/list presented to: Yes ? ?Follow-up services arranged:  ?Outpatient, DME ?   ?Outpatient Servicies: [facility] for PT/OT/SLP ?DME : [facility] for 3in1 BSC, RW, and w/c. Not eligible until 06/17/21 ?  ? ?Patient response to transportation need: ?Is the patient able to respond to transportation needs?: Yes ?In the past 12 months, has lack of transportation kept you from medical appointments or from getting medications?: No ?In the past 12 months, has lack of transportation kept you from meetings, work, or from getting things needed for daily living?: No ? ?Comments (or additional information): ? ?Patient/Family verbalized understanding of follow-up arrangements:  Yes ? ?Individual responsible for coordination of the follow-up plan: contact s/o Kieth Brightly ? ?Confirmed correct DME delivered: Rana Snare 05/30/2021   ? ?Rana Snare ?

## 2021-05-30 NOTE — Progress Notes (Signed)
INPATIENT REHABILITATION DISCHARGE NOTE ? ? ?Discharge instructions by: Linna Hoff PA ? ?Verbalized understanding:yes ? ?Skin care/Wound care healing?none ? ?Pain:none ? ?IV's:none ? ?Tubes/Drains:none ? ?O2:none ? ?Safety instructions:done ? ?Patient belongings:done ? ?Discharged VQ:WQVL ? ?Discharged DKC:[id number] ? ?Notes: ? ? ? ? ? ? ?  ?

## 2021-05-30 NOTE — Progress Notes (Signed)
Patient ID: Richard Vasquez, male   DOB: 25-Apr-1960, 61 y.o.   MRN: [id number] ? ?SW faxed outpatient PT/OT/SLP referral to [facility] ([phone]/f:[phone]). ? ?Loralee Pacas, MSW, LCSWA ?Office: [phone] ?Cell: [phone] ?Fax: [phone]  ?

## 2021-05-30 NOTE — Progress Notes (Signed)
Inpatient Rehabilitation Discharge Medication Review by a Pharmacist ? ?A complete drug regimen review was completed for this patient to identify any potential clinically significant medication issues. ? ?High Risk Drug Classes Is patient taking? Indication by Medication  ?Antipsychotic Yes Seroquel --schizophrenia ?Olanzapine--schizophrenia  ?Anticoagulant No   ?Antibiotic No   ?Opioid Yes Oxycodone: PRN pain  ?Antiplatelet Yes Aspirin '325mg'$  BID: VTE ppx  ?Hypoglycemics/insulin No   ?Vasoactive Medication No   ?Chemotherapy No   ?Other Yes Diclofenac gel: pain ?Gabapentin: neuropathy ?Methocarbamol: muscle spasm ?Pantoprazole: GERD ?Miralax, senokot-s: constipation ?Rosuvastation: HLD ?Ellipta: SOB/COPD ?HCTZ: hypertension  ? ? ? ?Type of Medication Issue Identified Description of Issue Recommendation(s)  ?Drug Interaction(s) (clinically significant) ?    ?Duplicate Therapy ?    ?Allergy ?    ?No Medication Administration End Date ?    ?Incorrect Dose ?    ?Additional Drug Therapy Needed ?    ?Significant med changes from prior encounter (inform family/care partners about these prior to discharge). Olanzapine increased from '5mg'$  > '10mg'$  daily   ?Other ?    ? ? ?Clinically significant medication issues were identified that warrant physician communication and completion of prescribed/recommended actions by midnight of the next day:  No ? ?Pharmacist comments:  ?- hctz held during IP admission but will be resuming at discharge ?- olanzapine increased from '5mg'$  > '10mg'$   ? ? ?Time spent performing this drug regimen review (minutes): 20 ? ?Thank you for allowing pharmacy to be a part of this patient?s care. ? ?Ardyth Harps, PharmD ?Clinical Pharmacist ? ?

## 2021-06-01 ENCOUNTER — Telehealth: Payer: Self-pay

## 2021-06-01 NOTE — Telephone Encounter (Signed)
Transition Care Management Unsuccessful Follow-up Telephone Call ? ?Date of discharge and from where:  05/30/2021 ? ?Attempts:  1st Attempt ? ?Reason for unsuccessful TCM follow-up call:  Left voice message for call back [phone] PM&R. ? ? ? ? ?

## 2021-06-07 ENCOUNTER — Encounter: Payer: Medicaid Other | Attending: Physical Medicine & Rehabilitation | Admitting: Physical Medicine & Rehabilitation

## 2021-06-07 DIAGNOSIS — M79605 Pain in left leg: Secondary | ICD-10-CM | POA: Insufficient documentation

## 2021-06-07 DIAGNOSIS — S069X0A Unspecified intracranial injury without loss of consciousness, initial encounter: Secondary | ICD-10-CM | POA: Insufficient documentation

## 2021-06-07 DIAGNOSIS — M25562 Pain in left knee: Secondary | ICD-10-CM | POA: Insufficient documentation

## 2021-06-11 ENCOUNTER — Emergency Department: Payer: Medicaid Other

## 2021-06-11 ENCOUNTER — Other Ambulatory Visit: Payer: Self-pay

## 2021-06-11 ENCOUNTER — Inpatient Hospital Stay
Admission: EM | Admit: 2021-06-11 | Discharge: 2021-06-15 | DRG: 917 | Disposition: A | Discharge status: Home / Self Care | Payer: Medicaid Other | Attending: Internal Medicine | Admitting: Internal Medicine

## 2021-06-11 DIAGNOSIS — G8929 Other chronic pain: Secondary | ICD-10-CM | POA: Diagnosis present

## 2021-06-11 DIAGNOSIS — T50901A Poisoning by unspecified drugs, medicaments and biological substances, accidental (unintentional), initial encounter: Secondary | ICD-10-CM | POA: Diagnosis not present

## 2021-06-11 DIAGNOSIS — T50902A Poisoning by unspecified drugs, medicaments and biological substances, intentional self-harm, initial encounter: Principal | ICD-10-CM

## 2021-06-11 DIAGNOSIS — F200 Paranoid schizophrenia: Secondary | ICD-10-CM | POA: Diagnosis present

## 2021-06-11 DIAGNOSIS — T50904A Poisoning by unspecified drugs, medicaments and biological substances, undetermined, initial encounter: Secondary | ICD-10-CM | POA: Diagnosis not present

## 2021-06-11 DIAGNOSIS — G4730 Sleep apnea, unspecified: Secondary | ICD-10-CM | POA: Diagnosis present

## 2021-06-11 DIAGNOSIS — F419 Anxiety disorder, unspecified: Secondary | ICD-10-CM | POA: Diagnosis present

## 2021-06-11 DIAGNOSIS — G9341 Metabolic encephalopathy: Secondary | ICD-10-CM

## 2021-06-11 DIAGNOSIS — F209 Schizophrenia, unspecified: Secondary | ICD-10-CM | POA: Diagnosis present

## 2021-06-11 DIAGNOSIS — R579 Shock, unspecified: Secondary | ICD-10-CM | POA: Diagnosis present

## 2021-06-11 DIAGNOSIS — R410 Disorientation, unspecified: Secondary | ICD-10-CM | POA: Diagnosis present

## 2021-06-11 DIAGNOSIS — N400 Enlarged prostate without lower urinary tract symptoms: Secondary | ICD-10-CM | POA: Diagnosis present

## 2021-06-11 DIAGNOSIS — Z87820 Personal history of traumatic brain injury: Secondary | ICD-10-CM

## 2021-06-11 DIAGNOSIS — Z87891 Personal history of nicotine dependence: Secondary | ICD-10-CM

## 2021-06-11 DIAGNOSIS — F29 Unspecified psychosis not due to a substance or known physiological condition: Secondary | ICD-10-CM | POA: Diagnosis present

## 2021-06-11 DIAGNOSIS — S069XAA Unspecified intracranial injury with loss of consciousness status unknown, initial encounter: Secondary | ICD-10-CM | POA: Diagnosis present

## 2021-06-11 DIAGNOSIS — F32A Depression, unspecified: Secondary | ICD-10-CM | POA: Diagnosis present

## 2021-06-11 DIAGNOSIS — T50901D Poisoning by unspecified drugs, medicaments and biological substances, accidental (unintentional), subsequent encounter: Secondary | ICD-10-CM | POA: Diagnosis not present

## 2021-06-11 DIAGNOSIS — T43592A Poisoning by other antipsychotics and neuroleptics, intentional self-harm, initial encounter: Secondary | ICD-10-CM | POA: Diagnosis present

## 2021-06-11 DIAGNOSIS — Z79899 Other long term (current) drug therapy: Secondary | ICD-10-CM

## 2021-06-11 DIAGNOSIS — Z7982 Long term (current) use of aspirin: Secondary | ICD-10-CM

## 2021-06-11 DIAGNOSIS — A419 Sepsis, unspecified organism: Secondary | ICD-10-CM | POA: Diagnosis not present

## 2021-06-11 DIAGNOSIS — I959 Hypotension, unspecified: Secondary | ICD-10-CM | POA: Diagnosis not present

## 2021-06-11 DIAGNOSIS — I10 Essential (primary) hypertension: Secondary | ICD-10-CM | POA: Diagnosis present

## 2021-06-11 DIAGNOSIS — G928 Other toxic encephalopathy: Secondary | ICD-10-CM | POA: Diagnosis present

## 2021-06-11 LAB — COMPREHENSIVE METABOLIC PANEL
ALT: 9 U/L (ref 0–44)
AST: 15 U/L (ref 15–41)
Albumin: 3.7 g/dL (ref 3.5–5.0)
Alkaline Phosphatase: 134 U/L — ABNORMAL HIGH (ref 38–126)
Anion gap: 11 (ref 5–15)
BUN: 8 mg/dL (ref 6–20)
CO2: 23 mmol/L (ref 22–32)
Calcium: 9.3 mg/dL (ref 8.9–10.3)
Chloride: 108 mmol/L (ref 98–111)
Creatinine, Ser: 0.66 mg/dL (ref 0.61–1.24)
GFR, Estimated: >60 mL/min (ref >60)
Glucose, Bld: 141 mg/dL — ABNORMAL HIGH (ref 70–99)
Potassium: 3.7 mmol/L (ref 3.5–5.1)
Sodium: 142 mmol/L (ref 135–145)
Total Bilirubin: 0.6 mg/dL (ref 0.3–1.2)
Total Protein: 7.5 g/dL (ref 6.5–8.1)

## 2021-06-11 LAB — CBC WITH DIFFERENTIAL/PLATELET
Abs Immature Granulocytes: 0.06 10*3/uL (ref 0.00–0.07)
Basophils Absolute: 0 10*3/uL (ref 0.0–0.1)
Basophils Relative: 0 %
Eosinophils Absolute: 0.3 10*3/uL (ref 0.0–0.5)
Eosinophils Relative: 3 %
HCT: 32.3 % — ABNORMAL LOW (ref 39.0–52.0)
Hemoglobin: 10.2 g/dL — ABNORMAL LOW (ref 13.0–17.0)
Immature Granulocytes: 1 %
Lymphocytes Relative: 21 %
Lymphs Abs: 2.1 10*3/uL (ref 0.7–4.0)
MCH: 29.9 pg (ref 26.0–34.0)
MCHC: 31.6 g/dL (ref 30.0–36.0)
MCV: 94.7 fL (ref 80.0–100.0)
Monocytes Absolute: 0.8 10*3/uL (ref 0.1–1.0)
Monocytes Relative: 8 %
Neutro Abs: 6.7 10*3/uL (ref 1.7–7.7)
Neutrophils Relative %: 67 %
Platelets: 252 10*3/uL (ref 150–400)
RBC: 3.41 MIL/uL — ABNORMAL LOW (ref 4.22–5.81)
RDW: 13.5 % (ref 11.5–15.5)
WBC: 9.9 10*3/uL (ref 4.0–10.5)
nRBC: 0 % (ref 0.0–0.2)

## 2021-06-11 LAB — URINE DRUG SCREEN, QUALITATIVE (ARMC ONLY)
Amphetamines, Ur Screen: NOT DETECTED
Barbiturates, Ur Screen: NOT DETECTED
Benzodiazepine, Ur Scrn: NOT DETECTED
Cannabinoid 50 Ng, Ur ~~LOC~~: NOT DETECTED
Cocaine Metabolite,Ur ~~LOC~~: NOT DETECTED
MDMA (Ecstasy)Ur Screen: NOT DETECTED
Methadone Scn, Ur: NOT DETECTED
Opiate, Ur Screen: NOT DETECTED
Phencyclidine (PCP) Ur S: NOT DETECTED
Tricyclic, Ur Screen: NOT DETECTED

## 2021-06-11 LAB — SALICYLATE LEVEL
Salicylate Lvl: <7 mg/dL — ABNORMAL LOW (ref 7.0–30.0)
Salicylate Lvl: <7 mg/dL — ABNORMAL LOW (ref 7.0–30.0)

## 2021-06-11 LAB — ACETAMINOPHEN LEVEL
Acetaminophen (Tylenol), Serum: <10 ug/mL — ABNORMAL LOW (ref 10–30)
Acetaminophen (Tylenol), Serum: <10 ug/mL — ABNORMAL LOW (ref 10–30)

## 2021-06-11 LAB — MAGNESIUM: Magnesium: 2.2 mg/dL (ref 1.7–2.4)

## 2021-06-11 LAB — ETHANOL: Alcohol, Ethyl (B): <10 mg/dL (ref <10)

## 2021-06-11 MED ORDER — LORAZEPAM 2 MG/ML IJ SOLN
2.0000 mg | Freq: Once | INTRAMUSCULAR | Status: AC
  Administered 2021-06-11: 2 mg via INTRAVENOUS
  Filled 2021-06-11: qty 1

## 2021-06-11 NOTE — ED Notes (Signed)
Poison control update at this time- RN danielle stated that per the current dosing of '4mg'$  ativan total that a different benzo recommended was diazepam 5-'10mg'$ .  Repeat ekg recommended as well.  ?
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
## 2021-06-11 NOTE — ED Notes (Addendum)
Mateo Flow RN poison control suggested to do a 4hr post ingestion level of tylenol and salycilate - at 2230pm as well as a repeat EKG in 8 hours. Recommenced fluids, and benzos as needed . And add on a magnesium level.  ?

## 2021-06-11 NOTE — ED Notes (Signed)
Patient has bed alarm, yellow socks, and wrist band in place at this time. Pt also has sitter tech in the room. ?

## 2021-06-11 NOTE — ED Notes (Signed)
per fiance this was the list of medications the patient may or may not take at night. aspirin '324mg'$ , gabapentin '300mg'$  1 at night, roboxan, oxycodone '5mg'$  as needed, trazadone as needed, 1 tablet zyprexa at night. per fiance the patient did not have his glasses on when trying to take his night time medications. and  ?

## 2021-06-12 ENCOUNTER — Inpatient Hospital Stay: Payer: Medicaid Other

## 2021-06-12 DIAGNOSIS — Z8781 Personal history of (healed) traumatic fracture: Secondary | ICD-10-CM | POA: Insufficient documentation

## 2021-06-12 DIAGNOSIS — T50901A Poisoning by unspecified drugs, medicaments and biological substances, accidental (unintentional), initial encounter: Secondary | ICD-10-CM | POA: Diagnosis not present

## 2021-06-12 DIAGNOSIS — G9341 Metabolic encephalopathy: Secondary | ICD-10-CM

## 2021-06-12 DIAGNOSIS — T50904A Poisoning by unspecified drugs, medicaments and biological substances, undetermined, initial encounter: Secondary | ICD-10-CM | POA: Diagnosis not present

## 2021-06-12 DIAGNOSIS — T50904S Poisoning by unspecified drugs, medicaments and biological substances, undetermined, sequela: Secondary | ICD-10-CM

## 2021-06-12 MED ORDER — HYDRALAZINE HCL 20 MG/ML IJ SOLN: 5.0000 mg | INTRAMUSCULAR | Status: DC | PRN

## 2021-06-12 MED ORDER — OXYCODONE HCL 5 MG PO TABS
5.0000 mg | ORAL_TABLET | Freq: Three times a day (TID) | ORAL | Status: DC | PRN
  Administered 2021-06-12: 5 mg via ORAL
  Filled 2021-06-12: qty 1

## 2021-06-12 MED ORDER — OLANZAPINE 5 MG PO TABS
5.0000 mg | ORAL_TABLET | Freq: Two times a day (BID) | ORAL | Status: DC
Start: 2021-06-12 — End: 2021-06-15
  Administered 2021-06-12 – 2021-06-15 (×5): 5 mg via ORAL
  Filled 2021-06-12 (×6): qty 1

## 2021-06-12 MED ORDER — ENOXAPARIN SODIUM 40 MG/0.4ML IJ SOSY
40.0000 mg | PREFILLED_SYRINGE | INTRAMUSCULAR | Status: DC
  Administered 2021-06-12 – 2021-06-14 (×3): 40 mg via SUBCUTANEOUS
  Filled 2021-06-12 (×3): qty 0.4

## 2021-06-12 MED ORDER — OXYCODONE HCL 5 MG PO TABS
10.0000 mg | ORAL_TABLET | Freq: Four times a day (QID) | ORAL | Status: DC | PRN
  Administered 2021-06-13 – 2021-06-14 (×2): 10 mg via ORAL
  Filled 2021-06-12 (×2): qty 2

## 2021-06-12 MED ORDER — DICLOFENAC SODIUM 1 % EX GEL
2.0000 g | Freq: Four times a day (QID) | CUTANEOUS | Status: DC | PRN
  Filled 2021-06-12: qty 100

## 2021-06-12 MED ORDER — ACETAMINOPHEN 650 MG RE SUPP: 650.0000 mg | Freq: Four times a day (QID) | RECTAL | Status: DC | PRN

## 2021-06-12 MED ORDER — IBUPROFEN 400 MG PO TABS: 400.0000 mg | ORAL_TABLET | Freq: Once | ORAL | Status: DC

## 2021-06-12 MED ORDER — ONDANSETRON HCL 4 MG/2ML IJ SOLN: 4.0000 mg | Freq: Four times a day (QID) | INTRAMUSCULAR | Status: DC | PRN

## 2021-06-12 MED ORDER — POLYETHYLENE GLYCOL 3350 17 G PO PACK
17.0000 g | PACK | Freq: Every day | ORAL | Status: DC
  Administered 2021-06-12 – 2021-06-15 (×3): 17 g via ORAL
  Filled 2021-06-12 (×3): qty 1

## 2021-06-12 MED ORDER — DILTIAZEM HCL ER COATED BEADS 120 MG PO CP24
120.0000 mg | ORAL_CAPSULE | Freq: Every day | ORAL | Status: DC
  Administered 2021-06-12: 120 mg via ORAL
  Filled 2021-06-12: qty 1

## 2021-06-12 MED ORDER — ONDANSETRON HCL 4 MG PO TABS: 4.0000 mg | ORAL_TABLET | Freq: Four times a day (QID) | ORAL | Status: DC | PRN

## 2021-06-12 MED ORDER — TIOTROPIUM BROMIDE MONOHYDRATE 18 MCG IN CAPS
1.0000 | ORAL_CAPSULE | Freq: Every day | RESPIRATORY_TRACT | Status: DC
  Administered 2021-06-13 – 2021-06-15 (×3): 18 ug via RESPIRATORY_TRACT
  Filled 2021-06-12 (×2): qty 5

## 2021-06-12 MED ORDER — MORPHINE SULFATE (PF) 2 MG/ML IV SOLN
1.0000 mg | Freq: Once | INTRAVENOUS | Status: AC
  Administered 2021-06-12: 1 mg via INTRAVENOUS
  Filled 2021-06-12: qty 1

## 2021-06-12 MED ORDER — ALBUTEROL SULFATE (2.5 MG/3ML) 0.083% IN NEBU: 2.5000 mg | INHALATION_SOLUTION | Freq: Four times a day (QID) | RESPIRATORY_TRACT | Status: DC | PRN

## 2021-06-12 MED ORDER — CALCIUM GLUCONATE-NACL 2-0.675 GM/100ML-% IV SOLN
2.0000 g | Freq: Once | INTRAVENOUS | Status: AC
  Administered 2021-06-13: 2000 mg via INTRAVENOUS
  Filled 2021-06-12: qty 100

## 2021-06-12 MED ORDER — DILTIAZEM HCL 25 MG/5ML IV SOLN
10.0000 mg | INTRAVENOUS | Status: DC | PRN
  Administered 2021-06-12: 10 mg via INTRAVENOUS
  Filled 2021-06-12: qty 5

## 2021-06-12 MED ORDER — PANTOPRAZOLE SODIUM 40 MG PO TBEC
40.0000 mg | DELAYED_RELEASE_TABLET | Freq: Every day | ORAL | Status: DC
  Administered 2021-06-12 – 2021-06-15 (×4): 40 mg via ORAL
  Filled 2021-06-12 (×4): qty 1

## 2021-06-12 MED ORDER — ASPIRIN EC 325 MG PO TBEC
325.0000 mg | DELAYED_RELEASE_TABLET | Freq: Two times a day (BID) | ORAL | Status: DC
  Administered 2021-06-12 – 2021-06-14 (×3): 325 mg via ORAL
  Filled 2021-06-12 (×3): qty 1

## 2021-06-12 MED ORDER — ACETAMINOPHEN 325 MG PO TABS
650.0000 mg | ORAL_TABLET | Freq: Four times a day (QID) | ORAL | Status: DC | PRN
  Administered 2021-06-12 (×2): 650 mg via ORAL
  Filled 2021-06-12 (×2): qty 2

## 2021-06-12 MED ORDER — HYDROCHLOROTHIAZIDE 25 MG PO TABS
12.5000 mg | ORAL_TABLET | Freq: Every day | ORAL | Status: DC
Start: 2021-06-12 — End: 2021-06-14
  Administered 2021-06-12: 12.5 mg via ORAL
  Filled 2021-06-12 (×2): qty 1

## 2021-06-12 MED ORDER — MORPHINE SULFATE (PF) 2 MG/ML IV SOLN
2.0000 mg | Freq: Once | INTRAVENOUS | Status: AC
  Administered 2021-06-12: 2 mg via INTRAVENOUS
  Filled 2021-06-12: qty 1

## 2021-06-12 MED ORDER — GABAPENTIN 100 MG PO CAPS
100.0000 mg | ORAL_CAPSULE | Freq: Three times a day (TID) | ORAL | Status: DC
Start: 2021-06-12 — End: 2021-06-15
  Administered 2021-06-12 – 2021-06-15 (×7): 100 mg via ORAL
  Filled 2021-06-12 (×7): qty 1

## 2021-06-12 MED ORDER — ROSUVASTATIN CALCIUM 10 MG PO TABS
10.0000 mg | ORAL_TABLET | Freq: Every day | ORAL | Status: DC
  Administered 2021-06-12 – 2021-06-15 (×4): 10 mg via ORAL
  Filled 2021-06-12 (×4): qty 1

## 2021-06-12 MED ORDER — LACTATED RINGERS IV BOLUS
1000.0000 mL | Freq: Once | INTRAVENOUS | Status: AC
  Administered 2021-06-12: 1000 mL via INTRAVENOUS

## 2021-06-12 MED ORDER — MIDODRINE HCL 5 MG PO TABS
5.0000 mg | ORAL_TABLET | Freq: Once | ORAL | Status: AC
  Administered 2021-06-12: 5 mg via ORAL
  Filled 2021-06-12: qty 1

## 2021-06-12 MED ORDER — FERROUS SULFATE 325 (65 FE) MG PO TABS
325.0000 mg | ORAL_TABLET | Freq: Two times a day (BID) | ORAL | Status: DC
  Administered 2021-06-12 – 2021-06-15 (×5): 325 mg via ORAL
  Filled 2021-06-12 (×5): qty 1

## 2021-06-12 NOTE — Progress Notes (Incomplete)
? ?      CROSS COVER NOTE ? ?NAME: JOCOB DAMBACH ?MRN: [id number] ?DOB : 04/27/1960 ? ? ? ?Date of Service ?    ?HPI/Events of Note ?  Hypotension ? ?IV dilt ~1814 ? ?Drowsy, hypotensive, febrile ? ?HR 130s-140s today now 107 BP 77/57 ? ?Contacted poison control though symptoms correlate with Zyprexa overdose, it is late in clinical presentation for symptoms. Will treat CCB toxicity first.  ?Interventions ?  Plan: ?1L LR ?Ca? ?Midodrine ?   ?  ? ?

## 2021-06-12 NOTE — ED Notes (Addendum)
This RN on phone with poison control and updated on pt status. Repeat EKG done per request. Poison control stating pt is cleared on their end.  ?

## 2021-06-12 NOTE — ED Notes (Signed)
Pt's girlfriend let back to see pt and this RN was not called or made aware. Pt currently IVC'd. Girlfriend asked to step out of room and be wanded and provide security with personal belongings.  ?

## 2021-06-12 NOTE — ED Notes (Signed)
Pt continues to sleep, relax and calm at this time, NAD noted at current, VSS for pt, pt continue to wear safety mittens to bilateral hands, safety in place, staff to continue to monitor pt ?

## 2021-06-12 NOTE — ED Notes (Signed)
Pt appears more at ease and calmer, pt continue to wear safety mittens to bilateral hands as he has ha tendency trying to remove cardiac monitoring devices and IV. Safety sitter remains at the bedside for continuous 1:1 monitoring.  ?

## 2021-06-12 NOTE — Assessment & Plan Note (Addendum)
Supportive care. 

## 2021-06-12 NOTE — ED Notes (Signed)
Pt appears more at ease and calmer, appears to be sleeping, NAD noted at current, VSS for pt, pt continue to wear safety mittens to bilateral hands, safety in place, staff to continue to monitor pt ?

## 2021-06-12 NOTE — Assessment & Plan Note (Addendum)
Overdose of Zyprexa, unintentional as per patient and fianc?Marland Kitchen ?Appreciate poison control recommendations for IV fluid and as needed benzodiazepine ?Psychiatry consult to help adjust medications. ?
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
## 2021-06-12 NOTE — ED Notes (Signed)
Safety sitter d/c at this time, d/t pt's calmer state and pt more at rest, door to treatment room remains open, staff able to monitor pt inside room, remains on cardiac devices.  ?
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
## 2021-06-12 NOTE — ED Notes (Addendum)
Pt continues to be fidgety, moving around in bed, however not as frequent and not trying to pull at monitor cords as often, pt has safety mittens to bilateral hands, pt trying to remove those while eyes closed. Unable to distract pt at this time. When trying to readdress pt, pt will swing at RN, Safety sitter remains at the bedside for continuous 1:1 monitoring.  ?
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
## 2021-06-12 NOTE — ED Notes (Signed)
Pt fidgety, moving around in bed, trying to pull at monitor cords, pt has safety mittens to bilateral hands, pt trying to remove those. Unable to distract pt at this time. Safety sitter remains at the bedside for continuous 1:1 monitoring.  ?

## 2021-06-12 NOTE — Assessment & Plan Note (Addendum)
Improved from admission.  Okay to restart diet.  Holding medications that can cause lethargy except can restart oxycodone this afternoon. ?

## 2021-06-12 NOTE — Assessment & Plan Note (Addendum)
Restart hydrochlorothiazide with last blood pressure being elevated. ?

## 2021-06-12 NOTE — Assessment & Plan Note (Addendum)
Restart 5 mg oxycodone every 8 hours as needed.  Holding other medications at this point ?
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
## 2021-06-12 NOTE — Assessment & Plan Note (Addendum)
Psychiatry consultation.  [name]? wants to make sure his meds are good. ? ?

## 2021-06-12 NOTE — ED Notes (Signed)
New male external catheter applied to Pt. Pt accepting at this time.  ?

## 2021-06-13 ENCOUNTER — Inpatient Hospital Stay: Payer: Medicaid Other

## 2021-06-13 DIAGNOSIS — T50901A Poisoning by unspecified drugs, medicaments and biological substances, accidental (unintentional), initial encounter: Secondary | ICD-10-CM

## 2021-06-13 DIAGNOSIS — G9341 Metabolic encephalopathy: Secondary | ICD-10-CM

## 2021-06-13 DIAGNOSIS — R410 Disorientation, unspecified: Secondary | ICD-10-CM

## 2021-06-13 DIAGNOSIS — I959 Hypotension, unspecified: Secondary | ICD-10-CM

## 2021-06-13 DIAGNOSIS — T50902A Poisoning by unspecified drugs, medicaments and biological substances, intentional self-harm, initial encounter: Secondary | ICD-10-CM

## 2021-06-13 LAB — CBC
HCT: 28.4 % — ABNORMAL LOW (ref 39.0–52.0)
HCT: 29.6 % — ABNORMAL LOW (ref 39.0–52.0)
Hemoglobin: 9.1 g/dL — ABNORMAL LOW (ref 13.0–17.0)
Hemoglobin: 9.6 g/dL — ABNORMAL LOW (ref 13.0–17.0)
MCH: 29.4 pg (ref 26.0–34.0)
MCH: 29.8 pg (ref 26.0–34.0)
MCHC: 32 g/dL (ref 30.0–36.0)
MCHC: 32.4 g/dL (ref 30.0–36.0)
MCV: 91.6 fL (ref 80.0–100.0)
MCV: 91.9 fL (ref 80.0–100.0)
Platelets: 206 10*3/uL (ref 150–400)
Platelets: 225 10*3/uL (ref 150–400)
RBC: 3.1 MIL/uL — ABNORMAL LOW (ref 4.22–5.81)
RBC: 3.22 MIL/uL — ABNORMAL LOW (ref 4.22–5.81)
RDW: 13.4 % (ref 11.5–15.5)
RDW: 13.6 % (ref 11.5–15.5)
WBC: 15.8 10*3/uL — ABNORMAL HIGH (ref 4.0–10.5)
WBC: 19.9 10*3/uL — ABNORMAL HIGH (ref 4.0–10.5)
nRBC: 0 % (ref 0.0–0.2)
nRBC: 0 % (ref 0.0–0.2)

## 2021-06-13 LAB — URINALYSIS, ROUTINE W REFLEX MICROSCOPIC
Bilirubin Urine: NEGATIVE
Glucose, UA: NEGATIVE mg/dL
Ketones, ur: NEGATIVE mg/dL
Nitrite: NEGATIVE
Protein, ur: NEGATIVE mg/dL
Specific Gravity, Urine: 1.004 — ABNORMAL LOW (ref 1.005–1.030)
Squamous Epithelial / HPF: NONE SEEN (ref 0–5)
pH: 7 (ref 5.0–8.0)

## 2021-06-13 LAB — COMPREHENSIVE METABOLIC PANEL
ALT: 9 U/L (ref 0–44)
AST: 13 U/L — ABNORMAL LOW (ref 15–41)
Albumin: 3 g/dL — ABNORMAL LOW (ref 3.5–5.0)
Alkaline Phosphatase: 104 U/L (ref 38–126)
Anion gap: 8 (ref 5–15)
BUN: 25 mg/dL — ABNORMAL HIGH (ref 6–20)
CO2: 22 mmol/L (ref 22–32)
Calcium: 8.5 mg/dL — ABNORMAL LOW (ref 8.9–10.3)
Chloride: 105 mmol/L (ref 98–111)
Creatinine, Ser: 0.92 mg/dL (ref 0.61–1.24)
GFR, Estimated: >60 mL/min (ref >60)
Glucose, Bld: 103 mg/dL — ABNORMAL HIGH (ref 70–99)
Potassium: 3.5 mmol/L (ref 3.5–5.1)
Sodium: 135 mmol/L (ref 135–145)
Total Bilirubin: 0.3 mg/dL (ref 0.3–1.2)
Total Protein: 6.3 g/dL — ABNORMAL LOW (ref 6.5–8.1)

## 2021-06-13 LAB — MAGNESIUM
Magnesium: 1.9 mg/dL (ref 1.7–2.4)
Magnesium: 2.2 mg/dL (ref 1.7–2.4)

## 2021-06-13 LAB — BASIC METABOLIC PANEL
Anion gap: 9 (ref 5–15)
BUN: 21 mg/dL — ABNORMAL HIGH (ref 6–20)
CO2: 23 mmol/L (ref 22–32)
Calcium: 9.1 mg/dL (ref 8.9–10.3)
Chloride: 107 mmol/L (ref 98–111)
Creatinine, Ser: 0.75 mg/dL (ref 0.61–1.24)
GFR, Estimated: >60 mL/min (ref >60)
Glucose, Bld: 103 mg/dL — ABNORMAL HIGH (ref 70–99)
Potassium: 3.5 mmol/L (ref 3.5–5.1)
Sodium: 139 mmol/L (ref 135–145)

## 2021-06-13 LAB — LACTIC ACID, PLASMA
Lactic Acid, Venous: 0.7 mmol/L (ref 0.5–1.9)
Lactic Acid, Venous: 0.7 mmol/L (ref 0.5–1.9)

## 2021-06-13 LAB — GLUCOSE, CAPILLARY: Glucose-Capillary: 96 mg/dL (ref 70–99)

## 2021-06-13 LAB — PROCALCITONIN
Procalcitonin: 0.31 ng/mL
Procalcitonin: 0.36 ng/mL

## 2021-06-13 LAB — MRSA NEXT GEN BY PCR, NASAL: MRSA by PCR Next Gen: NOT DETECTED

## 2021-06-13 MED ORDER — LACTATED RINGERS IV BOLUS
1000.0000 mL | Freq: Once | INTRAVENOUS | Status: AC
  Administered 2021-06-13: 1000 mL via INTRAVENOUS

## 2021-06-13 MED ORDER — VANCOMYCIN HCL 750 MG/150ML IV SOLN
750.0000 mg | Freq: Two times a day (BID) | INTRAVENOUS | Status: DC
Start: 2021-06-13 — End: 2021-06-14
  Administered 2021-06-13 – 2021-06-14 (×2): 750 mg via INTRAVENOUS
  Filled 2021-06-13 (×2): qty 150

## 2021-06-13 MED ORDER — MIDODRINE HCL 5 MG PO TABS
10.0000 mg | ORAL_TABLET | Freq: Three times a day (TID) | ORAL | Status: DC
  Administered 2021-06-13 – 2021-06-15 (×7): 10 mg via ORAL
  Filled 2021-06-13 (×7): qty 2

## 2021-06-13 MED ORDER — SODIUM CHLORIDE 0.9 % IV SOLN
250.0000 mL | INTRAVENOUS | Status: DC
Start: 2021-06-13 — End: 2021-06-15
  Administered 2021-06-13: 250 mL via INTRAVENOUS

## 2021-06-13 MED ORDER — VANCOMYCIN HCL 1500 MG/300ML IV SOLN
1500.0000 mg | Freq: Once | INTRAVENOUS | Status: AC
  Administered 2021-06-13: 1500 mg via INTRAVENOUS
  Filled 2021-06-13: qty 300

## 2021-06-13 MED ORDER — GLUCAGON HCL RDNA (DIAGNOSTIC) 1 MG IJ SOLR
5.0000 mg | Freq: Once | INTRAVENOUS | Status: AC
  Administered 2021-06-13: 5 mg via INTRAVENOUS
  Filled 2021-06-13: qty 5

## 2021-06-13 MED ORDER — IBUPROFEN 400 MG PO TABS: 400.0000 mg | ORAL_TABLET | Freq: Once | ORAL | Status: DC

## 2021-06-13 MED ORDER — NOREPINEPHRINE 4 MG/250ML-% IV SOLN
2.0000 ug/min | INTRAVENOUS | Status: DC
  Administered 2021-06-13: 2 ug/min via INTRAVENOUS
  Filled 2021-06-13 (×2): qty 250

## 2021-06-13 MED ORDER — CEFEPIME HCL 2 G IV SOLR
2.0000 g | Freq: Three times a day (TID) | INTRAVENOUS | Status: AC
  Administered 2021-06-13 – 2021-06-14 (×5): 2 g via INTRAVENOUS
  Filled 2021-06-13: qty 2
  Filled 2021-06-13 (×2): qty 12.5
  Filled 2021-06-13: qty 2
  Filled 2021-06-13: qty 12.5

## 2021-06-13 MED ORDER — LACTATED RINGERS IV BOLUS
1000.0000 mL | Freq: Once | INTRAVENOUS | Status: AC
Start: 2021-06-13 — End: 2021-06-13
  Administered 2021-06-13: 1000 mL via INTRAVENOUS

## 2021-06-13 NOTE — Progress Notes (Signed)
Pharmacy Antibiotic Note ? ?[name] is a 61 y.o. male admitted on 06/11/2021 with accidental drug overdose .  Pharmacy has been consulted for vancomycin and cefepime dosing. Renal function is stable ? ?Plan: ? ?1) start cefepime 2 grams IV every 8 hours ? ?2) start vancomycin 1500 mg IV x 1 then 750 mg IV every 12 hrs ?Goal AUC 400-550 ?Expected AUC: 428.8 ?SCr used: 0.80 mg/dL ?Ke 0.089 h-1, T1/2 7.8h ?Daily serum creatinine while on IV vancomycin ? ? ?Height: '5\' 10"'$  (177.8 cm) ?Weight: 79 kg (174 lb 2.6 oz) ?IBW/kg (Calculated) : 73 ? ?Temp (24hrs), Avg:99.8 ?F (37.7 ?C), Min:97.3 ?F (36.3 ?C), Max:102.9 ?F (39.4 ?C) ? ?Recent Labs  ?Lab 06/11/21 ?1930 06/12/21 ?2355 06/13/21 ?0144 06/13/21 ?2202  ?WBC 9.9 15.8*  --  19.9*  ?CREATININE 0.66 0.92  --   --   ?LATICACIDVEN  --  0.7 0.7  --   ?  ?Estimated Creatinine Clearance: 88.2 mL/min (by C-G formula based on SCr of 0.92 mg/dL).   ? ?No Known Allergies ? ?Antimicrobials this admission: ?vancomycin 05/17 >>  ?cefepime 05/17 >>  ? ?Microbiology results: ?05/17 BCx: pending ?05/17 MRSA PCR: pending ? ?Thank you for allowing pharmacy to be a part of this patient?s care. ? ?Dallie Piles ?06/13/2021 8:28 AM ? ?
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
## 2021-06-13 NOTE — Progress Notes (Signed)
Towards the end of the shift pt became more agitated, fidgety, Tachy HR 120-130's, all over the place with his conversations. Complaining of no pain one second next second it is a 5 out of 10 pain. I notified MD, he advised ok to give prn oxy. Will continue to monitor. ?

## 2021-06-13 NOTE — Plan of Care (Signed)

## 2021-06-13 NOTE — Progress Notes (Signed)
An USGPIV (ultrasound guided PIV) has been placed in R anterior forearm for short-term vasopressor infusion. A correctly placed ivWatch must be used when administering Vasopressors. Should this treatment be needed beyond 72 hours, central line access should be obtained.  It will be the responsibility of the bedside nurse to follow best practice to prevent extravasations.   ?

## 2021-06-14 ENCOUNTER — Other Ambulatory Visit: Payer: Self-pay

## 2021-06-14 DIAGNOSIS — T50901A Poisoning by unspecified drugs, medicaments and biological substances, accidental (unintentional), initial encounter: Secondary | ICD-10-CM | POA: Diagnosis not present

## 2021-06-14 LAB — PROCALCITONIN: Procalcitonin: 1.88 ng/mL

## 2021-06-14 LAB — RENAL FUNCTION PANEL
Albumin: 3 g/dL — ABNORMAL LOW (ref 3.5–5.0)
Anion gap: 6 (ref 5–15)
BUN: 14 mg/dL (ref 6–20)
CO2: 23 mmol/L (ref 22–32)
Calcium: 8.4 mg/dL — ABNORMAL LOW (ref 8.9–10.3)
Chloride: 109 mmol/L (ref 98–111)
Creatinine, Ser: 0.73 mg/dL (ref 0.61–1.24)
GFR, Estimated: >60 mL/min (ref >60)
Glucose, Bld: 97 mg/dL (ref 70–99)
Phosphorus: 3.1 mg/dL (ref 2.5–4.6)
Potassium: 3.4 mmol/L — ABNORMAL LOW (ref 3.5–5.1)
Sodium: 138 mmol/L (ref 135–145)

## 2021-06-14 LAB — CBC
HCT: 29.8 % — ABNORMAL LOW (ref 39.0–52.0)
Hemoglobin: 9.5 g/dL — ABNORMAL LOW (ref 13.0–17.0)
MCH: 29.4 pg (ref 26.0–34.0)
MCHC: 31.9 g/dL (ref 30.0–36.0)
MCV: 92.3 fL (ref 80.0–100.0)
Platelets: 185 10*3/uL (ref 150–400)
RBC: 3.23 MIL/uL — ABNORMAL LOW (ref 4.22–5.81)
RDW: 13.5 % (ref 11.5–15.5)
WBC: 13.3 10*3/uL — ABNORMAL HIGH (ref 4.0–10.5)
nRBC: 0 % (ref 0.0–0.2)

## 2021-06-14 LAB — MAGNESIUM: Magnesium: 1.9 mg/dL (ref 1.7–2.4)

## 2021-06-14 MED ORDER — AMPICILLIN-SULBACTAM SODIUM 3 (2-1) G IJ SOLR
3.0000 g | Freq: Four times a day (QID) | INTRAMUSCULAR | Status: DC
  Administered 2021-06-15: 3 g via INTRAVENOUS
  Filled 2021-06-14 (×2): qty 8

## 2021-06-14 MED ORDER — CHLORHEXIDINE GLUCONATE CLOTH 2 % EX PADS
6.0000 | MEDICATED_PAD | Freq: Every day | CUTANEOUS | Status: DC
  Administered 2021-06-14 – 2021-06-15 (×2): 6 via TOPICAL

## 2021-06-14 MED ORDER — METHOCARBAMOL 500 MG PO TABS: 750.0000 mg | ORAL_TABLET | Freq: Four times a day (QID) | ORAL | Status: DC | PRN

## 2021-06-14 NOTE — Progress Notes (Signed)
Vital signs reviewed, ICU needs resolved  Will sign off at this time. No further recommendations at this time.     [name] David Tereasa Yilmaz, M.D.  [facility]  Medical Director ICU-[facility] [place] Medical Director [facility] Cardio-Pulmonary Department   
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
## 2021-06-14 NOTE — Plan of Care (Signed)

## 2021-06-14 NOTE — Consult Note (Signed)
Patient seen face-to-face. He is coherent, speaking in linear sentences. Denies auditory or visual hallucinations or paranoia. Pleasant and cooperative. Does not appear to be responding to internal stimuli. No unsafe behavior noted. He states he is feeling much better, no pain at time of assessment. Fiance at bedside. Writer released IVC.  Sherlon Handing, PMHNP-BC
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
## 2021-06-15 DIAGNOSIS — T50901D Poisoning by unspecified drugs, medicaments and biological substances, accidental (unintentional), subsequent encounter: Secondary | ICD-10-CM

## 2021-06-15 LAB — CBC WITH DIFFERENTIAL/PLATELET
Abs Immature Granulocytes: 0.03 10*3/uL (ref 0.00–0.07)
Basophils Absolute: 0 10*3/uL (ref 0.0–0.1)
Basophils Relative: 0 %
Eosinophils Absolute: 0.3 10*3/uL (ref 0.0–0.5)
Eosinophils Relative: 3 %
HCT: 28.2 % — ABNORMAL LOW (ref 39.0–52.0)
Hemoglobin: 8.9 g/dL — ABNORMAL LOW (ref 13.0–17.0)
Immature Granulocytes: 0 %
Lymphocytes Relative: 15 %
Lymphs Abs: 1.3 10*3/uL (ref 0.7–4.0)
MCH: 29.1 pg (ref 26.0–34.0)
MCHC: 31.6 g/dL (ref 30.0–36.0)
MCV: 92.2 fL (ref 80.0–100.0)
Monocytes Absolute: 0.6 10*3/uL (ref 0.1–1.0)
Monocytes Relative: 7 %
Neutro Abs: 6.7 10*3/uL (ref 1.7–7.7)
Neutrophils Relative %: 75 %
Platelets: 196 10*3/uL (ref 150–400)
RBC: 3.06 MIL/uL — ABNORMAL LOW (ref 4.22–5.81)
RDW: 13.2 % (ref 11.5–15.5)
WBC: 9 10*3/uL (ref 4.0–10.5)
nRBC: 0 % (ref 0.0–0.2)

## 2021-06-15 LAB — BASIC METABOLIC PANEL
Anion gap: 10 (ref 5–15)
BUN: 17 mg/dL (ref 6–20)
CO2: 22 mmol/L (ref 22–32)
Calcium: 8.6 mg/dL — ABNORMAL LOW (ref 8.9–10.3)
Chloride: 107 mmol/L (ref 98–111)
Creatinine, Ser: 0.61 mg/dL (ref 0.61–1.24)
GFR, Estimated: >60 mL/min (ref >60)
Glucose, Bld: 91 mg/dL (ref 70–99)
Potassium: 3.6 mmol/L (ref 3.5–5.1)
Sodium: 139 mmol/L (ref 135–145)

## 2021-06-15 MED ORDER — AMOXICILLIN-POT CLAVULANATE 875-125 MG PO TABS
1.0000 | ORAL_TABLET | Freq: Two times a day (BID) | ORAL | Status: DC
  Administered 2021-06-15: 1 via ORAL
  Filled 2021-06-15: qty 1

## 2021-06-15 MED ORDER — OLANZAPINE 10 MG PO TABS: 5.0000 mg | ORAL_TABLET | Freq: Two times a day (BID) | ORAL | 0 refills | Status: DC

## 2021-06-15 MED ORDER — AMOXICILLIN-POT CLAVULANATE 875-125 MG PO TABS: 1.0000 | ORAL_TABLET | Freq: Two times a day (BID) | ORAL | 0 refills | Status: AC

## 2021-06-15 NOTE — Evaluation (Signed)
Physical Therapy Evaluation Patient Details Name: Richard Vasquez MRN: [id number] DOB: 07-18-1960 Today's Date: 06/15/2021  History of Present Illness  Pt admitted for accdidental overdose of antipsychotic meds. HIstory includes schnizophrenia, depression, anxiety, and recent stay in hospital secondary to [facility] needing CIR in April 2023. Cleared to participate this date with no restrictions via MD.  Clinical Impression  Pt is a pleasant 61 year old male who was admitted for accidental overdose. Pt performs bed mobility with independence, transfers with mod I, and ambulation with cga and IV pole. Pt demonstrates deficits with strength/pain/mobility. Would benefit from skilled PT to address above deficits and promote optimal return to PLOF. Currently recommending OP PT for further follow up. No further needs in acute care, safe to dc, care team aware.     Recommendations for follow up therapy are one component of a multi-disciplinary discharge planning process, led by the attending physician.  Recommendations may be updated based on patient status, additional functional criteria and insurance authorization.  Follow Up Recommendations Outpatient PT    Assistance Recommended at Discharge Intermittent Supervision/Assistance  Patient can return home with the following  A little help with walking and/or transfers;Help with stairs or ramp for entrance    Equipment Recommendations  (none)  Recommendations for Other Services       Functional Status Assessment Patient has had a recent decline in their functional status and demonstrates the ability to make significant improvements in function in a reasonable and predictable amount of time.     Precautions / Restrictions Precautions Precautions: Fall Restrictions Weight Bearing Restrictions: Yes RLE Weight Bearing: Weight bearing as tolerated LLE Weight Bearing: Weight bearing as tolerated      Mobility  Bed Mobility Overal bed mobility:  Independent             General bed mobility comments: indep with ease of effort. Once seated, upright posture noted    Transfers Overall transfer level: Modified independent Equipment used: 1 person hand held assist Transfers: Sit to/from Stand Sit to Stand: Modified independent (Device/Increase time)           General transfer comment: hand held assist and reaches for IV pole once standing for balance. Pt reports he typically uses SPC at baseline, however not in room at this time    Ambulation/Gait Ambulation/Gait assistance: Min guard Gait Distance (Feet): 200 Feet Assistive device: IV Pole Gait Pattern/deviations: Step-through pattern       General Gait Details: limping with short step length on R LE. Iv pole used with safe technique. Slow cadence  Stairs            Wheelchair Mobility    Modified Rankin (Stroke Patients Only)       Balance Overall balance assessment: Needs assistance Sitting-balance support: Feet supported Sitting balance-Leahy Scale: Good     Standing balance support: Single extremity supported Standing balance-Leahy Scale: Fair                               Pertinent Vitals/Pain Pain Assessment Pain Assessment: No/denies pain    Home Living Family/patient expects to be discharged to:: Private residence Living Arrangements: Spouse/significant other Available Help at Discharge: Friend(s);Neighbor;Available 24 hours/day Type of Home: House Home Access: Stairs to enter Entrance Stairs-Rails: Left Entrance Stairs-Number of Steps: 2   Home Layout: One level   Additional Comments: living with girlfriend. Has neighbors to assist as well    Prior Function Prior  Level of Function : Independent/Modified Independent             Mobility Comments: was using SPC prior to admission. Hasn't worn CAM boot in 3 days. ADLs Comments: indep     Hand Dominance   Dominant Hand: Right    Extremity/Trunk Assessment    Upper Extremity Assessment Upper Extremity Assessment: Overall WFL for tasks assessed    Lower Extremity Assessment Lower Extremity Assessment: Generalized weakness (L LE grossly 4/5; R LE grossly 5/5)       Communication   Communication: No difficulties  Cognition Arousal/Alertness: Awake/alert Behavior During Therapy: WFL for tasks assessed/performed, Impulsive Overall Cognitive Status: History of cognitive impairments - at baseline                                 General Comments: alert and pleasant        General Comments      Exercises     Assessment/Plan    PT Assessment All further PT needs can be met in the next venue of care  PT Problem List Decreased balance;Decreased mobility       PT Treatment Interventions      PT Goals (Current goals can be found in the Care Plan section)  Acute Rehab PT Goals Patient Stated Goal: follow up with [facility] PT Goal Formulation: With patient Time For Goal Achievement: 06/15/21 Potential to Achieve Goals: Good    Frequency       Co-evaluation               AM-PAC PT "6 Clicks" Mobility  Outcome Measure Help needed turning from your back to your side while in a flat bed without using bedrails?: None Help needed moving from lying on your back to sitting on the side of a flat bed without using bedrails?: None Help needed moving to and from a bed to a chair (including a wheelchair)?: None Help needed standing up from a chair using your arms (e.g., wheelchair or bedside chair)?: None Help needed to walk in hospital room?: None Help needed climbing 3-5 steps with a railing? : A Little 6 Click Score: 23    End of Session   Activity Tolerance: Patient tolerated treatment well Patient left: in bed (left seated at EOB) Nurse Communication: Mobility status PT Visit Diagnosis: Difficulty in walking, not elsewhere classified (R26.2);Muscle weakness (generalized) (M62.81);Unsteadiness on feet (R26.81)     Time: 7517-0017 PT Time Calculation (min) (ACUTE ONLY): 16 min   Charges:   PT Evaluation $PT Eval Low Complexity: 1 Low PT Treatments $Gait Training: 8-22 mins        Greggory Stallion, PT, DPT, GCS [phone]   Morris Markham 06/15/2021, 12:48 PM

## 2021-06-15 NOTE — TOC Transition Note (Signed)
Transition of Care [facility]) - CM/SW Discharge Note   Patient Details  Name: Richard Vasquez MRN: [id number] Date of Birth: 08/29/1960  Transition of Care [facility]) CM/SW Contact:  Candie Chroman, LCSW Phone Number: 06/15/2021, 12:04 PM   Clinical Narrative:  Patient has orders to discharge home today. PT recommending outpatient PT. Patient is agreeable and prefers [facility]. Gave him the phone number and told him to call them to set up an appointment. They would reach out to his PCP office to get the orders. No further concerns. CSW signing off.   Final next level of care: OP Rehab Barriers to Discharge: Barriers Resolved   Patient Goals and CMS Choice        Discharge Placement                Patient to be transferred to facility by: Friend Name of family member notified: Vickey Huger Patient and family notified of of transfer: 06/15/21  Discharge Plan and Services     Post Acute Care Choice: NA                               Social Determinants of Health (SDOH) Interventions     Readmission Risk Interventions    06/14/2021   11:11 AM  Readmission Risk Prevention Plan  Transportation Screening Complete  PCP or Specialist Appt within 3-5 Days Complete  Social Work Consult for [place] Planning/Counseling Complete  Palliative Care Screening Not Applicable  Medication Review Press photographer) Complete

## 2021-06-15 NOTE — Discharge Summary (Signed)
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                                                        +---------+---------------+---------+-----------+----------+--------------+ PFV      Full                                                        +---------+---------------+---------+-----------+----------+--------------+  POP      Full           Yes      Yes                                 +---------+---------------+---------+-----------+----------+--------------+ PTV      Full                                                        +---------+---------------+---------+-----------+----------+--------------+ PERO     Full                                                        +---------+---------------+---------+-----------+----------+--------------+     Summary: BILATERAL: - No evidence of deep vein thrombosis seen in the lower extremities, bilaterally. -No evidence of popliteal cyst, bilaterally.   *See table(s) above for measurements and observations. Electronically signed by Servando Snare MD on 05/22/2021 at 4:03:45 PM.    Final       Subjective: Seen and examined on day of discharge.   Stable no distress.  Stable for discharge home.  Wife at bedside.  Ambulated with physical therapy.  Discharge Exam: Vitals:   06/15/21 0744 06/15/21 1134  BP: 98/65 105/72  Pulse: 88 89  Resp: 16 16  Temp: 97.9 F (36.6 C) 98 F (36.7 C)  SpO2: 93% 99%   Vitals:   06/15/21 0123 06/15/21 0340 06/15/21 0744 06/15/21 1134  BP: 96/63 104/72 98/65 105/72  Pulse: 84 81 88 89  Resp:  '18 16 16  '$ Temp:  98.7 F (37.1 C) 97.9 F (36.6 C) 98 F (36.7 C)  TempSrc:  Oral    SpO2:  94% 93% 99%  Weight:      Height:        General: Pt is alert, awake, not in acute distress Cardiovascular: RRR, S1/S2 +, no rubs, no gallops Respiratory: CTA bilaterally, no wheezing, no rhonchi Abdominal: Soft, NT, ND, bowel sounds + Extremities: no edema, no cyanosis    The results of significant diagnostics from this hospitalization (including imaging, microbiology, ancillary and laboratory) are listed below for reference.     Microbiology: Recent Results (from the past 240 hour(s))  Culture, blood (Routine X 2) w Reflex to ID Panel     Status: None (Preliminary result)   Collection Time: 06/13/21  7:53 AM   Specimen: BLOOD LEFT HAND  Result Value Ref Range Status   Specimen Description BLOOD LEFT HAND  Final   Special Requests   Final    BOTTLES DRAWN AEROBIC AND ANAEROBIC Blood Culture adequate volume   Culture   Final    NO GROWTH 2 DAYS Performed at [facility], [address], [place], [place] [address]    Report Status PENDING  Incomplete  Culture, blood (Routine X 2) w Reflex to ID Panel     Status: None (Preliminary result)   Collection Time: 06/13/21  7:53 AM   Specimen: BLOOD  Result Value Ref Range Status   Specimen Description BLOOD LEFT ANTECUBITAL  Final   Special Requests   Final    BOTTLES  DRAWN AEROBIC AND ANAEROBIC Blood Culture adequate volume   Culture   Final    NO GROWTH 2 DAYS Performed at [facility], [address] [place], [place]  [address]    Report Status PENDING  Incomplete  MRSA Next Gen by PCR, Nasal     Status: None   Collection Time: 06/13/21  9:59 AM  Result Value Ref Range Status   MRSA by PCR Next Gen NOT DETECTED NOT DETECTED Final    Comment: (NOTE) The GeneXpert MRSA Assay (FDA approved for NASAL specimens only), is one component of a comprehensive MRSA colonization surveillance program. It is not intended to diagnose MRSA infection nor to guide or monitor treatment for MRSA infections. Test performance is not FDA approved in patients less than 22 years old. Performed at [facility], [address] [place], [place] [address]      Labs: BNP (last 3 results) No results for input(s): BNP in the last 8760 hours. Basic Metabolic Panel: Recent Labs  Lab 06/11/21 1930 06/12/21 2355 06/13/21 0753 06/14/21 0341 06/15/21 0818  NA 142 135 139 138 139  K 3.7 3.5 3.5 3.4* 3.6  CL 108 105 107 109 107  CO2 '23 22 23 23 22  '$ GLUCOSE 141* 103* 103* 97 91  BUN 8 25* 21* 14 17  CREATININE 0.66 0.92 0.75 0.73 0.61  CALCIUM 9.3 8.5* 9.1 8.4* 8.6*  MG 2.2 1.9 2.2 1.9  --   PHOS  --   --   --  3.1  --    Liver Function Tests: Recent Labs  Lab 06/11/21 1930 06/12/21 2355 06/14/21 0341  AST 15 13*  --   ALT 9 9  --   ALKPHOS 134* 104  --   BILITOT 0.6 0.3  --   PROT 7.5 6.3*  --   ALBUMIN 3.7 3.0* 3.0*   No results for input(s): LIPASE, AMYLASE in the last 168 hours. No results for input(s): AMMONIA in the last 168 hours. CBC: Recent Labs  Lab 06/11/21 1930 06/12/21 2355 06/13/21 0753 06/14/21 0341 06/15/21 0818  WBC 9.9 15.8* 19.9* 13.3* 9.0  NEUTROABS 6.7  --   --   --  6.7  HGB 10.2* 9.1* 9.6* 9.5* 8.9*  HCT 32.3* 28.4* 29.6* 29.8* 28.2*  MCV 94.7 91.6 91.9 92.3 92.2  PLT 252 206 225 185 196   Cardiac Enzymes: No results for input(s): CKTOTAL, CKMB, CKMBINDEX, TROPONINI in the last 168 hours. BNP: Invalid input(s): POCBNP CBG: Recent Labs  Lab 06/13/21 0126  GLUCAP 96    D-Dimer No results for input(s): DDIMER in the last 72 hours. Hgb A1c No results for input(s): HGBA1C in the last 72 hours. Lipid Profile No results for input(s): CHOL, HDL, LDLCALC, TRIG, CHOLHDL, LDLDIRECT in the last 72 hours. Thyroid function studies No results for input(s): TSH, T4TOTAL, T3FREE, THYROIDAB in the last 72 hours.  Invalid input(s): FREET3 Anemia work up No results for input(s): VITAMINB12, FOLATE, FERRITIN, TIBC, IRON, RETICCTPCT in the last 72 hours. Urinalysis    Component Value Date/Time   COLORURINE STRAW (A) 06/13/2021 0644   APPEARANCEUR CLEAR (A) 06/13/2021 0644   LABSPEC 1.004 (L) 06/13/2021 0644   PHURINE 7.0 06/13/2021 0644   GLUCOSEU NEGATIVE 06/13/2021 0644   HGBUR SMALL (A) 06/13/2021 0644   BILIRUBINUR NEGATIVE 06/13/2021 0644   BILIRUBINUR negative 03/17/2020 1534   [place] 06/13/2021 0644   PROTEINUR NEGATIVE 06/13/2021 0644   UROBILINOGEN 0.2 03/17/2020 1534   NITRITE NEGATIVE 06/13/2021  [place] (A) 06/13/2021 0644   Sepsis Labs Invalid input(s): PROCALCITONIN,  WBC,  LACTICIDVEN Microbiology Recent Results (from the past 240 hour(s))  Culture, blood (Routine X 2) w Reflex to ID Panel     Status: None (Preliminary result)   Collection Time: 06/13/21  7:53 AM   Specimen: BLOOD LEFT HAND  Result Value Ref Range Status   Specimen Description BLOOD LEFT HAND  Final   Special Requests   Final    BOTTLES DRAWN AEROBIC AND ANAEROBIC Blood Culture adequate volume   Culture   Final    NO GROWTH 2 DAYS Performed at [facility], [address], [place], [place] [address]    Report Status PENDING  Incomplete  Culture, blood (Routine X 2) w Reflex to ID Panel     Status: None (Preliminary result)   Collection Time: 06/13/21  7:53 AM   Specimen: BLOOD  Result Value Ref Range Status   Specimen Description BLOOD LEFT ANTECUBITAL  Final   Special Requests   Final    BOTTLES DRAWN AEROBIC AND ANAEROBIC  Blood Culture adequate volume   Culture   Final    NO GROWTH 2 DAYS Performed at [facility], [address] [place], [place] [address]    Report Status PENDING  Incomplete  MRSA Next Gen by PCR, Nasal     Status: None   Collection Time: 06/13/21  9:59 AM  Result Value Ref Range Status   MRSA by PCR Next Gen NOT DETECTED NOT DETECTED Final    Comment: (NOTE) The GeneXpert MRSA Assay (FDA approved for NASAL specimens only), is one component of a comprehensive MRSA colonization surveillance program. It is not intended to diagnose MRSA infection nor to guide or monitor treatment for MRSA infections. Test performance is not FDA approved in patients less than 30 years old. Performed at [facility], [address], [place], [place] [address]      Time coordinating discharge: Over 30 minutes  SIGNED:   Sidney Ace, MD  [facility] 06/15/2021, 11:52 AM Pager   If 7PM-7AM, please contact night-coverage

## 2021-06-15 NOTE — Progress Notes (Signed)
Patient belongings not found- called ER and ICU and belongings not found. Per patient missing clothing and cane. I provided patient and his fiance Kieth Brightly patient experience phone number [phone])

## 2021-06-18 LAB — CULTURE, BLOOD (ROUTINE X 2)
Culture: NO GROWTH
Culture: NO GROWTH
Special Requests: ADEQUATE
Special Requests: ADEQUATE

## 2021-06-19 ENCOUNTER — Telehealth: Payer: Self-pay | Admitting: Family Medicine
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
## 2021-06-19 NOTE — Telephone Encounter (Signed)
I called and spoke with the patient and informed him when he has the appointment on Friday with the physical rehabilitation to ask them to set up the PT at [place].  Calee Nugent,cma

## 2021-06-19 NOTE — Telephone Encounter (Signed)
Patient stated it was his left leg, he needed PT at [facility] and he needs a referral.  Tiphani Mells,cma

## 2021-06-19 NOTE — Telephone Encounter (Signed)
He needs a hospital follow-up appointment from his recent hospitalizations. It appears he had surgery on his leg. Has he followed up with orthopedics? They would typically determine when the patient would start PT.

## 2021-06-21 ENCOUNTER — Ambulatory Visit (INDEPENDENT_AMBULATORY_CARE_PROVIDER_SITE_OTHER): Payer: Medicaid Other | Admitting: Gastroenterology

## 2021-06-21 ENCOUNTER — Encounter: Payer: Self-pay | Admitting: Gastroenterology

## 2021-06-21 VITALS — BP 117/78 | HR 111 | Temp 97.9°F | Wt 174.0 lb

## 2021-06-21 DIAGNOSIS — R1013 Epigastric pain: Secondary | ICD-10-CM

## 2021-06-21 NOTE — Progress Notes (Signed)
Primary Care Physician: Leone Haven, MD  Primary Gastroenterologist:  Dr. [name]  Chief Complaint  Patient presents with   Gastroesophageal Reflux   Diarrhea    HPI: Richard Vasquez is a 61 y.o. male here with indigestion and a history of having a colonoscopy for constipation by me.  The patient now reports that he has been having diarrhea with a lot of gas.  He does drink some milk with his coffee and also has cheese but denies any ice cream.  The patient reports that he only has 1 soft bowel movement a day and otherwise he has no unexplained weight loss rectal bleeding nausea or vomiting.  The patient denies that the diarrhea wakes him up from sleep.   He reports that he has indigestion that happens usually at the evening time.  He takes his pantoprazole in the morning and usually takes famotidine in the evening.  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     albuterol (VENTOLIN HFA) 108 (90 Base) MCG/ACT inhaler Inhale 1-2 puffs into the lungs every 6 (six) hours as needed for wheezing or shortness of breath. 18 g 0   Ascorbic Acid (VITAMIN C PO) Take 1 capsule by mouth daily.     diclofenac Sodium (VOLTAREN) 1 % GEL Apply 2 g topically 4 (four) times daily. 100 g 0   ferrous sulfate 325 (65 FE) MG  tablet Take 1 tablet (325 mg total) by mouth 2 (two) times daily with a meal. 60 tablet 0   gabapentin (NEURONTIN) 100 MG capsule Take 1 capsule (100 mg total) by mouth 3 (three) times daily. 90 capsule 0   hydrochlorothiazide (HYDRODIURIL) 12.5 MG tablet Take 1 tablet (12.5 mg total) by mouth daily. 30 tablet 0   methocarbamol (ROBAXIN) 750 MG tablet Take 1 tablet (750 mg total) by mouth 4 (four) times daily. 120 tablet 0   OLANZapine (ZYPREXA) 10 MG tablet Take '10mg'$  by mouth once daily 30 tablet 0   Omega-3 Fatty Acids (FISH OIL PO) Take 1 capsule by mouth daily.     Oxycodone HCl 10 MG TABS Take 1 tablet (10 mg total) by mouth every 4 (four) hours as needed for severe pain. 30 tablet 0   pantoprazole (PROTONIX) 40 MG tablet Take 1 tablet (40 mg total) by mouth daily. 30 tablet 0   polyethylene glycol (MIRALAX / GLYCOLAX) 17 g packet Take 17 g by mouth daily. 14 each 0   rosuvastatin (CRESTOR) 10 MG tablet Take 1 tablet (10 mg total) by mouth daily. 30 tablet 0   Tiotropium Bromide Monohydrate (SPIRIVA RESPIMAT) 2.5 MCG/ACT AERS Inhale 2 puffs into the lungs daily. 4 g 2   traZODone (DESYREL) 100 MG tablet Take 1 tablet (100  mg total) by mouth at bedtime. 30 tablet 0   Vitamin D, Ergocalciferol, (DRISDOL) 1.25 MG (50000 UNIT) CAPS capsule Take 1 capsule (50,000 Units total) by mouth once a week. 5 capsule 0   No current facility-administered medications for this visit.    Allergies as of 06/21/2021   (No Known Allergies)    ROS:  General: Negative for anorexia, weight loss, fever, chills, fatigue, weakness. ENT: Negative for hoarseness, difficulty swallowing , nasal congestion. CV: Negative for chest pain, angina, palpitations, dyspnea on exertion, peripheral edema.  Respiratory: Negative for dyspnea at rest, dyspnea on exertion, cough, sputum, wheezing.  GI: See history of present illness. GU:  Negative for dysuria, hematuria, urinary incontinence, urinary frequency, nocturnal  urination.  Endo: Negative for unusual weight change.    Physical Examination:   BP 117/78   Pulse (!) 111   Temp 97.9 F (36.6 C) (Oral)   Wt 174 lb (78.9 kg)   BMI 24.97 kg/m   General: Well-nourished, well-developed in no acute distress.  Eyes: No icterus. Conjunctivae pink. Neuro: Alert and oriented x 3.  Grossly intact. Skin: Warm and dry, no jaundice.   Psych: Alert and cooperative, normal mood and affect.  Labs:    Imaging Studies: CT HEAD WO CONTRAST (5MM)  Result Date: 06/11/2021 CLINICAL DATA:  Mental status change, unknown cause EXAM: CT HEAD WITHOUT CONTRAST TECHNIQUE: Contiguous axial images were obtained from the base of the skull through the vertex without intravenous contrast. RADIATION DOSE REDUCTION: This exam was performed according to the departmental dose-optimization program which includes automated exposure control, adjustment of the mA and/or kV according to patient size and/or use of iterative reconstruction technique. COMPARISON:  05/17/2021 FINDINGS: Brain: No acute intracranial abnormality. Specifically, no hemorrhage, hydrocephalus, mass lesion, acute infarction, or significant intracranial injury. Vascular: No hyperdense vessel or unexpected calcification. Skull: No acute calvarial abnormality. Sinuses/Orbits: Mucosal thickening within the paranasal sinuses. Air-fluid level in the maxillary sinuses. Other: None IMPRESSION: No acute intracranial abnormality. Acute on chronic sinusitis. Electronically Signed   By: Rolm Baptise M.D.   On: 06/11/2021 23:23   US Venous Img Lower Bilateral (DVT)  Result Date: 06/12/2021 CLINICAL DATA:  Leg pain EXAM: BILATERAL LOWER EXTREMITY VENOUS DOPPLER ULTRASOUND TECHNIQUE: Gray-scale sonography with compression, as well as color and duplex ultrasound, were performed to evaluate the deep venous system(s) from the level of the common femoral vein through the popliteal and proximal calf veins. COMPARISON:  None Available.  FINDINGS: VENOUS Normal compressibility of the common femoral, superficial femoral, and popliteal veins, as well as the visualized calf veins. Visualized portions of profunda femoral vein and great saphenous vein unremarkable. No filling defects to suggest DVT on grayscale or color Doppler imaging. Doppler waveforms show normal direction of venous flow, normal respiratory plasticity and response to augmentation. OTHER Area of swelling appears to correspond to varicose veins. Limitations: none IMPRESSION: No evidence of lower extremity DVT on either side. Area of swelling appears to correspond to patent varicose veins. Electronically Signed   By: Macy Mis M.D.   On: 06/12/2021 12:14   DG Chest Port 1 View  Result Date: 06/13/2021 CLINICAL DATA:  Fevers EXAM: PORTABLE CHEST 1 VIEW COMPARISON:  05/17/2021 FINDINGS: Cardiac shadow is within normal limits. The lungs are well aerated bilaterally. No focal infiltrate or sizable effusion is seen. Apical pleural and parenchymal scarring is again seen. Old healed rib fractures on the left are noted. IMPRESSION: No acute abnormality noted Electronically Signed   By: Inez Catalina  M.D.   On: 06/13/2021 03:22   VAS [place] LOWER EXTREMITY VENOUS (DVT)  Result Date: 05/22/2021  Lower Venous DVT Study Patient Name:  VONG GARRINGER  Date of Exam:   05/22/2021 Medical Rec #: [id number]        Accession #:    [id number] Date of Birth: 04-29-1960        Patient Gender: M Patient Age:   15 years Exam Location[facility]  [facility] Procedure:      VAS [place] LOWER EXTREMITY VENOUS (DVT) Referring Phys: Lauraine Rinne --------------------------------------------------------------------------------  Indications: Swelling, and Edema.  Comparison Study: 06/13/18 prior Performing Technologist: Archie Patten RVS  Examination Guidelines: A complete evaluation includes B-mode imaging, spectral Doppler, color Doppler, and power Doppler as needed of all accessible portions of each vessel.  Bilateral testing is considered an integral part of a complete examination. Limited examinations for reoccurring indications may be performed as noted. The reflux portion of the exam is performed with the patient in reverse Trendelenburg.  +---------+---------------+---------+-----------+----------+--------------+ RIGHT    CompressibilityPhasicitySpontaneityPropertiesThrombus Aging +---------+---------------+---------+-----------+----------+--------------+ CFV      Full           Yes      Yes                                 +---------+---------------+---------+-----------+----------+--------------+ SFJ      Full                                                        +---------+---------------+---------+-----------+----------+--------------+ FV Prox  Full                                                        +---------+---------------+---------+-----------+----------+--------------+ FV Mid   Full                                                        +---------+---------------+---------+-----------+----------+--------------+ FV DistalFull                                                        +---------+---------------+---------+-----------+----------+--------------+ PFV      Full                                                        +---------+---------------+---------+-----------+----------+--------------+ POP      Full           Yes      Yes                                 +---------+---------------+---------+-----------+----------+--------------+  PTV      Full                                                        +---------+---------------+---------+-----------+----------+--------------+ PERO     Full                                                        +---------+---------------+---------+-----------+----------+--------------+   +---------+---------------+---------+-----------+----------+--------------+ LEFT      CompressibilityPhasicitySpontaneityPropertiesThrombus Aging +---------+---------------+---------+-----------+----------+--------------+ CFV      Full           Yes      Yes                                 +---------+---------------+---------+-----------+----------+--------------+ SFJ      Full                                                        +---------+---------------+---------+-----------+----------+--------------+ FV Prox  Full                                                        +---------+---------------+---------+-----------+----------+--------------+ FV Mid   Full                                                        +---------+---------------+---------+-----------+----------+--------------+ FV DistalFull                                                        +---------+---------------+---------+-----------+----------+--------------+ PFV      Full                                                        +---------+---------------+---------+-----------+----------+--------------+ POP      Full           Yes      Yes                                 +---------+---------------+---------+-----------+----------+--------------+ PTV      Full                                                        +---------+---------------+---------+-----------+----------+--------------+  PERO     Full                                                        +---------+---------------+---------+-----------+----------+--------------+     Summary: BILATERAL: - No evidence of deep vein thrombosis seen in the lower extremities, bilaterally. -No evidence of popliteal cyst, bilaterally.   *See table(s) above for measurements and observations. Electronically signed by Servando Snare MD on 05/22/2021 at 4:03:45 PM.    Final     Assessment and Plan:   WHIT BRUNI is a 61 y.o. y/o male who comes in with a history of constipation with a colonoscopy in not showing any  cause for the constipation.  The patient now reports that his been having diarrhea but is usually once a day associated with gas.  The patient has been told to avoid dairy products.  The patient has also been told to take his pantoprazole in the evening since most of his dyspepsia and symptoms happen in the evening.  The patient has also been told that he can add fiber to his diet to help with his loose bowel movements.  The patient has been explained the plan and agrees with it.     [name], MD. [facility]    Note: This dictation was prepared with Dragon dictation along with smaller phrase technology. Any transcriptional errors that result from this process are unintentional.

## 2021-06-22 ENCOUNTER — Telehealth: Payer: Self-pay | Admitting: *Deleted

## 2021-06-22 ENCOUNTER — Encounter: Payer: Self-pay | Admitting: Physical Medicine & Rehabilitation

## 2021-06-22 ENCOUNTER — Encounter (HOSPITAL_BASED_OUTPATIENT_CLINIC_OR_DEPARTMENT_OTHER): Payer: Medicaid Other | Admitting: Physical Medicine & Rehabilitation

## 2021-06-22 VITALS — BP 113/69 | HR 93 | Ht 70.0 in | Wt 155.4 lb

## 2021-06-22 DIAGNOSIS — S069X0A Unspecified intracranial injury without loss of consciousness, initial encounter: Secondary | ICD-10-CM

## 2021-06-22 DIAGNOSIS — S8292XS Unspecified fracture of left lower leg, sequela: Secondary | ICD-10-CM

## 2021-06-22 DIAGNOSIS — M79605 Pain in left leg: Secondary | ICD-10-CM | POA: Diagnosis not present

## 2021-06-22 DIAGNOSIS — M25562 Pain in left knee: Secondary | ICD-10-CM | POA: Diagnosis not present

## 2021-06-22 MED ORDER — OXYCODONE HCL 5 MG PO TABS: 5.0000 mg | ORAL_TABLET | Freq: Four times a day (QID) | ORAL | 0 refills | Status: DC | PRN

## 2021-06-22 MED ORDER — OXYCODONE HCL 5 MG PO TABS: 5.0000 mg | ORAL_TABLET | Freq: Three times a day (TID) | ORAL | 0 refills | Status: DC | PRN

## 2021-06-22 NOTE — Telephone Encounter (Signed)
Prior auth for oxycodone submitted to medicaid via [place]. Confirmation #:[id number] W

## 2021-06-22 NOTE — Telephone Encounter (Signed)
Approved 06/22/21-12/19/21
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
## 2021-06-24 ENCOUNTER — Encounter: Payer: Self-pay | Admitting: Physical Medicine & Rehabilitation

## 2021-07-02 ENCOUNTER — Ambulatory Visit: Payer: Medicaid Other

## 2021-07-02 ENCOUNTER — Encounter: Payer: Self-pay | Admitting: Speech Pathology

## 2021-07-02 ENCOUNTER — Encounter: Payer: Self-pay | Admitting: Occupational Therapy

## 2021-07-02 ENCOUNTER — Ambulatory Visit: Payer: Medicaid Other | Admitting: Occupational Therapy

## 2021-07-02 ENCOUNTER — Ambulatory Visit: Payer: Medicaid Other | Attending: Physical Medicine & Rehabilitation | Admitting: Speech Pathology

## 2021-07-02 DIAGNOSIS — R262 Difficulty in walking, not elsewhere classified: Secondary | ICD-10-CM

## 2021-07-02 DIAGNOSIS — S069X0A Unspecified intracranial injury without loss of consciousness, initial encounter: Secondary | ICD-10-CM

## 2021-07-02 DIAGNOSIS — R278 Other lack of coordination: Secondary | ICD-10-CM | POA: Insufficient documentation

## 2021-07-02 DIAGNOSIS — G8929 Other chronic pain: Secondary | ICD-10-CM | POA: Diagnosis present

## 2021-07-02 DIAGNOSIS — M6281 Muscle weakness (generalized): Secondary | ICD-10-CM

## 2021-07-02 DIAGNOSIS — M79605 Pain in left leg: Secondary | ICD-10-CM | POA: Diagnosis present

## 2021-07-02 DIAGNOSIS — R41841 Cognitive communication deficit: Secondary | ICD-10-CM | POA: Insufficient documentation

## 2021-07-02 DIAGNOSIS — S8292XS Unspecified fracture of left lower leg, sequela: Secondary | ICD-10-CM

## 2021-07-02 DIAGNOSIS — R2681 Unsteadiness on feet: Secondary | ICD-10-CM
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
## 2021-07-02 NOTE — Patient Instructions (Signed)
When you are taking a phone call, write down the details to help you remember. If you need time to look for a paper and pencil, ask the person to give you a minute. Don't be afraid to ask the other person to say something again if you miss something. At the end of the phone call, confirm the details you wrote down by reading them back to the person and asking if the information is correct.   Memory Compensation Strategies  Use "WARM" strategy. W= write it down A=  associate it R=  repeat it M=  make a mental picture  You can keep a Memory Notebook. Use a 3-ring notebook with sections for the following:  calendar, important names and phone numbers, medications, doctors' names/phone numbers, "to do list"/reminders, and a section to journal what you did each day  Use a calendar to write appointments down. You can also write in reminders to do your exercises.  Write yourself a schedule for the day.  This can be placed on the calendar or in a separate section of the Memory Notebook.  Keeping a regular schedule can help memory.  Use medication organizer with sections for each day or morning/evening pills  You may need help loading it  Keep a basket, or pegboard by the door.   Place items that you need to take out with you in the basket or on the pegboard.  You may also want to include a message board for reminders.  Use sticky notes. Place sticky notes with reminders in a place where the task is performed.  For example:  "turn off the stove" placed by the stove, "lock the door" placed on the door at eye level, "take your medications" on the bathroom mirror or by the place where you normally take your medications  Use alarms, timers, and/or a reminder app. Use while cooking to remind yourself to check on food or as a reminder to take your medicine, or as a reminder to make a call, or as a reminder to perform another task, etc.  Use a voice recorder app or small tape recorder to record  important information and notes for yourself. Go back at the end of the day and listen to these.
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
## 2021-07-09 ENCOUNTER — Encounter: Payer: Medicaid Other | Admitting: Speech Pathology

## 2021-07-09 ENCOUNTER — Ambulatory Visit: Payer: Medicaid Other

## 2021-07-09 ENCOUNTER — Encounter: Payer: Medicaid Other | Admitting: Occupational Therapy

## 2021-07-11 ENCOUNTER — Ambulatory Visit: Payer: Medicaid Other

## 2021-07-12 ENCOUNTER — Ambulatory Visit: Payer: Medicaid Other

## 2021-07-12 ENCOUNTER — Ambulatory Visit: Payer: Medicaid Other | Admitting: Physical Therapy

## 2021-07-17 ENCOUNTER — Ambulatory Visit: Payer: Medicaid Other

## 2021-07-19 ENCOUNTER — Encounter: Payer: Medicaid Other | Admitting: Occupational Therapy

## 2021-07-19 ENCOUNTER — Ambulatory Visit: Payer: Medicaid Other

## 2021-07-19 ENCOUNTER — Encounter: Payer: Medicaid Other | Admitting: Speech Pathology

## 2021-07-24 ENCOUNTER — Encounter: Payer: Medicaid Other | Admitting: Occupational Therapy

## 2021-07-24 ENCOUNTER — Encounter: Payer: Medicaid Other | Admitting: Speech Pathology

## 2021-07-25 ENCOUNTER — Ambulatory Visit: Payer: Medicaid Other

## 2021-07-26 ENCOUNTER — Ambulatory Visit: Payer: Medicaid Other

## 2021-07-26 DIAGNOSIS — R262 Difficulty in walking, not elsewhere classified: Secondary | ICD-10-CM

## 2021-07-26 DIAGNOSIS — M6281 Muscle weakness (generalized): Secondary | ICD-10-CM

## 2021-07-26 DIAGNOSIS — R2681 Unsteadiness on feet: Secondary | ICD-10-CM

## 2021-07-26 DIAGNOSIS — R41841 Cognitive communication deficit: Secondary | ICD-10-CM | POA: Diagnosis not present

## 2021-07-26 DIAGNOSIS — G8929 Other chronic pain: Secondary | ICD-10-CM
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
## 2021-07-27 ENCOUNTER — Ambulatory Visit: Payer: Medicaid Other

## 2021-08-02 ENCOUNTER — Encounter: Payer: Medicaid Other | Admitting: Speech Pathology

## 2021-08-02 ENCOUNTER — Encounter: Payer: Medicaid Other | Admitting: Occupational Therapy

## 2021-08-02 ENCOUNTER — Ambulatory Visit: Payer: Medicaid Other | Attending: Internal Medicine

## 2021-08-02 DIAGNOSIS — R278 Other lack of coordination: Secondary | ICD-10-CM | POA: Insufficient documentation

## 2021-08-02 DIAGNOSIS — R262 Difficulty in walking, not elsewhere classified: Secondary | ICD-10-CM | POA: Insufficient documentation

## 2021-08-02 DIAGNOSIS — G8929 Other chronic pain: Secondary | ICD-10-CM | POA: Insufficient documentation

## 2021-08-02 DIAGNOSIS — M6281 Muscle weakness (generalized): Secondary | ICD-10-CM | POA: Insufficient documentation

## 2021-08-02 DIAGNOSIS — M79605 Pain in left leg: Secondary | ICD-10-CM | POA: Insufficient documentation

## 2021-08-02 DIAGNOSIS — R2681 Unsteadiness on feet: Secondary | ICD-10-CM | POA: Insufficient documentation

## 2021-08-07 ENCOUNTER — Encounter: Payer: Medicaid Other | Admitting: Occupational Therapy

## 2021-08-07 ENCOUNTER — Ambulatory Visit: Payer: Medicaid Other

## 2021-08-07 ENCOUNTER — Encounter: Payer: Medicaid Other | Admitting: Speech Pathology

## 2021-08-07 DIAGNOSIS — R262 Difficulty in walking, not elsewhere classified: Secondary | ICD-10-CM

## 2021-08-07 DIAGNOSIS — M79605 Pain in left leg: Secondary | ICD-10-CM | POA: Diagnosis present

## 2021-08-07 DIAGNOSIS — R278 Other lack of coordination: Secondary | ICD-10-CM

## 2021-08-07 DIAGNOSIS — R2681 Unsteadiness on feet: Secondary | ICD-10-CM | POA: Diagnosis present

## 2021-08-07 DIAGNOSIS — G8929 Other chronic pain: Secondary | ICD-10-CM

## 2021-08-07 DIAGNOSIS — M6281 Muscle weakness (generalized): Secondary | ICD-10-CM | POA: Diagnosis present
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
## 2021-08-09 ENCOUNTER — Encounter: Payer: Self-pay | Admitting: Physical Medicine & Rehabilitation

## 2021-08-09 ENCOUNTER — Ambulatory Visit: Payer: Medicaid Other

## 2021-08-09 DIAGNOSIS — R262 Difficulty in walking, not elsewhere classified: Secondary | ICD-10-CM

## 2021-08-09 DIAGNOSIS — R278 Other lack of coordination: Secondary | ICD-10-CM

## 2021-08-09 DIAGNOSIS — G8929 Other chronic pain: Secondary | ICD-10-CM

## 2021-08-09 DIAGNOSIS — M6281 Muscle weakness (generalized): Secondary | ICD-10-CM

## 2021-08-09 DIAGNOSIS — R2681 Unsteadiness on feet: Secondary | ICD-10-CM
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
## 2021-08-13 ENCOUNTER — Encounter: Payer: Medicaid Other | Admitting: Occupational Therapy

## 2021-08-13 ENCOUNTER — Encounter: Payer: Medicaid Other | Admitting: Speech Pathology

## 2021-08-13 ENCOUNTER — Ambulatory Visit: Payer: Medicaid Other

## 2021-08-14 ENCOUNTER — Ambulatory Visit: Payer: Medicaid Other

## 2021-08-14 DIAGNOSIS — M6281 Muscle weakness (generalized): Secondary | ICD-10-CM | POA: Diagnosis not present

## 2021-08-14 DIAGNOSIS — R278 Other lack of coordination: Secondary | ICD-10-CM

## 2021-08-14 DIAGNOSIS — G8929 Other chronic pain: Secondary | ICD-10-CM

## 2021-08-14 DIAGNOSIS — R262 Difficulty in walking, not elsewhere classified: Secondary | ICD-10-CM

## 2021-08-14 DIAGNOSIS — R2681 Unsteadiness on feet: Secondary | ICD-10-CM
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
## 2021-08-16 ENCOUNTER — Ambulatory Visit: Payer: Medicaid Other

## 2021-08-16 DIAGNOSIS — M6281 Muscle weakness (generalized): Secondary | ICD-10-CM | POA: Diagnosis not present

## 2021-08-16 DIAGNOSIS — R262 Difficulty in walking, not elsewhere classified: Secondary | ICD-10-CM

## 2021-08-16 DIAGNOSIS — R2681 Unsteadiness on feet: Secondary | ICD-10-CM

## 2021-08-16 DIAGNOSIS — G8929 Other chronic pain: Secondary | ICD-10-CM
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
## 2021-08-21 ENCOUNTER — Encounter: Payer: Medicaid Other | Admitting: Speech Pathology

## 2021-08-21 ENCOUNTER — Encounter: Payer: Medicaid Other | Admitting: Occupational Therapy

## 2021-08-21 ENCOUNTER — Ambulatory Visit: Payer: Medicaid Other

## 2021-08-21 DIAGNOSIS — G8929 Other chronic pain: Secondary | ICD-10-CM

## 2021-08-21 DIAGNOSIS — M6281 Muscle weakness (generalized): Secondary | ICD-10-CM | POA: Diagnosis not present

## 2021-08-21 DIAGNOSIS — R262 Difficulty in walking, not elsewhere classified: Secondary | ICD-10-CM
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
## 2021-08-23 ENCOUNTER — Ambulatory Visit: Payer: Medicaid Other

## 2021-08-23 DIAGNOSIS — M6281 Muscle weakness (generalized): Secondary | ICD-10-CM | POA: Diagnosis not present

## 2021-08-23 DIAGNOSIS — R262 Difficulty in walking, not elsewhere classified: Secondary | ICD-10-CM

## 2021-08-23 DIAGNOSIS — G8929 Other chronic pain: Secondary | ICD-10-CM
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
## 2021-08-24 ENCOUNTER — Ambulatory Visit: Payer: Medicaid Other

## 2021-08-28 ENCOUNTER — Encounter: Payer: Medicaid Other | Admitting: Occupational Therapy

## 2021-08-28 ENCOUNTER — Ambulatory Visit: Payer: Medicaid Other

## 2021-08-28 ENCOUNTER — Encounter: Payer: Medicaid Other | Admitting: Speech Pathology

## 2021-08-30 ENCOUNTER — Ambulatory Visit: Payer: Medicaid Other | Admitting: Physical Therapy

## 2021-08-31 ENCOUNTER — Ambulatory Visit: Payer: Medicaid Other

## 2021-09-04 ENCOUNTER — Encounter: Payer: Medicaid Other | Admitting: Speech Pathology

## 2021-09-04 ENCOUNTER — Ambulatory Visit: Payer: Medicaid Other | Admitting: Physical Therapy

## 2021-09-04 ENCOUNTER — Encounter: Payer: Medicaid Other | Admitting: Occupational Therapy

## 2021-09-06 ENCOUNTER — Ambulatory Visit: Payer: Medicaid Other

## 2021-09-07 ENCOUNTER — Ambulatory Visit: Payer: Medicaid Other

## 2021-09-11 ENCOUNTER — Encounter: Payer: Medicaid Other | Admitting: Speech Pathology

## 2021-09-11 ENCOUNTER — Encounter: Payer: Medicaid Other | Admitting: Occupational Therapy

## 2021-09-11 ENCOUNTER — Ambulatory Visit: Payer: Medicaid Other

## 2021-09-13 ENCOUNTER — Ambulatory Visit: Payer: Medicaid Other

## 2021-09-14 ENCOUNTER — Ambulatory Visit: Payer: Medicaid Other

## 2021-09-18 ENCOUNTER — Encounter: Payer: Medicaid Other | Admitting: Speech Pathology

## 2021-09-18 ENCOUNTER — Ambulatory Visit: Payer: Medicaid Other | Admitting: Physical Therapy

## 2021-09-18 ENCOUNTER — Encounter: Payer: Medicaid Other | Admitting: Occupational Therapy

## 2021-09-20 ENCOUNTER — Ambulatory Visit: Payer: Medicaid Other | Attending: Physical Medicine & Rehabilitation

## 2021-09-20 DIAGNOSIS — R278 Other lack of coordination: Secondary | ICD-10-CM | POA: Diagnosis present

## 2021-09-20 DIAGNOSIS — S069X0A Unspecified intracranial injury without loss of consciousness, initial encounter: Secondary | ICD-10-CM | POA: Insufficient documentation

## 2021-09-20 DIAGNOSIS — G8929 Other chronic pain: Secondary | ICD-10-CM | POA: Insufficient documentation

## 2021-09-20 DIAGNOSIS — R2681 Unsteadiness on feet: Secondary | ICD-10-CM | POA: Insufficient documentation

## 2021-09-20 DIAGNOSIS — R41841 Cognitive communication deficit: Secondary | ICD-10-CM | POA: Insufficient documentation

## 2021-09-20 DIAGNOSIS — M6281 Muscle weakness (generalized): Secondary | ICD-10-CM | POA: Insufficient documentation

## 2021-09-20 DIAGNOSIS — M79605 Pain in left leg: Secondary | ICD-10-CM | POA: Insufficient documentation

## 2021-09-20 DIAGNOSIS — R262 Difficulty in walking, not elsewhere classified: Secondary | ICD-10-CM | POA: Insufficient documentation
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
## 2021-09-21 ENCOUNTER — Ambulatory Visit: Payer: Medicaid Other

## 2021-09-25 ENCOUNTER — Encounter: Payer: Medicaid Other | Admitting: Speech Pathology

## 2021-09-25 ENCOUNTER — Encounter: Payer: Medicaid Other | Admitting: Occupational Therapy

## 2021-09-25 ENCOUNTER — Ambulatory Visit: Payer: Medicaid Other | Admitting: Physical Therapy

## 2021-09-27 ENCOUNTER — Encounter: Payer: Self-pay | Admitting: Physical Therapy

## 2021-09-27 ENCOUNTER — Ambulatory Visit: Payer: Medicaid Other | Admitting: Physical Therapy

## 2021-09-27 ENCOUNTER — Ambulatory Visit: Payer: Medicaid Other | Admitting: Physical Medicine & Rehabilitation

## 2021-09-27 DIAGNOSIS — G8929 Other chronic pain: Secondary | ICD-10-CM

## 2021-09-27 DIAGNOSIS — M6281 Muscle weakness (generalized): Secondary | ICD-10-CM

## 2021-09-27 DIAGNOSIS — R262 Difficulty in walking, not elsewhere classified: Secondary | ICD-10-CM
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
## 2021-09-28 ENCOUNTER — Ambulatory Visit: Payer: Medicaid Other

## 2021-10-02 ENCOUNTER — Ambulatory Visit: Payer: Medicaid Other | Admitting: Physical Therapy

## 2021-10-03 ENCOUNTER — Ambulatory Visit: Payer: Medicaid Other

## 2021-10-04 ENCOUNTER — Ambulatory Visit: Payer: Medicaid Other | Admitting: Physical Therapy

## 2021-10-09 ENCOUNTER — Ambulatory Visit: Payer: Medicaid Other | Admitting: Physical Therapy

## 2021-10-11 ENCOUNTER — Ambulatory Visit: Payer: Medicaid Other | Attending: Physical Medicine & Rehabilitation

## 2021-10-11 DIAGNOSIS — M79605 Pain in left leg: Secondary | ICD-10-CM | POA: Diagnosis present

## 2021-10-11 DIAGNOSIS — R262 Difficulty in walking, not elsewhere classified: Secondary | ICD-10-CM | POA: Diagnosis present

## 2021-10-11 DIAGNOSIS — M6281 Muscle weakness (generalized): Secondary | ICD-10-CM | POA: Insufficient documentation

## 2021-10-11 DIAGNOSIS — G8929 Other chronic pain: Secondary | ICD-10-CM | POA: Insufficient documentation
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
## 2021-10-12 NOTE — Addendum Note (Signed)
Addended by: Jennye Boroughs on: 10/12/2021 06:40 PM   Modules accepted: Orders

## 2021-10-12 NOTE — Addendum Note (Signed)
Addended by: Jennye Boroughs on: 10/12/2021 06:36 PM   Modules accepted: Orders

## 2021-10-16 ENCOUNTER — Ambulatory Visit: Payer: Medicaid Other | Admitting: Physical Therapy

## 2021-10-16 ENCOUNTER — Encounter: Payer: Self-pay | Admitting: Family Medicine

## 2021-10-16 ENCOUNTER — Ambulatory Visit (INDEPENDENT_AMBULATORY_CARE_PROVIDER_SITE_OTHER): Payer: Medicaid Other | Admitting: Family Medicine

## 2021-10-16 VITALS — BP 110/60 | HR 77 | Temp 97.4°F | Ht 70.0 in | Wt 158.0 lb

## 2021-10-16 DIAGNOSIS — E785 Hyperlipidemia, unspecified: Secondary | ICD-10-CM | POA: Diagnosis not present

## 2021-10-16 DIAGNOSIS — Z87820 Personal history of traumatic brain injury: Secondary | ICD-10-CM

## 2021-10-16 DIAGNOSIS — Z8781 Personal history of (healed) traumatic fracture: Secondary | ICD-10-CM

## 2021-10-16 DIAGNOSIS — S82202D Unspecified fracture of shaft of left tibia, subsequent encounter for closed fracture with routine healing: Secondary | ICD-10-CM

## 2021-10-16 DIAGNOSIS — S82202A Unspecified fracture of shaft of left tibia, initial encounter for closed fracture: Secondary | ICD-10-CM

## 2021-10-16 DIAGNOSIS — R7309 Other abnormal glucose: Secondary | ICD-10-CM | POA: Diagnosis not present

## 2021-10-16 DIAGNOSIS — S069X0S Unspecified intracranial injury without loss of consciousness, sequela: Secondary | ICD-10-CM

## 2021-10-16 DIAGNOSIS — I10 Essential (primary) hypertension: Secondary | ICD-10-CM

## 2021-10-16 DIAGNOSIS — S82402D Unspecified fracture of shaft of left fibula, subsequent encounter for closed fracture with routine healing: Secondary | ICD-10-CM

## 2021-10-16 DIAGNOSIS — D649 Anemia, unspecified: Secondary | ICD-10-CM

## 2021-10-16 DIAGNOSIS — N529 Male erectile dysfunction, unspecified: Secondary | ICD-10-CM | POA: Insufficient documentation

## 2021-10-16 DIAGNOSIS — F17210 Nicotine dependence, cigarettes, uncomplicated: Secondary | ICD-10-CM

## 2021-10-16 DIAGNOSIS — N5201 Erectile dysfunction due to arterial insufficiency: Secondary | ICD-10-CM

## 2021-10-16 LAB — COMPREHENSIVE METABOLIC PANEL
ALT: 9 U/L (ref 0–53)
AST: 15 U/L (ref 0–37)
Albumin: 4.1 g/dL (ref 3.5–5.2)
Alkaline Phosphatase: 83 U/L (ref 39–117)
BUN: 7 mg/dL (ref 6–23)
CO2: 25 mEq/L (ref 19–32)
Calcium: 9.7 mg/dL (ref 8.4–10.5)
Chloride: 104 mEq/L (ref 96–112)
Creatinine, Ser: 0.75 mg/dL (ref 0.40–1.50)
GFR: 97.78 mL/min (ref >60.00)
Glucose, Bld: 91 mg/dL (ref 70–99)
Potassium: 4.4 mEq/L (ref 3.5–5.1)
Sodium: 139 mEq/L (ref 135–145)
Total Bilirubin: 0.4 mg/dL (ref 0.2–1.2)
Total Protein: 6.8 g/dL (ref 6.0–8.3)

## 2021-10-16 LAB — LIPID PANEL
Cholesterol: 217 mg/dL — ABNORMAL HIGH (ref 0–200)
HDL: 37.9 mg/dL — ABNORMAL LOW (ref >39.00)
LDL Cholesterol: 151 mg/dL — ABNORMAL HIGH (ref 0–99)
NonHDL: 178.82
Total CHOL/HDL Ratio: 6
Triglycerides: 140 mg/dL (ref 0.0–149.0)
VLDL: 28 mg/dL (ref 0.0–40.0)

## 2021-10-16 LAB — CBC
HCT: 39.3 % (ref 39.0–52.0)
Hemoglobin: 12.9 g/dL — ABNORMAL LOW (ref 13.0–17.0)
MCHC: 33 g/dL (ref 30.0–36.0)
MCV: 89.5 fl (ref 78.0–100.0)
Platelets: 179 10*3/uL (ref 150.0–400.0)
RBC: 4.39 Mil/uL (ref 4.22–5.81)
RDW: 15.6 % — ABNORMAL HIGH (ref 11.5–15.5)
WBC: 8.4 10*3/uL (ref 4.0–10.5)

## 2021-10-16 LAB — HEMOGLOBIN A1C: Hgb A1c MFr Bld: 6.4 % (ref 4.6–6.5)

## 2021-10-16 MED ORDER — SILDENAFIL CITRATE 20 MG PO TABS: 20.0000 mg | ORAL_TABLET | Freq: Every day | ORAL | 0 refills | Status: DC | PRN
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
## 2021-10-16 NOTE — Assessment & Plan Note (Signed)
Check lipid panel today to help determine if he needs to go back on a statin.

## 2021-10-16 NOTE — Assessment & Plan Note (Signed)
Check A1c.  Glucose was elevated during prior hospitalization.

## 2021-10-16 NOTE — Assessment & Plan Note (Signed)
Patient reports this has healed well.  He will continue to see orthopedics.

## 2021-10-16 NOTE — Assessment & Plan Note (Signed)
Blood pressure is very well controlled off of medication.  We will continue to periodically monitor his blood pressure.

## 2021-10-16 NOTE — Patient Instructions (Signed)
Nice to see you. We will get lab work today. Please continue to see orthopedics. Please continue to work on smoking cessation.

## 2021-10-16 NOTE — Assessment & Plan Note (Signed)
Patient was advised to quit smoking.  Discussed nicotine replacement as an option.  Given his schizophrenia I am not sure that Chantix or Wellbutrin would be appropriate options.

## 2021-10-16 NOTE — Assessment & Plan Note (Signed)
Recovered well 

## 2021-10-16 NOTE — Assessment & Plan Note (Signed)
He will continue to see orthopedics. 
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
## 2021-10-16 NOTE — Assessment & Plan Note (Signed)
Chronic ongoing issue.  Discussed a trial of sildenafil 20-60 mg daily as needed.  Discussed the risk of priapism with this and if he develops that he needs to seek medical attention immediately.  Advised if he ever had chest pain or shortness of breath during intercourse he would need to discontinue this and be evaluated.  Discussed if he ever had to go to the emergency department or call EMS he would need to let them know that he is taking Viagra.

## 2021-10-18 ENCOUNTER — Ambulatory Visit: Payer: Medicaid Other | Admitting: Physical Therapy

## 2021-10-22 ENCOUNTER — Telehealth: Payer: Self-pay

## 2021-10-22 NOTE — Telephone Encounter (Signed)
Patient's significant other called and stated transportation needs a letter stating why the patient needs to follow up with Dr. Marciano Sequin. It also needs to include appointment date and time, which is 11/01/21 at 12:45 pm. It needs to be faxed to [phone] C/O Transportation Department

## 2021-10-23 ENCOUNTER — Ambulatory Visit: Payer: Medicaid Other | Admitting: Physical Therapy

## 2021-10-25 ENCOUNTER — Ambulatory Visit: Payer: Medicaid Other

## 2021-10-30 ENCOUNTER — Ambulatory Visit: Payer: Medicaid Other | Admitting: Physical Therapy

## 2021-10-30 ENCOUNTER — Other Ambulatory Visit: Payer: Self-pay | Admitting: Family Medicine

## 2021-10-30 ENCOUNTER — Telehealth: Payer: Self-pay | Admitting: Family Medicine

## 2021-10-30 ENCOUNTER — Ambulatory Visit: Payer: Medicaid Other | Attending: Physical Medicine & Rehabilitation | Admitting: Physical Therapy

## 2021-10-30 DIAGNOSIS — N5201 Erectile dysfunction due to arterial insufficiency: Secondary | ICD-10-CM

## 2021-10-30 DIAGNOSIS — E785 Hyperlipidemia, unspecified: Secondary | ICD-10-CM

## 2021-10-30 NOTE — Telephone Encounter (Signed)
Patient is requesting a refill on his sildenafil (REVATIO) 20 MG tablet.   Also, he never received his prescription for pravastatin 10 mg

## 2021-10-31 MED ORDER — PRAVASTATIN SODIUM 10 MG PO TABS: 10.0000 mg | ORAL_TABLET | Freq: Every day | ORAL | 3 refills | Status: DC

## 2021-10-31 MED ORDER — SILDENAFIL CITRATE 20 MG PO TABS: ORAL_TABLET | ORAL | 0 refills | Status: DC

## 2021-10-31 NOTE — Telephone Encounter (Signed)
I called the patient and spoke with him and he is scheduled for labs in 6 weeks.  Mandolin Falwell,cma

## 2021-10-31 NOTE — Addendum Note (Signed)
Addended by: Leone Haven on: 10/31/2021 08:49 AM   Modules accepted: Orders

## 2021-10-31 NOTE — Telephone Encounter (Signed)
Noted.  Prescription sent to the pharmacy.  Patient needs lab work 6 weeks after starting on the pravastatin.  Orders placed.  Please get him scheduled for this.  Thanks.

## 2021-11-01 ENCOUNTER — Ambulatory Visit: Payer: Medicaid Other | Admitting: Physical Therapy

## 2021-11-01 ENCOUNTER — Encounter: Payer: Medicaid Other | Attending: Physical Medicine & Rehabilitation | Admitting: Physical Medicine & Rehabilitation

## 2021-11-01 ENCOUNTER — Encounter: Payer: Self-pay | Admitting: Physical Medicine & Rehabilitation

## 2021-11-01 VITALS — BP 120/81 | HR 70 | Ht 70.0 in | Wt 158.0 lb

## 2021-11-01 DIAGNOSIS — T07XXXA Unspecified multiple injuries, initial encounter: Secondary | ICD-10-CM | POA: Insufficient documentation

## 2021-11-01 DIAGNOSIS — M5432 Sciatica, left side: Secondary | ICD-10-CM | POA: Insufficient documentation

## 2021-11-01 DIAGNOSIS — Z720 Tobacco use: Secondary | ICD-10-CM | POA: Diagnosis present

## 2021-11-01 DIAGNOSIS — S82202S Unspecified fracture of shaft of left tibia, sequela: Secondary | ICD-10-CM | POA: Diagnosis not present

## 2021-11-01 DIAGNOSIS — S32592S Other specified fracture of left pubis, sequela: Secondary | ICD-10-CM | POA: Diagnosis present

## 2021-11-01 DIAGNOSIS — S32591S Other specified fracture of right pubis, sequela: Secondary | ICD-10-CM | POA: Diagnosis not present

## 2021-11-01 DIAGNOSIS — S82402S Unspecified fracture of shaft of left fibula, sequela: Secondary | ICD-10-CM | POA: Diagnosis present

## 2021-11-01 MED ORDER — GABAPENTIN 100 MG PO CAPS: 100.0000 mg | ORAL_CAPSULE | Freq: Three times a day (TID) | ORAL | 3 refills | Status: DC
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
## 2021-11-06 ENCOUNTER — Ambulatory Visit: Payer: Medicaid Other | Admitting: Physical Therapy

## 2021-11-08 ENCOUNTER — Ambulatory Visit: Payer: Medicaid Other | Admitting: Physical Therapy

## 2021-11-13 ENCOUNTER — Ambulatory Visit: Payer: Medicaid Other

## 2021-11-15 ENCOUNTER — Ambulatory Visit: Payer: Medicaid Other | Admitting: Physical Therapy

## 2021-11-20 ENCOUNTER — Ambulatory Visit: Payer: Medicaid Other

## 2021-11-20 ENCOUNTER — Ambulatory Visit: Payer: Medicaid Other | Admitting: Physical Therapy

## 2021-11-22 ENCOUNTER — Ambulatory Visit: Payer: Medicaid Other | Admitting: Physical Therapy

## 2021-11-27 ENCOUNTER — Ambulatory Visit: Payer: Medicaid Other

## 2021-11-27 ENCOUNTER — Telehealth: Payer: Self-pay

## 2021-11-27 MED ORDER — GABAPENTIN 100 MG PO CAPS: 300.0000 mg | ORAL_CAPSULE | Freq: Three times a day (TID) | ORAL | 3 refills | Status: DC

## 2021-11-27 NOTE — Telephone Encounter (Signed)
Richard Vasquez is requesting an increase in his Gabapentin. He is not getting enough pain relief. Per patient he has had Gabapentin 300  MG, three times a day. And it was worked better.   Please advise.  Call back phone [phone]

## 2021-11-27 NOTE — Addendum Note (Signed)
Addended by: Jennye Boroughs on: 11/27/2021 09:42 PM   Modules accepted: Orders

## 2021-11-28 NOTE — Telephone Encounter (Signed)
Generic voice message left for Richard Vasquez. Rx has been sent.

## 2021-11-29 ENCOUNTER — Ambulatory Visit: Payer: Medicaid Other

## 2021-12-04 ENCOUNTER — Ambulatory Visit: Payer: Medicaid Other

## 2021-12-06 ENCOUNTER — Ambulatory Visit: Payer: Medicaid Other

## 2021-12-10 ENCOUNTER — Other Ambulatory Visit (INDEPENDENT_AMBULATORY_CARE_PROVIDER_SITE_OTHER): Payer: Medicaid Other

## 2021-12-10 DIAGNOSIS — E785 Hyperlipidemia, unspecified: Secondary | ICD-10-CM

## 2021-12-10 LAB — HEPATIC FUNCTION PANEL
ALT: 14 U/L (ref 0–53)
AST: 26 U/L (ref 0–37)
Albumin: 4.6 g/dL (ref 3.5–5.2)
Alkaline Phosphatase: 61 U/L (ref 39–117)
Bilirubin, Direct: 0.1 mg/dL (ref 0.0–0.3)
Total Bilirubin: 0.3 mg/dL (ref 0.2–1.2)
Total Protein: 7.1 g/dL (ref 6.0–8.3)

## 2021-12-10 LAB — LDL CHOLESTEROL, DIRECT: Direct LDL: 99 mg/dL

## 2021-12-11 ENCOUNTER — Telehealth: Payer: Self-pay

## 2021-12-11 ENCOUNTER — Ambulatory Visit: Payer: Medicaid Other

## 2021-12-11 NOTE — Telephone Encounter (Signed)
Lvm for pt to return call in regards to lab results.  Per Dr.Sonnenberg: Please let the patient know that his LDL cholesterol has come down some though it would be beneficial to get it down even lower.  Has he tolerated the pravastatin 10 mg daily?  If he has tolerated this I would like to increase this to 20 mg daily and recheck his LDL and hepatic function panel in 6 weeks.

## 2021-12-13 ENCOUNTER — Ambulatory Visit: Payer: Medicaid Other | Admitting: Physical Therapy

## 2021-12-14 ENCOUNTER — Other Ambulatory Visit: Payer: Self-pay

## 2021-12-14 DIAGNOSIS — E785 Hyperlipidemia, unspecified: Secondary | ICD-10-CM

## 2021-12-14 MED ORDER — PRAVASTATIN SODIUM 20 MG PO TABS: 20.0000 mg | ORAL_TABLET | Freq: Every day | ORAL | 1 refills | Status: DC

## 2021-12-18 ENCOUNTER — Encounter: Payer: Self-pay | Admitting: Adult Health

## 2021-12-18 ENCOUNTER — Telehealth (INDEPENDENT_AMBULATORY_CARE_PROVIDER_SITE_OTHER): Payer: Medicaid Other | Admitting: Adult Health

## 2021-12-18 VITALS — Ht 70.0 in | Wt 158.0 lb

## 2021-12-18 DIAGNOSIS — K2100 Gastro-esophageal reflux disease with esophagitis, without bleeding: Secondary | ICD-10-CM

## 2021-12-18 MED ORDER — PANTOPRAZOLE SODIUM 40 MG PO TBEC: 40.0000 mg | DELAYED_RELEASE_TABLET | Freq: Every day | ORAL | 0 refills | Status: DC

## 2021-12-18 NOTE — Progress Notes (Signed)
Virtual Visit via Video Note  I connected with Richard Vasquez on 12/18/21 at  5:15 PM EST by a video enabled telemedicine application and verified that I am speaking with the correct person using two identifiers.  Location patient: home Location provider:work or home office Persons participating in the virtual visit: patient, provider  I discussed the limitations of evaluation and management by telemedicine and the availability of in person appointments. The patient expressed understanding and agreed to proceed.   HPI:  61 year old male, patient of Dr. Caryl Vasquez.  He is being evaluated today for recurrent issue of GERD.  Reports his symptoms have been managed pretty well until the last 2 to 3 weeks where he has had worsening acid indigestion with burning sensation in esophagus, burning in his stomach, and a sour taste in his mouth.  Symptoms are worse when he lays down.  In the past he was on "some type of medication" and it worked pretty well for him.  Denies n/v/d/c  ROS: See pertinent positives and negatives per HPI.  Past Medical History:  Diagnosis Date   Anxiety    Arthritis    BPH (benign prostatic hyperplasia)    C2 cervical fracture (HCC) 06/12/2018   Chronic pain    Closed displaced supracondylar fracture of distal end of right femur with intracondylar extension ([place]) 06/10/2018   Closed fracture of distal end of right femur ([place]) 06/09/2018   Closed left hip fracture, initial encounter ([place]) 08/18/2018   Depression    Hepatitis C    Paranoid schizophrenia ([place])    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[facility])    Sleep apnea     Past Surgical History:  Procedure Laterality Date   COLONOSCOPY WITH PROPOFOL N/A 05/18/2020   Procedure: COLONOSCOPY WITH PROPOFOL;  Surgeon: [name], MD;  Location: [facility] ENDOSCOPY;  Service: Endoscopy;  Laterality: N/A;   FRACTURE SURGERY     HIP PINNING,CANNULATED Left 08/18/2018   Procedure: CANNULATED HIP PINNING, Right knee aspiration;   Surgeon: Thornton Park, MD;  Location: [facility] ORS;  Service: Orthopedics;  Laterality: Left;   JOINT REPLACEMENT     ORIF FEMUR FRACTURE Right 06/10/2018   Procedure: OPEN REDUCTION INTERNAL FIXATION (ORIF) DISTAL FEMUR FRACTURE;  Surgeon: Shona Needles, MD;  Location: [place];  Service: Orthopedics;  Laterality: Right;   TIBIA IM NAIL INSERTION Left 05/17/2021   Procedure: INTRAMEDULLARY NAILING OF LEFT TIBIA, STRESS EXAM OF PELVIS;  Surgeon: Shona Needles, MD;  Location: [place];  Service: Orthopedics;  Laterality: Left;    Family History  Problem Relation Age of Onset   Cancer Mother    Diabetes Mother    Diabetes Paternal Aunt    Lung cancer Paternal Uncle        Current Outpatient Medications:    acetaminophen (TYLENOL) 325 MG tablet, Take 1-2 tablets (325-650 mg total) by mouth every 4 (four) hours as needed for mild pain., Disp: , Rfl:    albuterol (VENTOLIN HFA) 108 (90 Base) MCG/ACT inhaler, Inhale 1-2 puffs into the lungs every 6 (six) hours as needed for wheezing or shortness of breath., Disp: 18 g, Rfl: 0   Ascorbic Acid (VITAMIN C PO), Take 1 capsule by mouth daily., Disp: , Rfl:    diclofenac Sodium (VOLTAREN) 1 % GEL, Apply 2 g topically 4 (four) times daily., Disp: 100 g, Rfl: 0   ferrous sulfate 325 (65 FE) MG tablet, Take 1 tablet (325 mg total) by mouth 2 (two) times daily with a meal., Disp:  60 tablet, Rfl: 0   gabapentin (NEURONTIN) 100 MG capsule, Take 3 capsules (300 mg total) by mouth 3 (three) times daily., Disp: 90 capsule, Rfl: 3   hydrochlorothiazide (HYDRODIURIL) 12.5 MG tablet, Take 1 tablet (12.5 mg total) by mouth daily., Disp: 30 tablet, Rfl: 0   methocarbamol (ROBAXIN) 750 MG tablet, Take 1 tablet (750 mg total) by mouth 4 (four) times daily., Disp: 120 tablet, Rfl: 0   OLANZapine (ZYPREXA) 10 MG tablet, Take '10mg'$  by mouth once daily, Disp: 30 tablet, Rfl: 0   Omega-3 Fatty Acids (FISH OIL PO), Take 1 capsule by mouth daily., Disp: , Rfl:    oxyCODONE  (ROXICODONE) 5 MG immediate release tablet, Take 1 tablet (5 mg total) by mouth every 8 (eight) hours as needed for severe pain., Disp: 30 tablet, Rfl: 0   pantoprazole (PROTONIX) 40 MG tablet, Take 1 tablet (40 mg total) by mouth daily., Disp: 30 tablet, Rfl: 0   polyethylene glycol (MIRALAX / GLYCOLAX) 17 g packet, Take 17 g by mouth daily., Disp: 14 each, Rfl: 0   pravastatin (PRAVACHOL) 20 MG tablet, Take 1 tablet (20 mg total) by mouth daily., Disp: 90 tablet, Rfl: 1   sildenafil (REVATIO) 20 MG tablet, TAKE 1 TO 3 TABLETS BY MOUTH ONCE DAILY AS NEEDED FOR ERECTILE DYSFUNCTION, Disp: 20 tablet, Rfl: 0   Tiotropium Bromide Monohydrate (SPIRIVA RESPIMAT) 2.5 MCG/ACT AERS, Inhale 2 puffs into the lungs daily., Disp: 4 g, Rfl: 2   traZODone (DESYREL) 100 MG tablet, Take 1 tablet (100 mg total) by mouth at bedtime., Disp: 30 tablet, Rfl: 0   Vitamin D, Ergocalciferol, (DRISDOL) 1.25 MG (50000 UNIT) CAPS capsule, Take 1 capsule (50,000 Units total) by mouth once a week., Disp: 5 capsule, Rfl: 0  EXAM:  VITALS per patient if applicable:  GENERAL: alert, oriented, appears well and in no acute distress  HEENT: atraumatic, conjunttiva clear, no obvious abnormalities on inspection of external nose and ears  NECK: normal movements of the head and neck  LUNGS: on inspection no signs of respiratory distress, breathing rate appears normal, no obvious gross SOB, gasping or wheezing  CV: no obvious cyanosis  MS: moves all visible extremities without noticeable abnormality  PSYCH/NEURO: pleasant and cooperative, no obvious depression or anxiety, speech and thought processing grossly intact  ASSESSMENT AND PLAN:  Discussed the following assessment and plan:  1. Gastroesophageal reflux disease with esophagitis without hemorrhage - resend in Protonix for 30 days. Advised following up with PCP for further evaluation  - pantoprazole (PROTONIX) 40 MG tablet; Take 1 tablet (40 mg total) by mouth daily.   Dispense: 30 tablet; Refill: 0      I discussed the assessment and treatment plan with the patient. The patient was provided an opportunity to ask questions and all were answered. The patient agreed with the plan and demonstrated an understanding of the instructions.   The patient was advised to call back or seek an in-person evaluation if the symptoms worsen or if the condition fails to improve as anticipated.   Dorothyann Peng, NP

## 2021-12-19 ENCOUNTER — Ambulatory Visit: Payer: Medicaid Other

## 2021-12-25 ENCOUNTER — Ambulatory Visit: Payer: Medicaid Other

## 2021-12-27 ENCOUNTER — Ambulatory Visit: Payer: Medicaid Other | Admitting: Physical Therapy

## 2022-01-03 ENCOUNTER — Ambulatory Visit: Payer: Medicaid Other | Admitting: Physical Therapy

## 2022-01-05 ENCOUNTER — Emergency Department
Admission: EM | Admit: 2022-01-05 | Discharge: 2022-01-05 | Disposition: A | Discharge status: Home / Self Care | Payer: Medicaid Other | Attending: Emergency Medicine | Admitting: Emergency Medicine

## 2022-01-05 ENCOUNTER — Emergency Department: Payer: Medicaid Other

## 2022-01-05 ENCOUNTER — Other Ambulatory Visit: Payer: Self-pay

## 2022-01-05 DIAGNOSIS — R456 Violent behavior: Secondary | ICD-10-CM | POA: Diagnosis present

## 2022-01-05 DIAGNOSIS — T38891A Poisoning by other hormones and synthetic substitutes, accidental (unintentional), initial encounter: Secondary | ICD-10-CM | POA: Insufficient documentation

## 2022-01-05 DIAGNOSIS — T50904A Poisoning by unspecified drugs, medicaments and biological substances, undetermined, initial encounter: Secondary | ICD-10-CM

## 2022-01-05 DIAGNOSIS — J189 Pneumonia, unspecified organism: Secondary | ICD-10-CM

## 2022-01-05 DIAGNOSIS — R410 Disorientation, unspecified: Secondary | ICD-10-CM | POA: Diagnosis not present

## 2022-01-05 DIAGNOSIS — T450X1A Poisoning by antiallergic and antiemetic drugs, accidental (unintentional), initial encounter: Secondary | ICD-10-CM | POA: Diagnosis not present

## 2022-01-05 DIAGNOSIS — Z1152 Encounter for screening for COVID-19: Secondary | ICD-10-CM | POA: Diagnosis not present

## 2022-01-05 DIAGNOSIS — R451 Restlessness and agitation: Secondary | ICD-10-CM

## 2022-01-05 LAB — CBC WITH DIFFERENTIAL/PLATELET
Abs Immature Granulocytes: 0.03 10*3/uL (ref 0.00–0.07)
Basophils Absolute: 0 10*3/uL (ref 0.0–0.1)
Basophils Relative: 0 %
Eosinophils Absolute: 0.1 10*3/uL (ref 0.0–0.5)
Eosinophils Relative: 1 %
HCT: 35.8 % — ABNORMAL LOW (ref 39.0–52.0)
Hemoglobin: 12 g/dL — ABNORMAL LOW (ref 13.0–17.0)
Immature Granulocytes: 0 %
Lymphocytes Relative: 15 %
Lymphs Abs: 1.1 10*3/uL (ref 0.7–4.0)
MCH: 31.4 pg (ref 26.0–34.0)
MCHC: 33.5 g/dL (ref 30.0–36.0)
MCV: 93.7 fL (ref 80.0–100.0)
Monocytes Absolute: 0.4 10*3/uL (ref 0.1–1.0)
Monocytes Relative: 6 %
Neutro Abs: 5.6 10*3/uL (ref 1.7–7.7)
Neutrophils Relative %: 78 %
Platelets: 185 10*3/uL (ref 150–400)
RBC: 3.82 MIL/uL — ABNORMAL LOW (ref 4.22–5.81)
RDW: 12 % (ref 11.5–15.5)
WBC: 7.2 10*3/uL (ref 4.0–10.5)
nRBC: 0 % (ref 0.0–0.2)

## 2022-01-05 LAB — COMPREHENSIVE METABOLIC PANEL
ALT: 18 U/L (ref 0–44)
AST: 38 U/L (ref 15–41)
Albumin: 3.7 g/dL (ref 3.5–5.0)
Alkaline Phosphatase: 56 U/L (ref 38–126)
Anion gap: 9 (ref 5–15)
BUN: 8 mg/dL (ref 8–23)
CO2: 21 mmol/L — ABNORMAL LOW (ref 22–32)
Calcium: 9.5 mg/dL (ref 8.9–10.3)
Chloride: 110 mmol/L (ref 98–111)
Creatinine, Ser: 0.76 mg/dL (ref 0.61–1.24)
GFR, Estimated: >60 mL/min (ref >60)
Glucose, Bld: 116 mg/dL — ABNORMAL HIGH (ref 70–99)
Potassium: 3.1 mmol/L — ABNORMAL LOW (ref 3.5–5.1)
Sodium: 140 mmol/L (ref 135–145)
Total Bilirubin: 0.8 mg/dL (ref 0.3–1.2)
Total Protein: 7.4 g/dL (ref 6.5–8.1)

## 2022-01-05 LAB — RESP PANEL BY RT-PCR (RSV, FLU A&B, COVID)  RVPGX2
Influenza A by PCR: NEGATIVE
Influenza B by PCR: NEGATIVE
Resp Syncytial Virus by PCR: NEGATIVE
SARS Coronavirus 2 by RT PCR: NEGATIVE

## 2022-01-05 LAB — SALICYLATE LEVEL: Salicylate Lvl: <7 mg/dL — ABNORMAL LOW (ref 7.0–30.0)

## 2022-01-05 LAB — ACETAMINOPHEN LEVEL: Acetaminophen (Tylenol), Serum: <10 ug/mL — ABNORMAL LOW (ref 10–30)

## 2022-01-05 LAB — ETHANOL: Alcohol, Ethyl (B): <10 mg/dL (ref <10)

## 2022-01-05 MED ORDER — OLANZAPINE 10 MG PO TABS: 10.0000 mg | ORAL_TABLET | Freq: Every day | ORAL | Status: DC

## 2022-01-05 MED ORDER — IOHEXOL 300 MG/ML  SOLN
80.0000 mL | Freq: Once | INTRAMUSCULAR | Status: AC | PRN
  Administered 2022-01-05: 80 mL via INTRAVENOUS

## 2022-01-05 MED ORDER — SODIUM CHLORIDE 0.9 % IV BOLUS
1000.0000 mL | Freq: Once | INTRAVENOUS | Status: AC
  Administered 2022-01-05: 1000 mL via INTRAVENOUS

## 2022-01-05 MED ORDER — AMOXICILLIN-POT CLAVULANATE 875-125 MG PO TABS: 1.0000 | ORAL_TABLET | Freq: Two times a day (BID) | ORAL | 0 refills | Status: AC

## 2022-01-05 NOTE — ED Notes (Signed)
Patient is vol pending consult

## 2022-01-05 NOTE — ED Notes (Signed)
Pt placed on 2L [place] for low O2 level at 91-92%, and ease of breathing.

## 2022-01-05 NOTE — ED Provider Notes (Signed)
[facility] Provider Note    Event Date/Time   First MD Initiated Contact with Patient 01/05/22 1149     (approximate)   History   Chief Complaint: Agitation  HPI  Richard Vasquez is a 61 y.o. male with a history of BPH, anxiety, schizophrenia, nicotine dependence who is brought to the ED today due to potential overdose of medication containing melatonin and Benadryl.  Patient also took his usual medications this morning.  EMS reports that at home the patient was agitated and uncooperative.  He was given intramuscular Haldol and Versed to allow for safe transportation.  Here the patient is calm and cooperative.  He denies any acute pain.  Does report that he feels short of breath mildly, no cough.  Reports that he felt very anxious because he is quitting smoking and tried to compensate for this with his melatonin medication.  Denies SI or intentionally trying to harm himself.  No hallucinations or HI.     Physical Exam   Triage Vital Signs: ED Triage Vitals  Enc Vitals Group     BP      Pulse      Resp      Temp      Temp src      SpO2      Weight      Height      Head Circumference      Peak Flow      Pain Score      Pain Loc      Pain Edu?      Excl. in [place]?     Most recent vital signs: There were no vitals filed for this visit.  General: Awake, no distress.  CV:  Good peripheral perfusion.  Regular rate and rhythm Resp:  Normal effort.  Clear to auscultation bilaterally Abd:  No distention.  Soft nontender Other:  No lower extremity edema.  No calf tenderness.  There are small linear rashes on bilateral lower extremities consistent with scabies.  Patient does report these are itchy   ED Results / Procedures / Treatments   Labs (all labs ordered are listed, but only abnormal results are displayed) Labs Reviewed  RESP PANEL BY RT-PCR (RSV, FLU A&B, COVID)  [id number]  ACETAMINOPHEN LEVEL  COMPREHENSIVE METABOLIC PANEL  ETHANOL   SALICYLATE LEVEL  CBC WITH DIFFERENTIAL/PLATELET  URINE DRUG SCREEN, QUALITATIVE ([place])     EKG Interpreted by me Normal sinus rhythm rate of 97.  Normal axis intervals QRS ST segments and T waves   RADIOLOGY Chest x-ray interpreted by me, appears normal.  Radiology report reviewed   PROCEDURES:  Procedures   MEDICATIONS ORDERED IN ED: Medications  sodium chloride 0.9 % bolus 1,000 mL (has no administration in time range)     IMPRESSION / MDM / ASSESSMENT AND PLAN / ED COURSE  I reviewed the triage vital signs and the nursing notes.                              Differential diagnosis includes, but is not limited to, medication side effect, delirium, electrolyte abnormality, intoxication, pneumonia, pleural effusion  Patient's presentation is most consistent with acute presentation with potential threat to life or bodily function.  Patient presents with confusion and agitation at home, which appears to be due to medication side effect.  He was initially IVC for safety, but after arrival in the ED the patient  has remained calm and cooperative.  He was seen by psychiatry who also advises that he does not have any acute psychiatric needs, does not require psych admission, does not require IVC commitment, and can be discharged once medically stable and clinically sober.  Will continue to observe in ED until clinically sober       FINAL CLINICAL IMPRESSION(S) / ED DIAGNOSES   Final diagnoses:  Overdose of undetermined intent, initial encounter  Agitation     Rx / DC Orders   ED Discharge Orders     None        Note:  This document was prepared using Dragon voice recognition software and may include unintentional dictation errors.   Carrie Mew, MD 01/05/22 1331
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
## 2022-01-05 NOTE — ED Notes (Signed)
XR at bedside at this time.

## 2022-01-05 NOTE — Discharge Instructions (Addendum)
Continue taking all of your home medications as usual.  Avoid taking extra of any medications.  If nicotine withdrawal symptoms and cravings are difficult to manage, consider using nicotine replacement products such as a nicotine patch or gum.  A CT scan of your chest today shows a mild pneumonia.  Your oxygen level and other vital signs are all normal.  Take antibiotics as prescribed to treat the pneumonia.  Follow-up with primary care to ensure improvement and to follow-up on this finding as needed.  CT scan summary: IMPRESSION:  1. Right lung opacities as detailed, predominantly ground-glass  opacities, with more confluent opacity in the right lower lobe  associated with bronchial wall thickening and mucous plugging.  Findings consistent with a combination of multifocal pneumonia and a  component of dependent right lower lobe atelectasis.  2. 6 mm nodule in the right upper lobe not evident on the prior CT.  Given that this has developed in the short interval, is likely  infectious/inflammatory. Non-contrast chest CT at 6-12 months is  recommended. If the nodule is stable at time of repeat CT, then  future CT at 18-24 months (from today's scan) is considered optional  for low-risk patients, but is recommended for high-risk patients.  This recommendation follows the consensus statement: Guidelines for  Management of Incidental Pulmonary Nodules Detected on CT Images:  From the Fleischner Society 2017; Radiology 2017; 284:228-243.  3. No other acute abnormalities in the chest.  4. Coronary artery calcifications and aortic atherosclerosis.  Emphysema.    Aortic Atherosclerosis (ICD10-I70.0) and Emphysema (ICD10-J43.9).

## 2022-01-05 NOTE — ED Provider Notes (Addendum)
Procedures     ----------------------------------------- 2:47 PM on 01/05/2022 ----------------------------------------- Patient is awake and alert, clear speech, feels normal.  No shortness of breath or other symptoms currently.  Oriented.  Oxygenation is 99% on room air.  Lungs are clear, regular rate and rhythm.  Cerebellar function is normal.  He appears to be clinically sober from the side effects of accidental medication overdose.  Will p.o. trial and plan for discharge.   Chest x-ray showed some nodular abnormality.  Follow-up CT scan shows that this is likely infectious due to a mild community-acquired pneumonia.  I will start the patient on Augmentin due to his underlying emphysema.  Patient's been informed and recommended to follow-up with primary care who can ensure appropriate interval radiology follow-up.   Carrie Mew, MD 01/05/22 1452

## 2022-01-05 NOTE — ED Notes (Signed)
Patient unable to participate with the assessor at this time.

## 2022-01-05 NOTE — Consult Note (Signed)
[facility] Face-to-Face Psychiatry Consult   Reason for Consult: Consult for 61 year old man with a past history of schizophrenia who was brought to the hospital after taking what appears to be an excessive amount of Benadryl Referring Physician: Joni Fears Patient Identification: Richard Vasquez MRN:  [id number] Principal Diagnosis: Acute delirium Diagnosis:  Principal Problem:   Acute delirium   Total Time spent with patient: 45 minutes  Subjective:   Richard Vasquez is a 61 y.o. male patient admitted with "I took too many pills".  HPI: Patient seen and chart reviewed.  61 year old man with a history of schizophrenia brought in by EMS with reports that he took an excessive number of Benadryl.  Patient was only placed under IVC after coming to the emergency room.  Patient was in a hospital bed on monitoring and oxygen when I saw him.  He told me that he took about 6 Benadryl tablets.  He completely denied any suicidal thought intent or plan.  He told me that he did it because he thought it would help him to quit smoking.  Patient denies having had any hallucinations recently.  Denies suicidal thought.  Says that he has been otherwise doing well at home.  Says he has been following up with his outpatient mental health treatment.  Past Psychiatric History: Past history of schizophrenia with previous mental health inpatient treatment and evaluation.  Last hospitalization 2020.  No history of suicide attempts  Risk to Self:   Risk to Others:   Prior Inpatient Therapy:   Prior Outpatient Therapy:    Past Medical History:  Past Medical History:  Diagnosis Date   Anxiety    Arthritis    BPH (benign prostatic hyperplasia)    C2 cervical fracture ([place]) 06/12/2018   Chronic pain    Closed displaced supracondylar fracture of distal end of right femur with intracondylar extension ([place]) 06/10/2018   Closed fracture of distal end of right femur ([place]) 06/09/2018   Closed left hip fracture, initial encounter  ([place]) 08/18/2018   Depression    Hepatitis C    Paranoid schizophrenia ([place])    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[facility])    Sleep apnea     Past Surgical History:  Procedure Laterality Date   COLONOSCOPY WITH PROPOFOL N/A 05/18/2020   Procedure: COLONOSCOPY WITH PROPOFOL;  Surgeon: [name], MD;  Location: [facility] ENDOSCOPY;  Service: Endoscopy;  Laterality: N/A;   FRACTURE SURGERY     HIP PINNING,CANNULATED Left 08/18/2018   Procedure: CANNULATED HIP PINNING, Right knee aspiration;  Surgeon: Thornton Park, MD;  Location: [facility] ORS;  Service: Orthopedics;  Laterality: Left;   JOINT REPLACEMENT     ORIF FEMUR FRACTURE Right 06/10/2018   Procedure: OPEN REDUCTION INTERNAL FIXATION (ORIF) DISTAL FEMUR FRACTURE;  Surgeon: Shona Needles, MD;  Location: [place];  Service: Orthopedics;  Laterality: Right;   TIBIA IM NAIL INSERTION Left 05/17/2021   Procedure: INTRAMEDULLARY NAILING OF LEFT TIBIA, STRESS EXAM OF PELVIS;  Surgeon: Shona Needles, MD;  Location: [place];  Service: Orthopedics;  Laterality: Left;   Family History:  Family History  Problem Relation Age of Onset   Cancer Mother    Diabetes Mother    Diabetes Paternal Aunt    Lung cancer Paternal Uncle    Family Psychiatric  History: None reported Social History   Substance and Sexual Activity  Alcohol Use Not Currently     Social History   Substance and Sexual Activity  Drug Use Not Currently  Social History   Socioeconomic History   Marital status: Divorced    Spouse name: Not on file   Number of children: Not on file   Years of education: Not on file   Highest education level: Not on file  Occupational History   Not on file  Tobacco Use   Smoking status: Former    Packs/day: 1.00    Years: 20.00    Total pack years: 20.00    Types: Cigarettes    Quit date: 02/28/2021    Years since quitting: 0.8   Smokeless tobacco: Never  Vaping Use   Vaping Use: Never used  Substance and Sexual Activity   Alcohol use:  Not Currently   Drug use: Not Currently   Sexual activity: Not on file  Other Topics Concern   Not on file  Social History Narrative   ** Merged History Encounter **       Social Determinants of Health   Financial Resource Strain: Medium Risk (01/23/2021)   Overall Financial Resource Strain (CARDIA)    Difficulty of Paying Living Expenses: Somewhat hard  Food Insecurity: No Food Insecurity (01/23/2021)   Hunger Vital Sign    Worried About Running Out of Food in the Last Year: Never true    Ran Out of Food in the Last Year: Never true  Transportation Needs: No Transportation Needs (01/23/2021)   PRAPARE - Hydrologist (Medical): No    Lack of Transportation (Non-Medical): No  Physical Activity: Insufficiently Active (11/04/2018)   Exercise Vital Sign    Days of Exercise per Week: 4 days    Minutes of Exercise per Session: 10 min  Stress: Stress Concern Present (06/12/2018)   [place]    Feeling of Stress : Very much  Social Connections: Not on file   Additional Social History:    Allergies:  No Known Allergies  Labs:  Results for orders placed or performed during the hospital encounter of 01/05/22 (from the past 48 hour(s))  Acetaminophen level     Status: Abnormal   Collection Time: 01/05/22 11:58 AM  Result Value Ref Range   Acetaminophen (Tylenol), Serum <10 (L) 10 - 30 ug/mL    Comment: (NOTE) Therapeutic concentrations vary significantly. A range of 10-30 ug/mL  may be an effective concentration for many patients. However, some  are best treated at concentrations outside of this range. Acetaminophen concentrations >150 ug/mL at 4 hours after ingestion  and >50 ug/mL at 12 hours after ingestion are often associated with  toxic reactions.  Performed at [facility], [address] [place], [place] [address]   Comprehensive metabolic panel     Status: Abnormal    Collection Time: 01/05/22 11:58 AM  Result Value Ref Range   Sodium 140 135 - 145 mmol/L   Potassium 3.1 (L) 3.5 - 5.1 mmol/L   Chloride 110 98 - 111 mmol/L   CO2 21 (L) 22 - 32 mmol/L   Glucose, Bld 116 (H) 70 - 99 mg/dL    Comment: Glucose reference range applies only to samples taken after fasting for at least 8 hours.   BUN 8 8 - 23 mg/dL   Creatinine, Ser 0.76 0.61 - 1.24 mg/dL   Calcium 9.5 8.9 - 10.3 mg/dL   Total Protein 7.4 6.5 - 8.1 g/dL   Albumin 3.7 3.5 - 5.0 g/dL   AST 38 15 - 41 U/L   ALT 18 0 -  44 U/L   Alkaline Phosphatase 56 38 - 126 U/L   Total Bilirubin 0.8 0.3 - 1.2 mg/dL   GFR, Estimated >60 >60 mL/min    Comment: (NOTE) Calculated using the CKD-EPI Creatinine Equation (2021)    Anion gap 9 5 - 15    Comment: Performed at [facility], [address] [place], [place] [address]  Ethanol     Status: None   Collection Time: 01/05/22 11:58 AM  Result Value Ref Range   Alcohol, Ethyl (B) <10 <10 mg/dL    Comment: (NOTE) Lowest detectable limit for serum alcohol is 10 mg/dL.  For medical purposes only. Performed at [facility], [address] [place], [place] [address]   Salicylate level     Status: Abnormal   Collection Time: 01/05/22 11:58 AM  Result Value Ref Range   Salicylate Lvl <0.1 (L) 7.0 - 30.0 mg/dL    Comment: Performed at [facility], [address] [place], [place] [address]  CBC with Differential     Status: Abnormal   Collection Time: 01/05/22 11:58 AM  Result Value Ref Range   WBC 7.2 4.0 - 10.5 K/uL   RBC 3.82 (L) 4.22 - 5.81 MIL/uL   Hemoglobin 12.0 (L) 13.0 - 17.0 g/dL   HCT 35.8 (L) 39.0 - 52.0 %   MCV 93.7 80.0 - 100.0 fL   MCH 31.4 26.0 - 34.0 pg   MCHC 33.5 30.0 - 36.0 g/dL   RDW 12.0 11.5 - 15.5 %   Platelets 185 150 - 400 K/uL   nRBC 0.0 0.0 - 0.2 %   Neutrophils Relative % 78 %   Neutro Abs 5.6 1.7 - 7.7 K/uL   Lymphocytes Relative 15 %   Lymphs Abs 1.1 0.7 - 4.0 K/uL   Monocytes  Relative 6 %   Monocytes Absolute 0.4 0.1 - 1.0 K/uL   Eosinophils Relative 1 %   Eosinophils Absolute 0.1 0.0 - 0.5 K/uL   Basophils Relative 0 %   Basophils Absolute 0.0 0.0 - 0.1 K/uL   Immature Granulocytes 0 %   Abs Immature Granulocytes 0.03 0.00 - 0.07 K/uL    Comment: Performed at [facility], [address] [place], [place] [address]  Resp panel by RT-PCR (RSV, Flu A&B, Covid) Anterior Nasal Swab     Status: None   Collection Time: 01/05/22 11:58 AM   Specimen: Anterior Nasal Swab  Result Value Ref Range   SARS Coronavirus 2 by RT PCR NEGATIVE NEGATIVE    Comment: (NOTE) SARS-CoV-2 target nucleic acids are NOT DETECTED.  The SARS-CoV-2 RNA is generally detectable in upper respiratory specimens during the acute phase of infection. The lowest concentration of SARS-CoV-2 viral copies this assay can detect is 138 copies/mL. A negative result does not preclude SARS-Cov-2 infection and should not be used as the sole basis for treatment or other patient management decisions. A negative result may occur with  improper specimen collection/handling, submission of specimen other than nasopharyngeal swab, presence of viral mutation(s) within the areas targeted by this assay, and inadequate number of viral copies(<138 copies/mL). A negative result must be combined with clinical observations, patient history, and epidemiological information. The expected result is Negative.  Fact Sheet for Patients:  EntrepreneurPulse.com.au  Fact Sheet for Healthcare Providers:  IncredibleEmployment.be  This test is no t yet approved or cleared by the [place] FDA and  has been authorized for detection and/or diagnosis of SARS-CoV-2 by FDA under an Emergency Use Authorization (  EUA). This EUA will remain  in effect (meaning this test can be used) for the duration of the COVID-19 declaration under Section 564(b)(1) of the Act, 21 U.S.C.section  360bbb-3(b)(1), unless the authorization is terminated  or revoked sooner.       Influenza A by PCR NEGATIVE NEGATIVE   Influenza B by PCR NEGATIVE NEGATIVE    Comment: (NOTE) The Xpert Xpress SARS-CoV-2/FLU/RSV plus assay is intended as an aid in the diagnosis of influenza from Nasopharyngeal swab specimens and should not be used as a sole basis for treatment. Nasal washings and aspirates are unacceptable for Xpert Xpress SARS-CoV-2/FLU/RSV testing.  Fact Sheet for Patients: EntrepreneurPulse.com.au  Fact Sheet for Healthcare Providers: IncredibleEmployment.be  This test is not yet approved or cleared by the [place] FDA and has been authorized for detection and/or diagnosis of SARS-CoV-2 by FDA under an Emergency Use Authorization (EUA). This EUA will remain in effect (meaning this test can be used) for the duration of the COVID-19 declaration under Section 564(b)(1) of the Act, 21 U.S.C. section 360bbb-3(b)(1), unless the authorization is terminated or revoked.     Resp Syncytial Virus by PCR NEGATIVE NEGATIVE    Comment: (NOTE) Fact Sheet for Patients: EntrepreneurPulse.com.au  Fact Sheet for Healthcare Providers: IncredibleEmployment.be  This test is not yet approved or cleared by the [place] FDA and has been authorized for detection and/or diagnosis of SARS-CoV-2 by FDA under an Emergency Use Authorization (EUA). This EUA will remain in effect (meaning this test can be used) for the duration of the COVID-19 declaration under Section 564(b)(1) of the Act, 21 U.S.C. section 360bbb-3(b)(1), unless the authorization is terminated or revoked.  Performed at [facility], [address], [place], [place] [address]     Current Facility-Administered Medications  Medication Dose Route Frequency Provider Last Rate Last Admin   OLANZapine (ZYPREXA) tablet 10 mg  10 mg Oral QHS  Eveline Sauve, Madie Reno, MD       Current Outpatient Medications  Medication Sig Dispense Refill   acetaminophen (TYLENOL) 325 MG tablet Take 1-2 tablets (325-650 mg total) by mouth every 4 (four) hours as needed for mild pain.     albuterol (VENTOLIN HFA) 108 (90 Base) MCG/ACT inhaler Inhale 1-2 puffs into the lungs every 6 (six) hours as needed for wheezing or shortness of breath. 18 g 0   Ascorbic Acid (VITAMIN C PO) Take 1 capsule by mouth daily.     diclofenac Sodium (VOLTAREN) 1 % GEL Apply 2 g topically 4 (four) times daily. 100 g 0   ferrous sulfate 325 (65 FE) MG tablet Take 1 tablet (325 mg total) by mouth 2 (two) times daily with a meal. 60 tablet 0   gabapentin (NEURONTIN) 100 MG capsule Take 3 capsules (300 mg total) by mouth 3 (three) times daily. 90 capsule 3   hydrochlorothiazide (HYDRODIURIL) 12.5 MG tablet Take 1 tablet (12.5 mg total) by mouth daily. 30 tablet 0   methocarbamol (ROBAXIN) 750 MG tablet Take 1 tablet (750 mg total) by mouth 4 (four) times daily. 120 tablet 0   OLANZapine (ZYPREXA) 10 MG tablet Take '10mg'$  by mouth once daily 30 tablet 0   Omega-3 Fatty Acids (FISH OIL PO) Take 1 capsule by mouth daily.     oxyCODONE (ROXICODONE) 5 MG immediate release tablet Take 1 tablet (5 mg total) by mouth every 8 (eight) hours as needed for severe pain. 30 tablet 0   pantoprazole (PROTONIX) 40 MG tablet Take 1 tablet (40 mg total)  by mouth daily. 30 tablet 0   polyethylene glycol (MIRALAX / GLYCOLAX) 17 g packet Take 17 g by mouth daily. 14 each 0   pravastatin (PRAVACHOL) 20 MG tablet Take 1 tablet (20 mg total) by mouth daily. 90 tablet 1   sildenafil (REVATIO) 20 MG tablet TAKE 1 TO 3 TABLETS BY MOUTH ONCE DAILY AS NEEDED FOR ERECTILE DYSFUNCTION 20 tablet 0   Tiotropium Bromide Monohydrate (SPIRIVA RESPIMAT) 2.5 MCG/ACT AERS Inhale 2 puffs into the lungs daily. 4 g 2   traZODone (DESYREL) 100 MG tablet Take 1 tablet (100 mg total) by mouth at bedtime. 30 tablet 0   Vitamin D,  Ergocalciferol, (DRISDOL) 1.25 MG (50000 UNIT) CAPS capsule Take 1 capsule (50,000 Units total) by mouth once a week. 5 capsule 0    Musculoskeletal: Strength & Muscle Tone: decreased Gait & Station: unsteady Patient leans: N/A            Psychiatric Specialty Exam:  Presentation  General Appearance:  Appropriate for Environment  Eye Contact: Good  Speech: Clear and Coherent  Speech Volume: Normal  Handedness:No data recorded  Mood and Affect  Mood: Anxious (In considerable pain)  Affect: Congruent   Thought Process  Thought Processes: Coherent  Descriptions of Associations:Intact  Orientation:Full (Time, Place and Person)  Thought Content:Logical  History of Schizophrenia/Schizoaffective disorder:No data recorded Duration of Psychotic Symptoms:No data recorded Hallucinations:No data recorded Ideas of Reference:None  Suicidal Thoughts:No data recorded Homicidal Thoughts:No data recorded  Sensorium  Memory: Immediate Fair  Judgment: Fair  Insight: Good   Executive Functions  Concentration: Fair  Attention Span: Fair  Recall: [place] of Knowledge: Fair  Language: Fair   Psychomotor Activity  Psychomotor Activity:No data recorded  Assets  Assets: Communication Skills; Desire for Improvement; Financial Resources/Insurance; Housing; Intimacy; Social Support; Resilience   Sleep  Sleep:No data recorded  Physical Exam: Physical Exam Vitals and nursing note reviewed.  Constitutional:      Appearance: He is ill-appearing.  HENT:     Head: Normocephalic and atraumatic.     Mouth/Throat:     Pharynx: Oropharynx is clear.  Eyes:     Pupils: Pupils are equal, round, and reactive to light.  Cardiovascular:     Rate and Rhythm: Normal rate and regular rhythm.  Pulmonary:     Effort: Pulmonary effort is normal.     Breath sounds: Normal breath sounds.  Abdominal:     General: Abdomen is flat.     Palpations:  Abdomen is soft.  Musculoskeletal:        General: Normal range of motion.  Skin:    General: Skin is warm and dry.  Neurological:     General: No focal deficit present.     Mental Status: He is alert. Mental status is at baseline.  Psychiatric:        Attention and Perception: He is inattentive.        Mood and Affect: Mood normal. Affect is blunt.        Speech: Speech is delayed.        Behavior: Behavior is slowed.        Thought Content: Thought content normal. Thought content does not include suicidal ideation.        Cognition and Memory: Memory is impaired.        Judgment: Judgment is inappropriate.    Review of Systems  Constitutional: Negative.   HENT: Negative.    Eyes: Negative.   Respiratory: Negative.  Cardiovascular: Negative.   Gastrointestinal: Negative.   Musculoskeletal: Negative.   Skin: Negative.   Neurological: Negative.   Psychiatric/Behavioral:  Positive for memory loss. Negative for depression, hallucinations, substance abuse and suicidal ideas. The patient is not nervous/anxious and does not have insomnia.    Blood pressure 117/81, pulse 78, resp. rate 14, height '5\' 10"'$  (1.778 m), weight 71 kg, SpO2 100 %. Body mass index is 22.46 kg/m.  Treatment Plan Summary: Medication management and Plan no evidence that the patient was trying to harm himself.  Has consistently stated that he was trying to stop smoking.  Denies any hallucinations denies suicidal ideation.  Currently seems confused delirious and intermittently awake.  Has been cooperative however with medical treatment.  Patient does not meet criteria for IVC.  I have discontinued the involuntary commitment paperwork.  Recommend continued medical evaluation and treatment as already initiated.  I have put in an order for his olanzapine 10 mg at night which has been as far as I can tell his most recent psychiatric medication.  Disposition: No evidence of imminent risk to self or others at present.     Alethia Berthold, MD 01/05/2022 1:44 PM

## 2022-01-10 ENCOUNTER — Ambulatory Visit: Payer: Medicaid Other | Admitting: Physical Therapy

## 2022-01-15 ENCOUNTER — Emergency Department (HOSPITAL_COMMUNITY)
Admission: EM | Admit: 2022-01-15 | Discharge: 2022-01-15 | Disposition: A | Discharge status: Home / Self Care | Payer: Medicaid Other | Attending: Emergency Medicine | Admitting: Emergency Medicine

## 2022-01-15 ENCOUNTER — Emergency Department (HOSPITAL_COMMUNITY): Payer: Medicaid Other

## 2022-01-15 DIAGNOSIS — Y92410 Unspecified street and highway as the place of occurrence of the external cause: Secondary | ICD-10-CM | POA: Insufficient documentation

## 2022-01-15 DIAGNOSIS — S8012XA Contusion of left lower leg, initial encounter: Secondary | ICD-10-CM | POA: Insufficient documentation

## 2022-01-15 DIAGNOSIS — S60512A Abrasion of left hand, initial encounter: Secondary | ICD-10-CM | POA: Insufficient documentation

## 2022-01-15 DIAGNOSIS — S2231XA Fracture of one rib, right side, initial encounter for closed fracture: Secondary | ICD-10-CM | POA: Diagnosis not present

## 2022-01-15 DIAGNOSIS — S299XXA Unspecified injury of thorax, initial encounter: Secondary | ICD-10-CM | POA: Diagnosis present

## 2022-01-15 DIAGNOSIS — R911 Solitary pulmonary nodule: Secondary | ICD-10-CM | POA: Insufficient documentation

## 2022-01-15 LAB — COMPREHENSIVE METABOLIC PANEL
ALT: 14 U/L (ref 0–44)
AST: 19 U/L (ref 15–41)
Albumin: 3.5 g/dL (ref 3.5–5.0)
Alkaline Phosphatase: 52 U/L (ref 38–126)
Anion gap: 7 (ref 5–15)
BUN: 5 mg/dL — ABNORMAL LOW (ref 8–23)
CO2: 25 mmol/L (ref 22–32)
Calcium: 8.7 mg/dL — ABNORMAL LOW (ref 8.9–10.3)
Chloride: 108 mmol/L (ref 98–111)
Creatinine, Ser: 0.67 mg/dL (ref 0.61–1.24)
GFR, Estimated: >60 mL/min (ref >60)
Glucose, Bld: 103 mg/dL — ABNORMAL HIGH (ref 70–99)
Potassium: 3.2 mmol/L — ABNORMAL LOW (ref 3.5–5.1)
Sodium: 140 mmol/L (ref 135–145)
Total Bilirubin: 0.5 mg/dL (ref 0.3–1.2)
Total Protein: 6.3 g/dL — ABNORMAL LOW (ref 6.5–8.1)

## 2022-01-15 LAB — CBC
HCT: 38 % — ABNORMAL LOW (ref 39.0–52.0)
Hemoglobin: 12.6 g/dL — ABNORMAL LOW (ref 13.0–17.0)
MCH: 31.7 pg (ref 26.0–34.0)
MCHC: 33.2 g/dL (ref 30.0–36.0)
MCV: 95.5 fL (ref 80.0–100.0)
Platelets: 293 10*3/uL (ref 150–400)
RBC: 3.98 MIL/uL — ABNORMAL LOW (ref 4.22–5.81)
RDW: 12 % (ref 11.5–15.5)
WBC: 9.3 10*3/uL (ref 4.0–10.5)
nRBC: 0 % (ref 0.0–0.2)

## 2022-01-15 LAB — SAMPLE TO BLOOD BANK

## 2022-01-15 LAB — I-STAT CHEM 8, ED
BUN: 5 mg/dL — ABNORMAL LOW (ref 8–23)
Calcium, Ion: 1.12 mmol/L — ABNORMAL LOW (ref 1.15–1.40)
Chloride: 105 mmol/L (ref 98–111)
Creatinine, Ser: 0.5 mg/dL — ABNORMAL LOW (ref 0.61–1.24)
Glucose, Bld: 100 mg/dL — ABNORMAL HIGH (ref 70–99)
HCT: 35 % — ABNORMAL LOW (ref 39.0–52.0)
Hemoglobin: 11.9 g/dL — ABNORMAL LOW (ref 13.0–17.0)
Potassium: 3.2 mmol/L — ABNORMAL LOW (ref 3.5–5.1)
Sodium: 142 mmol/L (ref 135–145)
TCO2: 22 mmol/L (ref 22–32)

## 2022-01-15 LAB — LACTIC ACID, PLASMA: Lactic Acid, Venous: 1.3 mmol/L (ref 0.5–1.9)

## 2022-01-15 LAB — PROTIME-INR
INR: 1 (ref 0.8–1.2)
Prothrombin Time: 12.9 seconds (ref 11.4–15.2)

## 2022-01-15 LAB — ETHANOL: Alcohol, Ethyl (B): <10 mg/dL (ref <10)

## 2022-01-15 MED ORDER — IBUPROFEN 600 MG PO TABS: 600.0000 mg | ORAL_TABLET | Freq: Three times a day (TID) | ORAL | 0 refills | Status: DC | PRN

## 2022-01-15 MED ORDER — IOHEXOL 350 MG/ML SOLN
75.0000 mL | Freq: Once | INTRAVENOUS | Status: AC | PRN
  Administered 2022-01-15: 75 mL via INTRAVENOUS

## 2022-01-15 MED ORDER — OXYCODONE-ACETAMINOPHEN 5-325 MG PO TABS
1.0000 | ORAL_TABLET | Freq: Once | ORAL | Status: AC
  Administered 2022-01-15: 1 via ORAL
  Filled 2022-01-15: qty 1

## 2022-01-15 MED ORDER — METHOCARBAMOL 500 MG PO TABS: 500.0000 mg | ORAL_TABLET | Freq: Three times a day (TID) | ORAL | 0 refills | Status: DC | PRN
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
## 2022-01-15 NOTE — ED Notes (Signed)
Pt ambulated with ease down the hallway,

## 2022-01-15 NOTE — Progress Notes (Signed)
Chaplain was referred by outgoing Tonia Ghent.  Pt was alert. His fiancee Kieth Brightly was bedside, as was his "Theatre stage manager."  They were most pleasant but had no needs at this time. Rev. Tamsen Snider Pager [phone]

## 2022-01-15 NOTE — ED Notes (Signed)
Pt ambulated without assistance
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
## 2022-01-15 NOTE — Progress Notes (Signed)
Transition of Care [facility]) - CAGE-AID Screening   Patient Details  Name: Richard Vasquez MRN: [id number] Date of Birth: [birth year]  Clinical Narrative:  Patient denies any alcohol or substance abuse, no cigarettes. No need for substance abuse resources at this time.  CAGE-AID Screening:    Have You Ever Felt You Ought to Cut Down on Your Drinking or Drug Use?: No Have People Annoyed You By Critizing Your Drinking Or Drug Use?: No Have You Felt Bad Or Guilty About Your Drinking Or Drug Use?: No Have You Ever Had a Drink or Used Drugs First Thing In The Morning to Steady Your Nerves or to Get Rid of a Hangover?: No CAGE-AID Score: 0  Substance Abuse Education Offered: No
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
## 2022-01-17 ENCOUNTER — Ambulatory Visit: Payer: Medicaid Other | Admitting: Physical Therapy

## 2022-01-18 ENCOUNTER — Ambulatory Visit: Payer: Medicaid Other | Admitting: Family Medicine

## 2022-01-22 ENCOUNTER — Emergency Department
Admission: EM | Admit: 2022-01-22 | Discharge: 2022-01-23 | Disposition: A | Discharge status: Other Acute Inpatient Hospital | Payer: No Typology Code available for payment source | Attending: Emergency Medicine | Admitting: Emergency Medicine

## 2022-01-22 ENCOUNTER — Other Ambulatory Visit: Payer: Self-pay

## 2022-01-22 DIAGNOSIS — R45851 Suicidal ideations: Secondary | ICD-10-CM | POA: Diagnosis not present

## 2022-01-22 DIAGNOSIS — R5383 Other fatigue: Secondary | ICD-10-CM | POA: Diagnosis not present

## 2022-01-22 DIAGNOSIS — F321 Major depressive disorder, single episode, moderate: Secondary | ICD-10-CM | POA: Diagnosis not present

## 2022-01-22 DIAGNOSIS — R410 Disorientation, unspecified: Secondary | ICD-10-CM | POA: Diagnosis present

## 2022-01-22 DIAGNOSIS — F209 Schizophrenia, unspecified: Secondary | ICD-10-CM | POA: Diagnosis not present

## 2022-01-22 DIAGNOSIS — Z1152 Encounter for screening for COVID-19: Secondary | ICD-10-CM | POA: Insufficient documentation

## 2022-01-22 DIAGNOSIS — G8929 Other chronic pain: Secondary | ICD-10-CM | POA: Diagnosis not present

## 2022-01-22 DIAGNOSIS — I10 Essential (primary) hypertension: Secondary | ICD-10-CM | POA: Insufficient documentation

## 2022-01-22 DIAGNOSIS — F29 Unspecified psychosis not due to a substance or known physiological condition: Secondary | ICD-10-CM | POA: Diagnosis present

## 2022-01-22 DIAGNOSIS — F17210 Nicotine dependence, cigarettes, uncomplicated: Secondary | ICD-10-CM | POA: Diagnosis not present

## 2022-01-22 LAB — CBC
HCT: 41.5 % (ref 39.0–52.0)
Hemoglobin: 14.1 g/dL (ref 13.0–17.0)
MCH: 31.2 pg (ref 26.0–34.0)
MCHC: 34 g/dL (ref 30.0–36.0)
MCV: 91.8 fL (ref 80.0–100.0)
Platelets: 306 10*3/uL (ref 150–400)
RBC: 4.52 MIL/uL (ref 4.22–5.81)
RDW: 12.1 % (ref 11.5–15.5)
WBC: 11.3 10*3/uL — ABNORMAL HIGH (ref 4.0–10.5)
nRBC: 0 % (ref 0.0–0.2)

## 2022-01-22 LAB — COMPREHENSIVE METABOLIC PANEL
ALT: 12 U/L (ref 0–44)
AST: 16 U/L (ref 15–41)
Albumin: 4.2 g/dL (ref 3.5–5.0)
Alkaline Phosphatase: 66 U/L (ref 38–126)
Anion gap: 13 (ref 5–15)
BUN: 11 mg/dL (ref 8–23)
CO2: 19 mmol/L — ABNORMAL LOW (ref 22–32)
Calcium: 9.5 mg/dL (ref 8.9–10.3)
Chloride: 106 mmol/L (ref 98–111)
Creatinine, Ser: 0.61 mg/dL (ref 0.61–1.24)
GFR, Estimated: >60 mL/min (ref >60)
Glucose, Bld: 102 mg/dL — ABNORMAL HIGH (ref 70–99)
Potassium: 3.8 mmol/L (ref 3.5–5.1)
Sodium: 138 mmol/L (ref 135–145)
Total Bilirubin: 0.7 mg/dL (ref 0.3–1.2)
Total Protein: 7.5 g/dL (ref 6.5–8.1)

## 2022-01-22 LAB — SALICYLATE LEVEL: Salicylate Lvl: <7 mg/dL — ABNORMAL LOW (ref 7.0–30.0)

## 2022-01-22 LAB — ETHANOL: Alcohol, Ethyl (B): <10 mg/dL (ref <10)

## 2022-01-22 LAB — ACETAMINOPHEN LEVEL: Acetaminophen (Tylenol), Serum: <10 ug/mL — ABNORMAL LOW (ref 10–30)

## 2022-01-22 MED ORDER — ALBUTEROL SULFATE HFA 108 (90 BASE) MCG/ACT IN AERS: 1.0000 | INHALATION_SPRAY | Freq: Four times a day (QID) | RESPIRATORY_TRACT | Status: DC | PRN

## 2022-01-22 MED ORDER — GABAPENTIN 300 MG PO CAPS
300.0000 mg | ORAL_CAPSULE | Freq: Three times a day (TID) | ORAL | Status: DC
  Administered 2022-01-23: 300 mg via ORAL
  Filled 2022-01-22: qty 1

## 2022-01-22 MED ORDER — TRAZODONE HCL 100 MG PO TABS: 100.0000 mg | ORAL_TABLET | Freq: Every day | ORAL | Status: DC

## 2022-01-22 MED ORDER — HALOPERIDOL LACTATE 5 MG/ML IJ SOLN
5.0000 mg | Freq: Once | INTRAMUSCULAR | Status: AC
  Administered 2022-01-22: 5 mg via INTRAMUSCULAR
  Filled 2022-01-22: qty 1

## 2022-01-22 MED ORDER — TIOTROPIUM BROMIDE MONOHYDRATE 18 MCG IN CAPS
1.0000 | ORAL_CAPSULE | Freq: Every day | RESPIRATORY_TRACT | Status: DC
  Filled 2022-01-22: qty 5

## 2022-01-22 MED ORDER — OLANZAPINE 5 MG PO TABS: 5.0000 mg | ORAL_TABLET | Freq: Two times a day (BID) | ORAL | Status: DC

## 2022-01-22 MED ORDER — HYDROCHLOROTHIAZIDE 12.5 MG PO TABS
12.5000 mg | ORAL_TABLET | Freq: Every day | ORAL | Status: DC
  Administered 2022-01-23: 12.5 mg via ORAL
  Filled 2022-01-22: qty 1

## 2022-01-22 MED ORDER — LORAZEPAM 2 MG/ML IJ SOLN
1.0000 mg | Freq: Once | INTRAMUSCULAR | Status: AC
  Administered 2022-01-22: 1 mg via INTRAMUSCULAR
  Filled 2022-01-22: qty 1

## 2022-01-22 MED ORDER — VITAMIN D (ERGOCALCIFEROL) 1.25 MG (50000 UNIT) PO CAPS
50000.0000 [IU] | ORAL_CAPSULE | ORAL | Status: DC
  Filled 2022-01-22: qty 1

## 2022-01-22 MED ORDER — FERROUS SULFATE 325 (65 FE) MG PO TABS
325.0000 mg | ORAL_TABLET | Freq: Two times a day (BID) | ORAL | Status: DC
  Administered 2022-01-23: 325 mg via ORAL
  Filled 2022-01-22: qty 1

## 2022-01-22 MED ORDER — DIPHENHYDRAMINE HCL 50 MG/ML IJ SOLN: 25.0000 mg | Freq: Once | INTRAMUSCULAR | Status: DC

## 2022-01-22 MED ORDER — OLANZAPINE 5 MG PO TABS
5.0000 mg | ORAL_TABLET | Freq: Two times a day (BID) | ORAL | Status: DC
  Administered 2022-01-23: 5 mg via ORAL
  Filled 2022-01-22: qty 1

## 2022-01-22 MED ORDER — PANTOPRAZOLE SODIUM 40 MG PO TBEC
40.0000 mg | DELAYED_RELEASE_TABLET | Freq: Every day | ORAL | Status: DC
  Administered 2022-01-23: 40 mg via ORAL
  Filled 2022-01-22: qty 1

## 2022-01-22 MED ORDER — PRAVASTATIN SODIUM 20 MG PO TABS
20.0000 mg | ORAL_TABLET | Freq: Every day | ORAL | Status: DC
  Administered 2022-01-23: 20 mg via ORAL
  Filled 2022-01-22: qty 1

## 2022-01-22 MED ORDER — DIPHENHYDRAMINE HCL 50 MG/ML IJ SOLN
50.0000 mg | Freq: Once | INTRAMUSCULAR | Status: AC
  Administered 2022-01-22: 50 mg via INTRAMUSCULAR
  Filled 2022-01-22: qty 1

## 2022-01-22 NOTE — ED Notes (Signed)
EDP Isaacs to bedside.  

## 2022-01-22 NOTE — ED Notes (Signed)
Psych team personnel to bedside.

## 2022-01-22 NOTE — ED Notes (Signed)
Pt brought sandwich tray and cup of water back to door and stated "I'm good, I'm fasting". Food left in the room with pt in case he changes his mind.
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
## 2022-01-22 NOTE — ED Notes (Addendum)
Pt kneeling on ground with elbows on bed; pt stated "I'm praying" when asked if he is okay.
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
## 2022-01-22 NOTE — ED Notes (Signed)
Pt gave sandwich tray to staff to be thrown away. Pt ate approximately half of tray. After tray was thrown away pt asked for tray once again.

## 2022-01-22 NOTE — ED Notes (Signed)
Pt asked for tray once again, which he accepted at this time.

## 2022-01-22 NOTE — ED Notes (Signed)
Pt asked for a dinner tray. This Probation officer took pt a cup of water and a sandwich tray and pt stated "I'm good".
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
## 2022-01-22 NOTE — ED Notes (Signed)
EDP completed paperwork for IVC.
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
## 2022-01-22 NOTE — ED Notes (Signed)
Asked pt if he would be willing to take medication to help calm him if doc ordered it as pt intermittently yelling; pt initially stated "yes ma'am" but then immediately after stated "no ma'am". Pt respectful as asked A&O questions; pt currently oriented x4.

## 2022-01-22 NOTE — ED Notes (Addendum)
This Probation officer offered pt food and drink and pt refused at this time.

## 2022-01-22 NOTE — ED Notes (Signed)
Pt once again asked for dinner tray and when this writer tried to hand it to him he once again stated "I'm good".

## 2022-01-23 DIAGNOSIS — F321 Major depressive disorder, single episode, moderate: Secondary | ICD-10-CM | POA: Diagnosis not present

## 2022-01-23 LAB — RESP PANEL BY RT-PCR (RSV, FLU A&B, COVID)  RVPGX2
Influenza A by PCR: NEGATIVE
Influenza B by PCR: NEGATIVE
Resp Syncytial Virus by PCR: NEGATIVE
SARS Coronavirus 2 by RT PCR: NEGATIVE

## 2022-01-23 LAB — URINE DRUG SCREEN, QUALITATIVE (ARMC ONLY)
Amphetamines, Ur Screen: NOT DETECTED
Barbiturates, Ur Screen: NOT DETECTED
Benzodiazepine, Ur Scrn: NOT DETECTED
Cannabinoid 50 Ng, Ur ~~LOC~~: NOT DETECTED
Cocaine Metabolite,Ur ~~LOC~~: NOT DETECTED
MDMA (Ecstasy)Ur Screen: NOT DETECTED
Methadone Scn, Ur: NOT DETECTED
Opiate, Ur Screen: NOT DETECTED
Phencyclidine (PCP) Ur S: NOT DETECTED
Tricyclic, Ur Screen: NOT DETECTED

## 2022-01-23 NOTE — ED Provider Notes (Signed)
Emergency Medicine Observation Re-evaluation Note  Richard Vasquez is a 61 y.o. male, seen on rounds today.  Pt initially presented to the ED for complaints of Suicidal  Currently, the patient is is no acute distress. Denies any concerns at this time.  Physical Exam  Blood pressure (!) 139/96, pulse 98, temperature 97.6 F (36.4 C), resp. rate 19, height 6' (1.829 m), weight 72.6 kg, SpO2 96 %.  Physical Exam: General: No apparent distress Pulm: Normal WOB Neuro: Moving all extremities Psych: Resting comfortably     ED Course / MDM     I have reviewed the labs performed to date as well as medications administered while in observation.  Recent changes in the last 24 hours include: No acute events overnight.  Plan   Current plan: Patient awaiting psychiatric disposition. Patient is not under full IVC at this time.    London Tarnowski, Delice Bison, DO 01/23/22 [phone]

## 2022-01-23 NOTE — BH Assessment (Signed)
PATIENT BED AVAILABLE PENDING UDS AND COVID RESULTS AFTER 3PM  Patient has been accepted to [facility].  Accepting physician is Dr. Myles Lipps.  Call report to [phone].  Representative was [employer].   ER Staff is aware of it:  [employer] ER Secretary  Dr. Beather Arbour, ER MD  Amy Patient's Nurse     BEFORE patient can transfer, COVID results and UDS must be sent and faxed to [phone], call [phone] for any questions

## 2022-01-23 NOTE — ED Notes (Signed)
[facility]  Dept  called  for  transport  to  [facility]

## 2022-01-23 NOTE — ED Notes (Signed)
Pt belongings bag 2/2 sent with girlfriend per patient request

## 2022-01-23 NOTE — BH Assessment (Signed)
Referral information for Psychiatric Hospitalization faxed to;   [facility] ([phone]- [phone]),   [facility] [phone]),  [address] [phone]),   [place] [phone]- [phone]),   [facility] [phone])  [place] [phone] or [phone]),   [facility] [phone]) [phone]
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
## 2022-01-24 ENCOUNTER — Ambulatory Visit: Payer: Medicaid Other | Admitting: Physical Therapy

## 2022-01-29 ENCOUNTER — Ambulatory Visit: Payer: Medicaid Other | Admitting: Family Medicine

## 2022-01-29 ENCOUNTER — Other Ambulatory Visit: Payer: Medicaid Other

## 2022-02-05 ENCOUNTER — Encounter: Payer: Medicaid Other | Attending: Physical Medicine & Rehabilitation | Admitting: Physical Medicine & Rehabilitation

## 2022-02-05 DIAGNOSIS — S82202S Unspecified fracture of shaft of left tibia, sequela: Secondary | ICD-10-CM | POA: Insufficient documentation

## 2022-02-05 DIAGNOSIS — S32592S Other specified fracture of left pubis, sequela: Secondary | ICD-10-CM | POA: Insufficient documentation

## 2022-02-05 DIAGNOSIS — M5432 Sciatica, left side: Secondary | ICD-10-CM | POA: Insufficient documentation

## 2022-02-05 DIAGNOSIS — T07XXXA Unspecified multiple injuries, initial encounter: Secondary | ICD-10-CM | POA: Insufficient documentation

## 2022-02-05 DIAGNOSIS — S32591S Other specified fracture of right pubis, sequela: Secondary | ICD-10-CM | POA: Insufficient documentation

## 2022-02-05 DIAGNOSIS — S82402S Unspecified fracture of shaft of left fibula, sequela: Secondary | ICD-10-CM | POA: Insufficient documentation

## 2022-02-05 DIAGNOSIS — Z720 Tobacco use: Secondary | ICD-10-CM | POA: Insufficient documentation

## 2022-02-10 ENCOUNTER — Inpatient Hospital Stay: Payer: Medicaid Other

## 2022-02-10 ENCOUNTER — Emergency Department: Payer: Medicaid Other

## 2022-02-10 ENCOUNTER — Inpatient Hospital Stay
Admission: EM | Admit: 2022-02-10 | Discharge: 2022-02-12 | DRG: 918 | Disposition: A | Discharge status: Home / Self Care | Payer: Medicaid Other | Attending: Internal Medicine | Admitting: Internal Medicine

## 2022-02-10 ENCOUNTER — Other Ambulatory Visit: Payer: Self-pay

## 2022-02-10 DIAGNOSIS — T68XXXA Hypothermia, initial encounter: Secondary | ICD-10-CM

## 2022-02-10 DIAGNOSIS — N400 Enlarged prostate without lower urinary tract symptoms: Secondary | ICD-10-CM | POA: Diagnosis present

## 2022-02-10 DIAGNOSIS — Z79899 Other long term (current) drug therapy: Secondary | ICD-10-CM

## 2022-02-10 DIAGNOSIS — M5030 Other cervical disc degeneration, unspecified cervical region: Secondary | ICD-10-CM | POA: Diagnosis present

## 2022-02-10 DIAGNOSIS — M1990 Unspecified osteoarthritis, unspecified site: Secondary | ICD-10-CM | POA: Diagnosis present

## 2022-02-10 DIAGNOSIS — J40 Bronchitis, not specified as acute or chronic: Secondary | ICD-10-CM

## 2022-02-10 DIAGNOSIS — T50902A Poisoning by unspecified drugs, medicaments and biological substances, intentional self-harm, initial encounter: Secondary | ICD-10-CM | POA: Diagnosis not present

## 2022-02-10 DIAGNOSIS — F1021 Alcohol dependence, in remission: Secondary | ICD-10-CM | POA: Diagnosis present

## 2022-02-10 DIAGNOSIS — J410 Simple chronic bronchitis: Secondary | ICD-10-CM | POA: Diagnosis present

## 2022-02-10 DIAGNOSIS — T50901A Poisoning by unspecified drugs, medicaments and biological substances, accidental (unintentional), initial encounter: Principal | ICD-10-CM | POA: Diagnosis present

## 2022-02-10 DIAGNOSIS — F17210 Nicotine dependence, cigarettes, uncomplicated: Secondary | ICD-10-CM | POA: Diagnosis present

## 2022-02-10 DIAGNOSIS — B1920 Unspecified viral hepatitis C without hepatic coma: Secondary | ICD-10-CM | POA: Diagnosis present

## 2022-02-10 DIAGNOSIS — F29 Unspecified psychosis not due to a substance or known physiological condition: Secondary | ICD-10-CM | POA: Diagnosis present

## 2022-02-10 DIAGNOSIS — R680 Hypothermia, not associated with low environmental temperature: Secondary | ICD-10-CM | POA: Diagnosis present

## 2022-02-10 DIAGNOSIS — Z833 Family history of diabetes mellitus: Secondary | ICD-10-CM

## 2022-02-10 DIAGNOSIS — Z9660 Presence of unspecified orthopedic joint implant: Secondary | ICD-10-CM | POA: Diagnosis present

## 2022-02-10 DIAGNOSIS — Z801 Family history of malignant neoplasm of trachea, bronchus and lung: Secondary | ICD-10-CM

## 2022-02-10 DIAGNOSIS — F209 Schizophrenia, unspecified: Secondary | ICD-10-CM | POA: Diagnosis present

## 2022-02-10 DIAGNOSIS — G4730 Sleep apnea, unspecified: Secondary | ICD-10-CM | POA: Diagnosis present

## 2022-02-10 DIAGNOSIS — R569 Unspecified convulsions: Secondary | ICD-10-CM | POA: Diagnosis not present

## 2022-02-10 DIAGNOSIS — G8929 Other chronic pain: Secondary | ICD-10-CM | POA: Diagnosis present

## 2022-02-10 DIAGNOSIS — F200 Paranoid schizophrenia: Secondary | ICD-10-CM | POA: Diagnosis present

## 2022-02-10 LAB — URINE DRUG SCREEN, QUALITATIVE (ARMC ONLY)
Amphetamines, Ur Screen: NOT DETECTED
Barbiturates, Ur Screen: NOT DETECTED
Benzodiazepine, Ur Scrn: POSITIVE — AB
Cannabinoid 50 Ng, Ur ~~LOC~~: NOT DETECTED
Cocaine Metabolite,Ur ~~LOC~~: NOT DETECTED
MDMA (Ecstasy)Ur Screen: NOT DETECTED
Methadone Scn, Ur: NOT DETECTED
Opiate, Ur Screen: NOT DETECTED
Phencyclidine (PCP) Ur S: NOT DETECTED
Tricyclic, Ur Screen: POSITIVE — AB

## 2022-02-10 LAB — COMPREHENSIVE METABOLIC PANEL
ALT: 13 U/L (ref 0–44)
AST: 28 U/L (ref 15–41)
Albumin: 3.4 g/dL — ABNORMAL LOW (ref 3.5–5.0)
Alkaline Phosphatase: 71 U/L (ref 38–126)
Anion gap: 13 (ref 5–15)
BUN: 18 mg/dL (ref 8–23)
CO2: 18 mmol/L — ABNORMAL LOW (ref 22–32)
Calcium: 8.7 mg/dL — ABNORMAL LOW (ref 8.9–10.3)
Chloride: 106 mmol/L (ref 98–111)
Creatinine, Ser: 0.82 mg/dL (ref 0.61–1.24)
GFR, Estimated: >60 mL/min (ref >60)
Glucose, Bld: 110 mg/dL — ABNORMAL HIGH (ref 70–99)
Potassium: 3.6 mmol/L (ref 3.5–5.1)
Sodium: 137 mmol/L (ref 135–145)
Total Bilirubin: 0.6 mg/dL (ref 0.3–1.2)
Total Protein: 7 g/dL (ref 6.5–8.1)

## 2022-02-10 LAB — CBC WITH DIFFERENTIAL/PLATELET
Abs Immature Granulocytes: 0.13 10*3/uL — ABNORMAL HIGH (ref 0.00–0.07)
Basophils Absolute: 0.1 10*3/uL (ref 0.0–0.1)
Basophils Relative: 1 %
Eosinophils Absolute: 0.4 10*3/uL (ref 0.0–0.5)
Eosinophils Relative: 4 %
HCT: 39.9 % (ref 39.0–52.0)
Hemoglobin: 13.2 g/dL (ref 13.0–17.0)
Immature Granulocytes: 2 %
Lymphocytes Relative: 24 %
Lymphs Abs: 2.1 10*3/uL (ref 0.7–4.0)
MCH: 31.1 pg (ref 26.0–34.0)
MCHC: 33.1 g/dL (ref 30.0–36.0)
MCV: 93.9 fL (ref 80.0–100.0)
Monocytes Absolute: 0.6 10*3/uL (ref 0.1–1.0)
Monocytes Relative: 6 %
Neutro Abs: 5.7 10*3/uL (ref 1.7–7.7)
Neutrophils Relative %: 63 %
Platelets: 240 10*3/uL (ref 150–400)
RBC: 4.25 MIL/uL (ref 4.22–5.81)
RDW: 12.2 % (ref 11.5–15.5)
WBC: 8.9 10*3/uL (ref 4.0–10.5)
nRBC: 0 % (ref 0.0–0.2)

## 2022-02-10 LAB — URINALYSIS, ROUTINE W REFLEX MICROSCOPIC
Bilirubin Urine: NEGATIVE
Glucose, UA: NEGATIVE mg/dL
Hgb urine dipstick: NEGATIVE
Ketones, ur: NEGATIVE mg/dL
Leukocytes,Ua: NEGATIVE
Nitrite: NEGATIVE
Protein, ur: NEGATIVE mg/dL
Specific Gravity, Urine: 1.009 (ref 1.005–1.030)
pH: 7 (ref 5.0–8.0)

## 2022-02-10 LAB — BLOOD GAS, ARTERIAL
Acid-base deficit: 4.4 mmol/L — ABNORMAL HIGH (ref 0.0–2.0)
Bicarbonate: 22.1 mmol/L (ref 20.0–28.0)
FIO2: 100 %
MECHVT: 475 mL
O2 Saturation: 100 %
PEEP: 5 cmH2O
Patient temperature: 37
RATE: 18 resp/min
pCO2 arterial: 45 mmHg (ref 32–48)
pH, Arterial: 7.3 — ABNORMAL LOW (ref 7.35–7.45)
pO2, Arterial: 353 mmHg — ABNORMAL HIGH (ref 83–108)

## 2022-02-10 LAB — GLUCOSE, CAPILLARY
Glucose-Capillary: 68 mg/dL — ABNORMAL LOW (ref 70–99)
Glucose-Capillary: 71 mg/dL (ref 70–99)
Glucose-Capillary: 72 mg/dL (ref 70–99)
Glucose-Capillary: 78 mg/dL (ref 70–99)
Glucose-Capillary: 80 mg/dL (ref 70–99)
Glucose-Capillary: 81 mg/dL (ref 70–99)

## 2022-02-10 LAB — APTT: aPTT: 28 seconds (ref 24–36)

## 2022-02-10 LAB — MAGNESIUM: Magnesium: 2 mg/dL (ref 1.7–2.4)

## 2022-02-10 LAB — PROTIME-INR
INR: 1.1 (ref 0.8–1.2)
Prothrombin Time: 14.2 seconds (ref 11.4–15.2)

## 2022-02-10 LAB — TROPONIN I (HIGH SENSITIVITY)
Troponin I (High Sensitivity): 4 ng/L (ref <18)
Troponin I (High Sensitivity): 5 ng/L (ref <18)

## 2022-02-10 LAB — PROCALCITONIN: Procalcitonin: <0.1 ng/mL

## 2022-02-10 LAB — ETHANOL: Alcohol, Ethyl (B): <10 mg/dL (ref <10)

## 2022-02-10 LAB — CBG MONITORING, ED: Glucose-Capillary: 116 mg/dL — ABNORMAL HIGH (ref 70–99)

## 2022-02-10 LAB — MRSA NEXT GEN BY PCR, NASAL: MRSA by PCR Next Gen: NOT DETECTED

## 2022-02-10 LAB — LACTIC ACID, PLASMA
Lactic Acid, Venous: 2.1 mmol/L (ref 0.5–1.9)
Lactic Acid, Venous: 1.1 mmol/L (ref 0.5–1.9)

## 2022-02-10 LAB — CK: Total CK: 44 U/L — ABNORMAL LOW (ref 49–397)

## 2022-02-10 LAB — T4, FREE: Free T4: 0.94 ng/dL (ref 0.61–1.12)

## 2022-02-10 LAB — ACETAMINOPHEN LEVEL: Acetaminophen (Tylenol), Serum: <10 ug/mL — ABNORMAL LOW (ref 10–30)

## 2022-02-10 LAB — AMMONIA: Ammonia: 14 umol/L (ref 9–35)

## 2022-02-10 LAB — SALICYLATE LEVEL: Salicylate Lvl: <7 mg/dL — ABNORMAL LOW (ref 7.0–30.0)

## 2022-02-10 LAB — TSH: TSH: 4.81 u[IU]/mL — ABNORMAL HIGH (ref 0.350–4.500)

## 2022-02-10 MED ORDER — POLYETHYLENE GLYCOL 3350 17 G PO PACK: 17.0000 g | PACK | Freq: Every day | ORAL | Status: DC | PRN

## 2022-02-10 MED ORDER — LEVETIRACETAM IN NACL 1500 MG/100ML IV SOLN
1500.0000 mg | Freq: Once | INTRAVENOUS | Status: AC
  Administered 2022-02-10: 1500 mg via INTRAVENOUS
  Filled 2022-02-10: qty 100

## 2022-02-10 MED ORDER — DOCUSATE SODIUM 100 MG PO CAPS: 100.0000 mg | ORAL_CAPSULE | Freq: Two times a day (BID) | ORAL | Status: DC | PRN

## 2022-02-10 MED ORDER — CHLORHEXIDINE GLUCONATE CLOTH 2 % EX PADS
6.0000 | MEDICATED_PAD | Freq: Every day | CUTANEOUS | Status: DC
  Administered 2022-02-10 – 2022-02-11 (×2): 6 via TOPICAL

## 2022-02-10 MED ORDER — PROPOFOL 1000 MG/100ML IV EMUL
5.0000 ug/kg/min | INTRAVENOUS | Status: DC
  Administered 2022-02-10: 30 ug/kg/min via INTRAVENOUS
  Administered 2022-02-11: 40 ug/kg/min via INTRAVENOUS
  Filled 2022-02-10 (×3): qty 100

## 2022-02-10 MED ORDER — ETOMIDATE 2 MG/ML IV SOLN
INTRAVENOUS | Status: AC
  Administered 2022-02-10: 20 mg via INTRAVENOUS
  Filled 2022-02-10: qty 10

## 2022-02-10 MED ORDER — SUCCINYLCHOLINE CHLORIDE 200 MG/10ML IV SOSY
PREFILLED_SYRINGE | INTRAVENOUS | Status: AC
  Administered 2022-02-10: 80 mg via INTRAVENOUS
  Filled 2022-02-10: qty 10

## 2022-02-10 MED ORDER — DEXTROSE-NACL 5-0.45 % IV SOLN: INTRAVENOUS | Status: DC

## 2022-02-10 MED ORDER — ETOMIDATE 2 MG/ML IV SOLN: 20.0000 mg | Freq: Once | INTRAVENOUS | Status: AC

## 2022-02-10 MED ORDER — SODIUM CHLORIDE 0.9 % IV SOLN
2.0000 g | Freq: Once | INTRAVENOUS | Status: AC
  Administered 2022-02-10: 2 g via INTRAVENOUS
  Filled 2022-02-10: qty 12.5

## 2022-02-10 MED ORDER — ORAL CARE MOUTH RINSE
15.0000 mL | OROMUCOSAL | Status: DC
  Administered 2022-02-10 – 2022-02-11 (×10): 15 mL via OROMUCOSAL

## 2022-02-10 MED ORDER — MIDAZOLAM-SODIUM CHLORIDE 100-0.9 MG/100ML-% IV SOLN
0.5000 mg/h | INTRAVENOUS | Status: DC
  Administered 2022-02-10: 0.5 mg/h via INTRAVENOUS
  Administered 2022-02-11: 4 mg/h via INTRAVENOUS
  Filled 2022-02-10 (×2): qty 100

## 2022-02-10 MED ORDER — ORAL CARE MOUTH RINSE
15.0000 mL | OROMUCOSAL | Status: DC | PRN
  Administered 2022-02-10: 15 mL via OROMUCOSAL

## 2022-02-10 MED ORDER — PROPOFOL 1000 MG/100ML IV EMUL
INTRAVENOUS | Status: AC
  Administered 2022-02-10: 5 ug/kg/min via INTRAVENOUS
  Filled 2022-02-10: qty 100

## 2022-02-10 MED ORDER — FAMOTIDINE 20 MG PO TABS
20.0000 mg | ORAL_TABLET | Freq: Two times a day (BID) | ORAL | Status: DC
  Administered 2022-02-10 – 2022-02-11 (×3): 20 mg
  Filled 2022-02-10 (×3): qty 1

## 2022-02-10 MED ORDER — VANCOMYCIN HCL 1500 MG/300ML IV SOLN
1500.0000 mg | Freq: Once | INTRAVENOUS | Status: AC
  Administered 2022-02-10: 1500 mg via INTRAVENOUS
  Filled 2022-02-10: qty 300

## 2022-02-10 MED ORDER — SUCCINYLCHOLINE CHLORIDE 200 MG/10ML IV SOSY: 80.0000 mg | PREFILLED_SYRINGE | Freq: Once | INTRAVENOUS | Status: AC

## 2022-02-10 MED ORDER — SODIUM CHLORIDE 0.9 % IV BOLUS
1000.0000 mL | Freq: Once | INTRAVENOUS | Status: AC
  Administered 2022-02-10: 1000 mL via INTRAVENOUS

## 2022-02-10 MED ORDER — MIDAZOLAM HCL 2 MG/2ML IJ SOLN
5.0000 mg | Freq: Once | INTRAMUSCULAR | Status: AC
  Administered 2022-02-10: 5 mg via INTRAVENOUS
  Filled 2022-02-10: qty 6

## 2022-02-10 MED ORDER — SODIUM CHLORIDE 0.9 % IV SOLN
INTRAVENOUS | Status: DC
  Administered 2022-02-10: 125 mL/h via INTRAVENOUS

## 2022-02-10 NOTE — ED Notes (Signed)
Pt intubated at this time, 23 at the lip, 7.5, + color change.

## 2022-02-10 NOTE — Consult Note (Signed)
PHARMACY -  BRIEF ANTIBIOTIC NOTE   Pharmacy has received consult(s) for vancomycin and cefepime from an ED provider.  The patient's profile has been reviewed for ht/wt/allergies/indication/available labs.    One time order(s) placed for vancomycin 1500 mg IV x1 and cefepime 2g IV x1  Further antibiotics/pharmacy consults should be ordered by admitting physician if indicated.                       Thank you,  Gretel Acre, PharmD PGY1 Pharmacy Resident 02/10/2022 7:02 AM

## 2022-02-10 NOTE — Progress Notes (Signed)
Patient transported to CT and back to ED room on ventilator without incident.

## 2022-02-10 NOTE — ED Triage Notes (Addendum)
Pt arrived via [facility] from home with reports of taking unknown amt of OTC sleep aid, unknown what the active ingredient is in the sleep aid at this time.  EMS reports the overdose was intentional, recently has not had zoloft refilled in 4 days.   Pt recently seen here for psych reasons.    Per EMS, pt had 10-15 full body seizure then had 2 more that lasted 5-10 seconds, Pt received a total of '10mg'$  Versed '5mg'$  IV and IM

## 2022-02-10 NOTE — ED Notes (Signed)
Advised nurse that patient has ready bed 

## 2022-02-10 NOTE — Consult Note (Addendum)
PHARMACY CONSULT NOTE - ELECTROLYTES  Pharmacy Consult for Electrolyte Monitoring and Replacement   Recent Labs: Potassium (mmol/L)  Date Value  02/10/2022 3.6   Magnesium (mg/dL)  Date Value  02/10/2022 2.0   Calcium (mg/dL)  Date Value  02/10/2022 8.7 (L)   Albumin (g/dL)  Date Value  02/10/2022 3.4 (L)  05/09/2020 4.3   Phosphorus (mg/dL)  Date Value  06/14/2021 3.1   Sodium (mmol/L)  Date Value  02/10/2022 137  03/20/2020 137   Corrected Ca: 9.2 mg/dL  Assessment  Richard Vasquez is a 62 y.o. male presenting with seizure like activity. PMH significant for paranoid schizophrenia, HCV, PSA, BPH. Pharmacy has been consulted to monitor and replace electrolytes.  Diet: NPO MIVF: NS @ 125 mL/hr  Goal of Therapy: Electrolytes WNL  Plan:  Potassium: 3.6, no replacement needed Magnesium: 2.0, no replacement needed Check BMP, Mg, Phos with AM labs  Thank you for allowing pharmacy to be a part of this patient's care.  Gretel Acre, PharmD PGY1 Pharmacy Resident 02/10/2022 7:22 AM

## 2022-02-10 NOTE — ED Notes (Signed)
Per Dr. Leonides Schanz after speaking with family via telephone, pt took 2 tabs of an OTC sleep aid earlier in the evening yesterday then about an hour PTA pt took another 2 pills, family member states it was not an overdose and does not think the medication contained benadryl

## 2022-02-10 NOTE — Plan of Care (Signed)
Patient admitted to ICU 02/10/22. Patient remains intubated, sedated; PRVC. No pressors requirement at this time.   Problem: Education: Goal: Knowledge of General Education information will improve Description: Including pain rating scale, medication(s)/side effects and non-pharmacologic comfort measures Outcome: Progressing   Problem: Health Behavior/Discharge Planning: Goal: Ability to manage health-related needs will improve Outcome: Progressing   Problem: Clinical Measurements: Goal: Ability to maintain clinical measurements within normal limits will improve Outcome: Progressing Goal: Will remain free from infection Outcome: Progressing Goal: Diagnostic test results will improve Outcome: Progressing Goal: Respiratory complications will improve Outcome: Progressing Goal: Cardiovascular complication will be avoided Outcome: Progressing   Problem: Activity: Goal: Risk for activity intolerance will decrease Outcome: Progressing   Problem: Nutrition: Goal: Adequate nutrition will be maintained Outcome: Progressing   Problem: Coping: Goal: Level of anxiety will decrease Outcome: Progressing   Problem: Elimination: Goal: Will not experience complications related to bowel motility Outcome: Progressing Goal: Will not experience complications related to urinary retention Outcome: Progressing   Problem: Pain Managment: Goal: General experience of comfort will improve Outcome: Progressing   Problem: Safety: Goal: Ability to remain free from injury will improve Outcome: Progressing   Problem: Skin Integrity: Goal: Risk for impaired skin integrity will decrease Outcome: Progressing   Problem: Education: Goal: Ability to describe self-care measures that may prevent or decrease complications (Diabetes Survival Skills Education) will improve Outcome: Progressing Goal: Individualized Educational Video(s) Outcome: Progressing   Problem: Coping: Goal: Ability to adjust to  condition or change in health will improve Outcome: Progressing   Problem: Fluid Volume: Goal: Ability to maintain a balanced intake and output will improve Outcome: Progressing   Problem: Health Behavior/Discharge Planning: Goal: Ability to identify and utilize available resources and services will improve Outcome: Progressing Goal: Ability to manage health-related needs will improve Outcome: Progressing   Problem: Metabolic: Goal: Ability to maintain appropriate glucose levels will improve Outcome: Progressing   Problem: Nutritional: Goal: Maintenance of adequate nutrition will improve Outcome: Progressing Goal: Progress toward achieving an optimal weight will improve Outcome: Progressing   Problem: Skin Integrity: Goal: Risk for impaired skin integrity will decrease Outcome: Progressing   Problem: Tissue Perfusion: Goal: Adequacy of tissue perfusion will improve Outcome: Progressing

## 2022-02-10 NOTE — H&P (Signed)
CRITICAL CARE     Name: Richard Vasquez MRN: [id number] DOB: 09/16/1960     LOS: 0   SUBJECTIVE FINDINGS & SIGNIFICANT EVENTS    Patient description:   62 yo M with history of schizophrenia, Hep C infection, COPD, alcoholism, illicit drug use and polypharmacy came in after having witnessed seizure.  Fionce at bedside provides details of h/p due to patient being on artificial life support with mechanical ventilation during my initial evaluation. She reports patient is on gabapentin, seroquel, hydraxizine and some other meds from psychiatrist and goes to rehab facility where he gets this prescribed.  He has apparently been off illicit drugs for 503 days as per his Fionce.  She explains he went to [employer] and apparently ate something or took something by mouth then later went down and had seizure. He required endotracheal intubation.   His uDS is + for benzo and TCA. CT head was essentially unremarkable, CT neck with chronic degenerative disc disease.   Lines/tubes : Airway 7.5 mm (Active)  Secured at (cm) 23 cm 02/10/22 0615  Measured From Lips 02/10/22 0615  Secured Location Right 02/10/22 0615  Secured By [employer] 02/10/22 0615  Tube Holder Repositioned Yes 02/10/22 0615  Cuff Pressure (cm H2O) MOV (Manual Technique) 02/10/22 0615     Airway 7.5 mm (Active)  Secured at (cm) 23 cm 02/10/22 0615  Measured From Lips 02/10/22 0615  Secured Location Right 02/10/22 0615  Secured By [employer] 02/10/22 0615  Tube Holder Repositioned Yes 02/10/22 0615     Urethral Catheter Caitlin RN Temperature probe 16 Fr. (Active)  Indication for Insertion or Continuance of Catheter Unstable critically ill patients first 24-48 hours (See Criteria) 02/10/22 0648  Site Assessment Clean, Dry, Intact  02/10/22 0648  Catheter Maintenance Bag below level of bladder;Catheter secured;Drainage bag/tubing not touching floor;Insertion date on drainage bag;Seal intact;No dependent loops 02/10/22 0648  Collection Container Standard drainage bag 02/10/22 5465  Securement Method Securing device (Describe) 02/10/22 0648    Microbiology/Sepsis markers: Results for orders placed or performed during the hospital encounter of 01/22/22  Resp panel by RT-PCR (RSV, Flu A&B, Covid) Anterior Nasal Swab     Status: None   Collection Time: 01/23/22  6:56 AM   Specimen: Anterior Nasal Swab  Result Value Ref Range Status   SARS Coronavirus 2 by RT PCR NEGATIVE NEGATIVE Final    Comment: (NOTE) SARS-CoV-2 target nucleic acids are NOT DETECTED.  The SARS-CoV-2 RNA is generally detectable in upper respiratory specimens during the acute phase of infection. The lowest concentration of SARS-CoV-2 viral copies this assay can detect is 138 copies/mL. A negative result does not preclude SARS-Cov-2 infection and should not be used as the sole basis for treatment or other patient management decisions. A negative result may occur with  improper specimen collection/handling, submission of specimen other than nasopharyngeal swab, presence of viral mutation(s) within the areas targeted by this assay, and inadequate number of viral copies(<138 copies/mL). A negative result must be combined with clinical observations, patient history, and epidemiological information. The expected result is Negative.  Fact Sheet for Patients:  EntrepreneurPulse.com.au  Fact Sheet for Healthcare Providers:  IncredibleEmployment.be  This test is no t yet approved or cleared by the [place] FDA and  has been authorized for detection and/or diagnosis of SARS-CoV-2 by FDA under an Emergency Use Authorization (EUA). This EUA will remain  in effect (meaning this test can be used) for the duration of  the  COVID-19 declaration under Section 564(b)(1) of the Act, 21 U.S.C.section 360bbb-3(b)(1), unless the authorization is terminated  or revoked sooner.       Influenza A by PCR NEGATIVE NEGATIVE Final   Influenza B by PCR NEGATIVE NEGATIVE Final    Comment: (NOTE) The Xpert Xpress SARS-CoV-2/FLU/RSV plus assay is intended as an aid in the diagnosis of influenza from Nasopharyngeal swab specimens and should not be used as a sole basis for treatment. Nasal washings and aspirates are unacceptable for Xpert Xpress SARS-CoV-2/FLU/RSV testing.  Fact Sheet for Patients: EntrepreneurPulse.com.au  Fact Sheet for Healthcare Providers: IncredibleEmployment.be  This test is not yet approved or cleared by the [place] FDA and has been authorized for detection and/or diagnosis of SARS-CoV-2 by FDA under an Emergency Use Authorization (EUA). This EUA will remain in effect (meaning this test can be used) for the duration of the COVID-19 declaration under Section 564(b)(1) of the Act, 21 U.S.C. section 360bbb-3(b)(1), unless the authorization is terminated or revoked.     Resp Syncytial Virus by PCR NEGATIVE NEGATIVE Final    Comment: (NOTE) Fact Sheet for Patients: EntrepreneurPulse.com.au  Fact Sheet for Healthcare Providers: IncredibleEmployment.be  This test is not yet approved or cleared by the [place] FDA and has been authorized for detection and/or diagnosis of SARS-CoV-2 by FDA under an Emergency Use Authorization (EUA). This EUA will remain in effect (meaning this test can be used) for the duration of the COVID-19 declaration under Section 564(b)(1) of the Act, 21 U.S.C. section 360bbb-3(b)(1), unless the authorization is terminated or revoked.  Performed at [facility], [address] [place], [place] [address]     Anti-infectives:  Anti-infectives (From admission, onward)     Start     Dose/Rate Route Frequency Ordered Stop   02/10/22 0715  ceFEPIme (MAXIPIME) 2 g in sodium chloride 0.9 % 100 mL IVPB        2 g 200 mL/hr over 30 Minutes Intravenous  Once 02/10/22 0704 02/10/22 0808   02/10/22 0715  vancomycin (VANCOREADY) IVPB 1500 mg/300 mL        1,500 mg 150 mL/hr over 120 Minutes Intravenous  Once 02/10/22 0704           PAST MEDICAL HISTORY   Past Medical History:  Diagnosis Date   Anxiety    Arthritis    BPH (benign prostatic hyperplasia)    C2 cervical fracture (HCC) 06/12/2018   Chronic pain    Closed displaced supracondylar fracture of distal end of right femur with intracondylar extension ([place]) 06/10/2018   Closed fracture of distal end of right femur ([place]) 06/09/2018   Closed left hip fracture, initial encounter ([place]) 08/18/2018   Depression    Hepatitis C    Paranoid schizophrenia ([place])    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([place])    Sleep apnea      SURGICAL HISTORY   Past Surgical History:  Procedure Laterality Date   COLONOSCOPY WITH PROPOFOL N/A 05/18/2020   Procedure: COLONOSCOPY WITH PROPOFOL;  Surgeon: [name], MD;  Location: [facility];  Service: Endoscopy;  Laterality: N/A;   FRACTURE SURGERY     HIP PINNING,CANNULATED Left 08/18/2018   Procedure: CANNULATED HIP PINNING, Right knee aspiration;  Surgeon: Thornton Park, MD;  Location: [facility];  Service: Orthopedics;  Laterality: Left;   JOINT REPLACEMENT     ORIF FEMUR FRACTURE Right 06/10/2018   Procedure: OPEN REDUCTION INTERNAL FIXATION (ORIF) DISTAL FEMUR FRACTURE;  Surgeon: Shona Needles, MD;  Location: [place];  Service: Orthopedics;  Laterality: Right;   TIBIA IM NAIL INSERTION Left 05/17/2021   Procedure: INTRAMEDULLARY NAILING OF LEFT TIBIA, STRESS EXAM OF PELVIS;  Surgeon: Shona Needles, MD;  Location: [place];  Service: Orthopedics;  Laterality: Left;     FAMILY HISTORY   Family History  Problem Relation Age of Onset   Cancer Mother    Diabetes Mother     Diabetes Paternal Aunt    Lung cancer Paternal Uncle      SOCIAL HISTORY   Social History   Tobacco Use   Smoking status: Every Day    Packs/day: 1.00    Years: 20.00    Total pack years: 20.00    Types: Cigarettes    Last attempt to quit: 02/28/2021    Years since quitting: 0.9   Smokeless tobacco: Never  Vaping Use   Vaping Use: Never used  Substance Use Topics   Alcohol use: Not Currently   Drug use: Not Currently     MEDICATIONS   Current Medication:  Current Facility-Administered Medications:    [COMPLETED] sodium chloride 0.9 % bolus 1,000 mL, 1,000 mL, Intravenous, Once, Last Rate: 999 mL/hr at 02/10/22 0622, 1,000 mL at 02/10/22 0622 **AND** 0.9 %  sodium chloride infusion, , Intravenous, Continuous, Ward, Delice Bison, DO, Last Rate: 125 mL/hr at 02/10/22 0734, New Bag at 02/10/22 0734   docusate sodium (COLACE) capsule 100 mg, 100 mg, Oral, BID PRN, Lang Snow, NP   famotidine (PEPCID) tablet 20 mg, 20 mg, Per Tube, BID, [place], Bing Neighbors, NP   midazolam (VERSED) 100 mg/100 mL (1 mg/mL) premix infusion, 0.5-10 mg/hr, Intravenous, Continuous, Ward, Kristen N, DO, Last Rate: 0.5 mL/hr at 02/10/22 0711, 0.5 mg/hr at 02/10/22 0711   polyethylene glycol (MIRALAX / GLYCOLAX) packet 17 g, 17 g, Oral, Daily PRN, Lang Snow, NP   propofol (DIPRIVAN) 1000 MG/100ML infusion, 5-80 mcg/kg/min, Intravenous, Continuous, Ward, Kristen N, DO, Last Rate: 8.69 mL/hr at 02/10/22 0634, 20 mcg/kg/min at 02/10/22 0634   vancomycin (VANCOREADY) IVPB 1500 mg/300 mL, 1,500 mg, Intravenous, Once, Coulter, [place], [facility]  Current Outpatient Medications:    acetaminophen (TYLENOL) 325 MG tablet, Take 1-2 tablets (325-650 mg total) by mouth every 4 (four) hours as needed for mild pain., Disp: , Rfl:    albuterol (VENTOLIN HFA) 108 (90 Base) MCG/ACT inhaler, Inhale 1-2 puffs into the lungs every 6 (six) hours as needed for wheezing or shortness of breath., Disp: 18 g,  Rfl: 0   Ascorbic Acid (VITAMIN C PO), Take 1 capsule by mouth daily. (Patient not taking: Reported on 01/23/2022), Disp: , Rfl:    diclofenac Sodium (VOLTAREN) 1 % GEL, Apply 2 g topically 4 (four) times daily., Disp: 100 g, Rfl: 0   ferrous sulfate 325 (65 FE) MG tablet, Take 1 tablet (325 mg total) by mouth 2 (two) times daily with a meal. (Patient not taking: Reported on 01/23/2022), Disp: 60 tablet, Rfl: 0   gabapentin (NEURONTIN) 100 MG capsule, Take 3 capsules (300 mg total) by mouth 3 (three) times daily., Disp: 90 capsule, Rfl: 3   hydrochlorothiazide (HYDRODIURIL) 12.5 MG tablet, Take 1 tablet (12.5 mg total) by mouth daily. (Patient not taking: Reported on 01/23/2022), Disp: 30 tablet, Rfl: 0   ibuprofen (ADVIL) 600 MG tablet, Take 1 tablet (600 mg total) by mouth every 8 (eight) hours as needed for moderate pain., Disp: 20 tablet, Rfl: 0   methocarbamol (ROBAXIN) 500 MG tablet, Take 1 tablet (500 mg total) by mouth  every 8 (eight) hours as needed for muscle spasms., Disp: 15 tablet, Rfl: 0   OLANZapine (ZYPREXA) 10 MG tablet, Take '10mg'$  by mouth once daily, Disp: 30 tablet, Rfl: 0   Omega-3 Fatty Acids (FISH OIL PO), Take 1 capsule by mouth daily. (Patient not taking: Reported on 01/23/2022), Disp: , Rfl:    oxyCODONE (ROXICODONE) 5 MG immediate release tablet, Take 1 tablet (5 mg total) by mouth every 8 (eight) hours as needed for severe pain. (Patient not taking: Reported on 01/23/2022), Disp: 30 tablet, Rfl: 0   pantoprazole (PROTONIX) 40 MG tablet, Take 1 tablet (40 mg total) by mouth daily., Disp: 30 tablet, Rfl: 0   polyethylene glycol (MIRALAX / GLYCOLAX) 17 g packet, Take 17 g by mouth daily., Disp: 14 each, Rfl: 0   pravastatin (PRAVACHOL) 20 MG tablet, Take 1 tablet (20 mg total) by mouth daily., Disp: 90 tablet, Rfl: 1   sildenafil (REVATIO) 20 MG tablet, TAKE 1 TO 3 TABLETS BY MOUTH ONCE DAILY AS NEEDED FOR ERECTILE DYSFUNCTION, Disp: 20 tablet, Rfl: 0   Tiotropium Bromide  Monohydrate (SPIRIVA RESPIMAT) 2.5 MCG/ACT AERS, Inhale 2 puffs into the lungs daily. (Patient not taking: Reported on 01/23/2022), Disp: 4 g, Rfl: 2   traZODone (DESYREL) 100 MG tablet, Take 1 tablet (100 mg total) by mouth at bedtime., Disp: 30 tablet, Rfl: 0   Vitamin D, Ergocalciferol, (DRISDOL) 1.25 MG (50000 UNIT) CAPS capsule, Take 1 capsule (50,000 Units total) by mouth once a week. (Patient not taking: Reported on 01/23/2022), Disp: 5 capsule, Rfl: 0    ALLERGIES   Patient has no known allergies.    REVIEW OF SYSTEMS     Unable to obtain due to sedation and mechanical ventilation  PHYSICAL EXAMINATION   Vital Signs: Temp:  [94.3 F (34.6 C)-94.5 F (34.7 C)] 94.5 F (34.7 C) (01/14 0755) Pulse Rate:  [82-105] 82 (01/14 0755) Resp:  [19-24] 21 (01/14 0755) BP: (122-126)/(89-91) 122/91 (01/14 0755) SpO2:  [100 %] 100 % (01/14 0755) FiO2 (%):  [50 %-100 %] 50 % (01/14 0640) Weight:  [72.4 kg] 72.4 kg (01/14 0614)  GENERAL:Age appropriate NAD HEAD: Normocephalic, atraumatic.  EYES: Pupils equal, round, reactive to light.  No scleral icterus.  MOUTH: Moist mucosal membrane. NECK: Supple. No thyromegaly. No nodules. No JVD.  PULMONARY: decreased air entry bilaterally  CARDIOVASCULAR: S1 and S2. Regular rate and rhythm. No murmurs, rubs, or gallops.  GASTROINTESTINAL: Soft, nontender, non-distended. No masses. Positive bowel sounds. No hepatosplenomegaly.  MUSCULOSKELETAL: No swelling, clubbing, or edema.  NEUROLOGIC:GCS 4T SKIN:intact,warm,dry   PERTINENT DATA     Infusions:  sodium chloride 125 mL/hr at 02/10/22 0734   midazolam 0.5 mg/hr (02/10/22 0711)   propofol (DIPRIVAN) infusion 20 mcg/kg/min (02/10/22 7829)   vancomycin     Scheduled Medications:  famotidine  20 mg Per Tube BID   PRN Medications: docusate sodium, polyethylene glycol Hemodynamic parameters:   Intake/Output: No intake/output data recorded.  Ventilator  Settings: Vent Mode:  AC FiO2 (%):  [50 %-100 %] 50 % Set Rate:  [18 bmp] 18 bmp Vt Set:  [475 mL] 475 mL PEEP:  [5 cmH20] 5 cmH20   LAB RESULTS:  Basic Metabolic Panel: Recent Labs  Lab 02/10/22 0617  NA 137  K 3.6  CL 106  CO2 18*  GLUCOSE 110*  BUN 18  CREATININE 0.82  CALCIUM 8.7*  MG 2.0   Liver Function Tests: Recent Labs  Lab 02/10/22 0617  AST 28  ALT 13  ALKPHOS 71  BILITOT 0.6  PROT 7.0  ALBUMIN 3.4*   No results for input(s): "LIPASE", "AMYLASE" in the last 168 hours. Recent Labs  Lab 02/10/22 0743  AMMONIA 14   CBC: Recent Labs  Lab 02/10/22 0617  WBC 8.9  NEUTROABS 5.7  HGB 13.2  HCT 39.9  MCV 93.9  PLT 240   Cardiac Enzymes: Recent Labs  Lab 02/10/22 0617  CKTOTAL 44*   BNP: Invalid input(s): "POCBNP" CBG: Recent Labs  Lab 02/10/22 0606  GLUCAP 116*       IMAGING RESULTS:  Imaging: CT HEAD WO CONTRAST  Result Date: 02/10/2022 CLINICAL DATA:  62 year old male with history of new onset of seizure. Trauma to the head and neck. EXAM: CT HEAD WITHOUT CONTRAST CT CERVICAL SPINE WITHOUT CONTRAST TECHNIQUE: Multidetector CT imaging of the head and cervical spine was performed following the standard protocol without intravenous contrast. Multiplanar CT image reconstructions of the cervical spine were also generated. RADIATION DOSE REDUCTION: This exam was performed according to the departmental dose-optimization program which includes automated exposure control, adjustment of the mA and/or kV according to patient size and/or use of iterative reconstruction technique. COMPARISON:  Head CT 01/15/2022. CT of the cervical spine 05/17/2021. FINDINGS: CT HEAD FINDINGS Brain: Faint physiologic calcification in the right basal ganglia, unchanged. No evidence of acute infarction, hemorrhage, hydrocephalus, extra-axial collection or mass lesion/mass effect. Vascular: No hyperdense vessel or unexpected calcification. Skull: Normal. Negative for fracture or focal lesion.  Sinuses/Orbits: No acute finding. Other: None. CT CERVICAL SPINE FINDINGS Alignment: Normal. Skull base and vertebrae: No acute fracture. No primary bone lesion or focal pathologic process. Soft tissues and spinal canal: No prevertebral fluid or swelling. No visible canal hematoma. Disc levels: Multilevel degenerative disc disease, most severe at C5-C6 and C6-C7. Mild multilevel facet arthropathy bilaterally. Upper chest: Mild pleural-parenchymal thickening in the lung apices bilaterally, similar to prior studies, most compatible with areas of chronic post infectious or inflammatory scarring. Mild paraseptal emphysema. Other: Endotracheal and orogastric tubes partially imaged. IMPRESSION: 1. No evidence of significant acute traumatic injury to the skull, brain or cervical spine. 2. The appearance of the brain is normal. 3. Multilevel degenerative disc disease and cervical spondylosis, similar to prior studies, as above. Electronically Signed   By: Vinnie Langton M.D.   On: 02/10/2022 07:14   CT Cervical Spine Wo Contrast  Result Date: 02/10/2022 CLINICAL DATA:  62 year old male with history of new onset of seizure. Trauma to the head and neck. EXAM: CT HEAD WITHOUT CONTRAST CT CERVICAL SPINE WITHOUT CONTRAST TECHNIQUE: Multidetector CT imaging of the head and cervical spine was performed following the standard protocol without intravenous contrast. Multiplanar CT image reconstructions of the cervical spine were also generated. RADIATION DOSE REDUCTION: This exam was performed according to the departmental dose-optimization program which includes automated exposure control, adjustment of the mA and/or kV according to patient size and/or use of iterative reconstruction technique. COMPARISON:  Head CT 01/15/2022. CT of the cervical spine 05/17/2021. FINDINGS: CT HEAD FINDINGS Brain: Faint physiologic calcification in the right basal ganglia, unchanged. No evidence of acute infarction, hemorrhage, hydrocephalus,  extra-axial collection or mass lesion/mass effect. Vascular: No hyperdense vessel or unexpected calcification. Skull: Normal. Negative for fracture or focal lesion. Sinuses/Orbits: No acute finding. Other: None. CT CERVICAL SPINE FINDINGS Alignment: Normal. Skull base and vertebrae: No acute fracture. No primary bone lesion or focal pathologic process. Soft tissues and spinal canal: No prevertebral fluid or swelling. No visible canal hematoma. Disc levels: Multilevel degenerative  disc disease, most severe at C5-C6 and C6-C7. Mild multilevel facet arthropathy bilaterally. Upper chest: Mild pleural-parenchymal thickening in the lung apices bilaterally, similar to prior studies, most compatible with areas of chronic post infectious or inflammatory scarring. Mild paraseptal emphysema. Other: Endotracheal and orogastric tubes partially imaged. IMPRESSION: 1. No evidence of significant acute traumatic injury to the skull, brain or cervical spine. 2. The appearance of the brain is normal. 3. Multilevel degenerative disc disease and cervical spondylosis, similar to prior studies, as above. Electronically Signed   By: Vinnie Langton M.D.   On: 02/10/2022 07:14   DG Chest 1 View  Result Date: 02/10/2022 CLINICAL DATA:  62 year old male intubated.  Enteric tube placement. EXAM: CHEST  1 VIEW COMPARISON:  CT Chest, Abdomen, and Pelvis 01/15/2022 and earlier. FINDINGS: Portable AP supine view at 0635 hours. Endotracheal tube tip in good position between the level the clavicles and carina. Enteric tube courses to the abdomen and side hole is at the level of the gastric cardia. Mediastinal contours remain normal. Lung volumes are stable. Allowing for portable technique the lungs are clear. Chronic left rib fractures. No acute osseous abnormality identified. Paucity of bowel gas in the visible abdomen. IMPRESSION: 1. Satisfactory endotracheal tube. 2. Enteric tube placed into the stomach with side hole at the gastric cardia,  advance several cm for optimal placement. 3.  No acute cardiopulmonary abnormality. Electronically Signed   By: Genevie Ann M.D.   On: 02/10/2022 06:45   '@PROBHOSP'$ @ CT HEAD WO CONTRAST  Result Date: 02/10/2022 CLINICAL DATA:  62 year old male with history of new onset of seizure. Trauma to the head and neck. EXAM: CT HEAD WITHOUT CONTRAST CT CERVICAL SPINE WITHOUT CONTRAST TECHNIQUE: Multidetector CT imaging of the head and cervical spine was performed following the standard protocol without intravenous contrast. Multiplanar CT image reconstructions of the cervical spine were also generated. RADIATION DOSE REDUCTION: This exam was performed according to the departmental dose-optimization program which includes automated exposure control, adjustment of the mA and/or kV according to patient size and/or use of iterative reconstruction technique. COMPARISON:  Head CT 01/15/2022. CT of the cervical spine 05/17/2021. FINDINGS: CT HEAD FINDINGS Brain: Faint physiologic calcification in the right basal ganglia, unchanged. No evidence of acute infarction, hemorrhage, hydrocephalus, extra-axial collection or mass lesion/mass effect. Vascular: No hyperdense vessel or unexpected calcification. Skull: Normal. Negative for fracture or focal lesion. Sinuses/Orbits: No acute finding. Other: None. CT CERVICAL SPINE FINDINGS Alignment: Normal. Skull base and vertebrae: No acute fracture. No primary bone lesion or focal pathologic process. Soft tissues and spinal canal: No prevertebral fluid or swelling. No visible canal hematoma. Disc levels: Multilevel degenerative disc disease, most severe at C5-C6 and C6-C7. Mild multilevel facet arthropathy bilaterally. Upper chest: Mild pleural-parenchymal thickening in the lung apices bilaterally, similar to prior studies, most compatible with areas of chronic post infectious or inflammatory scarring. Mild paraseptal emphysema. Other: Endotracheal and orogastric tubes partially imaged.  IMPRESSION: 1. No evidence of significant acute traumatic injury to the skull, brain or cervical spine. 2. The appearance of the brain is normal. 3. Multilevel degenerative disc disease and cervical spondylosis, similar to prior studies, as above. Electronically Signed   By: Vinnie Langton M.D.   On: 02/10/2022 07:14   CT Cervical Spine Wo Contrast  Result Date: 02/10/2022 CLINICAL DATA:  62 year old male with history of new onset of seizure. Trauma to the head and neck. EXAM: CT HEAD WITHOUT CONTRAST CT CERVICAL SPINE WITHOUT CONTRAST TECHNIQUE: Multidetector CT imaging of the head  and cervical spine was performed following the standard protocol without intravenous contrast. Multiplanar CT image reconstructions of the cervical spine were also generated. RADIATION DOSE REDUCTION: This exam was performed according to the departmental dose-optimization program which includes automated exposure control, adjustment of the mA and/or kV according to patient size and/or use of iterative reconstruction technique. COMPARISON:  Head CT 01/15/2022. CT of the cervical spine 05/17/2021. FINDINGS: CT HEAD FINDINGS Brain: Faint physiologic calcification in the right basal ganglia, unchanged. No evidence of acute infarction, hemorrhage, hydrocephalus, extra-axial collection or mass lesion/mass effect. Vascular: No hyperdense vessel or unexpected calcification. Skull: Normal. Negative for fracture or focal lesion. Sinuses/Orbits: No acute finding. Other: None. CT CERVICAL SPINE FINDINGS Alignment: Normal. Skull base and vertebrae: No acute fracture. No primary bone lesion or focal pathologic process. Soft tissues and spinal canal: No prevertebral fluid or swelling. No visible canal hematoma. Disc levels: Multilevel degenerative disc disease, most severe at C5-C6 and C6-C7. Mild multilevel facet arthropathy bilaterally. Upper chest: Mild pleural-parenchymal thickening in the lung apices bilaterally, similar to prior studies,  most compatible with areas of chronic post infectious or inflammatory scarring. Mild paraseptal emphysema. Other: Endotracheal and orogastric tubes partially imaged. IMPRESSION: 1. No evidence of significant acute traumatic injury to the skull, brain or cervical spine. 2. The appearance of the brain is normal. 3. Multilevel degenerative disc disease and cervical spondylosis, similar to prior studies, as above. Electronically Signed   By: Vinnie Langton M.D.   On: 02/10/2022 07:14   DG Chest 1 View  Result Date: 02/10/2022 CLINICAL DATA:  62 year old male intubated.  Enteric tube placement. EXAM: CHEST  1 VIEW COMPARISON:  CT Chest, Abdomen, and Pelvis 01/15/2022 and earlier. FINDINGS: Portable AP supine view at 0635 hours. Endotracheal tube tip in good position between the level the clavicles and carina. Enteric tube courses to the abdomen and side hole is at the level of the gastric cardia. Mediastinal contours remain normal. Lung volumes are stable. Allowing for portable technique the lungs are clear. Chronic left rib fractures. No acute osseous abnormality identified. Paucity of bowel gas in the visible abdomen. IMPRESSION: 1. Satisfactory endotracheal tube. 2. Enteric tube placed into the stomach with side hole at the gastric cardia, advance several cm for optimal placement. 3.  No acute cardiopulmonary abnormality. Electronically Signed   By: Genevie Ann M.D.   On: 02/10/2022 06:45     ASSESSMENT AND PLAN    -Multidisciplinary rounds held today  Acute Generalized seizures     - s/p endotracheal intubation for airway protection     - patient is on propofol now with no appreciable seizure activity    - folate , thiamine, IVF,     - labs reviewed and are reassuring  NEUROLOGY - intubated and sedated - minimal sedation to achieve a RASS goal: -1 Wake up assessment pending   ID -continue IV abx as prescibed -follow up cultures  GI/Nutrition GI PROPHYLAXIS as indicated DIET-->TF's as  tolerated Constipation protocol as indicated  ENDO - ICU hypoglycemic\Hyperglycemia protocol -check FSBS per protocol   ELECTROLYTES -follow labs as needed -replace as needed -pharmacy consultation   DVT/GI PRX ordered -SCDs  TRANSFUSIONS AS NEEDED MONITOR FSBS ASSESS the need for LABS as needed    Critical care provider statement:   Total critical care time: 33 minutes   Performed by: Lanney Gins MD   Critical care time was exclusive of separately billable procedures and treating other patients.   Critical care was necessary to treat or prevent  imminent or life-threatening deterioration.   Critical care was time spent personally by me on the following activities: development of treatment plan with patient and/or surrogate as well as nursing, discussions with consultants, evaluation of patient's response to treatment, examination of patient, obtaining history from patient or surrogate, ordering and performing treatments and interventions, ordering and review of laboratory studies, ordering and review of radiographic studies, pulse oximetry and re-evaluation of patient's condition.    Ottie Glazier, M.D.  Pulmonary & Critical Care Medicine

## 2022-02-10 NOTE — ED Provider Notes (Signed)
[facility] Provider Note    Event Date/Time   First MD Initiated Contact with Patient 02/10/22 [phone]     (approximate)   History   Seizures   HPI  Richard Vasquez is a 62 y.o. male history of paranoid schizophrenia, hepatitis C, recovering from substance abuse, BPH who presents to the emergency department EMS for seizure-like activity.  [name] reports patient has had trouble sleeping because his physician recently took him off of trazodone.  He has been taking over-the-counter [employer] sleep aid that she thinks is doxylamine 50 mg tablets for sleep.  She states that he took 2 of these tablets around 10:30 PM last night to go to sleep and then woke up about an hour prior to arrival with difficulty sleeping and took 2 more tablets.  States afterwards his heart started racing so he called 911.  He then became "lethargic" and had seizure-like activity on EMS arrival.  EMS reports he had total of 3 generalized tonic-clonic seizures each lasting 10 to 15 seconds each.  Was postictal and did not come back to baseline between these episodes.  Blood sugar with EMS was 145.  [name] does not think that he took more than 4 total tablets in the past 12 hours of the sleep aid.  She does not think it is Benadryl or melatonin.  She does not think that this was an intentional overdose.  She denies that he has had any history of seizures.  No recent head injury, falls.  Not on blood thinners.  No fevers, vomiting or diarrhea.  Has chronic smoker's cough which is unchanged.  States he has been free of drug and alcohol for 213 days.  She states that he is on Seroquel 200 mg at night, Zyprexa 10 mg at night, Vistaril as needed, gabapentin 100 mg 3 times daily.  States he is on Zoloft but recently ran out of that medication a few days ago.  Patient received 5 mg of IM Versed with EMS and 5 mg of IV Versed.  Now on arrival he is no longer seizing.     History provided by EMS, fianc by  phone.    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)    Sleep apnea     Past Surgical History:  Procedure Laterality Date   COLONOSCOPY WITH PROPOFOL N/A 05/18/2020   Procedure: COLONOSCOPY WITH PROPOFOL;  Surgeon: [name], MD;  Location: [facility] ENDOSCOPY;  Service: Endoscopy;  Laterality: N/A;   FRACTURE SURGERY     HIP PINNING,CANNULATED Left 08/18/2018   Procedure: CANNULATED HIP PINNING, Right knee aspiration;  Surgeon: Thornton Park, MD;  Location: [facility] ORS;  Service: Orthopedics;  Laterality: Left;   JOINT REPLACEMENT     ORIF FEMUR FRACTURE Right 06/10/2018   Procedure: OPEN REDUCTION INTERNAL FIXATION (ORIF) DISTAL FEMUR FRACTURE;  Surgeon: Shona Needles, MD;  Location: [place];  Service: Orthopedics;  Laterality: Right;   TIBIA IM NAIL INSERTION Left 05/17/2021  Procedure: INTRAMEDULLARY NAILING OF LEFT TIBIA, STRESS EXAM OF PELVIS;  Surgeon: Shona Needles, MD;  Location: [place];  Service: Orthopedics;  Laterality: Left;    MEDICATIONS:  Prior to Admission medications   Medication Sig Start Date End Date Taking? Authorizing Provider  acetaminophen (TYLENOL) 325 MG tablet Take 1-2 tablets (325-650 mg total) by mouth every 4 (four) hours as needed for mild pain. 05/29/21   Angiulli, Lavon Paganini, PA-C  albuterol (VENTOLIN HFA) 108 (90 Base) MCG/ACT inhaler Inhale 1-2 puffs into the lungs every 6 (six) hours as needed for wheezing or shortness of breath. 05/29/21   Angiulli, Lavon Paganini, PA-C  Ascorbic Acid (VITAMIN C PO) Take 1  capsule by mouth daily. Patient not taking: Reported on 01/23/2022    [provider]  diclofenac Sodium (VOLTAREN) 1 % GEL Apply 2 g topically 4 (four) times daily. 05/29/21   Angiulli, Lavon Paganini, PA-C  ferrous sulfate 325 (65 FE) MG tablet Take 1 tablet (325 mg total) by mouth 2 (two) times daily with a meal. Patient not taking: Reported on 01/23/2022 05/29/21   Angiulli, Lavon Paganini, PA-C  gabapentin (NEURONTIN) 100 MG capsule Take 3 capsules (300 mg total) by mouth 3 (three) times daily. 11/27/21   Jennye Boroughs, MD  hydrochlorothiazide (HYDRODIURIL) 12.5 MG tablet Take 1 tablet (12.5 mg total) by mouth daily. Patient not taking: Reported on 01/23/2022 05/29/21   Angiulli, Lavon Paganini, PA-C  ibuprofen (ADVIL) 600 MG tablet Take 1 tablet (600 mg total) by mouth every 8 (eight) hours as needed for moderate pain. 01/15/22   Hayden Rasmussen, MD  methocarbamol (ROBAXIN) 500 MG tablet Take 1 tablet (500 mg total) by mouth every 8 (eight) hours as needed for muscle spasms. 01/15/22   Hayden Rasmussen, MD  OLANZapine (ZYPREXA) 10 MG tablet Take '10mg'$  by mouth once daily 06/15/21   Sidney Ace, MD  Omega-3 Fatty Acids (FISH OIL PO) Take 1 capsule by mouth daily. Patient not taking: Reported on 01/23/2022    [provider]  oxyCODONE (ROXICODONE) 5 MG immediate release tablet Take 1 tablet (5 mg total) by mouth every 8 (eight) hours as needed for severe pain. Patient not taking: Reported on 01/23/2022 06/22/21   Jennye Boroughs, MD  pantoprazole (PROTONIX) 40 MG tablet Take 1 tablet (40 mg total) by mouth daily. 12/18/21   Nafziger, Tommi Rumps, NP  polyethylene glycol (MIRALAX / GLYCOLAX) 17 g packet Take 17 g by mouth daily. 05/29/21   Angiulli, Lavon Paganini, PA-C  pravastatin (PRAVACHOL) 20 MG tablet Take 1 tablet (20 mg total) by mouth daily. 12/14/21 06/12/22  Leone Haven, MD  sildenafil (REVATIO) 20 MG tablet TAKE 1 TO 3 TABLETS BY MOUTH ONCE DAILY AS NEEDED FOR ERECTILE DYSFUNCTION  10/31/21   Leone Haven, MD  Tiotropium Bromide Monohydrate (SPIRIVA RESPIMAT) 2.5 MCG/ACT AERS Inhale 2 puffs into the lungs daily. Patient not taking: Reported on 01/23/2022 05/29/21   Angiulli, Lavon Paganini, PA-C  traZODone (DESYREL) 100 MG tablet Take 1 tablet (100 mg total) by mouth at bedtime. 05/29/21   Angiulli, Lavon Paganini, PA-C  Vitamin D, Ergocalciferol, (DRISDOL) 1.25 MG (50000 UNIT) CAPS capsule Take 1 capsule (50,000 Units total) by mouth once a week. Patient not taking: Reported on 01/23/2022 06/01/21   Cathlyn Parsons, PA-C    Physical Exam   Triage Vital Signs: ED Triage Vitals  Enc Vitals Group     BP 02/10/22 0615 126/89     Pulse Rate 02/10/22 0615 Marland Kitchen)  105     Resp 02/10/22 0615 (!) 24     Temp --      Temp src --      SpO2 02/10/22 0615 100 %     Weight 02/10/22 0614 159 lb 9.8 oz (72.4 kg)     Height 02/10/22 0614 6' (1.829 m)     Head Circumference --      Peak Flow --      Pain Score --      Pain Loc --      Pain Edu? --      Excl. in [place]? --     Most recent vital signs: Vitals:   02/10/22 0615  BP: 126/89  Pulse: (!) 105  Resp: (!) 24  SpO2: 100%    CONSTITUTIONAL: Thin, appears older than stated age and chronically ill.  Does not answer questions, open eyes or follow commands.  Will move his upper extremities spontaneously and very intermittently.  Minimal movement noted in the left lower extremity.  GCS 7. HEAD: Normocephalic, atraumatic EYES: Conjunctivae clear, pupils appear equal approximately 4 mm bilaterally and reactive.  Sclera nonicteric. ENT: normal nose; moist mucous membranes NECK: Supple, normal ROM CARD: Regular and tachycardic; S1 and S2 appreciated; no murmurs, no clicks, no rubs, no gallops RESP: Normal chest excursion without splinting or tachypnea; breath sounds clear and equal bilaterally; no wheezes, no rhonchi, no rales, patient was hypoxic on nasal cannula ABD/GI: Normal bowel sounds; non-distended; soft, non-tender, no rebound,  no guarding, no peritoneal signs BACK: The back appears normal EXT: Normal ROM in all joints; no deformity noted, no edema; no cyanosis SKIN: Normal color for age and race; warm; no rash on exposed skin NEURO: GCS 7.  Moves upper extremities intermittently, spontaneously.  Only minimal movement of the left lower extremity.    ED Results / Procedures / Treatments   LABS: (all labs ordered are listed, but only abnormal results are displayed) Labs Reviewed  COMPREHENSIVE METABOLIC PANEL - Abnormal; Notable for the following components:      Result Value   CO2 18 (*)    Glucose, Bld 110 (*)    Calcium 8.7 (*)    Albumin 3.4 (*)    All other components within normal limits  SALICYLATE LEVEL - Abnormal; Notable for the following components:   Salicylate Lvl <4.7 (*)    All other components within normal limits  ACETAMINOPHEN LEVEL - Abnormal; Notable for the following components:   Acetaminophen (Tylenol), Serum <10 (*)    All other components within normal limits  CBC WITH DIFFERENTIAL/PLATELET - Abnormal; Notable for the following components:   Abs Immature Granulocytes 0.13 (*)    All other components within normal limits  BLOOD GAS, ARTERIAL - Abnormal; Notable for the following components:   pH, Arterial 7.3 (*)    pO2, Arterial 353 (*)    Acid-base deficit 4.4 (*)    All other components within normal limits  URINALYSIS, ROUTINE W REFLEX MICROSCOPIC - Abnormal; Notable for the following components:   Color, Urine STRAW (*)    APPearance CLEAR (*)    All other components within normal limits  CK - Abnormal; Notable for the following components:   Total CK 44 (*)    All other components within normal limits  CBG MONITORING, ED - Abnormal; Notable for the following components:   Glucose-Capillary 116 (*)    All other components within normal limits  CULTURE, BLOOD (ROUTINE X 2)  CULTURE, BLOOD (ROUTINE X 2)  URINE CULTURE  ETHANOL  MAGNESIUM  URINE DRUG SCREEN,  QUALITATIVE ([facility] ONLY)  AMMONIA  TSH  T4, FREE  LACTIC ACID, PLASMA  LACTIC ACID, PLASMA  BLOOD GAS, ARTERIAL  PROCALCITONIN  PROTIME-INR  APTT  TROPONIN I (HIGH SENSITIVITY)     EKG:  EKG Interpretation  Date/Time:  Sunday February 10 2022 06:08:23 EST Ventricular Rate:  104 PR Interval:  182 QRS Duration: 87 QT Interval:  347 QTC Calculation: 457 R Axis:   76 Text Interpretation: Sinus tachycardia Anteroseptal infarct, old Confirmed by Pryor Curia [id number]) on 02/10/2022 6:33:45 AM         RADIOLOGY: My personal review and interpretation of imaging: Chest x-ray shows no acute abnormality.  I have personally reviewed all radiology reports.   DG Chest 1 View  Result Date: 02/10/2022 CLINICAL DATA:  62 year old male intubated.  Enteric tube placement. EXAM: CHEST  1 VIEW COMPARISON:  CT Chest, Abdomen, and Pelvis 01/15/2022 and earlier. FINDINGS: Portable AP supine view at 0635 hours. Endotracheal tube tip in good position between the level the clavicles and carina. Enteric tube courses to the abdomen and side hole is at the level of the gastric cardia. Mediastinal contours remain normal. Lung volumes are stable. Allowing for portable technique the lungs are clear. Chronic left rib fractures. No acute osseous abnormality identified. Paucity of bowel gas in the visible abdomen. IMPRESSION: 1. Satisfactory endotracheal tube. 2. Enteric tube placed into the stomach with side hole at the gastric cardia, advance several cm for optimal placement. 3.  No acute cardiopulmonary abnormality. Electronically Signed   By: Genevie Ann M.D.   On: 02/10/2022 06:45     PROCEDURES:  Critical Care performed: Yes, see critical care procedure note(s)   CRITICAL CARE Performed by: Cyril Mourning Karina Lenderman   Total critical care time: 45 minutes  Critical care time was exclusive of separately billable procedures and treating other patients.  Critical care was necessary to treat or prevent imminent or  life-threatening deterioration.  Critical care was time spent personally by me on the following activities: development of treatment plan with patient and/or surrogate as well as nursing, discussions with consultants, evaluation of patient's response to treatment, examination of patient, obtaining history from patient or surrogate, ordering and performing treatments and interventions, ordering and review of laboratory studies, ordering and review of radiographic studies, pulse oximetry and re-evaluation of patient's condition.   Marland Kitchen1-3 Lead EKG Interpretation  Performed by: Kahlee Metivier, Delice Bison, DO Authorized by: Veretta Sabourin, Delice Bison, DO     Interpretation: abnormal     ECG rate:  105   ECG rate assessment: tachycardic     Rhythm: sinus tachycardia     Ectopy: none     Conduction: normal   Procedure Name: Intubation Date/Time: 02/10/2022 6:10 AM  Performed by: Jerra Huckeby, Delice Bison, DOPre-anesthesia Checklist: Emergency Drugs available, Patient identified, Patient being monitored, Timeout performed and Suction available Oxygen Delivery Method: Non-rebreather mask Preoxygenation: Pre-oxygenation with 100% oxygen Induction Type: Rapid sequence Ventilation: Mask ventilation without difficulty Laryngoscope Size: Glidescope and 4 Grade View: Grade I Tube size: 7.5 mm Number of attempts: 1 Placement Confirmation: ETT inserted through vocal cords under direct vision, CO2 detector, Breath sounds checked- equal and bilateral and Positive ETCO2 Secured at: 23 cm Tube secured with: ETT holder Dental Injury: Teeth and Oropharynx as per pre-operative assessment          IMPRESSION / MDM / [place] / ED COURSE  I reviewed the triage vital signs and the nursing  notes.    Patient here with new onset seizures.  The patient is on the cardiac monitor to evaluate for evidence of arrhythmia and/or significant heart rate changes.   DIFFERENTIAL DIAGNOSIS (includes but not limited to):   Epilepsy,  intracranial hemorrhage, mass, stroke, electrolyte derangement, substance abuse, medication side effect   Patient's presentation is most consistent with acute presentation with potential threat to life or bodily function.   PLAN: Will obtain CBC, CMP, ethanol level, urinalysis, urine drug screen, thyroid function panel, CK, Tylenol and salicylate level, CT head and cervical spine, chest x-ray.  Patient intubated for airway protection using etomidate and succinylcholine.  Sedated with propofol.  Will give IV fluids, IV Keppra.  No further seizure-like activity witnessed here.   MEDICATIONS GIVEN IN ED: Medications  sodium chloride 0.9 % bolus 1,000 mL (1,000 mLs Intravenous New Bag/Given 02/10/22 0622)    And  0.9 %  sodium chloride infusion (has no administration in time range)  propofol (DIPRIVAN) 1000 MG/100ML infusion (20 mcg/kg/min  72.4 kg Intravenous Rate/Dose Verify 02/10/22 0634)  levETIRAcetam (KEPPRA) IVPB 1500 mg/ 100 mL premix (has no administration in time range)  midazolam (VERSED) 100 mg/100 mL (1 mg/mL) premix infusion (has no administration in time range)  docusate sodium (COLACE) capsule 100 mg (has no administration in time range)  polyethylene glycol (MIRALAX / GLYCOLAX) packet 17 g (has no administration in time range)  famotidine (PEPCID) tablet 20 mg (has no administration in time range)  ceFEPIme (MAXIPIME) 2 g in sodium chloride 0.9 % 100 mL IVPB (has no administration in time range)  vancomycin (VANCOREADY) IVPB 1500 mg/300 mL (has no administration in time range)  etomidate (AMIDATE) injection 20 mg (20 mg Intravenous Given 02/10/22 0612)  succinylcholine (ANECTINE) syringe 80 mg (80 mg Intravenous Given 02/10/22 0612)  midazolam (VERSED) injection 5 mg (5 mg Intravenous Given 02/10/22 0707)     ED COURSE:  6:37 AM  Pt gagging and biting at tube.  Given some spontaneous movements of his extremities and no further seizure-like activity, low suspicion that he is in  status epilepticus currently.  Will bolus propofol for sedation.  6:54 AM  Pt's blood gas shows a pH of 7.3, pCO2 45, bicarb 22.  pO2 is 353.  Will decrease his FiO2.  Chest x-ray reviewed and interpreted by myself and the radiologist and shows good placement of his endotracheal tube.  Will advance OG.  No pneumonia.  Patient now in CTs.  [name] at bedside.  Patient has had a lot of blue liquid come out of his OG tube.  [name] reports he has been drinking Pedialyte.  Labs show no leukocytosis, normal hemoglobin, normal electrolytes other than bicarb of 18.  Negative alcohol level.  CK is 44.  Troponin negative.  Patient is starting to reach for the to purposely with both arms.  Will continue to bolus propofol and increase as needed but also start Versed infusion.  Temperature initially 95.3.  Placed under [facility].  Given hypothermia, will obtain cultures and give broad-spectrum antibiotics to cover for possible sepsis.  Will add on procalcitonin.  I do not feel lactic will be very useful given seizure-like activity.  7:05 AM  Pt's head CT reviewed and interpreted by myself and the radiologist and shows no acute abnormality.  CONSULTS: Discussed with Rufina Falco, NP with critical care for admission to the ICU.  Family updated at bedside.   OUTSIDE RECORDS REVIEWED: Reviewed last neurosurgery note in September 2020 for cervical spine fracture.  FINAL CLINICAL IMPRESSION(S) / ED DIAGNOSES   Final diagnoses:  Seizure ([place])  Hypothermia, initial encounter     Rx / DC Orders   ED Discharge Orders     None        Note:  This document was prepared using Dragon voice recognition software and may include unintentional dictation errors.   Ashiyah Pavlak, Delice Bison, DO 02/10/22 [phone]

## 2022-02-11 LAB — BASIC METABOLIC PANEL
Anion gap: 5 (ref 5–15)
BUN: 14 mg/dL (ref 8–23)
CO2: 23 mmol/L (ref 22–32)
Calcium: 8.4 mg/dL — ABNORMAL LOW (ref 8.9–10.3)
Chloride: 110 mmol/L (ref 98–111)
Creatinine, Ser: 0.73 mg/dL (ref 0.61–1.24)
GFR, Estimated: >60 mL/min (ref >60)
Glucose, Bld: 81 mg/dL (ref 70–99)
Potassium: 3.9 mmol/L (ref 3.5–5.1)
Sodium: 138 mmol/L (ref 135–145)

## 2022-02-11 LAB — CBC
HCT: 33.9 % — ABNORMAL LOW (ref 39.0–52.0)
Hemoglobin: 11.2 g/dL — ABNORMAL LOW (ref 13.0–17.0)
MCH: 31 pg (ref 26.0–34.0)
MCHC: 33 g/dL (ref 30.0–36.0)
MCV: 93.9 fL (ref 80.0–100.0)
Platelets: 205 10*3/uL (ref 150–400)
RBC: 3.61 MIL/uL — ABNORMAL LOW (ref 4.22–5.81)
RDW: 12.4 % (ref 11.5–15.5)
WBC: 7.8 10*3/uL (ref 4.0–10.5)
nRBC: 0 % (ref 0.0–0.2)

## 2022-02-11 LAB — URINE CULTURE: Culture: NO GROWTH

## 2022-02-11 LAB — GLUCOSE, CAPILLARY
Glucose-Capillary: 111 mg/dL — ABNORMAL HIGH (ref 70–99)
Glucose-Capillary: 63 mg/dL — ABNORMAL LOW (ref 70–99)
Glucose-Capillary: 67 mg/dL — ABNORMAL LOW (ref 70–99)
Glucose-Capillary: 81 mg/dL (ref 70–99)
Glucose-Capillary: 83 mg/dL (ref 70–99)
Glucose-Capillary: 85 mg/dL (ref 70–99)
Glucose-Capillary: 86 mg/dL (ref 70–99)
Glucose-Capillary: 93 mg/dL (ref 70–99)

## 2022-02-11 LAB — TRIGLYCERIDES: Triglycerides: 123 mg/dL (ref <150)

## 2022-02-11 LAB — PHOSPHORUS: Phosphorus: 3.5 mg/dL (ref 2.5–4.6)

## 2022-02-11 LAB — MAGNESIUM: Magnesium: 2.3 mg/dL (ref 1.7–2.4)

## 2022-02-11 MED ORDER — ENOXAPARIN SODIUM 40 MG/0.4ML IJ SOSY
40.0000 mg | PREFILLED_SYRINGE | INTRAMUSCULAR | Status: DC
  Administered 2022-02-11: 40 mg via SUBCUTANEOUS
  Filled 2022-02-11: qty 0.4

## 2022-02-11 MED ORDER — DEXTROSE 50 % IV SOLN
25.0000 mL | Freq: Once | INTRAVENOUS | Status: AC
  Administered 2022-02-11: 25 mL via INTRAVENOUS
  Filled 2022-02-11: qty 50

## 2022-02-11 MED ORDER — OLANZAPINE 10 MG PO TABS
10.0000 mg | ORAL_TABLET | Freq: Every day | ORAL | Status: DC
  Administered 2022-02-11: 10 mg via ORAL
  Filled 2022-02-11: qty 1

## 2022-02-11 MED ORDER — GABAPENTIN 100 MG PO CAPS
100.0000 mg | ORAL_CAPSULE | Freq: Three times a day (TID) | ORAL | Status: DC
Start: 2022-02-11 — End: 2022-02-12
  Administered 2022-02-11 – 2022-02-12 (×3): 100 mg via ORAL
  Filled 2022-02-11 (×3): qty 1

## 2022-02-11 MED ORDER — ONDANSETRON HCL 4 MG/2ML IJ SOLN
4.0000 mg | Freq: Four times a day (QID) | INTRAMUSCULAR | Status: DC
  Administered 2022-02-11: 4 mg via INTRAVENOUS

## 2022-02-11 MED ORDER — ONDANSETRON HCL 4 MG/2ML IJ SOLN: 4.0000 mg | Freq: Four times a day (QID) | INTRAMUSCULAR | Status: DC | PRN

## 2022-02-11 MED ORDER — QUETIAPINE FUMARATE 200 MG PO TABS
200.0000 mg | ORAL_TABLET | Freq: Every day | ORAL | Status: DC
  Administered 2022-02-11: 200 mg via ORAL
  Filled 2022-02-11: qty 8
  Filled 2022-02-11: qty 1

## 2022-02-11 MED ORDER — SERTRALINE HCL 50 MG PO TABS
25.0000 mg | ORAL_TABLET | Freq: Every day | ORAL | Status: DC
  Administered 2022-02-11 – 2022-02-12 (×2): 25 mg via ORAL
  Filled 2022-02-11 (×2): qty 1

## 2022-02-11 MED ORDER — ONDANSETRON HCL 4 MG/2ML IJ SOLN
INTRAMUSCULAR | Status: AC
  Filled 2022-02-11: qty 2

## 2022-02-11 MED ORDER — HYDROXYZINE HCL 25 MG PO TABS: 25.0000 mg | ORAL_TABLET | Freq: Three times a day (TID) | ORAL | Status: DC | PRN

## 2022-02-11 MED ORDER — BUPRENORPHINE HCL-NALOXONE HCL 8-2 MG SL SUBL
0.5000 | SUBLINGUAL_TABLET | Freq: Two times a day (BID) | SUBLINGUAL | Status: DC
  Administered 2022-02-11: 0.5 via SUBLINGUAL
  Filled 2022-02-11 (×2): qty 1

## 2022-02-11 MED ORDER — QUETIAPINE FUMARATE 25 MG PO TABS
50.0000 mg | ORAL_TABLET | Freq: Every day | ORAL | Status: DC
  Administered 2022-02-12: 50 mg via ORAL
  Filled 2022-02-11: qty 2

## 2022-02-11 NOTE — Consult Note (Signed)
[place] Psychiatry Consult   Reason for Consult: Psych team consulted for unintentional drug overdose and management pf psychiatric medications.   Referring Physician:  Donell Beers Patient Identification: Richard Vasquez MRN:  [id number] Principal Diagnosis: Schizophrenia [facility]) Diagnosis:  Principal Problem:   Schizophrenia ([place]) Active Problems:   Intentional drug overdose ([place])   Total Time spent with patient: 45 minutes  Subjective:  Richard Vasquez is a 62 y.o. male patient admitted with unintentional overdose of over the counter sleep aid. Patient states. "I took over the counter sleep medicine with my psych medicine and it didn't do right". Patient denies taking over the counter sleep medication in an attempt to commit suicide".   HPI:  62 year old male with a history of paranoid schizophrenia, major depressive disorder, and anxiety admitted after taking an excessive amount of over-the-counter sleep medication with his regular psychiatric medications, leading to an unintentional overdose and seizure activity necessitating current hospitalization and mechanical intubation. Patient was extubated today in the ICU this morning.   Patient was evaluated today in the ICU with significant other, Penny at the bedside. He was alert and oriented x 4. Speech clear and coherent with normal rate, rhythm and volume. He is currently stable with no acute distress, and appears forward-looking and optimistic about future events. He denies intent to harm himself reporting he was attempting to alleviate insomnia. He denies current depressive symptoms, suicidal or homicidal ideations, and hallucinations. He also denies any past history of suicide attempt. Patient denies current alcohol or drug abuse; reports he has not drank alcohol or used substances in over 200 days. Patient resides in a boarding house with significant other. He is engaged to be married next month, significant other assists with  medication management. He is followed by [facility] where he receives weekly classes and peer counseling services. Patient encouraged to consult with psychiatric provider provider to taking any new over-the-counter medications.   Past Psychiatric History: Per chart review patient has a history of Paranoid schizophrenia, Major Depressive Disorder and Anxiety, and history of substance of abuse (patient reports no use in > 200 days).   Risk to Self:   Risk to Others:   Prior Inpatient Therapy:   Prior Outpatient Therapy:    Past Medical History:  Past Medical History:  Diagnosis Date   Anxiety    Arthritis    BPH (benign prostatic hyperplasia)    C2 cervical fracture ([place]) 06/12/2018   Chronic pain    Closed displaced supracondylar fracture of distal end of right femur with intracondylar extension ([place]) 06/10/2018   Closed fracture of distal end of right femur ([place]) 06/09/2018   Closed left hip fracture, initial encounter ([place]) 08/18/2018   Depression    Hepatitis C    Paranoid schizophrenia ([place])    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[facility])    Sleep apnea     Past Surgical History:  Procedure Laterality Date   COLONOSCOPY WITH PROPOFOL N/A 05/18/2020   Procedure: COLONOSCOPY WITH PROPOFOL;  Surgeon: [name], MD;  Location: [facility] ENDOSCOPY;  Service: Endoscopy;  Laterality: N/A;   FRACTURE SURGERY     HIP PINNING,CANNULATED Left 08/18/2018   Procedure: CANNULATED HIP PINNING, Right knee aspiration;  Surgeon: Thornton Park, MD;  Location: [facility] ORS;  Service: Orthopedics;  Laterality: Left;   JOINT REPLACEMENT     ORIF FEMUR FRACTURE Right 06/10/2018   Procedure: OPEN REDUCTION INTERNAL FIXATION (ORIF) DISTAL FEMUR FRACTURE;  Surgeon: Shona Needles, MD;  Location: [place];  Service: Orthopedics;  Laterality: Right;   TIBIA IM NAIL INSERTION Left 05/17/2021   Procedure: INTRAMEDULLARY NAILING OF LEFT TIBIA, STRESS EXAM OF PELVIS;  Surgeon: Shona Needles, MD;  Location: [facility];  Service:  Orthopedics;  Laterality: Left;   Family History:  Family History  Problem Relation Age of Onset   Cancer Mother    Diabetes Mother    Diabetes Paternal Aunt    Lung cancer Paternal Uncle    Family Psychiatric  History:  Does not know of any family history  Social History:  Social History   Substance and Sexual Activity  Alcohol Use Not Currently     Social History   Substance and Sexual Activity  Drug Use Not Currently    Social History   Socioeconomic History   Marital status: Divorced    Spouse name: Not on file   Number of children: Not on file   Years of education: Not on file   Highest education level: Not on file  Occupational History   Not on file  Tobacco Use   Smoking status: Every Day    Packs/day: 1.00    Years: 20.00    Total pack years: 20.00    Types: Cigarettes    Last attempt to quit: 02/28/2021    Years since quitting: 0.9   Smokeless tobacco: Never  Vaping Use   Vaping Use: Never used  Substance and Sexual Activity   Alcohol use: Not Currently   Drug use: Not Currently   Sexual activity: Not on file  Other Topics Concern   Not on file  Social History Narrative   ** Merged History Encounter **       Social Determinants of Health   Financial Resource Strain: Medium Risk (01/23/2021)   Overall Financial Resource Strain (CARDIA)    Difficulty of Paying Living Expenses: Somewhat hard  Food Insecurity: No Food Insecurity (02/10/2022)   Hunger Vital Sign    Worried About Running Out of Food in the Last Year: Never true    Ran Out of Food in the Last Year: Never true  Transportation Needs: No Transportation Needs (02/10/2022)   PRAPARE - Hydrologist (Medical): No    Lack of Transportation (Non-Medical): No  Physical Activity: Insufficiently Active (11/04/2018)   Exercise Vital Sign    Days of Exercise per Week: 4 days    Minutes of Exercise per Session: 10 min  Stress: Stress Concern Present (06/12/2018)    [place]    Feeling of Stress : Very much  Social Connections: Not on file   Additional Social History:    Allergies:  No Known Allergies  Labs:  Results for orders placed or performed during the hospital encounter of 02/10/22 (from the past 48 hour(s))  CBG monitoring, ED     Status: Abnormal   Collection Time: 02/10/22  6:06 AM  Result Value Ref Range   Glucose-Capillary 116 (H) 70 - 99 mg/dL    Comment: Glucose reference range applies only to samples taken after fasting for at least 8 hours.  Comprehensive metabolic panel     Status: Abnormal   Collection Time: 02/10/22  6:17 AM  Result Value Ref Range   Sodium 137 135 - 145 mmol/L   Potassium 3.6 3.5 - 5.1 mmol/L   Chloride 106 98 - 111 mmol/L   CO2 18 (L) 22 - 32 mmol/L   Glucose, Bld 110 (H) 70 - 99 mg/dL  Comment: Glucose reference range applies only to samples taken after fasting for at least 8 hours.   BUN 18 8 - 23 mg/dL   Creatinine, Ser 0.82 0.61 - 1.24 mg/dL   Calcium 8.7 (L) 8.9 - 10.3 mg/dL   Total Protein 7.0 6.5 - 8.1 g/dL   Albumin 3.4 (L) 3.5 - 5.0 g/dL   AST 28 15 - 41 U/L   ALT 13 0 - 44 U/L   Alkaline Phosphatase 71 38 - 126 U/L   Total Bilirubin 0.6 0.3 - 1.2 mg/dL   GFR, Estimated >60 >60 mL/min    Comment: (NOTE) Calculated using the CKD-EPI Creatinine Equation (2021)    Anion gap 13 5 - 15    Comment: Performed at [facility], [address], [place], [place] [address]  Salicylate level     Status: Abnormal   Collection Time: 02/10/22  6:17 AM  Result Value Ref Range   Salicylate Lvl <4.2 (L) 7.0 - 30.0 mg/dL    Comment: Performed at [facility], [address] [place], [place] [address]  Acetaminophen level     Status: Abnormal   Collection Time: 02/10/22  6:17 AM  Result Value Ref Range   Acetaminophen (Tylenol), Serum <10 (L) 10 - 30 ug/mL    Comment: (NOTE) Therapeutic concentrations vary  significantly. A range of 10-30 ug/mL  may be an effective concentration for many patients. However, some  are best treated at concentrations outside of this range. Acetaminophen concentrations >150 ug/mL at 4 hours after ingestion  and >50 ug/mL at 12 hours after ingestion are often associated with  toxic reactions.  Performed at [facility], [address] [place], [place] [address]   Ethanol     Status: None   Collection Time: 02/10/22  6:17 AM  Result Value Ref Range   Alcohol, Ethyl (B) <10 <10 mg/dL    Comment: (NOTE) Lowest detectable limit for serum alcohol is 10 mg/dL.  For medical purposes only. Performed at [facility], [address] [place], [place] [address]   CBC WITH DIFFERENTIAL     Status: Abnormal   Collection Time: 02/10/22  6:17 AM  Result Value Ref Range   WBC 8.9 4.0 - 10.5 K/uL   RBC 4.25 4.22 - 5.81 MIL/uL   Hemoglobin 13.2 13.0 - 17.0 g/dL   HCT 39.9 39.0 - 52.0 %   MCV 93.9 80.0 - 100.0 fL   MCH 31.1 26.0 - 34.0 pg   MCHC 33.1 30.0 - 36.0 g/dL   RDW 12.2 11.5 - 15.5 %   Platelets 240 150 - 400 K/uL   nRBC 0.0 0.0 - 0.2 %   Neutrophils Relative % 63 %   Neutro Abs 5.7 1.7 - 7.7 K/uL   Lymphocytes Relative 24 %   Lymphs Abs 2.1 0.7 - 4.0 K/uL   Monocytes Relative 6 %   Monocytes Absolute 0.6 0.1 - 1.0 K/uL   Eosinophils Relative 4 %   Eosinophils Absolute 0.4 0.0 - 0.5 K/uL   Basophils Relative 1 %   Basophils Absolute 0.1 0.0 - 0.1 K/uL   Immature Granulocytes 2 %   Abs Immature Granulocytes 0.13 (H) 0.00 - 0.07 K/uL    Comment: Performed at [facility], [address] [place], [place] [address]  Blood gas, arterial     Status: Abnormal   Collection Time: 02/10/22  6:17 AM  Result Value Ref Range   FIO2 100 %   Delivery systems VENTILATOR  Mode ASSIST CONTROL    MECHVT 475 mL   RATE 18 resp/min   PEEP 5 cm H20   pH, Arterial 7.3 (L) 7.35 - 7.45   pCO2 arterial 45 32 - 48 mmHg   pO2, Arterial 353  (H) 83 - 108 mmHg   Bicarbonate 22.1 20.0 - 28.0 mmol/L   Acid-base deficit 4.4 (H) 0.0 - 2.0 mmol/L   O2 Saturation 100 %   Patient temperature 37.0    Collection site RIGHT RADIAL    Allens test (pass/fail) PASS PASS    Comment: Performed at [facility], [place], [place] [address]  Troponin I (High Sensitivity)     Status: None   Collection Time: 02/10/22  6:17 AM  Result Value Ref Range   Troponin I (High Sensitivity) 4 <18 ng/L    Comment: (NOTE) Elevated high sensitivity troponin I (hsTnI) values and significant  changes across serial measurements may suggest ACS but many other  chronic and acute conditions are known to elevate hsTnI results.  Refer to the "Links" section for chest pain algorithms and additional  guidance. Performed at [facility], [address] [place], [place] [address]   CK     Status: Abnormal   Collection Time: 02/10/22  6:17 AM  Result Value Ref Range   Total CK 44 (L) 49 - 397 U/L    Comment: Performed at [facility], [address] [place], [place] [address]  Magnesium     Status: None   Collection Time: 02/10/22  6:17 AM  Result Value Ref Range   Magnesium 2.0 1.7 - 2.4 mg/dL    Comment: Performed at [facility], [address] [place], [place] [address]  TSH     Status: Abnormal   Collection Time: 02/10/22  6:17 AM  Result Value Ref Range   TSH 4.810 (H) 0.350 - 4.500 uIU/mL    Comment: Performed by a 3rd Generation assay with a functional sensitivity of <=0.01 uIU/mL. Performed at [facility], [address] [place], [place] [address]   T4, free     Status: None   Collection Time: 02/10/22  6:17 AM  Result Value Ref Range   Free T4 0.94 0.61 - 1.12 ng/dL    Comment: (NOTE) Biotin ingestion may interfere with free T4 tests. If the results are inconsistent with the TSH level, previous test results, or the clinical presentation, then consider biotin interference. If  needed, order repeat testing after stopping biotin. Performed at [facility], [address] [place], [place] [address]   Urine Drug Screen, Qualitative     Status: Abnormal   Collection Time: 02/10/22  6:21 AM  Result Value Ref Range   Tricyclic, Ur Screen POSITIVE (A) NONE DETECTED   Amphetamines, Ur Screen NONE DETECTED NONE DETECTED   MDMA (Ecstasy)Ur Screen NONE DETECTED NONE DETECTED   Cocaine Metabolite,Ur [place] NONE DETECTED NONE DETECTED   Opiate, Ur Screen NONE DETECTED NONE DETECTED   Phencyclidine (PCP) Ur S NONE DETECTED NONE DETECTED   Cannabinoid 50 Ng, Ur [place] NONE DETECTED NONE DETECTED   Barbiturates, Ur Screen NONE DETECTED NONE DETECTED   Benzodiazepine, Ur Scrn POSITIVE (A) NONE DETECTED   Methadone Scn, Ur NONE DETECTED NONE DETECTED    Comment: (NOTE) Tricyclics + metabolites, urine    Cutoff 1000 ng/mL Amphetamines + metabolites, urine  Cutoff 1000 ng/mL MDMA (Ecstasy), urine              Cutoff  500 ng/mL Cocaine Metabolite, urine          Cutoff 300 ng/mL Opiate + metabolites, urine        Cutoff 300 ng/mL Phencyclidine (PCP), urine         Cutoff 25 ng/mL Cannabinoid, urine                 Cutoff 50 ng/mL Barbiturates + metabolites, urine  Cutoff 200 ng/mL Benzodiazepine, urine              Cutoff 200 ng/mL Methadone, urine                   Cutoff 300 ng/mL  The urine drug screen provides only a preliminary, unconfirmed analytical test result and should not be used for non-medical purposes. Clinical consideration and professional judgment should be applied to any positive drug screen result due to possible interfering substances. A more specific alternate chemical method must be used in order to obtain a confirmed analytical result. Gas chromatography / mass spectrometry (GC/MS) is the preferred confirm atory method. Performed at [facility], [address] [place], [place] [address]   Urinalysis, Routine w reflex microscopic      Status: Abnormal   Collection Time: 02/10/22  6:21 AM  Result Value Ref Range   Color, Urine STRAW (A) YELLOW   APPearance CLEAR (A) CLEAR   Specific Gravity, Urine 1.009 1.005 - 1.030   pH 7.0 5.0 - 8.0   Glucose, UA NEGATIVE NEGATIVE mg/dL   Hgb urine dipstick NEGATIVE NEGATIVE   Bilirubin Urine NEGATIVE NEGATIVE   Ketones, ur NEGATIVE NEGATIVE mg/dL   Protein, ur NEGATIVE NEGATIVE mg/dL   Nitrite NEGATIVE NEGATIVE   Leukocytes,Ua NEGATIVE NEGATIVE    Comment: Performed at [facility], [address] [place], [place] [address]  Urine Culture     Status: None   Collection Time: 02/10/22  6:21 AM   Specimen: Urine, Catheterized  Result Value Ref Range   Specimen Description      URINE, CATHETERIZED Performed at [facility], [address], [place], [place] [address]    Special Requests      NONE Performed at [facility], [address] [place], [place] [address]    Culture      NO GROWTH Performed at [facility], [place] [address], [place], [place] [address]    Report Status 02/11/2022 FINAL   Procalcitonin - Baseline     Status: None   Collection Time: 02/10/22  6:21 AM  Result Value Ref Range   Procalcitonin <0.10 ng/mL    Comment:        Interpretation: PCT (Procalcitonin) <= 0.5 ng/mL: Systemic infection (sepsis) is not likely. Local bacterial infection is possible. (NOTE)       Sepsis PCT Algorithm           Lower Respiratory Tract                                      Infection PCT Algorithm    ----------------------------     ----------------------------         PCT < 0.25 ng/mL                PCT < 0.10 ng/mL          Strongly encourage             Strongly  discourage   discontinuation of antibiotics    initiation of antibiotics    ----------------------------     -----------------------------       PCT 0.25 - 0.50 ng/mL            PCT 0.10 - 0.25 ng/mL               OR       >80% decrease in PCT            Discourage  initiation of                                            antibiotics      Encourage discontinuation           of antibiotics    ----------------------------     -----------------------------         PCT >= 0.50 ng/mL              PCT 0.26 - 0.50 ng/mL               AND        <80% decrease in PCT             Encourage initiation of                                             antibiotics       Encourage continuation           of antibiotics    ----------------------------     -----------------------------        PCT >= 0.50 ng/mL                  PCT > 0.50 ng/mL               AND         increase in PCT                  Strongly encourage                                      initiation of antibiotics    Strongly encourage escalation           of antibiotics                                     -----------------------------                                           PCT <= 0.25 ng/mL                                                 OR                                        >  80% decrease in PCT                                      Discontinue / Do not initiate                                             antibiotics  Performed at [facility], [address] [place], [place] [address]   Lactic acid, plasma     Status: Abnormal   Collection Time: 02/10/22  7:42 AM  Result Value Ref Range   Lactic Acid, Venous 2.1 (HH) 0.5 - 1.9 mmol/L    Comment: CRITICAL RESULT CALLED TO, READ BACK BY AND VERIFIED WITH KATE MCCAFFETY 02/10/22 @ 0813 BY SB Performed at [facility], [address] [place], [place] [address]   Blood culture (routine x 2)     Status: None (Preliminary result)   Collection Time: 02/10/22  7:42 AM   Specimen: BLOOD RIGHT HAND  Result Value Ref Range   Specimen Description BLOOD RIGHT HAND    Special Requests      BOTTLES DRAWN AEROBIC AND ANAEROBIC Blood Culture adequate volume   Culture      NO GROWTH < 24 HOURS Performed at [facility], [address] [place], [place] [address]    Report Status PENDING   Ammonia     Status: None   Collection Time: 02/10/22  7:43 AM  Result Value Ref Range   Ammonia 14 9 - 35 umol/L    Comment: Performed at [facility], [address] [place], [place] [address]  Blood culture (routine x 2)     Status: None (Preliminary result)   Collection Time: 02/10/22  7:55 AM   Specimen: BLOOD  Result Value Ref Range   Specimen Description BLOOD BLOOD LEFT ARM    Special Requests      BOTTLES DRAWN AEROBIC AND ANAEROBIC Blood Culture results may not be optimal due to an inadequate volume of blood received in culture bottles   Culture      NO GROWTH < 24 HOURS Performed at [facility], [address] [place], [place] [address]    Report Status PENDING   Glucose, capillary     Status: Abnormal   Collection Time: 02/10/22  8:32 AM  Result Value Ref Range   Glucose-Capillary 68 (L) 70 - 99 mg/dL    Comment: Glucose reference range applies only to samples taken after fasting for at least 8 hours.  Glucose, capillary     Status: None   Collection Time: 02/10/22  8:34 AM  Result Value Ref Range   Glucose-Capillary 71 70 - 99 mg/dL    Comment: Glucose reference range applies only to samples taken after fasting for at least 8 hours.  MRSA Next Gen by PCR, Nasal     Status: None   Collection Time: 02/10/22  8:47 AM   Specimen: Nasal Mucosa; Nasal Swab  Result Value Ref Range   MRSA by PCR Next Gen NOT DETECTED NOT DETECTED    Comment: (NOTE) The GeneXpert MRSA Assay (FDA approved for NASAL specimens only), is one component of a comprehensive MRSA colonization surveillance program. It is not intended to diagnose MRSA infection nor to guide or monitor treatment for MRSA infections. Test  performance is not FDA approved in patients less than 10 years old. Performed at [facility], [address] [place], [place] [address]   Lactic acid, plasma     Status:  None   Collection Time: 02/10/22  9:22 AM  Result Value Ref Range   Lactic Acid, Venous 1.1 0.5 - 1.9 mmol/L    Comment: Performed at [facility], [address] [place], [place] [address]  Protime-INR     Status: None   Collection Time: 02/10/22  9:22 AM  Result Value Ref Range   Prothrombin Time 14.2 11.4 - 15.2 seconds   INR 1.1 0.8 - 1.2    Comment: (NOTE) INR goal varies based on device and disease states. Performed at [facility], [address] [place], [place] [address]   APTT     Status: None   Collection Time: 02/10/22  9:22 AM  Result Value Ref Range   aPTT 28 24 - 36 seconds    Comment: Performed at [facility], [place], [place] [address]  Troponin I (High Sensitivity)     Status: None   Collection Time: 02/10/22  9:22 AM  Result Value Ref Range   Troponin I (High Sensitivity) 5 <18 ng/L    Comment: (NOTE) Elevated high sensitivity troponin I (hsTnI) values and significant  changes across serial measurements may suggest ACS but many other  chronic and acute conditions are known to elevate hsTnI results.  Refer to the "Links" section for chest pain algorithms and additional  guidance. Performed at [facility], [address] [place], [place] [address]   Glucose, capillary     Status: None   Collection Time: 02/10/22 12:00 PM  Result Value Ref Range   Glucose-Capillary 78 70 - 99 mg/dL    Comment: Glucose reference range applies only to samples taken after fasting for at least 8 hours.  Glucose, capillary     Status: None   Collection Time: 02/10/22  4:31 PM  Result Value Ref Range   Glucose-Capillary 81 70 - 99 mg/dL    Comment: Glucose reference range applies only to samples taken after fasting for at least 8 hours.  Glucose, capillary     Status: None   Collection Time: 02/10/22  9:08 PM  Result Value Ref Range   Glucose-Capillary 72 70 - 99 mg/dL    Comment: Glucose reference range applies only  to samples taken after fasting for at least 8 hours.  Glucose, capillary     Status: None   Collection Time: 02/10/22 11:35 PM  Result Value Ref Range   Glucose-Capillary 80 70 - 99 mg/dL    Comment: Glucose reference range applies only to samples taken after fasting for at least 8 hours.  Glucose, capillary     Status: None   Collection Time: 02/11/22  3:32 AM  Result Value Ref Range   Glucose-Capillary 86 70 - 99 mg/dL    Comment: Glucose reference range applies only to samples taken after fasting for at least 8 hours.  CBC     Status: Abnormal   Collection Time: 02/11/22  4:40 AM  Result Value Ref Range   WBC 7.8 4.0 - 10.5 K/uL   RBC 3.61 (L) 4.22 - 5.81 MIL/uL   Hemoglobin 11.2 (L) 13.0 - 17.0 g/dL   HCT 33.9 (L) 39.0 - 52.0 %   MCV 93.9 80.0 - 100.0 fL   MCH 31.0 26.0 - 34.0 pg   MCHC 33.0  30.0 - 36.0 g/dL   RDW 12.4 11.5 - 15.5 %   Platelets 205 150 - 400 K/uL   nRBC 0.0 0.0 - 0.2 %    Comment: Performed at [facility], [address] [place], [place] [address]  Basic metabolic panel     Status: Abnormal   Collection Time: 02/11/22  4:40 AM  Result Value Ref Range   Sodium 138 135 - 145 mmol/L   Potassium 3.9 3.5 - 5.1 mmol/L   Chloride 110 98 - 111 mmol/L   CO2 23 22 - 32 mmol/L   Glucose, Bld 81 70 - 99 mg/dL    Comment: Glucose reference range applies only to samples taken after fasting for at least 8 hours.   BUN 14 8 - 23 mg/dL   Creatinine, Ser 0.73 0.61 - 1.24 mg/dL   Calcium 8.4 (L) 8.9 - 10.3 mg/dL   GFR, Estimated >60 >60 mL/min    Comment: (NOTE) Calculated using the CKD-EPI Creatinine Equation (2021)    Anion gap 5 5 - 15    Comment: Performed at [facility], [address] [place], [place] [address]  Magnesium     Status: None   Collection Time: 02/11/22  4:40 AM  Result Value Ref Range   Magnesium 2.3 1.7 - 2.4 mg/dL    Comment: Performed at [facility], [address] [place], [place] [address]  Phosphorus      Status: None   Collection Time: 02/11/22  4:40 AM  Result Value Ref Range   Phosphorus 3.5 2.5 - 4.6 mg/dL    Comment: Performed at [facility], [address] [place], [place] [address]  Triglycerides     Status: None   Collection Time: 02/11/22  4:40 AM  Result Value Ref Range   Triglycerides 123 <150 mg/dL    Comment: Performed at [facility], [address] [place], [place] [address]  Glucose, capillary     Status: Abnormal   Collection Time: 02/11/22  7:39 AM  Result Value Ref Range   Glucose-Capillary 63 (L) 70 - 99 mg/dL    Comment: Glucose reference range applies only to samples taken after fasting for at least 8 hours.  Glucose, capillary     Status: Abnormal   Collection Time: 02/11/22  8:07 AM  Result Value Ref Range   Glucose-Capillary 111 (H) 70 - 99 mg/dL    Comment: Glucose reference range applies only to samples taken after fasting for at least 8 hours.  Glucose, capillary     Status: Abnormal   Collection Time: 02/11/22 11:15 AM  Result Value Ref Range   Glucose-Capillary 67 (L) 70 - 99 mg/dL    Comment: Glucose reference range applies only to samples taken after fasting for at least 8 hours.  Glucose, capillary     Status: None   Collection Time: 02/11/22 11:17 AM  Result Value Ref Range   Glucose-Capillary 93 70 - 99 mg/dL    Comment: Glucose reference range applies only to samples taken after fasting for at least 8 hours.  Glucose, capillary     Status: None   Collection Time: 02/11/22  3:44 PM  Result Value Ref Range   Glucose-Capillary 81 70 - 99 mg/dL    Comment: Glucose reference range applies only to samples taken after fasting for at least 8 hours.    Current Facility-Administered Medications  Medication Dose Route Frequency Provider Last Rate Last Admin   buprenorphine-naloxone (SUBOXONE) 8-2 mg per  SL tablet 0.5 tablet  0.5 tablet Sublingual BID Yavier Snider H, NP   0.5 tablet at 02/11/22 1511   Chlorhexidine Gluconate  Cloth 2 % PADS 6 each  6 each Topical Daily Ottie Glazier, MD   6 each at 02/11/22 0658   dextrose 5 %-0.45 % sodium chloride infusion   Intravenous Continuous Lang Snow, NP   Stopped at 02/11/22 1300   docusate sodium (COLACE) capsule 100 mg  100 mg Oral BID PRN Lang Snow, NP       enoxaparin (LOVENOX) injection 40 mg  40 mg Subcutaneous Q24H Dallie Piles, RPH       famotidine (PEPCID) tablet 20 mg  20 mg Per Tube BID Lang Snow, NP   20 mg at 02/10/22 2130   gabapentin (NEURONTIN) capsule 100 mg  100 mg Oral TID Richardson Landry, Lakeith Careaga H, NP   100 mg at 02/11/22 1512   hydrOXYzine (ATARAX) tablet 25 mg  25 mg Oral TID PRN Jayleene Glaeser H, NP       OLANZapine (ZYPREXA) tablet 10 mg  10 mg Oral QHS Temprance Wyre H, NP       ondansetron (ZOFRAN) 4 MG/2ML injection            ondansetron (ZOFRAN) injection 4 mg  4 mg Intravenous Q6H PRN Ottie Glazier, MD       Oral care mouth rinse  15 mL Mouth Rinse PRN Ottie Glazier, MD   15 mL at 02/10/22 1000   polyethylene glycol (MIRALAX / GLYCOLAX) packet 17 g  17 g Oral Daily PRN Lang Snow, NP       QUEtiapine (SEROQUEL) tablet 200 mg  200 mg Oral QHS Sarahelizabeth Conway H, NP       [START ON 02/12/2022] QUEtiapine (SEROQUEL) tablet 50 mg  50 mg Oral Daily Johntay Doolen H, NP       sertraline (ZOLOFT) tablet 25 mg  25 mg Oral Daily Doral Digangi H, NP   25 mg at 02/11/22 1511    Musculoskeletal: Strength & Muscle Tone:  Did not assess Gait & Station:  Did not observe. Patient leans: N/A            Psychiatric Specialty Exam:  Presentation  General Appearance:  Appropriate for Environment  Eye Contact: Good  Speech: Clear and Coherent  Speech Volume: Normal  Handedness: Right   Mood and Affect  Mood: -- (Stated, "good".)  Affect: Appropriate   Thought Process  Thought Processes: Coherent  Descriptions of Associations:Intact  Orientation:Full  (Time, Place and Person)  Thought Content:Logical  History of Schizophrenia/Schizoaffective disorder:Yes  Duration of Psychotic Symptoms:No data recorded Hallucinations:Hallucinations: None  Ideas of Reference:None  Suicidal Thoughts:Suicidal Thoughts: No  Homicidal Thoughts:Homicidal Thoughts: No   Sensorium  Memory: Immediate Good  Judgment: Good  Insight: Good   Executive Functions  Concentration: Good  Attention Span: Good  Recall: Good  Fund of Knowledge: Good  Language: Good   Psychomotor Activity  Psychomotor Activity:Psychomotor Activity: Normal   Assets  Assets: Communication Skills; Desire for Improvement; Housing; Intimacy; Resilience; Social Support   Sleep  Sleep:Sleep: Good   Physical Exam: Physical Exam HENT:     Head: Normocephalic.  Pulmonary:     Effort: No respiratory distress.  Musculoskeletal:        General: Normal range of motion.  Skin:    General: Skin is dry.  Neurological:     Mental Status: He is alert and oriented to person, place, and time.  Review of Systems  Constitutional:  Negative for diaphoresis.  HENT:  Negative for hearing loss.   Respiratory:  Negative for shortness of breath.   Neurological:  Positive for seizures. Negative for tremors.  Psychiatric/Behavioral:  Negative for depression, hallucinations, substance abuse and suicidal ideas.    Blood pressure 115/79, pulse (!) 59, temperature 98.2 F (36.8 C), resp. rate 18, height 6' (1.829 m), weight 67.4 kg, SpO2 100 %. Body mass index is 20.15 kg/m.  Treatment Plan Summary: Medication management and Plan :Will restart current psychiatric medications as prescribed. Psychiatric medications verified by nurse [place] [facility] (Sertraline 25 mg daily, Seroquel 50 mg daily in the morning, Seroquel 200 mg at bedtime, Olanzapine 10 mg at bedtime, Gabapentin 100 mg TID, Suboxone 4 mg BID, and Hydroxyzine 25 mg TID as needed). Reviewed plan with Dr.  Lanney Gins and Donell Beers, NP via secure chat. Encourage patient to consult with psychiatric provider prior to taking any medications. Follow up with outpatient psychiatric care and peer counseling support.      Disposition: No evidence of imminent risk to self or others at present.   Patient does not meet criteria for psychiatric inpatient admission. Supportive therapy provided about ongoing stressors. Discussed crisis plan, support from social network, calling 911, coming to the Emergency Department, and calling Suicide Hotline.   Ronny Flurry, NP 02/11/2022 5:30 PM

## 2022-02-11 NOTE — Consult Note (Addendum)
PHARMACY CONSULT NOTE - ELECTROLYTES  Pharmacy Consult for Electrolyte Monitoring and Replacement   Recent Labs: Potassium (mmol/L)  Date Value  02/11/2022 3.9   Magnesium (mg/dL)  Date Value  02/11/2022 2.3   Calcium (mg/dL)  Date Value  02/11/2022 8.4 (L)   Albumin (g/dL)  Date Value  02/10/2022 3.4 (L)  05/09/2020 4.3   Phosphorus (mg/dL)  Date Value  02/11/2022 3.5   Sodium (mmol/L)  Date Value  02/11/2022 138  03/20/2020 137   Corrected Ca: 9.2 mg/dL  Assessment  Richard Vasquez is a 62 y.o. male presenting with seizure like activity. PMH significant for paranoid schizophrenia, HCV, PSA, BPH. Pharmacy has been consulted to monitor and replace electrolytes.  MIVF: dextrose 5 %-0.45 % sodium chloride  @ 75 mL/hr  Goal of Therapy: Electrolytes WNL  Plan:  No electrolyte replacement warranted for today Recheck electrolytes in am  Thank you for allowing pharmacy to be a part of this patient's care.  Vallery Sa, PharmD, BCPS 02/11/2022 6:57 AM

## 2022-02-11 NOTE — Progress Notes (Signed)
PT Cancellation Note  Patient Details Name: Richard Vasquez MRN: [id number] DOB: 05-16-1960   Cancelled Treatment:    Reason Eval/Treat Not Completed: PT screened, no needs identified, will sign off. Orders received and chart reviewed. Pt independent with mobility this date completing lap around nurses station and asc/desc 8 steps alternating pattern and single rail use returning to bed. Pt with needed 24/7 spouse support. Will rec pt to be followed up with mobility specialists but no acute PT needs identified. PT to sign off.    [name]. Fairly IV, PT, DPT Physical Therapist- [facility]  02/11/2022, 2:05 PM

## 2022-02-11 NOTE — Progress Notes (Addendum)
0811 Extubated to room air. Oxygen saturations 99-100%. Spoke immediately. Confused x 2. Reoriented quickly. [place] room air. No need for oxygen. [place] swallow evaluation done. Patient passed evaluation. 1145 Report called to Sammer RN on 2 C 1200 Informed of move to 206. Awaiting a bed from storage. Patient informed of wait for bed.. Foley d/ced per patient request. 0712 Transferred to room 206.
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
## 2022-02-11 NOTE — Progress Notes (Signed)
Pt extubated without complications, no stridor, sats 99% on roomair

## 2022-02-11 NOTE — Evaluation (Signed)
Occupational Therapy Evaluation Patient Details Name: Richard Vasquez MRN: [id number] DOB: 1960-04-30 Today's Date: 02/11/2022   History of Present Illness 62 yo M with history of schizophrenia, Hep C infection, COPD, alcoholism, illicit drug use and polypharmacy came in after having witnessed seizure. He required endotracheal intubation.   His UDS is + for benzo and TCA   Clinical Impression   [name] was seen for OT evaluation this date. Prior to hospital admission, pt was IND. Pt lives in boarding house with fiancee. Pt currently IND for transfers and in room mobility. SUPERVISION toileting standing and ~100 ft functional mobility - mildly unsteady with pertubations hoever self-corrects with steps. IND standing grooming tasks. SUP grabbing items from ground with good eccentric control. Educated on importance of mobility for functional strengthening, all needs addressed, will sign off. Upon hospital discharge, recommend no OT follow up.   Recommendations for follow up therapy are one component of a multi-disciplinary discharge planning process, led by the attending physician.  Recommendations may be updated based on patient status, additional functional criteria and insurance authorization.   Follow Up Recommendations  No OT follow up     Assistance Recommended at Discharge Set up Supervision/Assistance  Patient can return home with the following Help with stairs or ramp for entrance    Functional Status Assessment  Patient has had a recent decline in their functional status and demonstrates the ability to make significant improvements in function in a reasonable and predictable amount of time.  Equipment Recommendations  None recommended by OT    Recommendations for Other Services       Precautions / Restrictions Precautions Precautions: None Restrictions Weight Bearing Restrictions: No      Mobility Bed Mobility Overal bed mobility: Independent                   Transfers Overall transfer level: Independent                        Balance Overall balance assessment: Mild deficits observed, not formally tested                                         ADL either performed or assessed with clinical judgement   ADL Overall ADL's : Needs assistance/impaired                                       General ADL Comments: SUPERVISION toileting standing and ~100 ft functional mobility - mildly unsteady with pertubations hoever self-corrects with steps. MOD I stanidng grooming tasks. SUP grabbing items from ground with good control      Pertinent Vitals/Pain Pain Assessment Pain Assessment: No/denies pain     Hand Dominance Right   Extremity/Trunk Assessment Upper Extremity Assessment Upper Extremity Assessment: Overall WFL for tasks assessed   Lower Extremity Assessment Lower Extremity Assessment: Overall WFL for tasks assessed       Communication Communication Communication: No difficulties   Cognition Arousal/Alertness: Awake/alert Behavior During Therapy: WFL for tasks assessed/performed Overall Cognitive Status: Within Functional Limits for tasks assessed  Home Living Family/patient expects to be discharged to:: Private residence (boarding house) Living Arrangements: Spouse/significant other Available Help at Discharge: Friend(s);Available 24 hours/day Type of Home: House Home Access: Stairs to enter [employer] of Steps: 1   Home Layout: Two level;Bed/bath upstairs Alternate Level Stairs-Number of Steps: 9 Alternate Level Stairs-Rails: Left Bathroom Shower/Tub: Tub/shower unit             Additional Comments: lives in boarding house with fiancee      Prior Functioning/Environment Prior Level of Function : Independent/Modified Independent                        OT Problem List:  Decreased activity tolerance         OT Goals(Current goals can be found in the care plan section) Acute Rehab OT Goals Patient Stated Goal: to go home OT Goal Formulation: With patient/family Time For Goal Achievement: 02/25/22 Potential to Achieve Goals: Good   AM-PAC OT "6 Clicks" Daily Activity     Outcome Measure Help from another person eating meals?: None Help from another person taking care of personal grooming?: None Help from another person toileting, which includes using toliet, bedpan, or urinal?: None Help from another person bathing (including washing, rinsing, drying)?: A Little Help from another person to put on and taking off regular upper body clothing?: None Help from another person to put on and taking off regular lower body clothing?: None 6 Click Score: 23   End of Session    Activity Tolerance: Patient tolerated treatment well Patient left: Other (comment) (standing in hallway with PT)  OT Visit Diagnosis: Unsteadiness on feet (R26.81)                Time: 1443-1540 OT Time Calculation (min): 9 min Charges:  OT General Charges $OT Visit: 1 Visit OT Evaluation $OT Eval Low Complexity: 1 Low  Dessie Coma, M.S. OTR/L  02/11/22, 2:13 PM  ascom [phone]

## 2022-02-11 NOTE — Progress Notes (Signed)
CRITICAL CARE     Name: Richard Vasquez MRN: [id number] DOB: Mar 16, 1960     LOS: 1   SUBJECTIVE FINDINGS & SIGNIFICANT EVENTS    Patient description:   62 yo M with history of schizophrenia, Hep C infection, COPD, alcoholism, illicit drug use and polypharmacy came in after having witnessed seizure.  Fionce at bedside provides details of h/p due to patient being on artificial life support with mechanical ventilation during my initial evaluation. She reports patient is on gabapentin, seroquel, hydraxizine and some other meds from psychiatrist and goes to rehab facility where he gets this prescribed.  He has apparently been off illicit drugs for 151 days as per his Fionce.  She explains he went to [employer] and apparently ate something or took something by mouth then later went down and had seizure. He required endotracheal intubation.   His uDS is + for benzo and TCA. CT head was essentially unremarkable, CT neck with chronic degenerative disc disease.   02/11/22- patient is s/p extubation from MV this morning.  He is being optimized for TRH transfer.   Lines/tubes : Airway 7.5 mm (Active)  Secured at (cm) 23 cm 02/10/22 0615  Measured From Lips 02/10/22 0615  Secured Location Right 02/10/22 0615  Secured By [employer] 02/10/22 0615  Tube Holder Repositioned Yes 02/10/22 0615  Cuff Pressure (cm H2O) MOV (Manual Technique) 02/10/22 0615     Airway 7.5 mm (Active)  Secured at (cm) 23 cm 02/10/22 0615  Measured From Lips 02/10/22 0615  Secured Location Right 02/10/22 0615  Secured By [employer] 02/10/22 0615  Tube Holder Repositioned Yes 02/10/22 0615     Urethral Catheter Caitlin RN Temperature probe 16 Fr. (Active)  Indication for Insertion or Continuance of Catheter Unstable  critically ill patients first 24-48 hours (See Criteria) 02/10/22 0648  Site Assessment Clean, Dry, Intact 02/10/22 0648  Catheter Maintenance Bag below level of bladder;Catheter secured;Drainage bag/tubing not touching floor;Insertion date on drainage bag;Seal intact;No dependent loops 02/10/22 0648  Collection Container Standard drainage bag 02/10/22 7616  Securement Method Securing device (Describe) 02/10/22 0648    Microbiology/Sepsis markers: Results for orders placed or performed during the hospital encounter of 02/10/22  Blood culture (routine x 2)     Status: None (Preliminary result)   Collection Time: 02/10/22  7:42 AM   Specimen: BLOOD RIGHT HAND  Result Value Ref Range Status   Specimen Description BLOOD RIGHT HAND  Final   Special Requests   Final    BOTTLES DRAWN AEROBIC AND ANAEROBIC Blood Culture adequate volume   Culture   Final    NO GROWTH < 24 HOURS Performed at [facility], [address], [place], [place] [address]    Report Status PENDING  Incomplete  Blood culture (routine x 2)     Status: None (Preliminary result)   Collection Time: 02/10/22  7:55 AM   Specimen: BLOOD  Result Value Ref Range Status   Specimen Description BLOOD BLOOD LEFT ARM  Final   Special Requests   Final    BOTTLES DRAWN AEROBIC AND ANAEROBIC Blood Culture results may not be optimal due to an inadequate volume of blood received in culture bottles   Culture   Final    NO GROWTH < 24 HOURS Performed at [facility], [address] [place], [place] [address]    Report Status PENDING  Incomplete  MRSA Next Gen by PCR, Nasal     Status: None  Collection Time: 02/10/22  8:47 AM   Specimen: Nasal Mucosa; Nasal Swab  Result Value Ref Range Status   MRSA by PCR Next Gen NOT DETECTED NOT DETECTED Final    Comment: (NOTE) The GeneXpert MRSA Assay (FDA approved for NASAL specimens only), is one component of a comprehensive MRSA colonization surveillance program. It is  not intended to diagnose MRSA infection nor to guide or monitor treatment for MRSA infections. Test performance is not FDA approved in patients less than 69 years old. Performed at [facility], [address], [place], [place] [address]     Anti-infectives:  Anti-infectives (From admission, onward)    Start     Dose/Rate Route Frequency Ordered Stop   02/10/22 0715  ceFEPIme (MAXIPIME) 2 g in sodium chloride 0.9 % 100 mL IVPB        2 g 200 mL/hr over 30 Minutes Intravenous  Once 02/10/22 0704 02/10/22 0808   02/10/22 0715  vancomycin (VANCOREADY) IVPB 1500 mg/300 mL        1,500 mg 150 mL/hr over 120 Minutes Intravenous  Once 02/10/22 0704 02/10/22 1027         PAST MEDICAL HISTORY   Past Medical History:  Diagnosis Date   Anxiety    Arthritis    BPH (benign prostatic hyperplasia)    C2 cervical fracture (HCC) 06/12/2018   Chronic pain    Closed displaced supracondylar fracture of distal end of right femur with intracondylar extension ([place]) 06/10/2018   Closed fracture of distal end of right femur ([place]) 06/09/2018   Closed left hip fracture, initial encounter ([place]) 08/18/2018   Depression    Hepatitis C    Paranoid schizophrenia ([place])    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([place])    Sleep apnea      SURGICAL HISTORY   Past Surgical History:  Procedure Laterality Date   COLONOSCOPY WITH PROPOFOL N/A 05/18/2020   Procedure: COLONOSCOPY WITH PROPOFOL;  Surgeon: [name], MD;  Location: [facility] END[facility];  Service: Endoscopy;  Laterality: N/A;   FRACTURE SURGERY     HIP PINNING,CANNULATED Left 08/18/2018   Procedure: CANNULATED HIP PINNING, Right knee aspiration;  Surgeon: Thornton Park, MD;  Location: [facility] ORS;  Service: Orthopedics;  Laterality: Left;   JOINT REPLACEMENT     ORIF FEMUR FRACTURE Right 06/10/2018   Procedure: OPEN REDUCTION INTERNAL FIXATION (ORIF) DISTAL FEMUR FRACTURE;  Surgeon: Shona Needles, MD;  Location: [facility];  Service: Orthopedics;   Laterality: Right;   TIBIA IM NAIL INSERTION Left 05/17/2021   Procedure: INTRAMEDULLARY NAILING OF LEFT TIBIA, STRESS EXAM OF PELVIS;  Surgeon: Shona Needles, MD;  Location: [facility];  Service: Orthopedics;  Laterality: Left;     FAMILY HISTORY   Family History  Problem Relation Age of Onset   Cancer Mother    Diabetes Mother    Diabetes Paternal Aunt    Lung cancer Paternal Uncle      SOCIAL HISTORY   Social History   Tobacco Use   Smoking status: Every Day    Packs/day: 1.00    Years: 20.00    Total pack years: 20.00    Types: Cigarettes    Last attempt to quit: 02/28/2021    Years since quitting: 0.9   Smokeless tobacco: Never  Vaping Use   Vaping Use: Never used  Substance Use Topics   Alcohol use: Not Currently   Drug use: Not Currently     MEDICATIONS   Current Medication:  Current Facility-Administered Medications:  Chlorhexidine Gluconate Cloth 2 % PADS 6 each, 6 each, Topical, Daily, Ottie Glazier, MD, 6 each at 02/11/22 0658   dextrose 5 %-0.45 % sodium chloride infusion, , Intravenous, Continuous, [place], Bing Neighbors, NP, Last Rate: 75 mL/hr at 02/11/22 0600, Infusion Verify at 02/11/22 0600   docusate sodium (COLACE) capsule 100 mg, 100 mg, Oral, BID PRN, Lang Snow, NP   famotidine (PEPCID) tablet 20 mg, 20 mg, Per Tube, BID, Lang Snow, NP, 20 mg at 02/10/22 2130   midazolam (VERSED) 100 mg/100 mL (1 mg/mL) premix infusion, 0.5-10 mg/hr, Intravenous, Continuous, Ward, Kristen N, DO, Last Rate: 3 mL/hr at 02/11/22 0600, 3 mg/hr at 02/11/22 0600   ondansetron (ZOFRAN) 4 MG/2ML injection, , , ,    ondansetron (ZOFRAN) injection 4 mg, 4 mg, Intravenous, Q6H, Teressa Lower, NP, 4 mg at 02/11/22 0758   Oral care mouth rinse, 15 mL, Mouth Rinse, Q2H, Imonie Tuch, MD, 15 mL at 02/11/22 0751   Oral care mouth rinse, 15 mL, Mouth Rinse, PRN, Lanney Gins, Airabella Barley, MD, 15 mL at 02/10/22 1000   polyethylene glycol (MIRALAX /  GLYCOLAX) packet 17 g, 17 g, Oral, Daily PRN, Lang Snow, NP   propofol (DIPRIVAN) 1000 MG/100ML infusion, 5-80 mcg/kg/min, Intravenous, Continuous, Ward, Kristen N, DO, Last Rate: 13.03 mL/hr at 02/11/22 0600, 30 mcg/kg/min at 02/11/22 0600    ALLERGIES   Patient has no known allergies.    REVIEW OF SYSTEMS     Unable to obtain due to sedation and mechanical ventilation  PHYSICAL EXAMINATION   Vital Signs: Temp:  [95.2 F (35.1 C)-98.6 F (37 C)] 98.2 F (36.8 C) (01/15 0900) Pulse Rate:  [57-87] 64 (01/15 0900) Resp:  [13-24] 13 (01/15 0900) BP: (84-120)/(62-98) 120/98 (01/15 0900) SpO2:  [97 %-100 %] 100 % (01/15 0900) FiO2 (%):  [21 %-35 %] 21 % (01/15 0718) Weight:  [67.4 kg] 67.4 kg (01/15 0348)  GENERAL:Age appropriate NAD HEAD: Normocephalic, atraumatic.  EYES: Pupils equal, round, reactive to light.  No scleral icterus.  MOUTH: Moist mucosal membrane. NECK: Supple. No thyromegaly. No nodules. No JVD.  PULMONARY: decreased air entry bilaterally  CARDIOVASCULAR: S1 and S2. Regular rate and rhythm. No murmurs, rubs, or gallops.  GASTROINTESTINAL: Soft, nontender, non-distended. No masses. Positive bowel sounds. No hepatosplenomegaly.  MUSCULOSKELETAL: No swelling, clubbing, or edema.  NEUROLOGIC:GCS 4T SKIN:intact,warm,dry   PERTINENT DATA     Infusions:  dextrose 5 % and 0.45% NaCl 75 mL/hr at 02/11/22 0600   midazolam 3 mg/hr (02/11/22 0600)   propofol (DIPRIVAN) infusion 30 mcg/kg/min (02/11/22 0600)   Scheduled Medications:  Chlorhexidine Gluconate Cloth  6 each Topical Daily   famotidine  20 mg Per Tube BID   ondansetron       ondansetron (ZOFRAN) IV  4 mg Intravenous Q6H   mouth rinse  15 mL Mouth Rinse Q2H   PRN Medications: docusate sodium, ondansetron, mouth rinse, polyethylene glycol Hemodynamic parameters:   Intake/Output: 01/14 0701 - 01/15 0700 In: 2525.3 [I.V.:2298.7; NG/GT:30; IV Piggyback:196.6] Out: 1940  [Urine:1890; Emesis/NG output:50]  Ventilator  Settings: Vent Mode: Spontaneous FiO2 (%):  [21 %-35 %] 21 % Set Rate:  [18 bmp] 18 bmp Vt Set:  [480 mL] 480 mL PEEP:  [5 cmH20] 5 cmH20 Pressure Support:  [5 cmH20] 5 cmH20 Plateau Pressure:  [10 cmH20-19 cmH20] 19 cmH20   LAB RESULTS:  Basic Metabolic Panel: Recent Labs  Lab 02/10/22 0617 02/11/22 0440  NA 137 138  K 3.6 3.9  CL  106 110  CO2 18* 23  GLUCOSE 110* 81  BUN 18 14  CREATININE 0.82 0.73  CALCIUM 8.7* 8.4*  MG 2.0 2.3  PHOS  --  3.5    Liver Function Tests: Recent Labs  Lab 02/10/22 0617  AST 28  ALT 13  ALKPHOS 71  BILITOT 0.6  PROT 7.0  ALBUMIN 3.4*    No results for input(s): "LIPASE", "AMYLASE" in the last 168 hours. Recent Labs  Lab 02/10/22 0743  AMMONIA 14    CBC: Recent Labs  Lab 02/10/22 0617 02/11/22 0440  WBC 8.9 7.8  NEUTROABS 5.7  --   HGB 13.2 11.2*  HCT 39.9 33.9*  MCV 93.9 93.9  PLT 240 205    Cardiac Enzymes: Recent Labs  Lab 02/10/22 0617  CKTOTAL 44*    BNP: Invalid input(s): "POCBNP" CBG: Recent Labs  Lab 02/10/22 2108 02/10/22 2335 02/11/22 0332 02/11/22 0739 02/11/22 0807  GLUCAP 72 80 86 63* 111*        IMAGING RESULTS:  Imaging: DG Abd Portable 1V  Result Date: 02/10/2022 CLINICAL DATA:  Nasogastric tube placement. EXAM: PORTABLE ABDOMEN - 1 VIEW COMPARISON:  None Available. FINDINGS: Enteric tube appears adequately positioned in the stomach. Incomplete visualization of the bowel gas pattern. IMPRESSION: OG tube appears adequately positioned in the stomach. Electronically Signed   By: Franki Cabot M.D.   On: 02/10/2022 09:58   CT HEAD WO CONTRAST  Result Date: 02/10/2022 CLINICAL DATA:  62 year old male with history of new onset of seizure. Trauma to the head and neck. EXAM: CT HEAD WITHOUT CONTRAST CT CERVICAL SPINE WITHOUT CONTRAST TECHNIQUE: Multidetector CT imaging of the head and cervical spine was performed following the standard  protocol without intravenous contrast. Multiplanar CT image reconstructions of the cervical spine were also generated. RADIATION DOSE REDUCTION: This exam was performed according to the departmental dose-optimization program which includes automated exposure control, adjustment of the mA and/or kV according to patient size and/or use of iterative reconstruction technique. COMPARISON:  Head CT 01/15/2022. CT of the cervical spine 05/17/2021. FINDINGS: CT HEAD FINDINGS Brain: Faint physiologic calcification in the right basal ganglia, unchanged. No evidence of acute infarction, hemorrhage, hydrocephalus, extra-axial collection or mass lesion/mass effect. Vascular: No hyperdense vessel or unexpected calcification. Skull: Normal. Negative for fracture or focal lesion. Sinuses/Orbits: No acute finding. Other: None. CT CERVICAL SPINE FINDINGS Alignment: Normal. Skull base and vertebrae: No acute fracture. No primary bone lesion or focal pathologic process. Soft tissues and spinal canal: No prevertebral fluid or swelling. No visible canal hematoma. Disc levels: Multilevel degenerative disc disease, most severe at C5-C6 and C6-C7. Mild multilevel facet arthropathy bilaterally. Upper chest: Mild pleural-parenchymal thickening in the lung apices bilaterally, similar to prior studies, most compatible with areas of chronic post infectious or inflammatory scarring. Mild paraseptal emphysema. Other: Endotracheal and orogastric tubes partially imaged. IMPRESSION: 1. No evidence of significant acute traumatic injury to the skull, brain or cervical spine. 2. The appearance of the brain is normal. 3. Multilevel degenerative disc disease and cervical spondylosis, similar to prior studies, as above. Electronically Signed   By: Vinnie Langton M.D.   On: 02/10/2022 07:14   CT Cervical Spine Wo Contrast  Result Date: 02/10/2022 CLINICAL DATA:  62 year old male with history of new onset of seizure. Trauma to the head and neck. EXAM:  CT HEAD WITHOUT CONTRAST CT CERVICAL SPINE WITHOUT CONTRAST TECHNIQUE: Multidetector CT imaging of the head and cervical spine was performed following the standard protocol without intravenous contrast.  Multiplanar CT image reconstructions of the cervical spine were also generated. RADIATION DOSE REDUCTION: This exam was performed according to the departmental dose-optimization program which includes automated exposure control, adjustment of the mA and/or kV according to patient size and/or use of iterative reconstruction technique. COMPARISON:  Head CT 01/15/2022. CT of the cervical spine 05/17/2021. FINDINGS: CT HEAD FINDINGS Brain: Faint physiologic calcification in the right basal ganglia, unchanged. No evidence of acute infarction, hemorrhage, hydrocephalus, extra-axial collection or mass lesion/mass effect. Vascular: No hyperdense vessel or unexpected calcification. Skull: Normal. Negative for fracture or focal lesion. Sinuses/Orbits: No acute finding. Other: None. CT CERVICAL SPINE FINDINGS Alignment: Normal. Skull base and vertebrae: No acute fracture. No primary bone lesion or focal pathologic process. Soft tissues and spinal canal: No prevertebral fluid or swelling. No visible canal hematoma. Disc levels: Multilevel degenerative disc disease, most severe at C5-C6 and C6-C7. Mild multilevel facet arthropathy bilaterally. Upper chest: Mild pleural-parenchymal thickening in the lung apices bilaterally, similar to prior studies, most compatible with areas of chronic post infectious or inflammatory scarring. Mild paraseptal emphysema. Other: Endotracheal and orogastric tubes partially imaged. IMPRESSION: 1. No evidence of significant acute traumatic injury to the skull, brain or cervical spine. 2. The appearance of the brain is normal. 3. Multilevel degenerative disc disease and cervical spondylosis, similar to prior studies, as above. Electronically Signed   By: Vinnie Langton M.D.   On: 02/10/2022 07:14    DG Chest 1 View  Result Date: 02/10/2022 CLINICAL DATA:  63 year old male intubated.  Enteric tube placement. EXAM: CHEST  1 VIEW COMPARISON:  CT Chest, Abdomen, and Pelvis 01/15/2022 and earlier. FINDINGS: Portable AP supine view at 0635 hours. Endotracheal tube tip in good position between the level the clavicles and carina. Enteric tube courses to the abdomen and side hole is at the level of the gastric cardia. Mediastinal contours remain normal. Lung volumes are stable. Allowing for portable technique the lungs are clear. Chronic left rib fractures. No acute osseous abnormality identified. Paucity of bowel gas in the visible abdomen. IMPRESSION: 1. Satisfactory endotracheal tube. 2. Enteric tube placed into the stomach with side hole at the gastric cardia, advance several cm for optimal placement. 3.  No acute cardiopulmonary abnormality. Electronically Signed   By: Genevie Ann M.D.   On: 02/10/2022 06:45   '@PROBHOSP'$ @ DG Abd Portable 1V  Result Date: 02/10/2022 CLINICAL DATA:  Nasogastric tube placement. EXAM: PORTABLE ABDOMEN - 1 VIEW COMPARISON:  None Available. FINDINGS: Enteric tube appears adequately positioned in the stomach. Incomplete visualization of the bowel gas pattern. IMPRESSION: OG tube appears adequately positioned in the stomach. Electronically Signed   By: Franki Cabot M.D.   On: 02/10/2022 09:58     ASSESSMENT AND PLAN    -Multidisciplinary rounds held today  Acute Generalized seizures     - patient was intubated on arrival now s/p extubation.    - patient is on propofol now with no appreciable seizure activity    - folate , thiamine, IVF,     - labs reviewed and are reassuring  NEUROLOGY - keep patient on CIWA protocol post extubation.   ID -continue IV abx as prescibed -follow up cultures  GI/Nutrition GI PROPHYLAXIS as indicated DIET-->TF's as tolerated Constipation protocol as indicated  ENDO - ICU hypoglycemic\Hyperglycemia protocol -check FSBS per  protocol   ELECTROLYTES -follow labs as needed -replace as needed -pharmacy consultation   DVT/GI PRX ordered -SCDs  TRANSFUSIONS AS NEEDED MONITOR FSBS ASSESS the need for LABS as needed  Critical care provider statement:   Total critical care time: 33 minutes   Performed by: Lanney Gins MD   Critical care time was exclusive of separately billable procedures and treating other patients.   Critical care was necessary to treat or prevent imminent or life-threatening deterioration.   Critical care was time spent personally by me on the following activities: development of treatment plan with patient and/or surrogate as well as nursing, discussions with consultants, evaluation of patient's response to treatment, examination of patient, obtaining history from patient or surrogate, ordering and performing treatments and interventions, ordering and review of laboratory studies, ordering and review of radiographic studies, pulse oximetry and re-evaluation of patient's condition.    Ottie Glazier, M.D.  Pulmonary & Critical Care Medicine

## 2022-02-12 ENCOUNTER — Telehealth: Payer: Self-pay | Admitting: Family Medicine

## 2022-02-12 DIAGNOSIS — R569 Unspecified convulsions: Principal | ICD-10-CM

## 2022-02-12 DIAGNOSIS — F200 Paranoid schizophrenia: Secondary | ICD-10-CM | POA: Diagnosis not present

## 2022-02-12 DIAGNOSIS — T50902A Poisoning by unspecified drugs, medicaments and biological substances, intentional self-harm, initial encounter: Secondary | ICD-10-CM

## 2022-02-12 LAB — BASIC METABOLIC PANEL
Anion gap: 6 (ref 5–15)
BUN: 9 mg/dL (ref 8–23)
CO2: 25 mmol/L (ref 22–32)
Calcium: 8.6 mg/dL — ABNORMAL LOW (ref 8.9–10.3)
Chloride: 106 mmol/L (ref 98–111)
Creatinine, Ser: 0.77 mg/dL (ref 0.61–1.24)
GFR, Estimated: >60 mL/min (ref >60)
Glucose, Bld: 97 mg/dL (ref 70–99)
Potassium: 3.7 mmol/L (ref 3.5–5.1)
Sodium: 137 mmol/L (ref 135–145)

## 2022-02-12 LAB — CBC WITH DIFFERENTIAL/PLATELET
Abs Immature Granulocytes: 0.01 10*3/uL (ref 0.00–0.07)
Basophils Absolute: 0.1 10*3/uL (ref 0.0–0.1)
Basophils Relative: 1 %
Eosinophils Absolute: 0.3 10*3/uL (ref 0.0–0.5)
Eosinophils Relative: 6 %
HCT: 32.6 % — ABNORMAL LOW (ref 39.0–52.0)
Hemoglobin: 10.8 g/dL — ABNORMAL LOW (ref 13.0–17.0)
Immature Granulocytes: 0 %
Lymphocytes Relative: 41 %
Lymphs Abs: 2.1 10*3/uL (ref 0.7–4.0)
MCH: 30.8 pg (ref 26.0–34.0)
MCHC: 33.1 g/dL (ref 30.0–36.0)
MCV: 92.9 fL (ref 80.0–100.0)
Monocytes Absolute: 0.4 10*3/uL (ref 0.1–1.0)
Monocytes Relative: 8 %
Neutro Abs: 2.2 10*3/uL (ref 1.7–7.7)
Neutrophils Relative %: 44 %
Platelets: 209 10*3/uL (ref 150–400)
RBC: 3.51 MIL/uL — ABNORMAL LOW (ref 4.22–5.81)
RDW: 12.2 % (ref 11.5–15.5)
WBC: 5.1 10*3/uL (ref 4.0–10.5)
nRBC: 0 % (ref 0.0–0.2)

## 2022-02-12 LAB — GLUCOSE, CAPILLARY
Glucose-Capillary: 75 mg/dL (ref 70–99)
Glucose-Capillary: 79 mg/dL (ref 70–99)
Glucose-Capillary: 95 mg/dL (ref 70–99)

## 2022-02-12 LAB — PHOSPHORUS: Phosphorus: 3.7 mg/dL (ref 2.5–4.6)

## 2022-02-12 LAB — MAGNESIUM: Magnesium: 2.1 mg/dL (ref 1.7–2.4)

## 2022-02-12 MED ORDER — FAMOTIDINE 20 MG PO TABS
20.0000 mg | ORAL_TABLET | Freq: Two times a day (BID) | ORAL | Status: DC
  Administered 2022-02-12: 20 mg via ORAL
  Filled 2022-02-12: qty 1

## 2022-02-12 MED ORDER — GABAPENTIN 100 MG PO CAPS: 100.0000 mg | ORAL_CAPSULE | Freq: Three times a day (TID) | ORAL | 0 refills | Status: DC

## 2022-02-12 NOTE — Discharge Summary (Addendum)
Physician Discharge Summary   Patient: Richard Vasquez MRN: [id number] DOB: Dec 09, 1960  Admit date:     02/10/2022  Discharge date: 02/12/22  Discharge Physician: Fritzi Mandes   PCP: Leone Haven, MD   Recommendations at discharge:    F/u PCP in 1-2 weeks F/u your psychiatrist on your appt  Discharge Diagnoses: Principal Problem:   Schizophrenia Richard Vasquez) Active Problems:   Intentional drug overdose Richard Vasquez)   Seizure (Richard Vasquez)   [name] is a 62 y.o. malewith a history of paranoid schizophrenia, major depressive disorder, and anxiety admitted after taking an excessive amount of over-the-counter sleep medication with his regular psychiatric medications, leading to an unintentional overdose and seizure activity necessitating current hospitalization and mechanical intubation. Patient was extubated  in the ICU  and currently on RA His UDS was + for benzo and TCA. CT head was essentially unremarkable, CT neck with chronic degenerative disc disease.  Patient states. "I took over the counter sleep medicine with my psych medicine and it didn't do right".  Denies SI   Unintentional overdose with over-the-counter sleep aid. Seizure activity --pt was mechanically ventilated for airway protection now extubated on room air. Alert oriented times three. Denies suicidal ideation. -- Patient has placed back on his psych meds.  -- He was evaluated by behavioral medicine nurse practitioner. No indication for inpatient behavioral medicine transfer. Patient is alert and oriented. -- Okay to discharge from psychiatry standpoint. Dr. Weber Cooks notified. -- Patient advised to follow-up with outpatient psychiatry on his appointment --pt advised not to drive till cleared by his PCP --no seizures reported  History of schizophrenia chronic -- resumed meds  Chronic pain -- continue Suboxone  Overall hemodynamically stable. Will discharge to home. Discussed with patient and his fiance.    Pain control  - [employer] Controlled Substance Reporting System database was reviewed. and patient was instructed, not to drive, operate heavy machinery, perform activities at heights, swimming or participation in water activities or provide baby-sitting services while on Pain, Sleep and Anxiety Medications; until their outpatient Physician has advised to do so again. Also recommended to not to take more than prescribed Pain, Sleep and Anxiety Medications.  Consultants: PCCM, Psychiatry Procedures performed: mech ventilation  Disposition:  Boarding house Diet recommendation:  Discharge Diet Orders (From admission, onward)     Start     Ordered   02/12/22 0000  Diet general        02/12/22 1035           Regular diet DISCHARGE MEDICATION: Allergies as of 02/12/2022   No Known Allergies      Medication List     STOP taking these medications    FeroSul 325 (65 FE) MG tablet Generic drug: ferrous sulfate   hydrochlorothiazide 12.5 MG tablet Commonly known as: HYDRODIURIL   sildenafil 20 MG tablet Commonly known as: REVATIO   traZODone 100 MG tablet Commonly known as: DESYREL   Vitamin D (Ergocalciferol) 1.25 MG (50000 UNIT) Caps capsule Commonly known as: DRISDOL       TAKE these medications    buprenorphine-naloxone 8-2 mg Subl SL tablet Commonly known as: SUBOXONE Place 0.5 tablets under the tongue daily.   gabapentin 100 MG capsule Commonly known as: NEURONTIN Take 1 capsule (100 mg total) by mouth 3 (three) times daily.   hydrOXYzine 25 MG capsule Commonly known as: VISTARIL Take 25 mg by mouth 4 (four) times daily as needed for anxiety.   ibuprofen 600 MG tablet Commonly known as:  ADVIL Take 1 tablet (600 mg total) by mouth every 8 (eight) hours as needed for moderate pain.   methocarbamol 500 MG tablet Commonly known as: ROBAXIN Take 1 tablet (500 mg total) by mouth every 8 (eight) hours as needed for muscle spasms.   OLANZapine 10 MG tablet Commonly known  as: ZYPREXA Take '10mg'$  by mouth once daily What changed:  how much to take when to take this   pantoprazole 40 MG tablet Commonly known as: PROTONIX Take 1 tablet (40 mg total) by mouth daily. What changed:  when to take this reasons to take this   pravastatin 20 MG tablet Commonly known as: PRAVACHOL Take 1 tablet (20 mg total) by mouth daily.   QUEtiapine 50 MG tablet Commonly known as: SEROQUEL Take 50 mg by mouth daily.   QUEtiapine 200 MG tablet Commonly known as: SEROQUEL Take 200 mg by mouth at bedtime.   sertraline 25 MG tablet Commonly known as: ZOLOFT Take 25 mg by mouth daily.   Spiriva Respimat 2.5 MCG/ACT Aers Generic drug: Tiotropium Bromide Monohydrate Inhale 2 puffs into the lungs daily.   Ventolin HFA 108 (90 Base) MCG/ACT inhaler Generic drug: albuterol Inhale 1-2 puffs into the lungs every 6 (six) hours as needed for wheezing or shortness of breath.   VITAMIN C PO Take 1 capsule by mouth daily.        Follow-up Information     Leone Haven, MD. Schedule an appointment as soon as possible for a visit in 1 week(s).   Specialty: Family Medicine Why: 02/19/22 11AM Contact information: [place] [address] [phone]                [place] [employer]   02/10/22 0614 02/11/22 0348  Weight: 72.4 kg 67.4 kg   Alert and oriented times three. Mood stable.  Condition at discharge: fair  The results of significant diagnostics from this hospitalization (including imaging, microbiology, ancillary and laboratory) are listed below for reference.   Imaging Studies: DG Abd Portable 1V  Result Date: 02/10/2022 CLINICAL DATA:  Nasogastric tube placement. EXAM: PORTABLE ABDOMEN - 1 VIEW COMPARISON:  None Available. FINDINGS: Enteric tube appears adequately positioned in the stomach. Incomplete visualization of the bowel gas pattern. IMPRESSION: OG tube appears adequately positioned in the stomach. Electronically  Signed   By: Franki Cabot M.D.   On: 02/10/2022 09:58   CT HEAD WO CONTRAST  Result Date: 02/10/2022 CLINICAL DATA:  61 year old male with history of new onset of seizure. Trauma to the head and neck. EXAM: CT HEAD WITHOUT CONTRAST CT CERVICAL SPINE WITHOUT CONTRAST TECHNIQUE: Multidetector CT imaging of the head and cervical spine was performed following the standard protocol without intravenous contrast. Multiplanar CT image reconstructions of the cervical spine were also generated. RADIATION DOSE REDUCTION: This exam was performed according to the departmental dose-optimization program which includes automated exposure control, adjustment of the mA and/or kV according to patient size and/or use of iterative reconstruction technique. COMPARISON:  Head CT 01/15/2022. CT of the cervical spine 05/17/2021. FINDINGS: CT HEAD FINDINGS Brain: Faint physiologic calcification in the right basal ganglia, unchanged. No evidence of acute infarction, hemorrhage, hydrocephalus, extra-axial collection or mass lesion/mass effect. Vascular: No hyperdense vessel or unexpected calcification. Skull: Normal. Negative for fracture or focal lesion. Sinuses/Orbits: No acute finding. Other: None. CT CERVICAL SPINE FINDINGS Alignment: Normal. Skull base and vertebrae: No acute fracture. No primary bone lesion or focal pathologic process. Soft tissues and spinal canal: No prevertebral  fluid or swelling. No visible canal hematoma. Disc levels: Multilevel degenerative disc disease, most severe at C5-C6 and C6-C7. Mild multilevel facet arthropathy bilaterally. Upper chest: Mild pleural-parenchymal thickening in the lung apices bilaterally, similar to prior studies, most compatible with areas of chronic post infectious or inflammatory scarring. Mild paraseptal emphysema. Other: Endotracheal and orogastric tubes partially imaged. IMPRESSION: 1. No evidence of significant acute traumatic injury to the skull, brain or cervical spine. 2. The  appearance of the brain is normal. 3. Multilevel degenerative disc disease and cervical spondylosis, similar to prior studies, as above. Electronically Signed   By: Vinnie Langton M.D.   On: 02/10/2022 07:14   CT Cervical Spine Wo Contrast  Result Date: 02/10/2022 CLINICAL DATA:  62 year old male with history of new onset of seizure. Trauma to the head and neck. EXAM: CT HEAD WITHOUT CONTRAST CT CERVICAL SPINE WITHOUT CONTRAST TECHNIQUE: Multidetector CT imaging of the head and cervical spine was performed following the standard protocol without intravenous contrast. Multiplanar CT image reconstructions of the cervical spine were also generated. RADIATION DOSE REDUCTION: This exam was performed according to the departmental dose-optimization program which includes automated exposure control, adjustment of the mA and/or kV according to patient size and/or use of iterative reconstruction technique. COMPARISON:  Head CT 01/15/2022. CT of the cervical spine 05/17/2021. FINDINGS: CT HEAD FINDINGS Brain: Faint physiologic calcification in the right basal ganglia, unchanged. No evidence of acute infarction, hemorrhage, hydrocephalus, extra-axial collection or mass lesion/mass effect. Vascular: No hyperdense vessel or unexpected calcification. Skull: Normal. Negative for fracture or focal lesion. Sinuses/Orbits: No acute finding. Other: None. CT CERVICAL SPINE FINDINGS Alignment: Normal. Skull base and vertebrae: No acute fracture. No primary bone lesion or focal pathologic process. Soft tissues and spinal canal: No prevertebral fluid or swelling. No visible canal hematoma. Disc levels: Multilevel degenerative disc disease, most severe at C5-C6 and C6-C7. Mild multilevel facet arthropathy bilaterally. Upper chest: Mild pleural-parenchymal thickening in the lung apices bilaterally, similar to prior studies, most compatible with areas of chronic post infectious or inflammatory scarring. Mild paraseptal emphysema. Other:  Endotracheal and orogastric tubes partially imaged. IMPRESSION: 1. No evidence of significant acute traumatic injury to the skull, brain or cervical spine. 2. The appearance of the brain is normal. 3. Multilevel degenerative disc disease and cervical spondylosis, similar to prior studies, as above. Electronically Signed   By: Vinnie Langton M.D.   On: 02/10/2022 07:14   DG Chest 1 View  Result Date: 02/10/2022 CLINICAL DATA:  61 year old male intubated.  Enteric tube placement. EXAM: CHEST  1 VIEW COMPARISON:  CT Chest, Abdomen, and Pelvis 01/15/2022 and earlier. FINDINGS: Portable AP supine view at 0635 hours. Endotracheal tube tip in good position between the level the clavicles and carina. Enteric tube courses to the abdomen and side hole is at the level of the gastric cardia. Mediastinal contours remain normal. Lung volumes are stable. Allowing for portable technique the lungs are clear. Chronic left rib fractures. No acute osseous abnormality identified. Paucity of bowel gas in the visible abdomen. IMPRESSION: 1. Satisfactory endotracheal tube. 2. Enteric tube placed into the stomach with side hole at the gastric cardia, advance several cm for optimal placement. 3.  No acute cardiopulmonary abnormality. Electronically Signed   By: Genevie Ann M.D.   On: 02/10/2022 06:45   CT CHEST ABDOMEN PELVIS W CONTRAST  Result Date: 01/15/2022 CLINICAL DATA:  Blunt trauma EXAM: CT CHEST, ABDOMEN, AND PELVIS WITH CONTRAST CT THORACIC AND LUMBAR SPINE WITH CONTRAST TECHNIQUE: Multidetector CT  imaging of the chest, abdomen and pelvis was performed following the standard protocol during bolus administration of intravenous contrast. Multidetector CT imaging of the thoracic and lumbar spine was performed following the standard protocol during bolus administration of intravenous contrast. RADIATION DOSE REDUCTION: This exam was performed according to the departmental dose-optimization program which includes automated  exposure control, adjustment of the mA and/or kV according to patient size and/or use of iterative reconstruction technique. CONTRAST:  24m OMNIPAQUE IOHEXOL 350 MG/ML SOLN COMPARISON:  CT chest, 01/05/2022, CT chest abdomen pelvis, 05/17/2021 FINDINGS: CT CHEST FINDINGS Cardiovascular: Aortic atherosclerosis. Normal heart size. Three-vessel coronary artery calcifications. No pericardial effusion. Mediastinum/Nodes: No enlarged mediastinal, hilar, or axillary lymph nodes. Thyroid gland, trachea, and esophagus demonstrate no significant findings. Lungs/Pleura: Mild centrilobular and paraseptal emphysema. Significantly improved, almost completely resolved heterogeneous ground-glass and consolidation previously seen throughout the right lung. Mild, persistent diffuse bilateral bronchial wall thickening and scattered bronchiolar plugging in the dependent right lower lobe, with small clusters of centrilobular nodules persisting in the dependent right lower lobe (series 507, image 112), and anterior right upper lobe (series 507, image 71). Unchanged 0.7 cm nodule in the posterior right upper lobe (series 507, image 56). No pleural effusion or pneumothorax. Musculoskeletal: No chest wall mass or suspicious osseous lesions identified. Nondisplaced fracture of the posterior right eleventh rib (series 1, image 129). Multiple nonacute fractures of the left ribs. CT ABDOMEN PELVIS FINDINGS Hepatobiliary: No solid liver abnormality is seen. No gallstones, gallbladder wall thickening, or biliary dilatation. Pancreas: Unremarkable. No pancreatic ductal dilatation or surrounding inflammatory changes. Spleen: Normal in size without significant abnormality. Adrenals/Urinary Tract: Adrenal glands are unremarkable. Kidneys are normal, without renal calculi, solid lesion, or hydronephrosis. Bladder is unremarkable. Stomach/Bowel: Stomach is within normal limits. Appendix is not clearly visualized. No evidence of bowel wall thickening,  distention, or inflammatory changes. Vascular/Lymphatic: Aortic atherosclerosis. No enlarged abdominal or pelvic lymph nodes. Reproductive: No mass or other abnormality. Other: No abdominal wall hernia or abnormality. No ascites. Musculoskeletal: No acute osseous findings. Left hip intramedullary nail fixation. Chronic fracture deformities of the inferior pubic rami and left pubic symphysis. CT THORACIC AND LUMBAR SPINE FINDINGS Alignment: Normal thoracic kyphosis. Normal lumbar lordosis. Vertebral bodies: Intact. No acute fracture or dislocation. Nonacute right transverse process fractures of L2 and L3 (series 13, image 90). Disc spaces: Focally severe disc space height loss and osteophytosis of L1-L2, with otherwise mild disc space height loss and endplate osteophytosis throughout the thoracic and lumbar spine. Paraspinous soft tissues: Unremarkable. IMPRESSION: 1. Nondisplaced fracture of the posterior right eleventh rib. No pneumothorax. 2. No other evidence of acute traumatic injury to the chest, abdomen, or pelvis. 3. When compared to recent CT of the chest dated 01/05/2022, significantly improved, almost completely resolved heterogeneous ground-glass and consolidation previously seen throughout the right lung, consistent with nearly resolved infection or aspiration. 4. Mild, persistent diffuse bilateral bronchial wall thickening and scattered bronchiolar plugging in the dependent right lower lobe, with small clusters of centrilobular nodules persisting in the dependent right lower lobe and anterior right upper lobe. Findings are consistent with residua of atypical infection or aspiration. 5. Unchanged 0.7 cm nodule in the posterior right upper lobe, nonspecific. As reported by prior examination dated 01/05/2022, recommend initial follow-up CT in 6-12 months. 6. Emphysema. 7. Coronary artery disease. Aortic Atherosclerosis (ICD10-I70.0) and Emphysema (ICD10-J43.9). Electronically Signed   By: ADelanna Ahmadi M.D.   On: 01/15/2022 18:03   CT T-SPINE NO CHARGE  Result Date: 01/15/2022 CLINICAL DATA:  Blunt trauma EXAM: CT CHEST, ABDOMEN, AND PELVIS WITH CONTRAST CT THORACIC AND LUMBAR SPINE WITH CONTRAST TECHNIQUE: Multidetector CT imaging of the chest, abdomen and pelvis was performed following the standard protocol during bolus administration of intravenous contrast. Multidetector CT imaging of the thoracic and lumbar spine was performed following the standard protocol during bolus administration of intravenous contrast. RADIATION DOSE REDUCTION: This exam was performed according to the departmental dose-optimization program which includes automated exposure control, adjustment of the mA and/or kV according to patient size and/or use of iterative reconstruction technique. CONTRAST:  60m OMNIPAQUE IOHEXOL 350 MG/ML SOLN COMPARISON:  CT chest, 01/05/2022, CT chest abdomen pelvis, 05/17/2021 FINDINGS: CT CHEST FINDINGS Cardiovascular: Aortic atherosclerosis. Normal heart size. Three-vessel coronary artery calcifications. No pericardial effusion. Mediastinum/Nodes: No enlarged mediastinal, hilar, or axillary lymph nodes. Thyroid gland, trachea, and esophagus demonstrate no significant findings. Lungs/Pleura: Mild centrilobular and paraseptal emphysema. Significantly improved, almost completely resolved heterogeneous ground-glass and consolidation previously seen throughout the right lung. Mild, persistent diffuse bilateral bronchial wall thickening and scattered bronchiolar plugging in the dependent right lower lobe, with small clusters of centrilobular nodules persisting in the dependent right lower lobe (series 507, image 112), and anterior right upper lobe (series 507, image 71). Unchanged 0.7 cm nodule in the posterior right upper lobe (series 507, image 56). No pleural effusion or pneumothorax. Musculoskeletal: No chest wall mass or suspicious osseous lesions identified. Nondisplaced fracture of the posterior  right eleventh rib (series 1, image 129). Multiple nonacute fractures of the left ribs. CT ABDOMEN PELVIS FINDINGS Hepatobiliary: No solid liver abnormality is seen. No gallstones, gallbladder wall thickening, or biliary dilatation. Pancreas: Unremarkable. No pancreatic ductal dilatation or surrounding inflammatory changes. Spleen: Normal in size without significant abnormality. Adrenals/Urinary Tract: Adrenal glands are unremarkable. Kidneys are normal, without renal calculi, solid lesion, or hydronephrosis. Bladder is unremarkable. Stomach/Bowel: Stomach is within normal limits. Appendix is not clearly visualized. No evidence of bowel wall thickening, distention, or inflammatory changes. Vascular/Lymphatic: Aortic atherosclerosis. No enlarged abdominal or pelvic lymph nodes. Reproductive: No mass or other abnormality. Other: No abdominal wall hernia or abnormality. No ascites. Musculoskeletal: No acute osseous findings. Left hip intramedullary nail fixation. Chronic fracture deformities of the inferior pubic rami and left pubic symphysis. CT THORACIC AND LUMBAR SPINE FINDINGS Alignment: Normal thoracic kyphosis. Normal lumbar lordosis. Vertebral bodies: Intact. No acute fracture or dislocation. Nonacute right transverse process fractures of L2 and L3 (series 13, image 90). Disc spaces: Focally severe disc space height loss and osteophytosis of L1-L2, with otherwise mild disc space height loss and endplate osteophytosis throughout the thoracic and lumbar spine. Paraspinous soft tissues: Unremarkable. IMPRESSION: 1. Nondisplaced fracture of the posterior right eleventh rib. No pneumothorax. 2. No other evidence of acute traumatic injury to the chest, abdomen, or pelvis. 3. When compared to recent CT of the chest dated 01/05/2022, significantly improved, almost completely resolved heterogeneous ground-glass and consolidation previously seen throughout the right lung, consistent with nearly resolved infection or  aspiration. 4. Mild, persistent diffuse bilateral bronchial wall thickening and scattered bronchiolar plugging in the dependent right lower lobe, with small clusters of centrilobular nodules persisting in the dependent right lower lobe and anterior right upper lobe. Findings are consistent with residua of atypical infection or aspiration. 5. Unchanged 0.7 cm nodule in the posterior right upper lobe, nonspecific. As reported by prior examination dated 01/05/2022, recommend initial follow-up CT in 6-12 months. 6. Emphysema. 7. Coronary artery disease. Aortic Atherosclerosis (ICD10-I70.0) and Emphysema (ICD10-J43.9). Electronically Signed   By: ALanae Crumbly  Laqueta Carina M.D.   On: 01/15/2022 18:03   CT L-SPINE NO CHARGE  Result Date: 01/15/2022 CLINICAL DATA:  Blunt trauma EXAM: CT CHEST, ABDOMEN, AND PELVIS WITH CONTRAST CT THORACIC AND LUMBAR SPINE WITH CONTRAST TECHNIQUE: Multidetector CT imaging of the chest, abdomen and pelvis was performed following the standard protocol during bolus administration of intravenous contrast. Multidetector CT imaging of the thoracic and lumbar spine was performed following the standard protocol during bolus administration of intravenous contrast. RADIATION DOSE REDUCTION: This exam was performed according to the departmental dose-optimization program which includes automated exposure control, adjustment of the mA and/or kV according to patient size and/or use of iterative reconstruction technique. CONTRAST:  62m OMNIPAQUE IOHEXOL 350 MG/ML SOLN COMPARISON:  CT chest, 01/05/2022, CT chest abdomen pelvis, 05/17/2021 FINDINGS: CT CHEST FINDINGS Cardiovascular: Aortic atherosclerosis. Normal heart size. Three-vessel coronary artery calcifications. No pericardial effusion. Mediastinum/Nodes: No enlarged mediastinal, hilar, or axillary lymph nodes. Thyroid gland, trachea, and esophagus demonstrate no significant findings. Lungs/Pleura: Mild centrilobular and paraseptal emphysema. Significantly  improved, almost completely resolved heterogeneous ground-glass and consolidation previously seen throughout the right lung. Mild, persistent diffuse bilateral bronchial wall thickening and scattered bronchiolar plugging in the dependent right lower lobe, with small clusters of centrilobular nodules persisting in the dependent right lower lobe (series 507, image 112), and anterior right upper lobe (series 507, image 71). Unchanged 0.7 cm nodule in the posterior right upper lobe (series 507, image 56). No pleural effusion or pneumothorax. Musculoskeletal: No chest wall mass or suspicious osseous lesions identified. Nondisplaced fracture of the posterior right eleventh rib (series 1, image 129). Multiple nonacute fractures of the left ribs. CT ABDOMEN PELVIS FINDINGS Hepatobiliary: No solid liver abnormality is seen. No gallstones, gallbladder wall thickening, or biliary dilatation. Pancreas: Unremarkable. No pancreatic ductal dilatation or surrounding inflammatory changes. Spleen: Normal in size without significant abnormality. Adrenals/Urinary Tract: Adrenal glands are unremarkable. Kidneys are normal, without renal calculi, solid lesion, or hydronephrosis. Bladder is unremarkable. Stomach/Bowel: Stomach is within normal limits. Appendix is not clearly visualized. No evidence of bowel wall thickening, distention, or inflammatory changes. Vascular/Lymphatic: Aortic atherosclerosis. No enlarged abdominal or pelvic lymph nodes. Reproductive: No mass or other abnormality. Other: No abdominal wall hernia or abnormality. No ascites. Musculoskeletal: No acute osseous findings. Left hip intramedullary nail fixation. Chronic fracture deformities of the inferior pubic rami and left pubic symphysis. CT THORACIC AND LUMBAR SPINE FINDINGS Alignment: Normal thoracic kyphosis. Normal lumbar lordosis. Vertebral bodies: Intact. No acute fracture or dislocation. Nonacute right transverse process fractures of L2 and L3 (series 13,  image 90). Disc spaces: Focally severe disc space height loss and osteophytosis of L1-L2, with otherwise mild disc space height loss and endplate osteophytosis throughout the thoracic and lumbar spine. Paraspinous soft tissues: Unremarkable. IMPRESSION: 1. Nondisplaced fracture of the posterior right eleventh rib. No pneumothorax. 2. No other evidence of acute traumatic injury to the chest, abdomen, or pelvis. 3. When compared to recent CT of the chest dated 01/05/2022, significantly improved, almost completely resolved heterogeneous ground-glass and consolidation previously seen throughout the right lung, consistent with nearly resolved infection or aspiration. 4. Mild, persistent diffuse bilateral bronchial wall thickening and scattered bronchiolar plugging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

## 2022-02-12 NOTE — Telephone Encounter (Signed)
Hospital called and a hospital follow up was scheduled for 02/19/2022 at 11 am.

## 2022-02-12 NOTE — TOC Transition Note (Signed)
Transition of Care [facility]) - CM/SW Discharge Note   Patient Details  Name: FIN HUPP MRN: [id number] Date of Birth: November 17, 1960  [facility]) CM/SW Contact:  Beverly Sessions, RN Phone Number: 02/12/2022, 11:18 AM   Clinical Narrative:     Patient to discharge today Per MD no TOC needs for discharge Significant other Kieth Brightly to transport at discharge     Barriers to Discharge: Continued Medical Work up   Patient Goals and CMS Choice      Discharge Placement                         Discharge Plan and Services Additional resources added to the After Visit Summary for     Discharge Planning Services: CM Consult            DME Arranged: N/A DME Agency: NA       HH Arranged: NA HH Agency: NA        Social Determinants of Health (SDOH) Interventions SDOH Screenings   Food Insecurity: No Food Insecurity (02/10/2022)  Housing: Low Risk  (02/10/2022)  Transportation Needs: No Transportation Needs (02/10/2022)  Alcohol Screen: Low Risk  (03/17/2020)  Depression (PHQ2-9): Low Risk  (11/01/2021)  Financial Resource Strain: Medium Risk (01/23/2021)  Physical Activity: Insufficiently Active (11/04/2018)  Stress: Stress Concern Present (06/12/2018)  Tobacco Use: High Risk (01/22/2022)     Readmission Risk Interventions    06/14/2021   11:11 AM  Readmission Risk Prevention Plan  Transportation Screening Complete  PCP or Specialist Appt within 3-5 Days Complete  Social Work Consult for [place] Planning/Counseling [place] Not Applicable  Medication Review Press photographer) Complete

## 2022-02-12 NOTE — Consult Note (Signed)
Patient refused his nighttime and morning dose of Suboxone, as noted in his chart.  This provider went up to speak with patient about refusal of Suboxone and he reported that he has not taken Suboxone for over a month and is attempting to discontinue its use. He was very pleasant and cooperative engaging easily in conversation. He reports he is excited and looking forward to his discharge today. Patient verbalized and acknowledged plan to follow up with [facility] outpatient psychiatric care.  [place] [facility], PMHNP-BC

## 2022-02-12 NOTE — Progress Notes (Signed)
Pt discharged per MD order. IV removed. Discharge instructions reviewed with pt. Pt verbalized understanding.  All questions answered to pt satisfaction.  

## 2022-02-12 NOTE — Discharge Instructions (Addendum)
Pt and fiancee advised to f/u Psychiatry as out pt on the appt Pt advised not to drive till cleared by is PCP or Psychiatrist.

## 2022-02-12 NOTE — Consult Note (Signed)
PHARMACY CONSULT NOTE - ELECTROLYTES  Pharmacy Consult for Electrolyte Monitoring and Replacement   Recent Labs: Potassium (mmol/L)  Date Value  02/12/2022 3.7   Magnesium (mg/dL)  Date Value  02/12/2022 2.1   Calcium (mg/dL)  Date Value  02/12/2022 8.6 (L)   Albumin (g/dL)  Date Value  02/10/2022 3.4 (L)  05/09/2020 4.3   Phosphorus (mg/dL)  Date Value  02/12/2022 3.7   Sodium (mmol/L)  Date Value  02/12/2022 137  03/20/2020 137   Corrected Ca: 9.2 mg/dL  Assessment  Richard [facility]quez is a 62 y.o. male presenting with seizure like activity. PMH significant for paranoid schizophrenia, HCV, PSA, BPH. Pharmacy has been consulted to monitor and replace electrolytes.  MIVF: dextrose 5 %-0.45 % sodium chloride  @ 75 mL/hr  Goal of Therapy: Electrolytes WNL  Plan:  No electrolyte replacement warranted for today Pharmacy will sign off electrolytes  Thank you for allowing pharmacy to be a part of this patient's care.   Glean Salvo, PharmD, BCPS Clinical Pharmacist  02/12/2022 7:28 AM

## 2022-02-15 LAB — CULTURE, BLOOD (ROUTINE X 2)
Culture: NO GROWTH
Culture: NO GROWTH
Special Requests: ADEQUATE

## 2022-02-19 ENCOUNTER — Encounter: Payer: Self-pay | Admitting: Family Medicine

## 2022-02-19 ENCOUNTER — Ambulatory Visit (INDEPENDENT_AMBULATORY_CARE_PROVIDER_SITE_OTHER): Payer: Medicaid Other | Admitting: Family Medicine

## 2022-02-19 VITALS — BP 115/70 | HR 89 | Temp 97.9°F | Ht 70.0 in | Wt 155.4 lb

## 2022-02-19 DIAGNOSIS — N5201 Erectile dysfunction due to arterial insufficiency: Secondary | ICD-10-CM

## 2022-02-19 DIAGNOSIS — F17210 Nicotine dependence, cigarettes, uncomplicated: Secondary | ICD-10-CM

## 2022-02-19 DIAGNOSIS — R569 Unspecified convulsions: Secondary | ICD-10-CM | POA: Diagnosis not present

## 2022-02-19 DIAGNOSIS — F331 Major depressive disorder, recurrent, moderate: Secondary | ICD-10-CM

## 2022-02-19 DIAGNOSIS — D649 Anemia, unspecified: Secondary | ICD-10-CM | POA: Diagnosis not present

## 2022-02-19 LAB — BASIC METABOLIC PANEL
BUN: 15 mg/dL (ref 6–23)
CO2: 25 mEq/L (ref 19–32)
Calcium: 8.8 mg/dL (ref 8.4–10.5)
Chloride: 105 mEq/L (ref 96–112)
Creatinine, Ser: 0.7 mg/dL (ref 0.40–1.50)
GFR: 99.6 mL/min (ref >60.00)
Glucose, Bld: 110 mg/dL — ABNORMAL HIGH (ref 70–99)
Potassium: 4.1 mEq/L (ref 3.5–5.1)
Sodium: 138 mEq/L (ref 135–145)

## 2022-02-19 LAB — CBC
HCT: 34.6 % — ABNORMAL LOW (ref 39.0–52.0)
Hemoglobin: 11.7 g/dL — ABNORMAL LOW (ref 13.0–17.0)
MCHC: 33.9 g/dL (ref 30.0–36.0)
MCV: 93.6 fl (ref 78.0–100.0)
Platelets: 304 10*3/uL (ref 150.0–400.0)
RBC: 3.69 Mil/uL — ABNORMAL LOW (ref 4.22–5.81)
RDW: 12.7 % (ref 11.5–15.5)
WBC: 9 10*3/uL (ref 4.0–10.5)

## 2022-02-19 MED ORDER — SILDENAFIL CITRATE 20 MG PO TABS: 80.0000 mg | ORAL_TABLET | Freq: Every day | ORAL | 1 refills | Status: DC | PRN

## 2022-02-19 NOTE — Assessment & Plan Note (Signed)
Chronic issue.  I will prescribe sildenafil 80 mg daily as needed for erectile dysfunction.  Discussed risk of priapism with this.  Discussed seeking medical attention if he develop chest pain or shortness of breath during intercourse.  Discussed letting emergency personnel or other doctors know if he has taken the Viagra.

## 2022-02-19 NOTE — Assessment & Plan Note (Signed)
Patient reports this is very well-controlled.  He is currently on hydroxyzine as needed, Zyprexa, Seroquel, and Zoloft.  He will continue to see a psychiatrist for those medications.  Advised to seek medical attention if he develops suicidal ideation.

## 2022-02-19 NOTE — Assessment & Plan Note (Signed)
Likely related to unintentional overdose.  Advised he needs to discuss this with his psychiatrist.  Discussed not driving until his psychiatrist releases him.
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
## 2022-02-19 NOTE — Patient Instructions (Signed)
Nice to see you. Please continue to work on quitting smoking.  You can call 1 800 quit now to see if they can help with nicotine patches and smoking cessation counseling. Do not drive until your psychiatrist releases you to do so. I sent the sildenafil for the erectile dysfunction to your pharmacy.

## 2022-02-19 NOTE — Assessment & Plan Note (Addendum)
Chronic issue.  Encourage smoking cessation.  Discussed use of the 1 800 quit now line for nicotine patches.  Other medication options are not appropriate for him given his psychiatric issues.  Discussed lung cancer screening.  Patient defers this at this time.

## 2022-03-06 ENCOUNTER — Ambulatory Visit: Payer: Medicaid Other | Admitting: Family Medicine

## 2022-05-22 ENCOUNTER — Encounter: Payer: Self-pay | Admitting: Family Medicine

## 2022-05-22 ENCOUNTER — Ambulatory Visit (INDEPENDENT_AMBULATORY_CARE_PROVIDER_SITE_OTHER): Payer: Medicaid Other | Admitting: Family Medicine

## 2022-05-22 VITALS — BP 118/74 | HR 75 | Temp 97.6°F | Ht 70.0 in | Wt 160.0 lb

## 2022-05-22 DIAGNOSIS — I10 Essential (primary) hypertension: Secondary | ICD-10-CM

## 2022-05-22 DIAGNOSIS — E785 Hyperlipidemia, unspecified: Secondary | ICD-10-CM | POA: Diagnosis not present

## 2022-05-22 DIAGNOSIS — N5201 Erectile dysfunction due to arterial insufficiency: Secondary | ICD-10-CM | POA: Diagnosis not present

## 2022-05-22 DIAGNOSIS — K2100 Gastro-esophageal reflux disease with esophagitis, without bleeding: Secondary | ICD-10-CM | POA: Diagnosis not present

## 2022-05-22 DIAGNOSIS — K219 Gastro-esophageal reflux disease without esophagitis: Secondary | ICD-10-CM | POA: Insufficient documentation

## 2022-05-22 DIAGNOSIS — M1990 Unspecified osteoarthritis, unspecified site: Secondary | ICD-10-CM | POA: Insufficient documentation

## 2022-05-22 MED ORDER — IBUPROFEN 600 MG PO TABS: 600.0000 mg | ORAL_TABLET | Freq: Three times a day (TID) | ORAL | 0 refills | Status: DC | PRN

## 2022-05-22 MED ORDER — METHOCARBAMOL 500 MG PO TABS: 500.0000 mg | ORAL_TABLET | Freq: Three times a day (TID) | ORAL | 0 refills | Status: DC | PRN

## 2022-05-22 MED ORDER — TADALAFIL 10 MG PO TABS
10.0000 mg | ORAL_TABLET | ORAL | 1 refills | Status: DC | PRN
Start: 2022-05-22 — End: 2022-12-24

## 2022-05-22 MED ORDER — PANTOPRAZOLE SODIUM 40 MG PO TBEC: 40.0000 mg | DELAYED_RELEASE_TABLET | Freq: Every day | ORAL | 1 refills | Status: DC

## 2022-05-22 NOTE — Assessment & Plan Note (Signed)
Chronic issue.  He will stop sildenafil.  We will trial Cialis 10 mg daily as needed for erectile dysfunction.

## 2022-05-22 NOTE — Assessment & Plan Note (Signed)
Chronic issue.  Check lipid panel.  Patient will continue pravastatin 20 mg daily.

## 2022-05-22 NOTE — Assessment & Plan Note (Signed)
Chronic issue.  Adequately controlled off of medication.  We will continue to monitor.

## 2022-05-22 NOTE — Assessment & Plan Note (Signed)
Chronic issue.  Well-controlled.  Will restart Protonix 40 mg daily.

## 2022-05-22 NOTE — Assessment & Plan Note (Signed)
Chronic issue.  Okay to refill ibuprofen and Robaxin.  Monitor kidney function.

## 2022-05-22 NOTE — Patient Instructions (Signed)
Nice to see. Please stop the sildenafil for erectile dysfunction.  Will trial Cialis to see if this is beneficial.

## 2022-05-22 NOTE — Progress Notes (Signed)
Marikay Alar, MD Phone: [phone]  Richard Vasquez is a 62 y.o. male who presents today for f/u.  GERD:  Chronic issue Reflux symptoms: no   Abd pain: no   Blood in stool: no  Dysphagia: no   EGD: no  Medication: taking OTC omeprazole or nexium, requests refill on protonix  Hypertension: Not checking blood pressures.  Not on medication.  No chest pain, shortness of breath, or edema.  Erectile dysfunction: Patient takes sildenafil though has to take 120 mg at a time for it to be effective.  Chronic joint pain: Patient has been in numerous accidents over the last several years that have resulted in joint pain for which she takes ibuprofen periodically.  He also takes Robaxin for periodic muscle spasms in his legs.  Robaxin does not make him drowsy.  Social History   Tobacco Use  Smoking Status Every Day   Packs/day: 1.00   Years: 20.00   Additional pack years: 0.00   Total pack years: 20.00   Types: Cigarettes   Last attempt to quit: 02/28/2021   Years since quitting: 1.2  Smokeless Tobacco Never    Current Outpatient Medications on File Prior to Visit  Medication Sig Dispense Refill   Ascorbic Acid (VITAMIN C PO) Take 1 capsule by mouth daily.     buprenorphine-naloxone (SUBOXONE) 8-2 mg SUBL SL tablet Place 0.5 tablets under the tongue daily.     gabapentin (NEURONTIN) 100 MG capsule Take 1 capsule (100 mg total) by mouth 3 (three) times daily. 15 capsule 0   hydrOXYzine (VISTARIL) 25 MG capsule Take 25 mg by mouth 4 (four) times daily as needed for anxiety.     OLANZapine (ZYPREXA) 10 MG tablet Take  by mouth once daily (Patient taking differently: Take 10 mg by mouth at bedtime.) 30 tablet 0   pravastatin (PRAVACHOL) 20 MG tablet Take 1 tablet (20 mg total) by mouth daily. 90 tablet 1   QUEtiapine (SEROQUEL) 200 MG tablet Take 200 mg by mouth at bedtime.     QUEtiapine (SEROQUEL) 50 MG tablet Take 50 mg by mouth daily.     sertraline (ZOLOFT) 25 MG tablet Take  25 mg by mouth daily.     No current facility-administered medications on file prior to visit.     ROS see history of present illness  Objective  Physical Exam Vitals:   05/22/22 1413  BP: 118/74  Pulse: 75  Temp: 97.6 F (36.4 C)  SpO2: 93%    BP Readings from Last 3 Encounters:  05/22/22 118/74  02/19/22 115/70  02/12/22 (!) 84/62   Wt Readings from Last 3 Encounters:  05/22/22 160 lb (72.6 kg)  02/19/22 155 lb 6.4 oz (70.5 kg)  02/11/22 148 lb 9.4 oz (67.4 kg)    Physical Exam Constitutional:      General: He is not in acute distress.    Appearance: He is not diaphoretic.  Cardiovascular:     Rate and Rhythm: Normal rate and regular rhythm.     Heart sounds: Normal heart sounds.  Pulmonary:     Effort: Pulmonary effort is normal.     Breath sounds: Normal breath sounds.  Skin:    General: Skin is warm and dry.  Neurological:     Mental Status: He is alert.      Assessment/Plan: Please see individual problem list.  Hypertension, unspecified type Assessment & Plan: Chronic issue.  Adequately controlled off of medication.  We will continue to monitor.   Hyperlipidemia,  unspecified hyperlipidemia type Assessment & Plan: Chronic issue.  Check lipid panel.  Patient will continue pravastatin 20 mg daily.  Orders: -     Lipid panel -     Comprehensive metabolic panel  Gastroesophageal reflux disease with esophagitis without hemorrhage Assessment & Plan: Chronic issue.  Well-controlled.  Will restart Protonix 40 mg daily.  Orders: -     Pantoprazole Sodium; Take 1 tablet (40 mg total) by mouth daily.  Dispense: 90 tablet; Refill: 1  Erectile dysfunction due to arterial insufficiency Assessment & Plan: Chronic issue.  He will stop sildenafil.  We will trial Cialis 10 mg daily as needed for erectile dysfunction.  Orders: -     Tadalafil; Take 1 tablet (10 mg total) by mouth every other day as needed for erectile dysfunction.  Dispense: 10 tablet;  Refill: 1  Arthritis Assessment & Plan: Chronic issue.  Okay to refill ibuprofen and Robaxin.  Monitor kidney function.  Orders: -     Ibuprofen; Take 1 tablet (600 mg total) by mouth every 8 (eight) hours as needed for moderate pain.  Dispense: 20 tablet; Refill: 0 -     Methocarbamol; Take 1 tablet (500 mg total) by mouth every 8 (eight) hours as needed for muscle spasms.  Dispense: 15 tablet; Refill: 0    Return in about 6 months (around 11/21/2022).   Marikay Alar, MD [facility]

## 2022-05-23 LAB — LIPID PANEL
Cholesterol: 227 mg/dL — ABNORMAL HIGH (ref 0–200)
HDL: 34.8 mg/dL — ABNORMAL LOW (ref >39.00)
LDL Cholesterol: 154 mg/dL — ABNORMAL HIGH (ref 0–99)
NonHDL: 192.07
Total CHOL/HDL Ratio: 7
Triglycerides: 188 mg/dL — ABNORMAL HIGH (ref 0.0–149.0)
VLDL: 37.6 mg/dL (ref 0.0–40.0)

## 2022-05-23 LAB — COMPREHENSIVE METABOLIC PANEL
ALT: 8 U/L (ref 0–53)
AST: 18 U/L (ref 0–37)
Albumin: 4.3 g/dL (ref 3.5–5.2)
Alkaline Phosphatase: 59 U/L (ref 39–117)
BUN: 9 mg/dL (ref 6–23)
CO2: 25 mEq/L (ref 19–32)
Calcium: 9.4 mg/dL (ref 8.4–10.5)
Chloride: 100 mEq/L (ref 96–112)
Creatinine, Ser: 0.79 mg/dL (ref 0.40–1.50)
GFR: 95.86 mL/min (ref >60.00)
Glucose, Bld: 79 mg/dL (ref 70–99)
Potassium: 4.7 mEq/L (ref 3.5–5.1)
Sodium: 132 mEq/L — ABNORMAL LOW (ref 135–145)
Total Bilirubin: 0.3 mg/dL (ref 0.2–1.2)
Total Protein: 7 g/dL (ref 6.0–8.3)

## 2022-05-24 ENCOUNTER — Telehealth: Payer: Self-pay

## 2022-05-24 NOTE — Telephone Encounter (Signed)
-----   Message from Eric G Sonnenberg, MD sent at 05/24/2022 11:44 AM EDT ----- Please let the patient know that his cholesterol is elevated above goal.  Has he been taking the pravastatin?  If he has been taking this I would like to increase the dose to 40 mg daily and recheck his direct LDL and hepatic function panel in 6 weeks.  His sodium level is slightly low.  How much water has he been drinking?  The 10-year ASCVD risk score (Arnett DK, et al., 2019) is: 17.8%   Values used to calculate the score:     Age: 61 years     Sex: Male     Is Non-Hispanic African American: No     Diabetic: No     Tobacco smoker: Yes     Systolic Blood Pressure: 118 mmHg     Is BP treated: No     HDL Cholesterol: 34.8 mg/dL     Total Cholesterol: 227 mg/dL  

## 2022-05-24 NOTE — Telephone Encounter (Signed)
LMTCB in regards to lab results.  

## 2022-05-27 ENCOUNTER — Telehealth: Payer: Self-pay

## 2022-05-27 NOTE — Telephone Encounter (Signed)
LMTCB regarding lab results.  

## 2022-05-27 NOTE — Telephone Encounter (Signed)
-----   Message from Eric G Sonnenberg, MD sent at 05/24/2022 11:44 AM EDT ----- Please let the patient know that his cholesterol is elevated above goal.  Has he been taking the pravastatin?  If he has been taking this I would like to increase the dose to 40 mg daily and recheck his direct LDL and hepatic function panel in 6 weeks.  His sodium level is slightly low.  How much water has he been drinking?  The 10-year ASCVD risk score (Arnett DK, et al., 2019) is: 17.8%   Values used to calculate the score:     Age: 61 years     Sex: Male     Is Non-Hispanic African American: No     Diabetic: No     Tobacco smoker: Yes     Systolic Blood Pressure: 118 mmHg     Is BP treated: No     HDL Cholesterol: 34.8 mg/dL     Total Cholesterol: 227 mg/dL  

## 2022-05-28 ENCOUNTER — Telehealth: Payer: Self-pay

## 2022-05-28 NOTE — Telephone Encounter (Signed)
-----   Message from Eric G Sonnenberg, MD sent at 05/24/2022 11:44 AM EDT ----- Please let the patient know that his cholesterol is elevated above goal.  Has he been taking the pravastatin?  If he has been taking this I would like to increase the dose to 40 mg daily and recheck his direct LDL and hepatic function panel in 6 weeks.  His sodium level is slightly low.  How much water has he been drinking?  The 10-year ASCVD risk score (Arnett DK, et al., 2019) is: 17.8%   Values used to calculate the score:     Age: 61 years     Sex: Male     Is Non-Hispanic African American: No     Diabetic: No     Tobacco smoker: Yes     Systolic Blood Pressure: 118 mmHg     Is BP treated: No     HDL Cholesterol: 34.8 mg/dL     Total Cholesterol: 227 mg/dL  

## 2022-05-28 NOTE — Telephone Encounter (Signed)
Left message to call the office back regarding his lab results. 

## 2022-05-29 ENCOUNTER — Telehealth: Payer: Self-pay

## 2022-05-29 NOTE — Telephone Encounter (Signed)
-----   Message from Glori Luis, MD sent at 05/24/2022 11:44 AM EDT ----- Please let the patient know that his cholesterol is elevated above goal.  Has he been taking the pravastatin?  If he has been taking this I would like to increase the dose to 40 mg daily and recheck his direct LDL and hepatic function panel in 6 weeks.  His sodium level is slightly low.  How much water has he been drinking?  The 10-year ASCVD risk score (Arnett DK, et al., 2019) is: 17.8%   Values used to calculate the score:     Age: 62 years     Sex: Male     Is Non-Hispanic African American: No     Diabetic: No     Tobacco smoker: Yes     Systolic Blood Pressure: 118 mmHg     Is BP treated: No     HDL Cholesterol: 34.8 mg/dL     Total Cholesterol: 227 mg/dL

## 2022-05-29 NOTE — Telephone Encounter (Signed)
Left message to call the office back regarding Dr. Purvis Sheffield reocmmendations

## 2022-06-03 ENCOUNTER — Telehealth: Payer: Self-pay | Admitting: Family Medicine

## 2022-06-03 DIAGNOSIS — E785 Hyperlipidemia, unspecified: Secondary | ICD-10-CM

## 2022-06-03 NOTE — Telephone Encounter (Signed)
Pt need a refill on cholesterol medication sent to [facility]

## 2022-06-04 NOTE — Telephone Encounter (Signed)
Patient called to see if his medication has been refilled.

## 2022-06-05 ENCOUNTER — Other Ambulatory Visit: Payer: Self-pay

## 2022-06-05 DIAGNOSIS — E785 Hyperlipidemia, unspecified: Secondary | ICD-10-CM

## 2022-06-05 MED ORDER — PRAVASTATIN SODIUM 20 MG PO TABS
20.0000 mg | ORAL_TABLET | Freq: Every day | ORAL | 1 refills | Status: DC
Start: 2022-06-05 — End: 2022-12-24

## 2022-06-05 NOTE — Telephone Encounter (Signed)
Called Patient to let him know

## 2022-06-05 NOTE — Telephone Encounter (Signed)
Last office visit 05/22/22 next office visit 10/24  Patient requesting a refill on cholesterol medication

## 2022-06-05 NOTE — Addendum Note (Signed)
Addended by: Glori Luis on: 06/05/2022 01:44 PM   Modules accepted: Orders

## 2022-06-05 NOTE — Telephone Encounter (Signed)
Sent to pharmacy. Labs ordered.  

## 2022-07-11 ENCOUNTER — Other Ambulatory Visit (INDEPENDENT_AMBULATORY_CARE_PROVIDER_SITE_OTHER): Payer: Medicaid Other

## 2022-07-11 DIAGNOSIS — E785 Hyperlipidemia, unspecified: Secondary | ICD-10-CM | POA: Diagnosis not present

## 2022-07-11 LAB — HEPATIC FUNCTION PANEL
ALT: 9 U/L (ref 0–53)
AST: 18 U/L (ref 0–37)
Albumin: 4.3 g/dL (ref 3.5–5.2)
Alkaline Phosphatase: 72 U/L (ref 39–117)
Bilirubin, Direct: 0.1 mg/dL (ref 0.0–0.3)
Total Bilirubin: 0.3 mg/dL (ref 0.2–1.2)
Total Protein: 7.5 g/dL (ref 6.0–8.3)

## 2022-07-11 LAB — LDL CHOLESTEROL, DIRECT: Direct LDL: 143 mg/dL

## 2022-11-04 ENCOUNTER — Telehealth: Payer: Self-pay | Admitting: Family Medicine

## 2022-11-05 ENCOUNTER — Ambulatory Visit: Payer: Medicaid Other | Admitting: Family Medicine

## 2022-11-22 ENCOUNTER — Ambulatory Visit: Payer: Medicaid Other | Admitting: Family Medicine

## 2022-11-25 ENCOUNTER — Ambulatory Visit: Payer: Medicaid Other | Admitting: Family Medicine

## 2022-12-13 ENCOUNTER — Telehealth: Payer: Self-pay

## 2022-12-13 DIAGNOSIS — K2100 Gastro-esophageal reflux disease with esophagitis, without bleeding: Secondary | ICD-10-CM

## 2022-12-13 MED ORDER — GABAPENTIN 300 MG PO CAPS: 300.0000 mg | ORAL_CAPSULE | Freq: Three times a day (TID) | ORAL | 0 refills | Status: DC

## 2022-12-13 MED ORDER — PANTOPRAZOLE SODIUM 40 MG PO TBEC: 40.0000 mg | DELAYED_RELEASE_TABLET | Freq: Every day | ORAL | 1 refills | Status: DC

## 2022-12-13 NOTE — Telephone Encounter (Signed)
I have sent in the protonix. I have not prescribed the gabapentin for him previously, though I can send in enough to cover until he see's his new provider later this month. It looks like he is scheduled to establish care at a different office later this month.

## 2022-12-13 NOTE — Telephone Encounter (Signed)
Spoke to Patient to let him know Dr. Birdie Sons sent in his medication to last until he sees his new provider later this month at another office.

## 2022-12-13 NOTE — Telephone Encounter (Signed)
Prescription Request  12/13/2022  LOV: Visit date not found  What is the name of the medication or equipment? gabapentin (NEURONTIN) 100 MG capsule (patient's wife states he was taking 100 MG three times per day, then Dr. Birdie Sons switched patient to 300 MG capsule) and pantoprazole (PROTONIX) 40 MG tablet  Have you contacted your pharmacy to request a refill? Yes   Which pharmacy would you like this sent to?  [facility] [address]), [place] - [address] [place]) [place] [address] Phone: [phone] Fax: [phone]   Patient notified that their request is being sent to the clinical staff for review and that they should receive a response within 2 business days.   Please advise at Mobile [phone]  Boyd Kerbs, patient's wife, states patient is out of the gabapentin and just has a few of the pantoprazole left.  Boyd Kerbs states they ride mopeds and they have to go a long way to get to the pharmacy.  Boyd Kerbs states they have to pick up another prescription and would like to pick all of them up at the same time.  Boyd Kerbs states she would like for patient to have these as soon as possible.

## 2022-12-24 ENCOUNTER — Ambulatory Visit: Payer: Medicaid Other | Admitting: Family Medicine

## 2022-12-24 ENCOUNTER — Encounter: Payer: Self-pay | Admitting: Family Medicine

## 2022-12-24 VITALS — BP 126/86 | HR 96 | Ht 70.0 in | Wt 180.0 lb

## 2022-12-24 DIAGNOSIS — K2100 Gastro-esophageal reflux disease with esophagitis, without bleeding: Secondary | ICD-10-CM

## 2022-12-24 DIAGNOSIS — D509 Iron deficiency anemia, unspecified: Secondary | ICD-10-CM

## 2022-12-24 DIAGNOSIS — F1111 Opioid abuse, in remission: Secondary | ICD-10-CM

## 2022-12-24 DIAGNOSIS — F1021 Alcohol dependence, in remission: Secondary | ICD-10-CM

## 2022-12-24 DIAGNOSIS — S069X0S Unspecified intracranial injury without loss of consciousness, sequela: Secondary | ICD-10-CM

## 2022-12-24 DIAGNOSIS — Z23 Encounter for immunization: Secondary | ICD-10-CM

## 2022-12-24 DIAGNOSIS — K7469 Other cirrhosis of liver: Secondary | ICD-10-CM

## 2022-12-24 DIAGNOSIS — N401 Enlarged prostate with lower urinary tract symptoms: Secondary | ICD-10-CM

## 2022-12-24 DIAGNOSIS — I10 Essential (primary) hypertension: Secondary | ICD-10-CM

## 2022-12-24 DIAGNOSIS — I700 Atherosclerosis of aorta: Secondary | ICD-10-CM | POA: Diagnosis not present

## 2022-12-24 DIAGNOSIS — E782 Mixed hyperlipidemia: Secondary | ICD-10-CM

## 2022-12-24 DIAGNOSIS — D235 Other benign neoplasm of skin of trunk: Secondary | ICD-10-CM

## 2022-12-24 DIAGNOSIS — F17210 Nicotine dependence, cigarettes, uncomplicated: Secondary | ICD-10-CM

## 2022-12-24 DIAGNOSIS — E559 Vitamin D deficiency, unspecified: Secondary | ICD-10-CM

## 2022-12-24 DIAGNOSIS — F17200 Nicotine dependence, unspecified, uncomplicated: Secondary | ICD-10-CM

## 2022-12-24 DIAGNOSIS — R7303 Prediabetes: Secondary | ICD-10-CM

## 2022-12-24 DIAGNOSIS — L02212 Cutaneous abscess of back [any part, except buttock]: Secondary | ICD-10-CM

## 2022-12-24 DIAGNOSIS — F203 Undifferentiated schizophrenia: Secondary | ICD-10-CM

## 2022-12-24 MED ORDER — ROSUVASTATIN CALCIUM 40 MG PO TABS: 40.0000 mg | ORAL_TABLET | Freq: Every day | ORAL | 3 refills | Status: AC

## 2022-12-24 MED ORDER — PANTOPRAZOLE SODIUM 40 MG PO TBEC
40.0000 mg | DELAYED_RELEASE_TABLET | Freq: Every day | ORAL | 3 refills | Status: DC
Start: 2022-12-24 — End: 2023-02-26

## 2022-12-24 MED ORDER — GABAPENTIN 300 MG PO CAPS: 300.0000 mg | ORAL_CAPSULE | Freq: Three times a day (TID) | ORAL | 3 refills | Status: DC

## 2022-12-24 NOTE — Assessment & Plan Note (Signed)
Chronic, stable F/b RHA Continue on high risk medications

## 2022-12-24 NOTE — Progress Notes (Signed)
New patient visit   Patient: Richard Vasquez   DOB: 1960/08/25   62 y.o. Male  MRN: [id number] Visit Date: 12/24/2022  Today's healthcare provider: Jacky Kindle, FNP   Patient presents for new patient visit to establish care.  Introduced to Publishing rights manager role and practice setting.  All questions answered.  Discussed provider/patient relationship and expectations.  Subjective    Richard Vasquez is a 62 y.o. male who presents today as a new patient to establish care.  HPI   Last seen at [facility] in [place] under Dr Birdie Sons.   Past Medical History:  Diagnosis Date   Anxiety    Arthritis    BPH (benign prostatic hyperplasia)    C2 cervical fracture (HCC) 06/12/2018   Chronic pain    Closed displaced supracondylar fracture of distal end of right femur with intracondylar extension (HCC) 06/10/2018   Closed fracture of distal end of right femur (HCC) 06/09/2018   Closed left hip fracture, initial encounter (HCC) 08/18/2018   Depression    Hepatitis C    Paranoid schizophrenia (HCC)    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[facility])    Sleep apnea    Past Surgical History:  Procedure Laterality Date   COLONOSCOPY WITH PROPOFOL N/A 05/18/2020   Procedure: COLONOSCOPY WITH PROPOFOL;  Surgeon: Midge Minium, MD;  Location: [facility] ENDOSCOPY;  Service: Endoscopy;  Laterality: N/A;   FRACTURE SURGERY     HIP PINNING,CANNULATED Left 08/18/2018   Procedure: CANNULATED HIP PINNING, Right knee aspiration;  Surgeon: Juanell Fairly, MD;  Location: [facility] ORS;  Service: Orthopedics;  Laterality: Left;   JOINT REPLACEMENT     ORIF FEMUR FRACTURE Right 06/10/2018   Procedure: OPEN REDUCTION INTERNAL FIXATION (ORIF) DISTAL FEMUR FRACTURE;  Surgeon: Roby Lofts, MD;  Location: [facility] OR;  Service: Orthopedics;  Laterality: Right;   TIBIA IM NAIL INSERTION Left 05/17/2021   Procedure: INTRAMEDULLARY NAILING OF LEFT TIBIA, STRESS EXAM OF PELVIS;  Surgeon: Roby Lofts, MD;  Location: [facility] OR;   Service: Orthopedics;  Laterality: Left;   Family Status  Relation Name Status   Mother  Deceased   Emelda Brothers  (Not Specified)   Oneal Grout  (Not Specified)  No partnership data on file   Family History  Problem Relation Age of Onset   Cancer Mother    Diabetes Mother    Diabetes Paternal Aunt    Lung cancer Paternal Uncle    Social History   Socioeconomic History   Marital status: Divorced    Spouse name: Not on file   Number of children: Not on file   Years of education: Not on file   Highest education level: 8th grade  Occupational History   Not on file  Tobacco Use   Smoking status: Every Day    Current packs/day: 0.00    Average packs/day: 1 pack/day for 20.0 years (20.0 ttl pk-yrs)    Types: Cigarettes    Start date: 02/28/2001    Last attempt to quit: 02/28/2021    Years since quitting: 1.8   Smokeless tobacco: Never  Vaping Use   Vaping status: Never Used  Substance and Sexual Activity   Alcohol use: Not Currently   Drug use: Not Currently   Sexual activity: Not on file  Other Topics Concern   Not on file  Social History Narrative   ** Merged History Encounter **       Social Determinants of Health   Financial Resource Strain: Low Risk  (  11/04/2022)   Overall Financial Resource Strain (CARDIA)    Difficulty of Paying Living Expenses: Not hard at all  Food Insecurity: No Food Insecurity (11/04/2022)   Hunger Vital Sign    Worried About Running Out of Food in the Last Year: Never true    Ran Out of Food in the Last Year: Never true  Transportation Needs: No Transportation Needs (11/04/2022)   PRAPARE - Administrator, Civil Service (Medical): No    Lack of Transportation (Non-Medical): No  Physical Activity: Unknown (11/04/2022)   Exercise Vital Sign    Days of Exercise per Week: 0 days    Minutes of Exercise per Session: Not on file  Stress: No Stress Concern Present (11/04/2022)   [employer] of Occupational Health - Occupational Stress  Questionnaire    Feeling of Stress : Not at all  Social Connections: Moderately Integrated (11/04/2022)   Social Connection and Isolation Panel [NHANES]    Frequency of Communication with Friends and Family: Three times a week    Frequency of Social Gatherings with Friends and Family: More than three times a week    Attends Religious Services: 1 to 4 times per year    Active Member of [employer] or Organizations: No    Attends Engineer, structural: Not on file    Marital Status: Living with partner   Outpatient Medications Prior to Visit  Medication Sig   Ascorbic Acid (VITAMIN C PO) Take 1 capsule by mouth daily.   buprenorphine-naloxone (SUBOXONE) 8-2 mg SUBL SL tablet Place 0.5 tablets under the tongue daily.   hydrOXYzine (VISTARIL) 25 MG capsule Take 25 mg by mouth 4 (four) times daily as needed for anxiety.   OLANZapine (ZYPREXA) 10 MG tablet Take 10mg  by mouth once daily (Patient taking differently: Take 10 mg by mouth at bedtime.)   QUEtiapine (SEROQUEL) 200 MG tablet Take 200 mg by mouth at bedtime.   QUEtiapine (SEROQUEL) 50 MG tablet Take 50 mg by mouth daily.   sertraline (ZOLOFT) 25 MG tablet Take 25 mg by mouth daily.   SUBOXONE 12-3 MG FILM Place under the tongue daily.   [DISCONTINUED] gabapentin (NEURONTIN) 300 MG capsule Take 1 capsule (300 mg total) by mouth 3 (three) times daily.   [DISCONTINUED] ibuprofen (ADVIL) 600 MG tablet Take 1 tablet (600 mg total) by mouth every 8 (eight) hours as needed for moderate pain.   [DISCONTINUED] methocarbamol (ROBAXIN) 500 MG tablet Take 1 tablet (500 mg total) by mouth every 8 (eight) hours as needed for muscle spasms.   [DISCONTINUED] pantoprazole (PROTONIX) 40 MG tablet Take 1 tablet (40 mg total) by mouth daily.   [DISCONTINUED] tadalafil (CIALIS) 10 MG tablet Take 1 tablet (10 mg total) by mouth every other day as needed for erectile dysfunction.   [DISCONTINUED] pravastatin (PRAVACHOL) 20 MG tablet Take 1 tablet (20 mg total)  by mouth daily.   No facility-administered medications prior to visit.   No Known Allergies  Immunization History  Administered Date(s) Administered   Influenza, Seasonal, Injecte, Preservative Fre 12/24/2022   Tdap 06/09/2018, 08/17/2018, 05/17/2021    Health Maintenance  Topic Date Due   Zoster Vaccines- Shingrix (1 of 2) Never done   Lung Cancer Screening  01/16/2023   Colonoscopy  05/19/2027   DTaP/Tdap/Td (4 - Td or Tdap) 05/18/2031   INFLUENZA VACCINE  Completed   Hepatitis C Screening  Completed   HIV Screening  Completed   HPV VACCINES  Aged Out   COVID-19 Vaccine  Discontinued    Patient Care Team: Jacky Kindle, FNP as PCP - General (Family Medicine) Wenda Overland, [place] as Social Worker  Review of Systems  Last CBC Lab Results  Component Value Date   WBC 9.0 02/19/2022   HGB 11.7 (L) 02/19/2022   HCT 34.6 (L) 02/19/2022   MCV 93.6 02/19/2022   MCH 30.8 02/12/2022   RDW 12.7 02/19/2022   PLT 304.0 02/19/2022   Last metabolic panel Lab Results  Component Value Date   GLUCOSE 79 05/22/2022   NA 132 (L) 05/22/2022   K 4.7 05/22/2022   CL 100 05/22/2022   CO2 25 05/22/2022   BUN 9 05/22/2022   CREATININE 0.79 05/22/2022   GFR 95.86 05/22/2022   CALCIUM 9.4 05/22/2022   PHOS 3.7 02/12/2022   PROT 7.5 07/11/2022   ALBUMIN 4.3 07/11/2022   LABGLOB 2.8 03/20/2020   AGRATIO 1.5 03/20/2020   BILITOT 0.3 07/11/2022   ALKPHOS 72 07/11/2022   AST 18 07/11/2022   ALT 9 07/11/2022   ANIONGAP 6 02/12/2022   Last lipids Lab Results  Component Value Date   CHOL 227 (H) 05/22/2022   HDL 34.80 (L) 05/22/2022   LDLCALC 154 (H) 05/22/2022   LDLDIRECT 143.0 07/11/2022   TRIG 188.0 (H) 05/22/2022   CHOLHDL 7 05/22/2022   Last hemoglobin A1c Lab Results  Component Value Date   HGBA1C 6.4 10/16/2021   Last thyroid functions Lab Results  Component Value Date   TSH 4.810 (H) 02/10/2022   Last vitamin D Lab Results  Component Value Date   VD25OH  47.30 05/17/2021   Last vitamin B12 and Folate Lab Results  Component Value Date   VITAMINB12 723 01/08/2021    Objective    BP 126/86   Pulse 96   Ht 5\' 10"  (1.778 m)   Wt 180 lb (81.6 kg)   SpO2 98%   BMI 25.83 kg/m  BP Readings from Last 3 Encounters:  12/24/22 126/86  05/22/22 118/74  02/19/22 115/70   Wt Readings from Last 3 Encounters:  12/24/22 180 lb (81.6 kg)  05/22/22 160 lb (72.6 kg)  02/19/22 155 lb 6.4 oz (70.5 kg)   SpO2 Readings from Last 3 Encounters:  12/24/22 98%  05/22/22 93%  02/19/22 92%   Physical Exam Vitals and nursing note reviewed.  Constitutional:      Appearance: Normal appearance. He is normal weight.  HENT:     Head: Normocephalic and atraumatic.  Eyes:     Conjunctiva/sclera: Conjunctivae normal.  Cardiovascular:     Rate and Rhythm: Normal rate and regular rhythm.     Pulses: Normal pulses.     Heart sounds: Normal heart sounds.  Pulmonary:     Effort: Pulmonary effort is normal.     Breath sounds: Normal breath sounds.  Musculoskeletal:        General: Normal range of motion.     Cervical back: Normal range of motion.  Skin:    General: Skin is warm and dry.     Capillary Refill: Capillary refill takes less than 2 seconds.     Findings: Erythema and lesion present.     Comments: Healing cyst excision on R upper back with new cyst/satellite lesion See photos   Neurological:     General: No focal deficit present.     Mental Status: He is alert and oriented to person, place, and time. Mental status is at baseline.  Psychiatric:        Mood  and Affect: Mood normal.        Behavior: Behavior normal.        Thought Content: Thought content normal.        Judgment: Judgment normal.    Depression Screen    12/24/2022    1:58 PM 05/22/2022    2:22 PM 02/19/2022   11:21 AM 11/01/2021   12:57 PM  PHQ 2/9 Scores  PHQ - 2 Score 0 0 2 0  PHQ- 9 Score 0 0 10    No results found for any visits on 12/24/22.  Assessment & Plan       Problem List Items Addressed This Visit       Cardiovascular and Mediastinum   Hypertension (Chronic)    Chronic, remains borderline Goal of 119/79 Recommend start of lisinopril 10      Relevant Medications   rosuvastatin (CRESTOR) 40 MG tablet   Other Relevant Orders   CBC with Differential/Platelet   Comprehensive Metabolic Panel (CMET)   TSH   Aortic atherosclerosis (HCC)    Chronic, untreated Increase statin strength Pt agreeable       Relevant Medications   rosuvastatin (CRESTOR) 40 MG tablet     Digestive   GERD (gastroesophageal reflux disease)    Chronic, stable Continue protonix 40 mg every day       Relevant Medications   pantoprazole (PROTONIX) 40 MG tablet   Other cirrhosis of liver (HCC)    Chronic, unknown Repeat CMP and GGT Reports hx of treated Hep C       Relevant Orders   Comprehensive Metabolic Panel (CMET)   Gamma GT     Nervous and Auditory   TBI (traumatic brain injury) (HCC) - Primary    Chronic, stable Does not drive a MV d/t TBI F/b psych         Other   Hyperlipidemia (Chronic)    Chronic, not at goal Repeat LP Add LPA       Relevant Medications   rosuvastatin (CRESTOR) 40 MG tablet   Other Relevant Orders   Lipid panel   Lipoprotein A (LPA)   Nicotine dependence, cigarettes, uncomplicated (Chronic)    Chronic, stable Continue to monitor use Recommend LDCT       Alcohol dependence, in remission (HCC)    Chronic, 5 years sober; congratulated       Anemia    Chronic, unknown Recommend stop BC to assist and trial NSAIDs only with food/milk and use APAP at 1000 mg q8h prn  Repeat labs       Relevant Orders   CBC with Differential/Platelet   Iron, TIBC and Ferritin Panel   Opioid abuse, in remission (HCC)    Chronic, 3 years clean; congratulated       Schizophrenia (HCC)    Chronic, stable F/b RHA Continue on high risk medications      Other Visit Diagnoses     Prediabetes       Relevant  Medications   gabapentin (NEURONTIN) 300 MG capsule   Other Relevant Orders   Hemoglobin A1c   Benign localized prostatic hyperplasia with lower urinary tract symptoms (LUTS)       Relevant Orders   PSA   Vitamin D deficiency       Relevant Orders   Vitamin D (25 hydroxy)   Tobacco dependence       Relevant Orders   Ambulatory Referral Lung Cancer Screening [facility]   Dermoid cyst of skin of back  Relevant Orders   Ambulatory referral to General Surgery   Ambulatory referral to Dermatology   Cutaneous abscess of back excluding buttocks       Relevant Orders   Anaerobic and Aerobic Culture   Ambulatory referral to Dermatology   Encounter for immunization       Relevant Orders   Flu vaccine trivalent PF, 6mos and older(Flulaval,Afluria,Fluarix,Fluzone) (Completed)      Return in about 3 months (around 03/26/2023) for chonic disease management, T2DM management.    Leilani Merl, FNP, have reviewed all documentation for this visit. The documentation on 12/24/22 for the exam, diagnosis, procedures, and orders are all accurate and complete.  Jacky Kindle, FNP  [facility] [phone] (phone) [phone] (fax)  [facility]

## 2022-12-24 NOTE — Assessment & Plan Note (Signed)
Chronic, 3 years clean; congratulated

## 2022-12-24 NOTE — Assessment & Plan Note (Signed)
Chronic, untreated Increase statin strength Pt agreeable

## 2022-12-24 NOTE — Assessment & Plan Note (Signed)
Chronic, stable Continue protonix 40 mg every day

## 2022-12-24 NOTE — Assessment & Plan Note (Signed)
Chronic, not at goal Repeat LP Add LPA

## 2022-12-24 NOTE — Assessment & Plan Note (Signed)
Chronic, stable Does not drive a MV d/t TBI F/b psych

## 2022-12-24 NOTE — Assessment & Plan Note (Signed)
Chronic, 5 years sober; congratulated

## 2022-12-24 NOTE — Assessment & Plan Note (Signed)
Chronic, remains borderline Goal of 119/79 Recommend start of lisinopril 10

## 2022-12-24 NOTE — Assessment & Plan Note (Signed)
Chronic, unknown Recommend stop BC to assist and trial NSAIDs only with food/milk and use APAP at 1000 mg q8h prn  Repeat labs

## 2022-12-24 NOTE — Assessment & Plan Note (Signed)
Chronic, unknown Repeat CMP and GGT Reports hx of treated Hep C

## 2022-12-24 NOTE — Assessment & Plan Note (Signed)
Chronic, stable Continue to monitor use Recommend LDCT

## 2022-12-25 ENCOUNTER — Other Ambulatory Visit: Payer: Self-pay | Admitting: Family Medicine

## 2022-12-25 DIAGNOSIS — D509 Iron deficiency anemia, unspecified: Secondary | ICD-10-CM

## 2022-12-25 LAB — LIPID PANEL
Chol/HDL Ratio: 4.7 {ratio} (ref 0.0–5.0)
Cholesterol, Total: 174 mg/dL (ref 100–199)
HDL: 37 mg/dL — ABNORMAL LOW (ref >39)
LDL Chol Calc (NIH): 117 mg/dL — ABNORMAL HIGH (ref 0–99)
Triglycerides: 112 mg/dL (ref 0–149)
VLDL Cholesterol Cal: 20 mg/dL (ref 5–40)

## 2022-12-25 LAB — CBC WITH DIFFERENTIAL/PLATELET
Basophils Absolute: 0.1 10*3/uL (ref 0.0–0.2)
Basos: 1 %
EOS (ABSOLUTE): 0.4 10*3/uL (ref 0.0–0.4)
Eos: 3 %
Hematocrit: 37 % — ABNORMAL LOW (ref 37.5–51.0)
Hemoglobin: 12.5 g/dL — ABNORMAL LOW (ref 13.0–17.7)
Immature Grans (Abs): 0.1 10*3/uL (ref 0.0–0.1)
Immature Granulocytes: 1 %
Lymphocytes Absolute: 2.3 10*3/uL (ref 0.7–3.1)
Lymphs: 15 %
MCH: 30.9 pg (ref 26.6–33.0)
MCHC: 33.8 g/dL (ref 31.5–35.7)
MCV: 91 fL (ref 79–97)
Monocytes Absolute: 1 10*3/uL — ABNORMAL HIGH (ref 0.1–0.9)
Monocytes: 6 %
Neutrophils Absolute: 11.6 10*3/uL — ABNORMAL HIGH (ref 1.4–7.0)
Neutrophils: 74 %
Platelets: 227 10*3/uL (ref 150–450)
RBC: 4.05 x10E6/uL — ABNORMAL LOW (ref 4.14–5.80)
RDW: 12.6 % (ref 11.6–15.4)
WBC: 15.5 10*3/uL — ABNORMAL HIGH (ref 3.4–10.8)

## 2022-12-25 LAB — ANAEROBIC AND AEROBIC CULTURE

## 2022-12-25 LAB — SPECIMEN STATUS REPORT

## 2022-12-25 LAB — COMPREHENSIVE METABOLIC PANEL
ALT: 14 [IU]/L (ref 0–44)
AST: 20 [IU]/L (ref 0–40)
Albumin: 4.5 g/dL (ref 3.9–4.9)
Alkaline Phosphatase: 81 [IU]/L (ref 44–121)
BUN/Creatinine Ratio: 11 (ref 10–24)
BUN: 9 mg/dL (ref 8–27)
Bilirubin Total: 0.2 mg/dL (ref 0.0–1.2)
CO2: 21 mmol/L (ref 20–29)
Calcium: 9.5 mg/dL (ref 8.6–10.2)
Chloride: 96 mmol/L (ref 96–106)
Creatinine, Ser: 0.85 mg/dL (ref 0.76–1.27)
Globulin, Total: 2 g/dL (ref 1.5–4.5)
Glucose: 74 mg/dL (ref 70–99)
Potassium: 5.4 mmol/L — ABNORMAL HIGH (ref 3.5–5.2)
Sodium: 135 mmol/L (ref 134–144)
Total Protein: 6.5 g/dL (ref 6.0–8.5)
eGFR: 98 mL/min/{1.73_m2} (ref >59)

## 2022-12-25 LAB — GAMMA GT: GGT: 23 [IU]/L (ref 0–65)

## 2022-12-25 LAB — LIPOPROTEIN A (LPA): Lipoprotein (a): 222.4 nmol/L — ABNORMAL HIGH (ref <75.0)

## 2022-12-25 LAB — IRON,TIBC AND FERRITIN PANEL
Ferritin: 124 ng/mL (ref 30–400)
Iron Saturation: 6 % — CL (ref 15–55)
Iron: 20 ug/dL — ABNORMAL LOW (ref 38–169)
Total Iron Binding Capacity: 323 ug/dL (ref 250–450)
UIBC: 303 ug/dL (ref 111–343)

## 2022-12-25 LAB — TSH: TSH: 2.13 u[IU]/mL (ref 0.450–4.500)

## 2022-12-25 LAB — HEMOGLOBIN A1C
Est. average glucose Bld gHb Est-mCnc: 120 mg/dL
Hgb A1c MFr Bld: 5.8 % — ABNORMAL HIGH (ref 4.8–5.6)

## 2022-12-25 LAB — VITAMIN D 25 HYDROXY (VIT D DEFICIENCY, FRACTURES): Vit D, 25-Hydroxy: 40.9 ng/mL (ref 30.0–100.0)

## 2022-12-25 LAB — PSA: Prostate Specific Ag, Serum: 1 ng/mL (ref 0.0–4.0)

## 2022-12-25 NOTE — Progress Notes (Signed)
*   referral placed to hematology to discuss anemia and low iron levels/low iron saturations. Continue to defer use of BC and only use NSAIDs if tylenol fails.  * A1c shows pre-diabetic range; improved from 1 year ago. Continue to recommend balanced, lower carb meals. Smaller meal size, adding snacks. Choosing water as drink of choice and increasing purposeful exercise.  * LDL remains elevated; continue statin to assist with reduced risk of heart attack/stroke. The 10-year ASCVD risk score (Arnett DK, et al., 2019) is: 16%

## 2022-12-30 ENCOUNTER — Inpatient Hospital Stay: Payer: MEDICAID | Attending: Internal Medicine | Admitting: Internal Medicine

## 2022-12-30 ENCOUNTER — Inpatient Hospital Stay: Payer: MEDICAID

## 2022-12-30 VITALS — BP 122/83 | HR 80 | Temp 96.8°F | Wt 178.0 lb

## 2022-12-30 DIAGNOSIS — K921 Melena: Secondary | ICD-10-CM | POA: Diagnosis not present

## 2022-12-30 DIAGNOSIS — F17210 Nicotine dependence, cigarettes, uncomplicated: Secondary | ICD-10-CM | POA: Diagnosis not present

## 2022-12-30 DIAGNOSIS — Z79899 Other long term (current) drug therapy: Secondary | ICD-10-CM | POA: Diagnosis not present

## 2022-12-30 DIAGNOSIS — D509 Iron deficiency anemia, unspecified: Secondary | ICD-10-CM | POA: Insufficient documentation

## 2022-12-30 DIAGNOSIS — G4730 Sleep apnea, unspecified: Secondary | ICD-10-CM | POA: Diagnosis not present

## 2022-12-30 NOTE — Progress Notes (Signed)
[facility]  Telephone:([phone]) [phone] Fax:(336) [phone]  ID: Richard Vasquez OB: 05-Nov-1960  MR#: [id number]  AOZ#:[id number]  Patient Care Team: Jacky Kindle, FNP as PCP - General (Family Medicine) Wenda Overland, [place] as Social Worker Michaelyn Barter, MD as Consulting Physician (Oncology)  REFERRING PROVIDER: Merita Norton, NP  REASON FOR REFERRAL: Iron deficiency anemia  HPI: Richard Vasquez is a 62 y.o. male with past medical history of hyperlipidemia, schizophrenia, TBI was referred to hematology for iron deficiency anemia.  Patient saw his primary care for routine checkup. Labs from 12/24/2022.  CBC showed hemoglobin 12.5, WBC 15.5 and platelets 227.  Iron 20, saturation 6% and ferritin of 124. Denies any prior history of iron deficiency.  Reports black stools at least since 2 years.  Had colonoscopy with Dr. Servando Snare in 2022 which showed multiple polyps.  Pathology showed benign polyps. Denies history of gastric bypass surgery. Denies NSAID use. Gained 10 pounds in past 2 months. Reports nocturia.   REVIEW OF SYSTEMS:   ROS  As per HPI. Otherwise, a complete review of systems is negative.  PAST MEDICAL HISTORY: Past Medical History:  Diagnosis Date   Anxiety    Arthritis    BPH (benign prostatic hyperplasia)    C2 cervical fracture (HCC) 06/12/2018   Chronic pain    Closed displaced supracondylar fracture of distal end of right femur with intracondylar extension (HCC) 06/10/2018   Closed fracture of distal end of right femur (HCC) 06/09/2018   Closed left hip fracture, initial encounter (HCC) 08/18/2018   Depression    Hepatitis C    Paranoid schizophrenia (HCC)    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(HCC)    Sleep apnea     PAST SURGICAL HISTORY: Past Surgical History:  Procedure Laterality Date   COLONOSCOPY WITH PROPOFOL N/A 05/18/2020   Procedure: COLONOSCOPY WITH PROPOFOL;  Surgeon: Midge Minium, MD;  Location: [facility] ENDOSCOPY;  Service:  Endoscopy;  Laterality: N/A;   FRACTURE SURGERY     HIP PINNING,CANNULATED Left 08/18/2018   Procedure: CANNULATED HIP PINNING, Right knee aspiration;  Surgeon: Juanell Fairly, MD;  Location: [facility] ORS;  Service: Orthopedics;  Laterality: Left;   JOINT REPLACEMENT     ORIF FEMUR FRACTURE Right 06/10/2018   Procedure: OPEN REDUCTION INTERNAL FIXATION (ORIF) DISTAL FEMUR FRACTURE;  Surgeon: Roby Lofts, MD;  Location: [facility] OR;  Service: Orthopedics;  Laterality: Right;   TIBIA IM NAIL INSERTION Left 05/17/2021   Procedure: INTRAMEDULLARY NAILING OF LEFT TIBIA, STRESS EXAM OF PELVIS;  Surgeon: Roby Lofts, MD;  Location: [facility] OR;  Service: Orthopedics;  Laterality: Left;    FAMILY HISTORY: Family History  Problem Relation Age of Onset   Cancer Mother    Diabetes Mother    Diabetes Paternal Aunt    Lung cancer Paternal Uncle     HEALTH MAINTENANCE: Social History   Tobacco Use   Smoking status: Every Day    Current packs/day: 0.00    Average packs/day: 1 pack/day for 20.0 years (20.0 ttl pk-yrs)    Types: Cigarettes    Start date: 02/28/2001    Last attempt to quit: 02/28/2021    Years since quitting: 1.8   Smokeless tobacco: Never  Vaping Use   Vaping status: Never Used  Substance Use Topics   Alcohol use: Not Currently   Drug use: Not Currently     No Known Allergies  Current Outpatient Medications  Medication Sig Dispense Refill   Ascorbic Acid (VITAMIN C PO) Take  1 capsule by mouth daily.     buprenorphine-naloxone (SUBOXONE) 8-2 mg SUBL SL tablet Place 0.5 tablets under the tongue daily.     gabapentin (NEURONTIN) 300 MG capsule Take 1 capsule (300 mg total) by mouth 3 (three) times daily. 270 capsule 3   hydrOXYzine (VISTARIL) 25 MG capsule Take 25 mg by mouth 4 (four) times daily as needed for anxiety.     OLANZapine (ZYPREXA) 10 MG tablet Take 10mg  by mouth once daily (Patient taking differently: Take 10 mg by mouth at bedtime.) 30 tablet 0   pantoprazole (PROTONIX)  40 MG tablet Take 1 tablet (40 mg total) by mouth daily. 90 tablet 3   QUEtiapine (SEROQUEL) 200 MG tablet Take 200 mg by mouth at bedtime.     QUEtiapine (SEROQUEL) 50 MG tablet Take 50 mg by mouth daily.     rosuvastatin (CRESTOR) 40 MG tablet Take 1 tablet (40 mg total) by mouth daily. 90 tablet 3   sertraline (ZOLOFT) 25 MG tablet Take 25 mg by mouth daily.     SUBOXONE 12-3 MG FILM Place under the tongue daily.     No current facility-administered medications for this visit.    OBJECTIVE: Vitals:   12/30/22 1403  BP: 122/83  Pulse: 80  Temp: (!) 96.8 F (36 C)  SpO2: 94%     Body mass index is 25.54 kg/m.      General: Well-developed, well-nourished, no acute distress. Eyes: Pink conjunctiva, anicteric sclera. HEENT: Normocephalic, moist mucous membranes, clear oropharnyx. Lungs: Clear to auscultation bilaterally. Heart: Regular rate and rhythm. No rubs, murmurs, or gallops. Abdomen: Soft, nontender, nondistended. No organomegaly noted, normoactive bowel sounds. Musculoskeletal: No edema, cyanosis, or clubbing. Neuro: Alert, answering all questions appropriately. Cranial nerves grossly intact. Skin: No rashes or petechiae noted. Psych: Normal affect. Lymphatics: No cervical, calvicular, axillary or inguinal LAD.   LAB RESULTS:  Lab Results  Component Value Date   NA 135 12/24/2022   K 5.4 (H) 12/24/2022   CL 96 12/24/2022   CO2 21 12/24/2022   GLUCOSE 74 12/24/2022   BUN 9 12/24/2022   CREATININE 0.85 12/24/2022   CALCIUM 9.5 12/24/2022   PROT 6.5 12/24/2022   ALBUMIN 4.5 12/24/2022   AST 20 12/24/2022   ALT 14 12/24/2022   ALKPHOS 81 12/24/2022   BILITOT 0.2 12/24/2022   GFRNONAA >60 02/12/2022   GFRAA 119 03/20/2020    Lab Results  Component Value Date   WBC 15.5 (H) 12/24/2022   NEUTROABS 11.6 (H) 12/24/2022   HGB 12.5 (L) 12/24/2022   HCT 37.0 (L) 12/24/2022   MCV 91 12/24/2022   PLT 227 12/24/2022    Lab Results  Component Value Date    TIBC 323 12/24/2022   TIBC 343.0 03/22/2021   TIBC 328 05/09/2020   FERRITIN 124 12/24/2022   FERRITIN 72.6 03/22/2021   FERRITIN 214 05/09/2020   IRONPCTSAT 6 (LL) 12/24/2022   IRONPCTSAT 14.9 (L) 03/22/2021   IRONPCTSAT 19 05/09/2020     STUDIES: No results found.  ASSESSMENT AND PLAN:   Richard Vasquez is a 62 y.o. male with pmh of hyperlipidemia, schizophrenia, TBI was referred to hematology for iron deficiency anemia.  # Iron deficiency anemia -Of unknown etiology.  Labs reviewed from 12/24/2022.  Hemoglobin 12.5.  Iron 20, saturation 6% and ferritin 124.  Labs are consistent with iron deficiency anemia. -Patient reports black stools but has been ongoing for 2 years.  Colonoscopy in 2022 by Dr. Servando Snare showed multiple polyps.  Pathology showed benign polyps.  -Discussed lab work with the patient and his wife.  We discussed about oral iron pill 3 times a week.  Side effects such as constipation, diarrhea, nausea, upset stomach, black stool was discussed.  Alternative would be IV Venofer weekly x 5 doses.  Side effects such as back pain, chest pain, nausea, allergic reaction was discussed.  Patient would like to proceed with IV Venofer due to quicker onset of action.  Will schedule.  Will make a referral to GI for workup of iron deficiency anemia.  Orders Placed This Encounter  Procedures   CBC with Differential/Platelet   Iron and TIBC   Ferritin   Vitamin B12   Ambulatory referral to Gastroenterology   RTC in 3 months for MD visit, labs  Patient expressed understanding and was in agreement with this plan. He also understands that He can call clinic at any time with any questions, concerns, or complaints.   I spent a total of 45 minutes reviewing chart data, face-to-face evaluation with the patient, counseling and coordination of care as detailed above.  Michaelyn Barter, MD   12/30/2022 2:40 PM

## 2022-12-30 NOTE — Progress Notes (Signed)
Dr. Suzie Portela told patient that he is anemic, he has went from 168 lbs to 178 lbs. Patient is having urgency with urination. Fatigue and being more tired than normal.

## 2023-01-02 ENCOUNTER — Ambulatory Visit (INDEPENDENT_AMBULATORY_CARE_PROVIDER_SITE_OTHER): Payer: MEDICAID | Admitting: General Surgery

## 2023-01-02 ENCOUNTER — Telehealth: Payer: Self-pay

## 2023-01-02 ENCOUNTER — Encounter: Payer: Self-pay | Admitting: General Surgery

## 2023-01-02 ENCOUNTER — Encounter: Payer: Self-pay | Admitting: Internal Medicine

## 2023-01-02 VITALS — BP 120/79 | HR 71 | Temp 98.2°F | Ht 70.0 in | Wt 178.0 lb

## 2023-01-02 DIAGNOSIS — D171 Benign lipomatous neoplasm of skin and subcutaneous tissue of trunk: Secondary | ICD-10-CM

## 2023-01-02 NOTE — Telephone Encounter (Signed)
[phone] Pt wife call per Dr. Suzie Portela pt need to have EGD to see if he has a bleed any where? Do pt need to see MD first or can he have EDG without seeing MD? He's not having any problems.

## 2023-01-02 NOTE — Patient Instructions (Addendum)
We will schedule you for an ultrasound of this area on his back.  If normal we can remove this in the office.  We will schedule you for a follow up appointment here with anticipation of removal of the area at that time.   We have scheduled you for an ultrasound at [facility] on 01/09/23. You will need to arrive at the [facility] entrance at 10:45 am.

## 2023-01-02 NOTE — Telephone Encounter (Signed)
Patient will need an appointment. He is a Dr Annabell Sabal patient and last seen on 06/21/2021. This issues have not been address so an appointment is require.

## 2023-01-06 NOTE — Progress Notes (Signed)
Patient ID: Richard Vasquez, male   DOB: July 26, 1960, 62 y.o.   MRN: [id number] CC: Right back lipoma History of Present Illness Richard Vasquez is a 62 y.o. male with medical history as below who is on IV iron infusions who presents with right back lipoma.  The patient reports that several weeks ago he had an infection on his right back and had this I&D.  This site is healing well but he says that a lump appeared right next to this.  He denies any drainage from this.  He denies any overlying erythema.  He does say that it has grown rapidly.  It does not limit his range of motion although he does have some discomfort when he lays on it..  Past Medical History Past Medical History:  Diagnosis Date   Anxiety    Arthritis    BPH (benign prostatic hyperplasia)    C2 cervical fracture (HCC) 06/12/2018   Chronic pain    Closed displaced supracondylar fracture of distal end of right femur with intracondylar extension (HCC) 06/10/2018   Closed fracture of distal end of right femur (HCC) 06/09/2018   Closed left hip fracture, initial encounter (HCC) 08/18/2018   Depression    Hepatitis C    Paranoid schizophrenia (HCC)    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[facility])    Sleep apnea        Past Surgical History:  Procedure Laterality Date   COLONOSCOPY WITH PROPOFOL N/A 05/18/2020   Procedure: COLONOSCOPY WITH PROPOFOL;  Surgeon: Midge Minium, MD;  Location: [facility] ENDOSCOPY;  Service: Endoscopy;  Laterality: N/A;   FRACTURE SURGERY     HIP PINNING,CANNULATED Left 08/18/2018   Procedure: CANNULATED HIP PINNING, Right knee aspiration;  Surgeon: Juanell Fairly, MD;  Location: [facility] ORS;  Service: Orthopedics;  Laterality: Left;   JOINT REPLACEMENT     ORIF FEMUR FRACTURE Right 06/10/2018   Procedure: OPEN REDUCTION INTERNAL FIXATION (ORIF) DISTAL FEMUR FRACTURE;  Surgeon: Roby Lofts, MD;  Location: [facility] OR;  Service: Orthopedics;  Laterality: Right;   TIBIA IM NAIL INSERTION Left 05/17/2021   Procedure:  INTRAMEDULLARY NAILING OF LEFT TIBIA, STRESS EXAM OF PELVIS;  Surgeon: Roby Lofts, MD;  Location: [facility] OR;  Service: Orthopedics;  Laterality: Left;    No Known Allergies  Current Outpatient Medications  Medication Sig Dispense Refill   Ascorbic Acid (VITAMIN C PO) Take 1 capsule by mouth daily.     gabapentin (NEURONTIN) 300 MG capsule Take 1 capsule (300 mg total) by mouth 3 (three) times daily. 270 capsule 3   hydrOXYzine (VISTARIL) 25 MG capsule Take 25 mg by mouth 4 (four) times daily as needed for anxiety.     OLANZapine (ZYPREXA) 10 MG tablet Take 10mg  by mouth once daily (Patient taking differently: Take 10 mg by mouth at bedtime.) 30 tablet 0   pantoprazole (PROTONIX) 40 MG tablet Take 1 tablet (40 mg total) by mouth daily. 90 tablet 3   QUEtiapine (SEROQUEL) 200 MG tablet Take 200 mg by mouth at bedtime.     QUEtiapine (SEROQUEL) 50 MG tablet Take 50 mg by mouth daily.     rosuvastatin (CRESTOR) 40 MG tablet Take 1 tablet (40 mg total) by mouth daily. 90 tablet 3   sertraline (ZOLOFT) 25 MG tablet Take 25 mg by mouth daily.     SUBOXONE 12-3 MG FILM Place under the tongue daily.     No current facility-administered medications for this visit.    Family History Family History  Problem Relation Age of Onset   Cancer Mother    Diabetes Mother    Diabetes Paternal Aunt    Lung cancer Paternal Uncle        Social History Social History   Tobacco Use   Smoking status: Every Day    Current packs/day: 0.50    Average packs/day: 1 pack/day for 21.9 years (21.4 ttl pk-yrs)    Types: Cigarettes    Start date: 02/28/2001    Passive exposure: Past   Smokeless tobacco: Never  Vaping Use   Vaping status: Never Used  Substance Use Topics   Alcohol use: Not Currently   Drug use: Not Currently        ROS Full ROS of systems performed and is otherwise negative there than what is stated in the HPI  Physical Exam Blood pressure 120/79, pulse 71, temperature 98.2 F (36.8  C), height 5\' 10"  (1.778 m), weight 178 lb (80.7 kg), SpO2 97%.  No acute distress, PERRLA, normal work of breathing on room air, moving all extremities spontaneously, alert and oriented x 3 On his right back there is an area of incision and drainage.  This is healing well with some reactive erythema.  Just medial and superior to this there is a approximately 6 cm soft tissue mass.  This is mobile and does not feel to be adherent to the underlying structures.  There is no overlying skin changes and he does not have pain when palpated.  Data Reviewed I reviewed his recent visits.  His labs are significant for iron deficiency and anemia Reviewed his imaging.  It does look like there was a soft tissue mass present in December of last year although he denies this. I have personally reviewed the patient's imaging and medical records.    Assessment/Plan    62 year old with soft tissue mass in the right back who says that it has been there only for the last several weeks and it has grown rapidly in size.  He does have some soft tissue mass on a CT done about a year ago.  However he denies that it was there at that time.  I think that an abundance of caution we should repeat an ultrasound to make sure to has no concerning features.  This will not change whether I resected or not just my approach.  I did discuss with him that we will plan to resect this in the office.  I discussed with him the risk, benefits alternatives of the procedure including risk of infection, bleeding as well as recurrence of the disease and possible malignancy.  I discussed with him that no matter what it looks like we will send to the lab for evaluation and after we remove it.  We will attempt to schedule this on one of the days he gets an iron infusion.  Plan   Baker Pierini, M.D. [facility]

## 2023-01-08 ENCOUNTER — Other Ambulatory Visit: Payer: Self-pay

## 2023-01-08 DIAGNOSIS — Z87891 Personal history of nicotine dependence: Secondary | ICD-10-CM

## 2023-01-08 DIAGNOSIS — F17210 Nicotine dependence, cigarettes, uncomplicated: Secondary | ICD-10-CM

## 2023-01-08 DIAGNOSIS — Z122 Encounter for screening for malignant neoplasm of respiratory organs: Secondary | ICD-10-CM

## 2023-01-09 ENCOUNTER — Inpatient Hospital Stay: Payer: MEDICAID

## 2023-01-09 ENCOUNTER — Ambulatory Visit
Admission: RE | Admit: 2023-01-09 | Discharge: 2023-01-09 | Disposition: A | Discharge status: Home / Self Care | Payer: MEDICAID | Source: Ambulatory Visit | Attending: General Surgery | Admitting: General Surgery

## 2023-01-09 VITALS — BP 103/45 | HR 68 | Temp 96.3°F | Resp 18

## 2023-01-09 DIAGNOSIS — D509 Iron deficiency anemia, unspecified: Secondary | ICD-10-CM

## 2023-01-09 DIAGNOSIS — D171 Benign lipomatous neoplasm of skin and subcutaneous tissue of trunk: Secondary | ICD-10-CM | POA: Diagnosis present

## 2023-01-09 MED ORDER — SODIUM CHLORIDE 0.9% FLUSH
10.0000 mL | Freq: Once | INTRAVENOUS | Status: AC | PRN
  Administered 2023-01-09: 10 mL
  Filled 2023-01-09: qty 10

## 2023-01-09 MED ORDER — IRON SUCROSE 20 MG/ML IV SOLN
200.0000 mg | Freq: Once | INTRAVENOUS | Status: AC
  Administered 2023-01-09: 200 mg via INTRAVENOUS
  Filled 2023-01-09: qty 10

## 2023-01-09 NOTE — Patient Instructions (Signed)
Iron Sucrose Injection What is this medication? IRON SUCROSE (EYE ern SOO krose) treats low levels of iron (iron deficiency anemia) in people with kidney disease. Iron is a mineral that plays an important role in making red blood cells, which carry oxygen from your lungs to the rest of your body. This medicine may be used for other purposes; ask your health care provider or pharmacist if you have questions. COMMON BRAND NAME(S): Venofer What should I tell my care team before I take this medication? They need to know if you have any of these conditions: Anemia not caused by low iron levels Heart disease High levels of iron in the blood Kidney disease Liver disease An unusual or allergic reaction to iron, other medications, foods, dyes, or preservatives Pregnant or trying to get pregnant Breastfeeding How should I use this medication? This medication is for infusion into a vein. It is given in a hospital or clinic setting. Talk to your care team about the use of this medication in children. While this medication may be prescribed for children as young as 2 years for selected conditions, precautions do apply. Overdosage: If you think you have taken too much of this medicine contact a poison control center or emergency room at once. NOTE: This medicine is only for you. Do not share this medicine with others. What if I miss a dose? Keep appointments for follow-up doses. It is important not to miss your dose. Call your care team if you are unable to keep an appointment. What may interact with this medication? Do not take this medication with any of the following: Deferoxamine Dimercaprol Other iron products This medication may also interact with the following: Chloramphenicol Deferasirox This list may not describe all possible interactions. Give your health care provider a list of all the medicines, herbs, non-prescription drugs, or dietary supplements you use. Also tell them if you smoke,  drink alcohol, or use illegal drugs. Some items may interact with your medicine. What should I watch for while using this medication? Visit your care team regularly. Tell your care team if your symptoms do not start to get better or if they get worse. You may need blood work done while you are taking this medication. You may need to follow a special diet. Talk to your care team. Foods that contain iron include: whole grains/cereals, dried fruits, beans, or peas, leafy green vegetables, and organ meats (liver, kidney). What side effects may I notice from receiving this medication? Side effects that you should report to your care team as soon as possible: Allergic reactions--skin rash, itching, hives, swelling of the face, lips, tongue, or throat Low blood pressure--dizziness, feeling faint or lightheaded, blurry vision Shortness of breath Side effects that usually do not require medical attention (report to your care team if they continue or are bothersome): Flushing Headache Joint pain Muscle pain Nausea Pain, redness, or irritation at injection site This list may not describe all possible side effects. Call your doctor for medical advice about side effects. You may report side effects to FDA at 1-800-FDA-1088. Where should I keep my medication? This medication is given in a hospital or clinic. It will not be stored at home. NOTE: This sheet is a summary. It may not cover all possible information. If you have questions about this medicine, talk to your doctor, pharmacist, or health care provider.  2024 Elsevier/Gold Standard (2022-06-21 00:00:00)

## 2023-01-13 ENCOUNTER — Telehealth: Payer: Self-pay | Admitting: *Deleted

## 2023-01-13 ENCOUNTER — Encounter: Payer: MEDICAID | Admitting: Adult Health

## 2023-01-13 NOTE — Telephone Encounter (Signed)
Sdmv cancelled per request - letter sent for rescheduling.  LDCT also cancelled since sdmv was not completed

## 2023-01-13 NOTE — Telephone Encounter (Signed)
Can not  come in too many appts today. Please call PENNY to resched. He does not answ his phone.

## 2023-01-14 ENCOUNTER — Inpatient Hospital Stay: Payer: MEDICAID

## 2023-01-14 VITALS — BP 109/79 | HR 82 | Temp 95.3°F | Resp 18

## 2023-01-14 DIAGNOSIS — D509 Iron deficiency anemia, unspecified: Secondary | ICD-10-CM | POA: Diagnosis not present

## 2023-01-14 MED ORDER — SODIUM CHLORIDE 0.9% FLUSH
10.0000 mL | Freq: Two times a day (BID) | INTRAVENOUS | Status: DC
Start: 2023-01-14 — End: 2023-01-14
  Administered 2023-01-14: 10 mL via INTRAVENOUS
  Filled 2023-01-14: qty 10

## 2023-01-14 MED ORDER — IRON SUCROSE 20 MG/ML IV SOLN
200.0000 mg | Freq: Once | INTRAVENOUS | Status: AC
  Administered 2023-01-14: 200 mg via INTRAVENOUS

## 2023-01-14 NOTE — Patient Instructions (Signed)
Iron Sucrose Injection What is this medication? IRON SUCROSE (EYE ern SOO krose) treats low levels of iron (iron deficiency anemia) in people with kidney disease. Iron is a mineral that plays an important role in making red blood cells, which carry oxygen from your lungs to the rest of your body. This medicine may be used for other purposes; ask your health care provider or pharmacist if you have questions. COMMON BRAND NAME(S): Venofer What should I tell my care team before I take this medication? They need to know if you have any of these conditions: Anemia not caused by low iron levels Heart disease High levels of iron in the blood Kidney disease Liver disease An unusual or allergic reaction to iron, other medications, foods, dyes, or preservatives Pregnant or trying to get pregnant Breastfeeding How should I use this medication? This medication is for infusion into a vein. It is given in a hospital or clinic setting. Talk to your care team about the use of this medication in children. While this medication may be prescribed for children as young as 2 years for selected conditions, precautions do apply. Overdosage: If you think you have taken too much of this medicine contact a poison control center or emergency room at once. NOTE: This medicine is only for you. Do not share this medicine with others. What if I miss a dose? Keep appointments for follow-up doses. It is important not to miss your dose. Call your care team if you are unable to keep an appointment. What may interact with this medication? Do not take this medication with any of the following: Deferoxamine Dimercaprol Other iron products This medication may also interact with the following: Chloramphenicol Deferasirox This list may not describe all possible interactions. Give your health care provider a list of all the medicines, herbs, non-prescription drugs, or dietary supplements you use. Also tell them if you smoke,  drink alcohol, or use illegal drugs. Some items may interact with your medicine. What should I watch for while using this medication? Visit your care team regularly. Tell your care team if your symptoms do not start to get better or if they get worse. You may need blood work done while you are taking this medication. You may need to follow a special diet. Talk to your care team. Foods that contain iron include: whole grains/cereals, dried fruits, beans, or peas, leafy green vegetables, and organ meats (liver, kidney). What side effects may I notice from receiving this medication? Side effects that you should report to your care team as soon as possible: Allergic reactions--skin rash, itching, hives, swelling of the face, lips, tongue, or throat Low blood pressure--dizziness, feeling faint or lightheaded, blurry vision Shortness of breath Side effects that usually do not require medical attention (report to your care team if they continue or are bothersome): Flushing Headache Joint pain Muscle pain Nausea Pain, redness, or irritation at injection site This list may not describe all possible side effects. Call your doctor for medical advice about side effects. You may report side effects to FDA at 1-800-FDA-1088. Where should I keep my medication? This medication is given in a hospital or clinic. It will not be stored at home. NOTE: This sheet is a summary. It may not cover all possible information. If you have questions about this medicine, talk to your doctor, pharmacist, or health care provider.  2024 Elsevier/Gold Standard (2022-06-21 00:00:00)

## 2023-01-20 ENCOUNTER — Inpatient Hospital Stay: Payer: MEDICAID

## 2023-01-23 ENCOUNTER — Ambulatory Visit (INDEPENDENT_AMBULATORY_CARE_PROVIDER_SITE_OTHER): Payer: MEDICAID | Admitting: General Surgery

## 2023-01-23 VITALS — BP 127/78 | HR 98 | Temp 98.2°F | Ht 70.0 in | Wt 179.0 lb

## 2023-01-23 DIAGNOSIS — D171 Benign lipomatous neoplasm of skin and subcutaneous tissue of trunk: Secondary | ICD-10-CM

## 2023-01-23 NOTE — Progress Notes (Signed)
Procedure Note  Preoperative diagnosis: Right back lipoma Postoperative diagnosis: Same Procedure: Excision of back lipoma measuring approximately 5 cm Surgeon: Baker Pierini  After informed consent was obtained the patient was placed prone on our procedural room table.  His right back was then prepped and draped in the usual sterile fashion.  Patient comfort was obtained with 1% lidocaine with epinephrine.  A surgical timeout was called identifying the correct patient, site, side and procedure.  An incision was made over the lipoma.  This was dissected from the subcutaneous tissue and down to the fat.  There was multilobulated projections of the lipoma and it was removed in 3 separate pieces.  It was removed fully and there is no additional projections within the cavity.  Hemostasis within the cavity was obtained.  The subcutaneous tissue was then closed with a 3-0 Vicryl and the skin was then closed with a 4-0 Monocryl.  The skin was dressed with glue.  The patient tolerated the procedure well

## 2023-01-23 NOTE — Patient Instructions (Addendum)
Today we have removed a Lipoma in our office. Please see information below regarding this type of tumor.  You are free to shower tomorrow evening. Do not scrub at the area.  Do not lift your right hand very far over your head for the next week if possible.  You have glue on your skin and sutures under the skin. The glue will come off on it's own in 10-14 days. You may shower normally until this occurs but do not submerge.  Please use Tylenol or Ibuprofen for pain as needed. You may use ice to the area 3-4 times today and tomorrow for any achiness.   We will see you back in 7-10 days to ensure that this has healed and to review the final pathology. Please see your appointment below. You may continue your regular activities right away but if you are having pain while doing something, stop what you are doing and try this activity once again in 3 days. Please call our office with any questions or concerns prior to your appointment.   Lipoma Removal Lipoma removal is a surgical procedure to remove a noncancerous (benign) tumor that is made up of fat cells (lipoma). Most lipomas are small and painless and do not require treatment. They can form in many areas of the body but are most common under the skin of the back, shoulders, arms, and thighs. You may need lipoma removal if you have a lipoma that is large, growing, or causing discomfort. Lipoma removal may also be done for cosmetic reasons. Tell a health care provider about: Any allergies you have. All medicines you are taking, including vitamins, herbs, eye drops, creams, and over-the-counter medicines. Any problems you or family members have had with anesthetic medicines. Any blood disorders you have. Any surgeries you have had. Any medical conditions you have. Whether you are pregnant or may be pregnant. What are the risks? Generally, this is a safe procedure. However, problems may occur, including: Infection. Bleeding. Allergic reactions  to medicines. Damage to nerves or blood vessels near the lipoma. Scarring.  Medicines Ask your health care provider about: Changing or stopping your regular medicines. This is especially important if you are taking diabetes medicines or blood thinners. Taking medicines such as aspirin and ibuprofen. These medicines can thin your blood. Do not take these medicines before your procedure if your health care provider instructs you not to. You may be given antibiotic medicine to help prevent infection. General instructions Ask your health care provider how your surgical site will be marked or identified. You will have a physical exam. Your health care provider will check the size of the lipoma and whether it can be moved easily.  What happens during the procedure? To reduce your risk of infection: Your health care team will wash or sanitize their hands. Your skin will be washed with surgical soap. You will be given the following: A medicine to numb the area (local anesthetic). An incision will be made over the lipoma or very near the lipoma. The incision may be made in a natural skin line or crease. Tissues, nerves, and blood vessels near the lipoma will be moved out of the way. The lipoma and the capsule that surrounds it will be separated from the surrounding tissues. The lipoma will be removed. The incision may be closed with stitches and surgical glue

## 2023-01-25 ENCOUNTER — Other Ambulatory Visit: Payer: Self-pay

## 2023-01-25 ENCOUNTER — Emergency Department: Payer: MEDICAID

## 2023-01-25 DIAGNOSIS — K5900 Constipation, unspecified: Secondary | ICD-10-CM | POA: Diagnosis not present

## 2023-01-25 DIAGNOSIS — R109 Unspecified abdominal pain: Secondary | ICD-10-CM | POA: Diagnosis present

## 2023-01-25 DIAGNOSIS — J189 Pneumonia, unspecified organism: Secondary | ICD-10-CM | POA: Insufficient documentation

## 2023-01-25 LAB — COMPREHENSIVE METABOLIC PANEL
ALT: 13 U/L (ref 0–44)
AST: 22 U/L (ref 15–41)
Albumin: 3.3 g/dL — ABNORMAL LOW (ref 3.5–5.0)
Alkaline Phosphatase: 54 U/L (ref 38–126)
Anion gap: 11 (ref 5–15)
BUN: 14 mg/dL (ref 8–23)
CO2: 23 mmol/L (ref 22–32)
Calcium: 8.3 mg/dL — ABNORMAL LOW (ref 8.9–10.3)
Chloride: 98 mmol/L (ref 98–111)
Creatinine, Ser: 1.06 mg/dL (ref 0.61–1.24)
GFR, Estimated: >60 mL/min (ref >60)
Glucose, Bld: 177 mg/dL — ABNORMAL HIGH (ref 70–99)
Potassium: 3.8 mmol/L (ref 3.5–5.1)
Sodium: 132 mmol/L — ABNORMAL LOW (ref 135–145)
Total Bilirubin: 0.4 mg/dL (ref <1.2)
Total Protein: 6.5 g/dL (ref 6.5–8.1)

## 2023-01-25 LAB — CBC
HCT: 30.5 % — ABNORMAL LOW (ref 39.0–52.0)
Hemoglobin: 10.6 g/dL — ABNORMAL LOW (ref 13.0–17.0)
MCH: 30.7 pg (ref 26.0–34.0)
MCHC: 34.8 g/dL (ref 30.0–36.0)
MCV: 88.4 fL (ref 80.0–100.0)
Platelets: 130 10*3/uL — ABNORMAL LOW (ref 150–400)
RBC: 3.45 MIL/uL — ABNORMAL LOW (ref 4.22–5.81)
RDW: 13.3 % (ref 11.5–15.5)
WBC: 15.7 10*3/uL — ABNORMAL HIGH (ref 4.0–10.5)
nRBC: 0 % (ref 0.0–0.2)

## 2023-01-25 LAB — TROPONIN I (HIGH SENSITIVITY): Troponin I (High Sensitivity): 5 ng/L (ref <18)

## 2023-01-25 LAB — LIPASE, BLOOD: Lipase: 22 U/L (ref 11–51)

## 2023-01-25 NOTE — ED Notes (Addendum)
Pt family came to desk stating pt's throat was burning from indigestion and that he felt he was going to throw up. RN was notified.

## 2023-01-25 NOTE — ED Triage Notes (Addendum)
Pt to ED via EMS from home, pt c/o abd pain intermittently for a few weeks. Pt reports today pain has been constant. Pt denies n/v, denies dysuria. Pt had back surgery 2 days ago, to remove a lipoma. Pt reports abd swelling, pt is 89% on room air. No prior oxygen requirements. Pt has hx anemia, missed last iron infusion

## 2023-01-26 ENCOUNTER — Emergency Department
Admission: EM | Admit: 2023-01-26 | Discharge: 2023-01-26 | Disposition: A | Discharge status: Home / Self Care | Payer: MEDICAID | Attending: Emergency Medicine | Admitting: Emergency Medicine

## 2023-01-26 ENCOUNTER — Other Ambulatory Visit: Payer: Self-pay

## 2023-01-26 ENCOUNTER — Emergency Department: Payer: MEDICAID

## 2023-01-26 DIAGNOSIS — J189 Pneumonia, unspecified organism: Secondary | ICD-10-CM

## 2023-01-26 DIAGNOSIS — K5900 Constipation, unspecified: Secondary | ICD-10-CM

## 2023-01-26 MED ORDER — IOHEXOL 300 MG/ML  SOLN
100.0000 mL | Freq: Once | INTRAMUSCULAR | Status: AC | PRN
  Administered 2023-01-26: 100 mL via INTRAVENOUS

## 2023-01-26 MED ORDER — CEPHALEXIN 500 MG PO CAPS: 500.0000 mg | ORAL_CAPSULE | Freq: Two times a day (BID) | ORAL | 0 refills | Status: DC

## 2023-01-26 MED ORDER — AZITHROMYCIN 500 MG PO TABS
500.0000 mg | ORAL_TABLET | Freq: Once | ORAL | Status: AC
  Administered 2023-01-26: 500 mg via ORAL
  Filled 2023-01-26: qty 1

## 2023-01-26 MED ORDER — AZITHROMYCIN 250 MG PO TABS: ORAL_TABLET | ORAL | 0 refills | Status: AC

## 2023-01-26 MED ORDER — CEPHALEXIN 500 MG PO CAPS
500.0000 mg | ORAL_CAPSULE | Freq: Once | ORAL | Status: AC
  Administered 2023-01-26: 500 mg via ORAL
  Filled 2023-01-26: qty 1

## 2023-01-26 MED ORDER — ALUM & MAG HYDROXIDE-SIMETH 200-200-20 MG/5ML PO SUSP
30.0000 mL | Freq: Once | ORAL | Status: AC
  Administered 2023-01-26: 30 mL via ORAL
  Filled 2023-01-26: qty 30

## 2023-01-26 NOTE — ED Provider Notes (Signed)
[facility] Provider Note    Event Date/Time   First MD Initiated Contact with Patient 01/26/23 [phone]     (approximate)  History   Chief Complaint: Abdominal Pain  HPI  Richard Vasquez is a 62 y.o. male with a past medical history of anxiety, chronic pain, presents to the emergency department for abdominal pain and distention.  According to the patient for the past month or so he has been experiencing abdominal pain and now distention.  No vomiting, no diarrhea.  They do have an appointment with GI medicine but is not until 14 January.  Patient states tonight he experienced more increased pain so they brought him to the emergency department for evaluation.  Physical Exam   Triage Vital Signs: ED Triage Vitals  Encounter Vitals Group     BP 01/25/23 2126 99/77     Systolic BP Percentile --      Diastolic BP Percentile --      Pulse Rate 01/25/23 2126 (!) 103     Resp 01/25/23 2126 18     Temp 01/25/23 2126 (!) 97.5 F (36.4 C)     Temp Source 01/25/23 2126 Oral     SpO2 01/25/23 2126 94 %     Weight 01/25/23 2124 180 lb (81.6 kg)     Height 01/25/23 2124 5\' 10"  (1.778 m)     Head Circumference --      Peak Flow --      Pain Score 01/25/23 2124 4     Pain Loc --      Pain Education --      Exclude from Growth Chart --     Most recent vital signs: Vitals:   01/25/23 2126 01/26/23 0042  BP: 99/77 117/79  Pulse: (!) 103 98  Resp: 18 17  Temp: (!) 97.5 F (36.4 C) 98.1 F (36.7 C)  SpO2: 94% 93%    General: Awake, no distress.  CV:  Good peripheral perfusion.  Regular rate and rhythm  Resp:  Normal effort.  Equal breath sounds bilaterally.  Abd:  Minimal distention.  Soft, states mild tenderness to right mid to right lower quadrant.  Otherwise benign abdomen without rebound or guarding  ED Results / Procedures / Treatments   EKG  I have reviewed and interpreted the EKG.  Appears to show normal sinus rhythm at 99 bpm with a Widened QRS  widened QRS, normal axis, largely normal intervals nonspecific ST changes.  Most consistent with left bundle branch block  RADIOLOGY  I have reviewed and interpreted chest x-ray images.  No obvious consolidation on my evaluation. Radiology has read the x-ray is negative   MEDICATIONS ORDERED IN ED: Medications  alum & mag hydroxide-simeth (MAALOX/MYLANTA) 200-200-20 MG/5ML suspension 30 mL (30 mLs Oral Given 01/26/23 0044)     IMPRESSION / MDM / ASSESSMENT AND PLAN / ED COURSE  I reviewed the triage vital signs and the nursing notes.  Patient's presentation is most consistent with acute presentation with potential threat to life or bodily function.  .  Most consistent with left bundle branch block.  Patient presents to the emergency department for abdominal pain and distention.  Has been ongoing for approximately a month intermittent but worse today so they came to the emergency department for evaluation.  Overall the patient appears well does have mild tenderness to the right mid right lower quadrant.  No fever no vomiting no diarrhea.  Denies any chest pain.  Patient's lab work has  resulted showing a mild leukocytosis 15,000, chemistry shows no significant finding, normal LFTs, normal lipase.  Negative/normal troponin.  Chest x-ray is clear and EKG shows no significant finding.  Given the patient's abdominal distention and mild tenderness to the right lower quadrant we will obtain CT imaging with contrast to further evaluate.  Patient agreeable to plan of care.  Patient currently resting comfortably, no distress, reassuring vital signs.  CT scan shows no significant abdominal finding besides constipation.  Patient does have a right middle lobe pneumonia.  Patient states he has been coughing more than normal.  Will cover with antibiotics as a precaution.  Patient will follow-up with his doctor in 4 weeks for repeat chest x-ray.  FINAL CLINICAL IMPRESSION(S) / ED DIAGNOSES   Abdominal  pain Community-acquired pneumonia   Note:  This document was prepared using Dragon voice recognition software and may include unintentional dictation errors.   Minna Antis, MD 01/26/23 [phone]

## 2023-01-27 ENCOUNTER — Telehealth: Payer: Self-pay

## 2023-01-27 ENCOUNTER — Inpatient Hospital Stay: Payer: MEDICAID

## 2023-01-27 VITALS — BP 116/75 | HR 94 | Temp 97.7°F

## 2023-01-27 DIAGNOSIS — D509 Iron deficiency anemia, unspecified: Secondary | ICD-10-CM | POA: Diagnosis not present

## 2023-01-27 MED ORDER — IRON SUCROSE 20 MG/ML IV SOLN
200.0000 mg | Freq: Once | INTRAVENOUS | Status: AC
  Administered 2023-01-27: 200 mg via INTRAVENOUS

## 2023-01-27 MED ORDER — SODIUM CHLORIDE 0.9% FLUSH
10.0000 mL | Freq: Two times a day (BID) | INTRAVENOUS | Status: DC
  Administered 2023-01-27: 10 mL via INTRAVENOUS
  Filled 2023-01-27: qty 10

## 2023-01-27 NOTE — Telephone Encounter (Signed)
Copied from CRM [id number]. Topic: General - Inquiry >> Jan 27, 2023 12:17 PM Shon Hale wrote: Reason for CRM: Pt scheduled to see Celso [facility] on 02/10/2022. Wife attempted to reach out to GI office to see if pt could be seen sooner as he just visited hospital. Pt's wife inquiring if office could reach out to GI office to see if pt could get sooner appointment.  Wife requesting an update, [phone]

## 2023-01-27 NOTE — Patient Instructions (Signed)
Iron Sucrose Injection What is this medication? IRON SUCROSE (EYE ern SOO krose) treats low levels of iron (iron deficiency anemia) in people with kidney disease. Iron is a mineral that plays an important role in making red blood cells, which carry oxygen from your lungs to the rest of your body. This medicine may be used for other purposes; ask your health care provider or pharmacist if you have questions. COMMON BRAND NAME(S): Venofer What should I tell my care team before I take this medication? They need to know if you have any of these conditions: Anemia not caused by low iron levels Heart disease High levels of iron in the blood Kidney disease Liver disease An unusual or allergic reaction to iron, other medications, foods, dyes, or preservatives Pregnant or trying to get pregnant Breastfeeding How should I use this medication? This medication is for infusion into a vein. It is given in a hospital or clinic setting. Talk to your care team about the use of this medication in children. While this medication may be prescribed for children as young as 2 years for selected conditions, precautions do apply. Overdosage: If you think you have taken too much of this medicine contact a poison control center or emergency room at once. NOTE: This medicine is only for you. Do not share this medicine with others. What if I miss a dose? Keep appointments for follow-up doses. It is important not to miss your dose. Call your care team if you are unable to keep an appointment. What may interact with this medication? Do not take this medication with any of the following: Deferoxamine Dimercaprol Other iron products This medication may also interact with the following: Chloramphenicol Deferasirox This list may not describe all possible interactions. Give your health care provider a list of all the medicines, herbs, non-prescription drugs, or dietary supplements you use. Also tell them if you smoke,  drink alcohol, or use illegal drugs. Some items may interact with your medicine. What should I watch for while using this medication? Visit your care team regularly. Tell your care team if your symptoms do not start to get better or if they get worse. You may need blood work done while you are taking this medication. You may need to follow a special diet. Talk to your care team. Foods that contain iron include: whole grains/cereals, dried fruits, beans, or peas, leafy green vegetables, and organ meats (liver, kidney). What side effects may I notice from receiving this medication? Side effects that you should report to your care team as soon as possible: Allergic reactions--skin rash, itching, hives, swelling of the face, lips, tongue, or throat Low blood pressure--dizziness, feeling faint or lightheaded, blurry vision Shortness of breath Side effects that usually do not require medical attention (report to your care team if they continue or are bothersome): Flushing Headache Joint pain Muscle pain Nausea Pain, redness, or irritation at injection site This list may not describe all possible side effects. Call your doctor for medical advice about side effects. You may report side effects to FDA at 1-800-FDA-1088. Where should I keep my medication? This medication is given in a hospital or clinic. It will not be stored at home. NOTE: This sheet is a summary. It may not cover all possible information. If you have questions about this medicine, talk to your doctor, pharmacist, or health care provider.  2024 Elsevier/Gold Standard (2022-06-21 00:00:00)

## 2023-01-28 ENCOUNTER — Telehealth: Payer: Self-pay | Admitting: Gastroenterology

## 2023-01-28 ENCOUNTER — Ambulatory Visit: Payer: MEDICAID

## 2023-01-28 LAB — SURGICAL PATHOLOGY

## 2023-01-28 NOTE — Telephone Encounter (Signed)
Sarah called from the PCP because patient was seen at the ED for constipation and need an urgent appointment.

## 2023-01-28 NOTE — Telephone Encounter (Signed)
I spoke to [place] who states she will send message to [facility] nurse Velna Hatchet to see if pt can be worked in sooner than the 14th. Pt has also been put on cancellation list Their office will contact pt if they get something sooner

## 2023-01-31 NOTE — Telephone Encounter (Signed)
 I called the patient and I spoke with his wife Boyd Kerbs) she said let her call him and she will call him right back.

## 2023-01-31 NOTE — Telephone Encounter (Addendum)
 Checking for an early appointment. The patient can come at 3:30 pm on 02/03/23. I called Sarah from the patient PCP office.

## 2023-01-31 NOTE — Telephone Encounter (Signed)
 The wife did call back to schedule her husband for the early appointment.

## 2023-02-03 ENCOUNTER — Inpatient Hospital Stay: Payer: MEDICAID | Attending: Internal Medicine

## 2023-02-03 VITALS — BP 119/77 | HR 77 | Temp 96.3°F | Resp 16

## 2023-02-03 DIAGNOSIS — D509 Iron deficiency anemia, unspecified: Secondary | ICD-10-CM | POA: Insufficient documentation

## 2023-02-03 MED ORDER — IRON SUCROSE 20 MG/ML IV SOLN
200.0000 mg | Freq: Once | INTRAVENOUS | Status: AC
Start: 2023-02-03 — End: 2023-02-03
  Administered 2023-02-03: 200 mg via INTRAVENOUS
  Filled 2023-02-03: qty 10

## 2023-02-03 MED ORDER — SODIUM CHLORIDE 0.9% FLUSH
10.0000 mL | Freq: Once | INTRAVENOUS | Status: AC | PRN
Start: 2023-02-03 — End: 2023-02-03
  Administered 2023-02-03: 10 mL
  Filled 2023-02-03: qty 10

## 2023-02-03 NOTE — Patient Instructions (Signed)
 Iron Sucrose Injection What is this medication? IRON SUCROSE (EYE ern SOO krose) treats low levels of iron (iron deficiency anemia) in people with kidney disease. Iron is a mineral that plays an important role in making red blood cells, which carry oxygen from your lungs to the rest of your body. This medicine may be used for other purposes; ask your health care provider or pharmacist if you have questions. COMMON BRAND NAME(S): Venofer What should I tell my care team before I take this medication? They need to know if you have any of these conditions: Anemia not caused by low iron levels Heart disease High levels of iron in the blood Kidney disease Liver disease An unusual or allergic reaction to iron, other medications, foods, dyes, or preservatives Pregnant or trying to get pregnant Breastfeeding How should I use this medication? This medication is for infusion into a vein. It is given in a hospital or clinic setting. Talk to your care team about the use of this medication in children. While this medication may be prescribed for children as young as 2 years for selected conditions, precautions do apply. Overdosage: If you think you have taken too much of this medicine contact a poison control center or emergency room at once. NOTE: This medicine is only for you. Do not share this medicine with others. What if I miss a dose? Keep appointments for follow-up doses. It is important not to miss your dose. Call your care team if you are unable to keep an appointment. What may interact with this medication? Do not take this medication with any of the following: Deferoxamine Dimercaprol Other iron products This medication may also interact with the following: Chloramphenicol Deferasirox This list may not describe all possible interactions. Give your health care provider a list of all the medicines, herbs, non-prescription drugs, or dietary supplements you use. Also tell them if you smoke,  drink alcohol, or use illegal drugs. Some items may interact with your medicine. What should I watch for while using this medication? Visit your care team regularly. Tell your care team if your symptoms do not start to get better or if they get worse. You may need blood work done while you are taking this medication. You may need to follow a special diet. Talk to your care team. Foods that contain iron include: whole grains/cereals, dried fruits, beans, or peas, leafy green vegetables, and organ meats (liver, kidney). What side effects may I notice from receiving this medication? Side effects that you should report to your care team as soon as possible: Allergic reactions--skin rash, itching, hives, swelling of the face, lips, tongue, or throat Low blood pressure--dizziness, feeling faint or lightheaded, blurry vision Shortness of breath Side effects that usually do not require medical attention (report to your care team if they continue or are bothersome): Flushing Headache Joint pain Muscle pain Nausea Pain, redness, or irritation at injection site This list may not describe all possible side effects. Call your doctor for medical advice about side effects. You may report side effects to FDA at 1-800-FDA-1088. Where should I keep my medication? This medication is given in a hospital or clinic. It will not be stored at home. NOTE: This sheet is a summary. It may not cover all possible information. If you have questions about this medicine, talk to your doctor, pharmacist, or health care provider.  2024 Elsevier/Gold Standard (2022-06-21 00:00:00)

## 2023-02-03 NOTE — Progress Notes (Signed)
 Declined 30 minute post-observation. Aware of risks. Vitals stable.

## 2023-02-04 ENCOUNTER — Ambulatory Visit (INDEPENDENT_AMBULATORY_CARE_PROVIDER_SITE_OTHER): Payer: MEDICAID | Admitting: Family Medicine

## 2023-02-04 ENCOUNTER — Encounter: Payer: Self-pay | Admitting: Family Medicine

## 2023-02-04 VITALS — BP 120/87 | HR 88 | Ht 71.0 in | Wt 181.3 lb

## 2023-02-04 DIAGNOSIS — D509 Iron deficiency anemia, unspecified: Secondary | ICD-10-CM

## 2023-02-04 DIAGNOSIS — K5900 Constipation, unspecified: Secondary | ICD-10-CM

## 2023-02-04 DIAGNOSIS — J189 Pneumonia, unspecified organism: Secondary | ICD-10-CM | POA: Insufficient documentation

## 2023-02-04 NOTE — Assessment & Plan Note (Signed)
 Recent acute constipation seen at [facility] on 01/26/23 Much improved, Bowel movements every other day, soft - Continue increasing daily fiber - Daily water intake recommend minimally 80oz - Continued daily stool softener - docusate sodium  - prune juice - May use miralax  as needed -d/c if diarrhea appears - Dulcolax as needed - stimulating, try softener first - Increase fruits and veggies - External abdominal wall massages to help stimulate large colon.

## 2023-02-04 NOTE — Progress Notes (Signed)
 Ellouise Console, PA-C [address]  [address]  [place], [place] [address]  Main: [phone]  Fax: [phone]   Primary Care Physician: [name] LABOR, FNP  Primary Gastroenterologist:  Ellouise Console, PA-C / Dr. Rogelia Copping    CC: Iron  deficiency anemia, abdominal pain, and constipation.  HPI: Richard Vasquez is a 63 y.o. male, established patient of Dr. Copping, presents for iron  deficiency anemia, abdominal pain, and constipation.  Patient went to [facility] ED 01/26/2023 to evaluate abdominal pain.  CT showed moderate constipation and right middle lobe pneumonia.  He was started on azithromycin  and cephalexin  antibiotics with benefit.  Cough has greatly improved / resolved.  He is scheduled for followup Chest Xray 02/23/22.  No vomiting or diarrhea.  Current cigarette smoker.    He is currently taking OTC MiraLAX , Dulcolax as needed, fiber bars.  On this treatment he is having soft bowel movement.  If he does not take medicine, then he has very hard stools.  He has noticed black stools for the past 6 months.  Admits to taking ibuprofen  and Goody powders in the past 6 months.  Recently stopped all NSAIDs.  Only takes Tylenol  now.  Is on pantoprazole  40 Mg daily.  Denies bright red rectal bleeding.  01/25/2023 labs: Hemoglobin 10.6, hematocrit 30.5, MCV 88.4, elevated white count 15.7.  Normal lipase and LFTs.  Glucose 177.  Negative troponin.    In November iron  studies were low.  Baseline hemoglobin a year ago (01/2022) was 13.2.  Patient has been receiving IV iron  infusions through hematology Dr. Clista for the past few months.  Also takes oral iron .  01/26/23 Abd / Pelvic CT w/ CM: 1. Right middle lobe pneumonia or aspiration, partially visible. Follow-up chest CT recommended after treatment to ensure clearing. 2. Asymmetric bronchial thickening in the right lower lobe with scattered subsegmental bronchial plugging. 3. Trace pleural effusions. 4. Constipation and diverticulosis. 5. Aortic  and coronary artery atherosclerosis. 6. Mild hepatic steatosis. 7. Small right paraumbilical and small bilateral inguinal fat hernias.  04/2020 colonoscopy by Dr. Copping: 2 small (5 mm, 2 mm) benign mucosal/hyperplastic polyps removed.  Good prep.  Grade 2 internal hemorrhoids.  Diffuse mild mucosal changes throughout the entire colon.  Biopsies were negative for microscopic colitis or IBD.  10-year repeat.  Current Outpatient Medications  Medication Sig Dispense Refill   gabapentin  (NEURONTIN ) 300 MG capsule Take 1 capsule (300 mg total) by mouth 3 (three) times daily. 270 capsule 3   hydrOXYzine  (VISTARIL ) 25 MG capsule Take 25 mg by mouth 4 (four) times daily as needed for anxiety.     OLANZapine  (ZYPREXA ) 10 MG tablet Take 10mg  by mouth once daily (Patient taking differently: Take 10 mg by mouth at bedtime.) 30 tablet 0   pantoprazole  (PROTONIX ) 40 MG tablet Take 1 tablet (40 mg total) by mouth daily. 90 tablet 3   QUEtiapine  (SEROQUEL ) 200 MG tablet Take 200 mg by mouth at bedtime.     QUEtiapine  (SEROQUEL ) 50 MG tablet Take 50 mg by mouth daily.     rosuvastatin  (CRESTOR ) 40 MG tablet Take 1 tablet (40 mg total) by mouth daily. 90 tablet 3   sertraline  (ZOLOFT ) 25 MG tablet Take 25 mg by mouth daily.     SUBOXONE  12-3 MG FILM Place under the tongue daily.     No current facility-administered medications for this visit.    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[facility])    Sleep  apnea     Past Surgical History:  Procedure Laterality Date   COLONOSCOPY WITH PROPOFOL  N/A 05/18/2020   Procedure: COLONOSCOPY WITH PROPOFOL ;  Surgeon: Jinny Carmine, MD;  Location: [facility] ENDOSCOPY;  Service: Endoscopy;  Laterality: N/A;   FRACTURE SURGERY     HIP PINNING,CANNULATED Left 08/18/2018   Procedure: CANNULATED HIP PINNING, Right knee aspiration;  Surgeon: Marchia Drivers, MD;  Location: [facility] ORS;  Service: Orthopedics;  Laterality: Left;   JOINT REPLACEMENT     ORIF FEMUR FRACTURE Right 06/10/2018   Procedure: OPEN REDUCTION INTERNAL FIXATION (ORIF) DISTAL FEMUR FRACTURE;  Surgeon: Kendal Drivers SQUIBB, MD;  Location: [facility] OR;  Service: Orthopedics;  Laterality: Right;   TIBIA IM NAIL INSERTION Left 05/17/2021   Procedure: INTRAMEDULLARY NAILING OF LEFT TIBIA, STRESS EXAM OF PELVIS;  Surgeon: Kendal Drivers SQUIBB, MD;  Location: [facility] OR;  Service: Orthopedics;  Laterality: Left;    Review of Systems:    All systems reviewed and negative except where noted in HPI.   Physical Examination:   BP 117/66   Pulse 79   Temp 97.9 F (36.6 C)   Ht 5' 10 (1.778 m)   Wt 179 lb 12.8 oz (81.6 kg)   BMI 25.80 kg/m   General: Well-nourished, well-developed in no acute distress.  Lungs: Clear to auscultation bilaterally. Non-labored. Heart: Regular rate and rhythm, no murmurs rubs or gallops.  Abdomen: Bowel sounds are normal; Abdomen is Soft; No hepatosplenomegaly, masses or hernias;  No Abdominal Tenderness; No guarding or rebound tenderness. Neuro: Alert and oriented x 3.  Grossly intact.  Psych: Alert and cooperative, normal mood and affect.   Imaging Studies: CT ABDOMEN PELVIS W CONTRAST Result Date: 01/26/2023 CLINICAL DATA:  Abdominal pain, acute, nonlocalized. Reports intermittent abdominal pain for a few weeks, worsening and more constant today. Recent history of back surgery to remove a lipoma 2 days ago. If this was done in our system it is not charted in Epic yet. There is abdominal  swelling and hypoxia. EXAM: CT ABDOMEN AND PELVIS WITH CONTRAST TECHNIQUE: Multidetector CT imaging of the abdomen and pelvis was performed using the standard protocol following bolus administration of intravenous contrast. RADIATION DOSE REDUCTION: This exam was performed according to the departmental dose-optimization program which includes automated exposure control, adjustment of the mA and/or kV according to patient size and/or use of iterative reconstruction technique. CONTRAST:  OMNIPAQUE  IOHEXOL  300 MG/ML  SOLN COMPARISON:  AP and lateral chest series yesterday, CT of abdomen and pelvis with IV contrast 01/15/2022 and 05/18/2022 FINDINGS: Lower chest: There is airspace consolidation with surrounding ground-glass disease in the medial segment of the right middle lobe. Patchy airspace disease was noted on the chest x-ray. Findings consistent with pneumonia or aspiration. Follow-up chest CT is recommended after treatment to ensure clearing. There is asymmetric bronchial thickening in right lower lobe and scattered subsegmental bronchial plugging. Lung bases are otherwise clear of infiltrates. Small hiatal hernia. Trace pleural effusions.  The cardiac size is normal. There are three-vessel coronary artery calcifications. Hepatobiliary: 19 cm in length liver with mild steatosis. There is loss of fine detail due to respiratory motion. There is no obvious mass enhancement. There are no calcified gallstones or gallbladder wall thickening. The common bile duct does measure prominent at 10 mm but is now more prominent than previously, with no visible filling defect. Pancreas: Partially atrophic and otherwise unremarkable. Spleen: No abnormality is seen through the breathing motion. Adrenals/Urinary Tract: No adrenal or renal mass is seen through the breathing motion. There are symmetric contrast excretion on the delayed phase. There is no urinary stone or obstruction. The bladder is unremarkable. Stomach/Bowel:  No dilatation or wall thickening. Moderate to severe fecal stasis especially ascending colon. Colonic diverticula noted without diverticulitis. An appendix is not seen in this patient. Vascular/Lymphatic: Aortic atherosclerosis. No enlarged abdominal or pelvic lymph nodes. Reproductive: Prostate is unremarkable. Both testicles are in the scrotal sac. Other: Small right paraumbilical fat hernia. Small inguinal fat hernias. No incarcerated hernia. No free hemorrhage, free fluid, free air or focal inflammatory process. Musculoskeletal: 3 threaded hip pins traverse the left femoral neck terminating in the femoral head. There are healed bilateral pubic rami fractures. Degenerative changes and mild levoscoliosis lumbar spine. Most advanced degenerative change at L1-2 and L2-3. Mild osteopenia. IMPRESSION: 1. Right middle lobe pneumonia or aspiration, partially visible. Follow-up chest CT recommended after treatment to ensure clearing. 2. Asymmetric bronchial thickening in the right lower lobe with scattered subsegmental bronchial plugging. 3. Trace pleural effusions. 4. Constipation and diverticulosis. 5. Aortic and coronary artery atherosclerosis. 6. Mild hepatic steatosis. 7. Small right paraumbilical and small bilateral inguinal fat hernias. 8. Osteopenia and degenerative change. Aortic Atherosclerosis (ICD10-I70.0). Electronically Signed   By: Francis Quam M.D.   On: 01/26/2023 03:25   DG Chest 2 View Result Date: 01/25/2023 CLINICAL DATA:  Shortness of breath EXAM: CHEST - 2 VIEW COMPARISON:  02/10/2022 FINDINGS: Cardiac shadow is within normal limits. The lungs are hyperinflated bilaterally. Mild scarring is noted in the right apex. No focal infiltrate or effusion is seen. IMPRESSION: No active cardiopulmonary disease. Electronically Signed   By: Oneil Devonshire M.D.   On: 01/25/2023 21:56   US  CHEST SOFT TISSUE Result Date: 01/14/2023 : PROCEDURE: US  SOFT TISSUE HISTORY: Patient is a 63 y/o M with posterior  right shoulder soft tissue mass. COMPARISON: . TECHNIQUE: Two-dimensional grayscale and color Doppler ultrasound of the posterior right shoulder region of interest was performed. FINDINGS: There is a 3.5 x 1.1 x 3.1 cm ovoid, hyperechoic and avascular lesion identified within the posterior right shoulder, corresponding to the area of concern. No focal fluid collections are demonstrated. IMPRESSION: 1. 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 Thank you for allowing us  to assist in the care of this patient. Electronically Signed   By: Lynwood Mains M.D.   On: 01/14/2023 08:06    Assessment and Plan:   MAITLAND LESIAK is a 63 y.o. y/o male presents for:  1.  Iron  deficiency anemia - Suspect Upper GI bleed from NSAIS (Ibuprofen , Goody powders).  Schedule EGD after Pneumonia Resolves  2.   Black Stools x 6 months - Evaluate for Peptic Ulcer  He recently stopped Ibuprofen  and BC powders.  Avoid All NSAIDS.    Continue PPI Pantoprazole  40mg  daily.  Scheduling EGD I discussed risks of EGD with patient to include risk of bleeding, perforation, and risk of sedation.  Patient expressed understanding  and agrees to proceed with EGD.   3.  Chronic constipation  Gave samples of Linzess 72 mcg QD for 1 week, then 145 mcg QD for 1 week, then 290 mcg QD for 1 week.  He will let me know which dose works best, and then we can give a prescription.   4.  Pneumonia, right middle lobe - Recently treated with antibiotics.  Follow-up with PCP to ensure resolution prior to scheduling any GI procedures.  He is scheduled for f/u Chest Xray 02/23/22.   Ellouise Console, PA-C  Follow up 4 weeks after EGD.

## 2023-02-04 NOTE — Assessment & Plan Note (Signed)
 Pt receiving weekly iron infusion through hem/onc management Concerns GI source/related, plans for GI appt on 02/05/23 for w/u on need for EDG to find potential source and assess hernia. Appreciate evaluation and recommendations.

## 2023-02-04 NOTE — Assessment & Plan Note (Signed)
 Recently seen at [facility] for constipation,  Right middle pneumonia found during work up. Finished course of prescribed abx, Keflex  500mg  BID for 7 days.  Clear, diminished lungs today Tobacco cessation encouraged Repeat chest xray in 3 weeks per ED discharge - ordered. Pt may go week of 02/24/23

## 2023-02-04 NOTE — Progress Notes (Signed)
 Established Patient Office Visit  Introduced to nurse practitioner role and practice setting.  All questions answered.  Discussed provider/patient relationship and expectations.   Subjective   Patient ID: Richard Vasquez, male    DOB: 1960/05/22  Age: 63 y.o. MRN: [id number]  Chief Complaint  Patient presents with   Hospitalization Follow-up    Patient seen at [facility] on 01/26/23 for constipation. Pt has completed abx and reports feeling goof. He states that thinhs are improved but still has some room for improvement. GI appt tomorrow    Pt here with wife, Richard Vasquez, for f/u post Ed visit on 01/26/23 for constipation and pneumonia. Constipation better and no longer is coughing. Pt states he feels much better since ABX course, which he completed.   Constipation - bowel movements every other day, denies painful other than some tenderness near known hernia. He is taking dulcolax and eating fiber bars. Drinks daily V8 recommended for iron  infusions. He has Gi appt on 02/04/22 for work up for EDG and hernia.        12/30/2022    2:07 PM 12/24/2022    1:58 PM 05/22/2022    2:22 PM  Depression screen PHQ 2/9  Decreased Interest 0 0 0  Down, Depressed, Hopeless 0 0 0  PHQ - 2 Score 0 0 0  Altered sleeping  0 0  Tired, decreased energy  0 0  Change in appetite  0 0  Feeling bad or failure about yourself   0 0  Trouble concentrating  0 0  Moving slowly or fidgety/restless  0 0  Suicidal thoughts  0 0  PHQ-9 Score  0 0  Difficult doing work/chores  Not difficult at all Not difficult at all       05/22/2022    2:22 PM  GAD 7 : Generalized Anxiety Score  Nervous, Anxious, on Edge 0  Control/stop worrying 0  Worry too much - different things 0  Trouble relaxing 0  Restless 0  Easily annoyed or irritable 0  Afraid - awful might happen 0  Total GAD 7 Score 0     Review of Systems  All other systems reviewed and are negative.   Negative unless indicated in HPI   Objective:     BP  120/87 (BP Location: Left Arm, Patient Position: Sitting, Cuff Size: Normal)   Pulse 88   Ht 5' 11 (1.803 m)   Wt 181 lb 4.8 oz (82.2 kg)   SpO2 95%   BMI 25.29 kg/m    Physical Exam Constitutional:      General: He is not in acute distress.    Appearance: Normal appearance. He is normal weight. He is not ill-appearing, toxic-appearing or diaphoretic.  HENT:     Head: Normocephalic.     Mouth/Throat:     Mouth: Mucous membranes are moist.  Eyes:     Extraocular Movements: Extraocular movements intact.     Conjunctiva/sclera: Conjunctivae normal.     Pupils: Pupils are equal, round, and reactive to light.  Cardiovascular:     Rate and Rhythm: Normal rate and regular rhythm.     Pulses: Normal pulses.     Heart sounds: Normal heart sounds. No murmur heard.    No friction rub. No gallop.  Pulmonary:     Effort: No respiratory distress.     Breath sounds: No stridor. No wheezing, rhonchi or rales.     Comments: diminished Chest:     Chest wall: No tenderness.  Abdominal:  General: Abdomen is flat. Bowel sounds are normal.     Palpations: Abdomen is soft. There is no mass.     Tenderness: There is abdominal tenderness. There is no guarding or rebound.     Hernia: A hernia is present.     Comments: Tenderness near hernia and mild LLQ - pt feels related to constipation  Musculoskeletal:     Cervical back: No tenderness.  Lymphadenopathy:     Cervical: No cervical adenopathy.  Skin:    General: Skin is warm and dry.     Capillary Refill: Capillary refill takes less than 2 seconds.  Neurological:     General: No focal deficit present.     Mental Status: He is alert and oriented to person, place, and time. Mental status is at baseline.     No results found for any visits on 02/04/23.    The 10-year ASCVD risk score (Arnett DK, et al., 2019) is: 14.8%    Assessment & Plan:  Constipation, unspecified constipation type Assessment & Plan: Recent acute constipation  seen at [facility] on 01/26/23 Much improved, Bowel movements every other day, soft - Continue increasing daily fiber - Daily water intake recommend minimally 80oz - Continued daily stool softener - docusate sodium  - prune juice - May use miralax  as needed -d/c if diarrhea appears - Dulcolax as needed - stimulating, try softener first - Increase fruits and veggies - External abdominal wall massages to help stimulate large colon.    Pneumonia of right middle lobe due to infectious organism Assessment & Plan: Recently seen at [facility] for constipation,  Right middle pneumonia found during work up. Finished course of prescribed abx, Keflex  500mg  BID for 7 days.  Clear, diminished lungs today Tobacco cessation encouraged Repeat chest xray in 3 weeks per ED discharge - ordered. Pt may go week of 02/24/23  Orders: -     DG Chest 2 View; Future  Iron  deficiency anemia, unspecified iron  deficiency anemia type Assessment & Plan: Pt receiving weekly iron  infusion through hem/onc management Concerns GI source/related, plans for GI appt on 02/05/23 for w/u on need for EDG to find potential source and assess hernia. Appreciate evaluation and recommendations.     Return for 03/27/23 for planned appt. SABRA LILLETTE Curtis DELENA Wellington, FNP, have reviewed all documentation for this visit. The documentation on 02/04/23 for the exam, diagnosis, procedures, and orders are all accurate and complete.   Curtis DELENA Wellington, FNP

## 2023-02-05 ENCOUNTER — Encounter: Payer: Self-pay | Admitting: Physician Assistant

## 2023-02-05 ENCOUNTER — Ambulatory Visit: Payer: MEDICAID | Admitting: Physician Assistant

## 2023-02-05 VITALS — BP 117/66 | HR 79 | Temp 97.9°F | Ht 70.0 in | Wt 179.8 lb

## 2023-02-05 DIAGNOSIS — D509 Iron deficiency anemia, unspecified: Secondary | ICD-10-CM | POA: Diagnosis not present

## 2023-02-05 DIAGNOSIS — D649 Anemia, unspecified: Secondary | ICD-10-CM

## 2023-02-05 DIAGNOSIS — K5909 Other constipation: Secondary | ICD-10-CM

## 2023-02-05 DIAGNOSIS — K921 Melena: Secondary | ICD-10-CM

## 2023-02-05 DIAGNOSIS — K5904 Chronic idiopathic constipation: Secondary | ICD-10-CM

## 2023-02-05 NOTE — Patient Instructions (Signed)
 For Constipation Try: Samples of Linzess 72 mcg QD for 1 week,  then 145 mcg QD for 1 week,  then 290 mcg QD for 1 week.   Please let me know which dose works best, and then we can call in a prescription.   Drink 64 ounces of water/fluids daily.   Eat high-fiber diet with fruits, vegetables, whole wheat breads and cereals.  Please avoid all NSAID such as ibuprofen , Advil , Aleve, Goody powders, and BC powders which can cause bleeding ulcers.  Continue pantoprazole  40 Mg once daily.

## 2023-02-06 ENCOUNTER — Ambulatory Visit (INDEPENDENT_AMBULATORY_CARE_PROVIDER_SITE_OTHER): Payer: MEDICAID | Admitting: General Surgery

## 2023-02-06 ENCOUNTER — Encounter: Payer: Self-pay | Admitting: General Surgery

## 2023-02-06 VITALS — BP 119/71 | HR 86 | Temp 98.5°F | Ht 70.0 in | Wt 178.2 lb

## 2023-02-06 DIAGNOSIS — Z09 Encounter for follow-up examination after completed treatment for conditions other than malignant neoplasm: Secondary | ICD-10-CM

## 2023-02-06 DIAGNOSIS — D171 Benign lipomatous neoplasm of skin and subcutaneous tissue of trunk: Secondary | ICD-10-CM

## 2023-02-06 NOTE — Progress Notes (Signed)
 Outpatient Surgical Follow Up  02/06/2023  Richard Vasquez is an 63 y.o. male.   Chief Complaint  Patient presents with   Follow-up    Excision lipoma 01/23/23    HPI: Patient returns today status post excision of right back lipoma.  He reports doing well.  He said there was some swelling over the incision but that is getting better.  He denies any drainage or pain at the incision.  He denies any redness.  He has had no fevers or chills.  Past Medical History:  Diagnosis Date   Anxiety    Arthritis    BPH (benign prostatic hyperplasia)    C2 cervical fracture (HCC) 06/12/2018   Chronic pain    Closed displaced supracondylar fracture of distal end of right femur with intracondylar extension (HCC) 06/10/2018   Closed fracture of distal end of right femur (HCC) 06/09/2018   Closed left hip fracture, initial encounter (HCC) 08/18/2018   Depression    Hepatitis C    Paranoid schizophrenia (HCC)    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[facility]    Sleep apnea     Past Surgical History:  Procedure Laterality Date   COLONOSCOPY WITH PROPOFOL  N/A 05/18/2020   Procedure: COLONOSCOPY WITH PROPOFOL ;  Surgeon: Jinny Carmine, MD;  Location: [facility] ENDOSCOPY;  Service: Endoscopy;  Laterality: N/A;   FRACTURE SURGERY     HIP PINNING,CANNULATED Left 08/18/2018   Procedure: CANNULATED HIP PINNING, Right knee aspiration;  Surgeon: Marchia Drivers, MD;  Location: [facility] ORS;  Service: Orthopedics;  Laterality: Left;   JOINT REPLACEMENT     ORIF FEMUR FRACTURE Right 06/10/2018   Procedure: OPEN REDUCTION INTERNAL FIXATION (ORIF) DISTAL FEMUR FRACTURE;  Surgeon: Kendal Drivers SQUIBB, MD;  Location: [facility] OR;  Service: Orthopedics;  Laterality: Right;   TIBIA IM NAIL INSERTION Left 05/17/2021   Procedure: INTRAMEDULLARY NAILING OF LEFT TIBIA, STRESS EXAM OF PELVIS;  Surgeon: Kendal Drivers SQUIBB, MD;  Location: [facility] OR;  Service: Orthopedics;  Laterality: Left;    Family History  Problem Relation Age of Onset   Cancer Mother     Diabetes Mother    Diabetes Paternal Aunt    Lung cancer Paternal Uncle     Social History:  reports that he has been smoking cigarettes. He started smoking about 21 years ago. He has a 21.4 pack-year smoking history. He has been exposed to tobacco smoke. He has never used smokeless tobacco. He reports that he does not currently use alcohol. He reports that he does not currently use drugs.  Allergies: No Known Allergies  Medications reviewed.    ROS Full ROS performed and is otherwise negative other than what is stated in HPI   BP 119/71   Pulse 86   Temp 98.5 F (36.9 C) (Oral)   Ht 5' 10 (1.778 m)   Wt 178 lb 3.2 oz (80.8 kg)   SpO2 90%   BMI 25.57 kg/m   Physical Exam Right back the incision is healing well.  There is no surrounding erythema.  There is some swelling around where the lipoma was removed but no evidence of recurrence.   Pathology consistent with benign etiology, discussed with patient No results found for this or any previous visit (from the past 48 hours). No results found.  Assessment/Plan:  Patient is status post right back lipoma removal.  He is doing well.  No wound complications.  He can follow-up with our office on an as-needed basis   Jayson Endow, M.D. [facility]

## 2023-02-06 NOTE — Patient Instructions (Signed)
 Lipoma Removal, Care After The following information offers guidance on how to care for yourself after your procedure. Your health care provider may also give you more specific instructions. If you have problems or questions, contact your health care provider. What can I expect after the procedure? After the procedure, it is common to have: Mild pain. Swelling. Bruising. Follow these instructions at home: Bathing  Do not take baths, swim, or use a hot tub until your health care provider approves. Ask your health care provider if you may take showers. You may only be allowed to take sponge baths. Keep your bandage (dressing) clean and dry until your health care provider says it can be removed. Incision care  Follow instructions from your health care provider about how to take care of your incision. Make sure you: Wash your hands with soap and water for at least 20 seconds before and after you change your dressing. If soap and water are not available, use hand sanitizer. Change your dressing as told by your health care provider. Leave stitches (sutures), skin glue, or adhesive strips in place. These skin closures may need to stay in place for 2 weeks or longer. If adhesive strip edges start to loosen and curl up, you may trim the loose edges. Do not remove adhesive strips completely unless your health care provider tells you to do that. Check your incision area every day for signs of infection. Check for: More redness, swelling, or pain. Fluid or blood. Warmth. Pus or a bad smell. Medicines Take over-the-counter and prescription medicines only as told by your health care provider. If you were prescribed an antibiotic medicine, use it as told by your health care provider. Do not stop using the antibiotic even if you start to feel better. General instructions  If you were given a sedative during the procedure, it can affect you for several hours. Do not drive or operate machinery until your  health care provider says that it is safe. Do not use any products that contain nicotine or tobacco before the procedure. These products include cigarettes, chewing tobacco, and vaping devices, such as e-cigarettes. These can delay healing after surgery. If you need help quitting, ask your health care provider. Return to your normal activities as told by your health care provider. Ask your health care provider what activities are safe for you. Keep all follow-up visits. This is important. Contact a health care provider if: You have more redness, swelling, or pain around your incision. You have fluid or blood coming from your incision. Your incision feels warm to the touch. You have pus or a bad smell coming from your incision. You have pain that does not get better with medicine. Get help right away if: You have chills or a fever. You have severe pain. Summary After the procedure, it is common to have mild pain, swelling, and bruising. Follow instructions from your health care provider about how to take care of your incision. Contact a health care provider if you have signs of infection such as more redness, swelling, or pain. This information is not intended to replace advice given to you by your health care provider. Make sure you discuss any questions you have with your health care provider. Document Revised: 02/02/2021 Document Reviewed: 02/02/2021 Elsevier Patient Education  2024 [employer].

## 2023-02-10 ENCOUNTER — Inpatient Hospital Stay: Payer: MEDICAID

## 2023-02-10 VITALS — BP 121/79 | HR 80 | Temp 96.4°F

## 2023-02-10 DIAGNOSIS — D509 Iron deficiency anemia, unspecified: Secondary | ICD-10-CM | POA: Diagnosis not present

## 2023-02-10 MED ORDER — IRON SUCROSE 20 MG/ML IV SOLN
200.0000 mg | Freq: Once | INTRAVENOUS | Status: AC
  Administered 2023-02-10: 200 mg via INTRAVENOUS

## 2023-02-10 MED ORDER — SODIUM CHLORIDE 0.9% FLUSH
10.0000 mL | Freq: Once | INTRAVENOUS | Status: AC | PRN
  Administered 2023-02-10: 10 mL
  Filled 2023-02-10: qty 10

## 2023-02-11 ENCOUNTER — Ambulatory Visit: Payer: MEDICAID | Admitting: Physician Assistant

## 2023-02-24 ENCOUNTER — Ambulatory Visit
Admission: RE | Admit: 2023-02-24 | Discharge: 2023-02-24 | Disposition: A | Discharge status: Home / Self Care | Payer: MEDICAID | Source: Ambulatory Visit | Attending: Family Medicine | Admitting: Family Medicine

## 2023-02-24 ENCOUNTER — Ambulatory Visit
Admission: RE | Admit: 2023-02-24 | Discharge: 2023-02-24 | Disposition: A | Discharge status: Home / Self Care | Payer: MEDICAID | Attending: Family Medicine | Admitting: Family Medicine

## 2023-02-24 DIAGNOSIS — J189 Pneumonia, unspecified organism: Secondary | ICD-10-CM

## 2023-02-26 ENCOUNTER — Other Ambulatory Visit: Payer: Self-pay | Admitting: Family Medicine

## 2023-02-26 DIAGNOSIS — K2100 Gastro-esophageal reflux disease with esophagitis, without bleeding: Secondary | ICD-10-CM

## 2023-02-26 NOTE — Telephone Encounter (Signed)
Medication Refill -  Most Recent Primary Care Visit:  Provider: Sallee Provencal  Department: [facility]  Visit Type: HOSPITAL FU  Date: 02/04/2023  Medication: pantoprazole (PROTONIX) 40 MG tablet   Has the patient contacted their pharmacy? No   Is this the correct pharmacy for this prescription? Yes  This is the patient's preferred pharmacy:  [facility] [address], [place] - [address] [place] [address] Phone: [phone] Fax: [phone]   Has the prescription been filled recently? Yes  Is the patient out of the medication? Yes  Has the patient been seen for an appointment in the last year OR does the patient have an upcoming appointment? Yes  Can we respond through MyChart? Yes  Agent: Please be advised that Rx refills may take up to 3 business days. We ask that you follow-up with your pharmacy.

## 2023-02-27 MED ORDER — PANTOPRAZOLE SODIUM 40 MG PO TBEC: 40.0000 mg | DELAYED_RELEASE_TABLET | Freq: Every day | ORAL | 0 refills | Status: DC

## 2023-02-27 NOTE — Telephone Encounter (Signed)
Requested Prescriptions  Pending Prescriptions Disp Refills   pantoprazole (PROTONIX) 40 MG tablet 90 tablet 0    Sig: Take 1 tablet (40 mg total) by mouth daily.     Gastroenterology: Proton Pump Inhibitors Passed - 02/27/2023  3:12 PM      Passed - Valid encounter within last 12 months    Recent Outpatient Visits           3 weeks ago Constipation, unspecified constipation type   [facility], Caryl Asp A, FNP   2 months ago Traumatic brain injury, without loss of consciousness, sequela [facility])   [facility] Merita Norton T, FNP   2 years ago Chronic active hepatitis [facility])   [facility], Eula Fried, FNP   2 years ago Frequent urination   [facility] Fl[facility], Eula Fried, FNP       Future Appointments             In 4 weeks Sallee Provencal, FNP [facility]   In 1 month Elie Goody, MD [facility]

## 2023-03-03 ENCOUNTER — Encounter: Payer: Self-pay | Admitting: Gastroenterology

## 2023-03-03 ENCOUNTER — Encounter: Payer: Self-pay | Admitting: Family Medicine

## 2023-03-04 ENCOUNTER — Ambulatory Visit
Admission: RE | Admit: 2023-03-04 | Discharge: 2023-03-04 | Disposition: A | Discharge status: Home / Self Care | Payer: MEDICAID | Attending: Gastroenterology | Admitting: Gastroenterology

## 2023-03-04 ENCOUNTER — Ambulatory Visit: Payer: MEDICAID

## 2023-03-04 ENCOUNTER — Other Ambulatory Visit: Payer: Self-pay

## 2023-03-04 ENCOUNTER — Encounter
Admission: RE | Disposition: A | Discharge status: Home / Self Care | Payer: Self-pay | Source: Home / Self Care | Attending: Gastroenterology

## 2023-03-04 ENCOUNTER — Encounter: Payer: Self-pay | Admitting: Gastroenterology

## 2023-03-04 DIAGNOSIS — K449 Diaphragmatic hernia without obstruction or gangrene: Secondary | ICD-10-CM | POA: Insufficient documentation

## 2023-03-04 DIAGNOSIS — R1013 Epigastric pain: Secondary | ICD-10-CM | POA: Diagnosis present

## 2023-03-04 DIAGNOSIS — K222 Esophageal obstruction: Secondary | ICD-10-CM | POA: Diagnosis not present

## 2023-03-04 DIAGNOSIS — F419 Anxiety disorder, unspecified: Secondary | ICD-10-CM | POA: Insufficient documentation

## 2023-03-04 DIAGNOSIS — G4730 Sleep apnea, unspecified: Secondary | ICD-10-CM | POA: Insufficient documentation

## 2023-03-04 DIAGNOSIS — K2950 Unspecified chronic gastritis without bleeding: Secondary | ICD-10-CM

## 2023-03-04 DIAGNOSIS — K2970 Gastritis, unspecified, without bleeding: Secondary | ICD-10-CM | POA: Diagnosis not present

## 2023-03-04 DIAGNOSIS — D649 Anemia, unspecified: Secondary | ICD-10-CM

## 2023-03-04 DIAGNOSIS — I10 Essential (primary) hypertension: Secondary | ICD-10-CM | POA: Diagnosis not present

## 2023-03-04 DIAGNOSIS — K219 Gastro-esophageal reflux disease without esophagitis: Secondary | ICD-10-CM | POA: Insufficient documentation

## 2023-03-04 DIAGNOSIS — F17210 Nicotine dependence, cigarettes, uncomplicated: Secondary | ICD-10-CM | POA: Diagnosis not present

## 2023-03-04 DIAGNOSIS — F32A Depression, unspecified: Secondary | ICD-10-CM | POA: Insufficient documentation

## 2023-03-04 HISTORY — PX: BALLOON DILATION: SHX5330

## 2023-03-04 HISTORY — PX: ESOPHAGOGASTRODUODENOSCOPY (EGD) WITH PROPOFOL: SHX5813

## 2023-03-04 HISTORY — PX: BIOPSY: SHX5522

## 2023-03-04 SURGERY — ESOPHAGOGASTRODUODENOSCOPY (EGD) WITH PROPOFOL
Anesthesia: General

## 2023-03-04 MED ORDER — SODIUM CHLORIDE 0.9 % IV SOLN: INTRAVENOUS | Status: DC

## 2023-03-04 MED ORDER — LIDOCAINE HCL (PF) 2 % IJ SOLN
INTRAMUSCULAR | Status: DC | PRN
  Administered 2023-03-04: 100 mg via INTRADERMAL

## 2023-03-04 MED ORDER — PROPOFOL 500 MG/50ML IV EMUL
INTRAVENOUS | Status: DC | PRN
  Administered 2023-03-04: 200 ug/kg/min via INTRAVENOUS
  Administered 2023-03-04: 140 mg via INTRAVENOUS

## 2023-03-04 NOTE — Op Note (Addendum)
 [facility] Gastroenterology Patient Name: Richard Vasquez Procedure Date: 03/04/2023 8:48 AM MRN: [id number] Account #: [id number] Date of Birth: 25-Dec-1960 Admit Type: Outpatient Age: 63 Room: [facility] ENDO ROOM 4 Gender: Male Note Status: Finalized Instrument Name: Upper Endoscope [id number] Procedure:             Upper GI endoscopy Indications:           Dyspepsia Providers:             Rogelia Copping MD, MD Referring MD:          No Local Md, MD (Referring MD) Medicines:             Propofol  per Anesthesia Complications:         No immediate complications. Procedure:             Pre-Anesthesia Assessment:                        - Prior to the procedure, a History and Physical was                         performed, and patient medications and allergies were                         reviewed. The patient's tolerance of previous                         anesthesia was also reviewed. The risks and benefits                         of the procedure and the sedation options and risks                         were discussed with the patient. All questions were                         answered, and informed consent was obtained. Prior                         Anticoagulants: The patient has taken no anticoagulant                         or antiplatelet agents. ASA Grade Assessment: II - A                         patient with mild systemic disease. After reviewing                         the risks and benefits, the patient was deemed in                         satisfactory condition to undergo the procedure.                        After obtaining informed consent, the endoscope was                         passed under direct vision. Throughout the procedure,  the patient's blood pressure, pulse, and oxygen                         saturations were monitored continuously. The                         Endosonoscope was introduced through the mouth, and                          advanced to the second part of duodenum. The upper GI                         endoscopy was accomplished without difficulty. The                         patient tolerated the procedure well. Findings:      A medium-sized hiatal hernia was present.      One benign-appearing, intrinsic mild stenosis was found at the       gastroesophageal junction. The stenosis was traversed. A TTS dilator was       passed through the scope. Dilation with a 15-16.5-18 mm balloon dilator       was performed to 18 mm.      Diffuse mild inflammation characterized by erythema was found in the       gastric antrum. Biopsies were taken with a cold forceps for histology.      The examined duodenum was normal. Impression:            - Medium-sized hiatal hernia.                        - Benign-appearing esophageal stenosis. Dilated.                        - Gastritis. Biopsied.                        - Normal examined duodenum. Recommendation:        - Discharge patient to home.                        - Resume previous diet.                        - Continue present medications.                        - Await pathology results.                        - Continue present medications. Procedure Code(s):     --- Professional ---                        [id number], Esophagogastroduodenoscopy, flexible,                         transoral; with transendoscopic balloon dilation of                         esophagus (less than 30 mm diameter)  56760, 59, Esophagogastroduodenoscopy, flexible,                         transoral; with biopsy, single or multiple Diagnosis Code(s):     --- Professional ---                        R10.13, Epigastric pain                        K29.70, Gastritis, unspecified, without bleeding                        K22.2, Esophageal obstruction CPT copyright 2022 American Medical Association. All rights reserved. The codes documented in this report are preliminary and  upon coder review may  be revised to meet current compliance requirements. Rogelia Copping MD, MD 03/04/2023 9:07:04 AM This report has been signed electronically. Number of Addenda: 0 Note Initiated On: 03/04/2023 8:48 AM Estimated Blood Loss:  Estimated blood loss: none.      [facility]

## 2023-03-04 NOTE — H&P (Signed)
 Rogelia Copping, MD [facility] [address] [place], [place] [address] [phone] Fax : [phone]  Primary Care Physician:  [name] LABOR, FNP Primary Gastroenterologist:  Dr. Copping  Pre-Procedure History & Physical: HPI:  Richard Vasquez is a 63 y.o. male is here for an endoscopy.   Past Medical History:  Diagnosis Date   Anxiety    Arthritis    BPH (benign prostatic hyperplasia)    C2 cervical fracture (HCC) 06/12/2018   Chronic pain    Closed displaced supracondylar fracture of distal end of right femur with intracondylar extension (HCC) 06/10/2018   Closed fracture of distal end of right femur (HCC) 06/09/2018   Closed left hip fracture, initial encounter (HCC) 08/18/2018   Depression    Hepatitis C    Paranoid schizophrenia (HCC)    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[facility])    Sleep apnea     Past Surgical History:  Procedure Laterality Date   COLONOSCOPY WITH PROPOFOL  N/A 05/18/2020   Procedure: COLONOSCOPY WITH PROPOFOL ;  Surgeon: Copping Rogelia, MD;  Location: [facility] ENDOSCOPY;  Service: Endoscopy;  Laterality: N/A;   FRACTURE SURGERY     HIP PINNING,CANNULATED Left 08/18/2018   Procedure: CANNULATED HIP PINNING, Right knee aspiration;  Surgeon: Marchia Drivers, MD;  Location: [facility] ORS;  Service: Orthopedics;  Laterality: Left;   JOINT REPLACEMENT     ORIF FEMUR FRACTURE Right 06/10/2018   Procedure: OPEN REDUCTION INTERNAL FIXATION (ORIF) DISTAL FEMUR FRACTURE;  Surgeon: Kendal Drivers SQUIBB, MD;  Location: [facility] OR;  Service: Orthopedics;  Laterality: Right;   TIBIA IM NAIL INSERTION Left 05/17/2021   Procedure: INTRAMEDULLARY NAILING OF LEFT TIBIA, STRESS EXAM OF PELVIS;  Surgeon: Kendal Drivers SQUIBB, MD;  Location: [facility] OR;  Service: Orthopedics;  Laterality: Left;    Prior to Admission medications   Medication Sig Start Date End Date Taking? Authorizing Provider  gabapentin  (NEURONTIN ) 300 MG capsule Take 1 capsule (300 mg total) by mouth 3 (three) times daily.  12/24/22  Yes Emilio Kelly DASEN, FNP  hydrOXYzine  (VISTARIL ) 25 MG capsule Take 25 mg by mouth 4 (four) times daily as needed for anxiety. 01/30/22  Yes [provider]  OLANZapine  (ZYPREXA ) 10 MG tablet Take 10mg  by mouth once daily Patient taking differently: Take 10 mg by mouth at bedtime. 06/15/21  Yes Sreenath, Sudheer B, MD  pantoprazole  (PROTONIX ) 40 MG tablet Take 1 tablet (40 mg total) by mouth daily. 02/27/23  Yes Clifton, Kellie A, FNP  QUEtiapine  (SEROQUEL ) 50 MG tablet Take 50 mg by mouth daily.   Yes [provider]  rosuvastatin  (CRESTOR ) 40 MG tablet Take 1 tablet (40 mg total) by mouth daily. 12/24/22  Yes Emilio Kelly T, FNP  sertraline  (ZOLOFT ) 25 MG tablet Take 25 mg by mouth daily.   Yes [provider]  SUBOXONE  12-3 MG FILM Place under the tongue daily.   Yes [provider]  QUEtiapine  (SEROQUEL ) 200 MG tablet Take 200 mg by mouth at bedtime.    [provider]    Allergies as of 02/05/2023   (No Known Allergies)    Family History  Problem Relation Age of Onset   Cancer Mother    Diabetes Mother    Diabetes Paternal Aunt    Lung cancer Paternal Uncle     Social History   Socioeconomic History   Marital status: Married    Spouse name: Not on file   Number of children: Not on file   Years of education: Not on file  Highest education level: 8th grade  Occupational History   Not on file  Tobacco Use   Smoking status: Every Day    Current packs/day: 0.50    Average packs/day: 1 pack/day for 22.0 years (21.5 ttl pk-yrs)    Types: Cigarettes    Start date: 02/28/2001    Passive exposure: Past   Smokeless tobacco: Never  Vaping Use   Vaping status: Never Used  Substance and Sexual Activity   Alcohol use: Not Currently   Drug use: Not Currently   Sexual activity: Not on file  Other Topics Concern   Not on file  Social History Narrative   ** Merged History Encounter **       Social Drivers of Health   Financial  Resource Strain: Low Risk  (11/04/2022)   Overall Financial Resource Strain (CARDIA)    Difficulty of Paying Living Expenses: Not hard at all  Food Insecurity: No Food Insecurity (12/30/2022)   Hunger Vital Sign    Worried About Running Out of Food in the Last Year: Never true    Ran Out of Food in the Last Year: Never true  Transportation Needs: No Transportation Needs (12/30/2022)   PRAPARE - Administrator, Civil Service (Medical): No    Lack of Transportation (Non-Medical): No  Physical Activity: Unknown (11/04/2022)   Exercise Vital Sign    Days of Exercise per Week: 0 days    Minutes of Exercise per Session: Not on file  Stress: No Stress Concern Present (11/04/2022)   [employer] of Occupational Health - Occupational Stress Questionnaire    Feeling of Stress : Not at all  Social Connections: Moderately Integrated (11/04/2022)   Social Connection and Isolation Panel [NHANES]    Frequency of Communication with Friends and Family: Three times a week    Frequency of Social Gatherings with Friends and Family: More than three times a week    Attends Religious Services: 1 to 4 times per year    Active Member of [employer] or Organizations: No    Attends Banker Meetings: Not on file    Marital Status: Living with partner  Intimate Partner Violence: Not At Risk (12/30/2022)   Humiliation, Afraid, Rape, and Kick questionnaire    Fear of Current or Ex-Partner: No    Emotionally Abused: No    Physically Abused: No    Sexually Abused: No    Review of Systems: See HPI, otherwise negative ROS  Physical Exam: BP 114/70 Comment: repeat  Pulse 72   Temp (!) 96.7 F (35.9 C) (Temporal)   Resp 14   Wt 80.6 kg   SpO2 92%   BMI 25.51 kg/m  General:   Alert,  pleasant and cooperative in NAD Head:  Normocephalic and atraumatic. Neck:  Supple; no masses or thyromegaly. Lungs:  Clear throughout to auscultation.    Heart:  Regular rate and rhythm. Abdomen:  Soft,  nontender and nondistended. Normal bowel sounds, without guarding, and without rebound.   Neurologic:  Alert and  oriented x4;  grossly normal neurologically.  Impression/Plan: Richard Vasquez is here for an endoscopy to be performed for dyspepsia  Risks, benefits, limitations, and alternatives regarding  endoscopy have been reviewed with the patient.  Questions have been answered.  All parties agreeable.   Rogelia Copping, MD  03/04/2023, 8:51 AM

## 2023-03-04 NOTE — Transfer of Care (Signed)
 Immediate Anesthesia Transfer of Care Note  Patient: Richard Vasquez  Procedure(s) Performed: ESOPHAGOGASTRODUODENOSCOPY (EGD) WITH PROPOFOL   Patient Location: PACU  Anesthesia Type:General  Level of Consciousness: awake  Airway & Oxygen Therapy: Patient Spontanous Breathing  Post-op Assessment: Report given to RN and Post -op Vital signs reviewed and stable  Post vital signs: Reviewed and stable  Last Vitals:  Vitals Value Taken Time  BP 84/59 03/04/23 0910  Temp    Pulse 74 03/04/23 0910  Resp    SpO2 94 % 03/04/23 0910    Last Pain:  Vitals:   03/04/23 0838  TempSrc: Temporal  PainSc: 0-No pain         Complications: There were no known notable events for this encounter.

## 2023-03-04 NOTE — Anesthesia Postprocedure Evaluation (Signed)
 Anesthesia Post Note  Patient: Richard Vasquez  Procedure(s) Performed: ESOPHAGOGASTRODUODENOSCOPY (EGD) WITH PROPOFOL  BALLOON DILATION BIOPSY  Patient location during evaluation: Endoscopy Anesthesia Type: General Level of consciousness: awake and alert Pain management: pain level controlled Vital Signs Assessment: post-procedure vital signs reviewed and stable Respiratory status: spontaneous breathing, nonlabored ventilation, respiratory function stable and patient connected to nasal cannula oxygen Cardiovascular status: blood pressure returned to baseline and stable Postop Assessment: no apparent nausea or vomiting Anesthetic complications: no   There were no known notable events for this encounter.   Last Vitals:  Vitals:   03/04/23 0920 03/04/23 0928  BP: 93/60 90/68  Pulse: 70 66  Resp:    Temp:    SpO2: 90% 90%    Last Pain:  Vitals:   03/04/23 0900  TempSrc: Temporal  PainSc:                  Richard Vasquez

## 2023-03-04 NOTE — Transfer of Care (Deleted)
 Immediate Anesthesia Transfer of Care Note  Patient: Richard Vasquez  Procedure(s) Performed: ESOPHAGOGASTRODUODENOSCOPY (EGD) WITH PROPOFOL   Patient Location: PACU  Anesthesia Type:General  Level of Consciousness: drowsy  Airway & Oxygen Therapy: Patient Spontanous Breathing  Post-op Assessment: Report given to RN and Post -op Vital signs reviewed and stable  Post vital signs: Reviewed and stable  Last Vitals:  Vitals Value Taken Time  BP 114/70 03/04/23 0841  Temp 35.9 C 03/04/23 0838  Pulse 72 03/04/23 0838  Resp 14 03/04/23 0838  SpO2 92 % 03/04/23 0838    Last Pain:  Vitals:   03/04/23 0838  TempSrc: Temporal  PainSc: 0-No pain         Complications: No notable events documented.

## 2023-03-04 NOTE — Anesthesia Preprocedure Evaluation (Signed)
Anesthesia Evaluation  Patient identified by MRN, date of birth, ID band Patient awake    Reviewed: Allergy & Precautions, NPO status , Patient's Chart, lab work & pertinent test results  History of Anesthesia Complications Negative for: history of anesthetic complications  Airway Mallampati: III  TM Distance: >3 FB Neck ROM: full    Dental  (+) Edentulous Upper, Edentulous Lower   Pulmonary sleep apnea and Continuous Positive Airway Pressure Ventilation , Current Smoker   Pulmonary exam normal        Cardiovascular hypertension, On Medications Normal cardiovascular exam     Neuro/Psych  PSYCHIATRIC DISORDERS Anxiety Depression  Schizophrenia  negative neurological ROS     GI/Hepatic ,GERD  Medicated,,(+) Hepatitis -, C  Endo/Other  negative endocrine ROS    Renal/GU negative Renal ROS  negative genitourinary   Musculoskeletal   Abdominal   Peds  Hematology  (+) Blood dyscrasia, anemia   Anesthesia Other Findings Past Medical History: No date: Anxiety No date: Arthritis No date: BPH (benign prostatic hyperplasia) 06/12/2018: C2 cervical fracture (HCC) No date: Chronic pain 06/10/2018: Closed displaced supracondylar fracture of distal end of  right femur with intracondylar extension (HCC) 06/09/2018: Closed fracture of distal end of right femur (HCC) 08/18/2018: Closed left hip fracture, initial encounter (HCC) No date: Depression No date: Hepatitis C No date: Paranoid schizophrenia (HCC) No date: 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(HCC) No date: Sleep apnea  Past Surgical History: 05/18/2020: COLONOSCOPY WITH PROPOFOL; N/A     Comment:  Procedure: COLONOSCOPY WITH PROPOFOL;  Surgeon: Midge Minium, MD;  Location: [facility] ENDOSCOPY;  Service:               Endoscopy;  Laterality: N/A; No date: FRACTURE SURGERY 08/18/2018: HIP PINNING,CANNULATED; Left     Comment:  Procedure: CANNULATED HIP PINNING,  Right knee               aspiration;  Surgeon: Juanell Fairly, MD;  Location:               [facility] ORS;  Service: Orthopedics;  Laterality: Left; No date: JOINT REPLACEMENT 06/10/2018: ORIF FEMUR FRACTURE; Right     Comment:  Procedure: OPEN REDUCTION INTERNAL FIXATION (ORIF)               DISTAL FEMUR FRACTURE;  Surgeon: Roby Lofts, MD;                Location: [facility] OR;  Service: Orthopedics;  Laterality:               Right; 05/17/2021: TIBIA IM NAIL INSERTION; Left     Comment:  Procedure: INTRAMEDULLARY NAILING OF LEFT TIBIA, STRESS               EXAM OF PELVIS;  Surgeon: Roby Lofts, MD;  Location:              [facility] OR;  Service: Orthopedics;  Laterality: Left;  BMI    Body Mass Index: 25.51 kg/m      Reproductive/Obstetrics negative OB ROS                             Anesthesia Physical Anesthesia Plan  ASA: 3  Anesthesia Plan: General   Post-op Pain Management: Minimal or no pain anticipated   Induction: Intravenous  PONV Risk Score and Plan: 1 and Propofol infusion and  TIVA  Airway Management Planned: Natural Airway and Nasal Cannula  Additional Equipment:   Intra-op Plan:   Post-operative Plan:   Informed Consent: I have reviewed the patients History and Physical, chart, labs and discussed the procedure including the risks, benefits and alternatives for the proposed anesthesia with the patient or authorized representative who has indicated his/her understanding and acceptance.     Dental Advisory Given  Plan Discussed with: Anesthesiologist, CRNA and Surgeon  Anesthesia Plan Comments: (Patient consented for risks of anesthesia including but not limited to:  - adverse reactions to medications - risk of airway placement if required - damage to eyes, teeth, lips or other oral mucosa - nerve damage due to positioning  - sore throat or hoarseness - Damage to heart, brain, nerves, lungs, other parts of body or loss of  life  Patient voiced understanding and assent.)       Anesthesia Quick Evaluation

## 2023-03-05 ENCOUNTER — Encounter: Payer: Self-pay | Admitting: Gastroenterology

## 2023-03-06 LAB — SURGICAL PATHOLOGY

## 2023-03-18 ENCOUNTER — Encounter: Payer: Self-pay | Admitting: Gastroenterology

## 2023-03-27 ENCOUNTER — Ambulatory Visit: Payer: MEDICAID | Admitting: Family Medicine

## 2023-03-27 ENCOUNTER — Encounter: Payer: Self-pay | Admitting: Family Medicine

## 2023-03-27 VITALS — BP 126/82 | HR 90 | Ht 70.0 in | Wt 186.9 lb

## 2023-03-27 DIAGNOSIS — R635 Abnormal weight gain: Secondary | ICD-10-CM | POA: Diagnosis not present

## 2023-03-27 DIAGNOSIS — K5900 Constipation, unspecified: Secondary | ICD-10-CM | POA: Diagnosis not present

## 2023-03-27 DIAGNOSIS — E782 Mixed hyperlipidemia: Secondary | ICD-10-CM

## 2023-03-27 DIAGNOSIS — J45909 Unspecified asthma, uncomplicated: Secondary | ICD-10-CM | POA: Insufficient documentation

## 2023-03-27 DIAGNOSIS — F17210 Nicotine dependence, cigarettes, uncomplicated: Secondary | ICD-10-CM

## 2023-03-27 DIAGNOSIS — I10 Essential (primary) hypertension: Secondary | ICD-10-CM

## 2023-03-27 DIAGNOSIS — J4520 Mild intermittent asthma, uncomplicated: Secondary | ICD-10-CM

## 2023-03-27 DIAGNOSIS — D509 Iron deficiency anemia, unspecified: Secondary | ICD-10-CM | POA: Diagnosis not present

## 2023-03-27 DIAGNOSIS — R21 Rash and other nonspecific skin eruption: Secondary | ICD-10-CM

## 2023-03-27 MED ORDER — ALBUTEROL SULFATE HFA 108 (90 BASE) MCG/ACT IN AERS: 2.0000 | INHALATION_SPRAY | Freq: Four times a day (QID) | RESPIRATORY_TRACT | 2 refills | Status: AC | PRN

## 2023-03-27 NOTE — Assessment & Plan Note (Signed)
 Remains controlled with lifestyle Goal< 120/80 DASH diet, exercise, water drink of choice

## 2023-03-27 NOTE — Assessment & Plan Note (Signed)
 Rash present on mid abdomen above umbilicus, crosses midline. Non painful, nonpruritic, no drainage, swelling, or tenderness ot palpation. Small pink round macular, with red dot in center, about 15-20. Pt states non bothersome.  No hx of bug bites or exposures No other area on body affected Appears non infectious during visit Pt asymptomatic - no chills, fevers, recent infections Angiomas vs petechiae given hx of iron deficiency vs dermatis Recent change in detergent Recommend monitoring Can use OTC hydrocortisone cream if area becomes itchy If worsens follow up sooner

## 2023-03-27 NOTE — Progress Notes (Signed)
 Established Patient Office Visit  Introduced to nurse practitioner role and practice setting.  All questions answered.  Discussed provider/patient relationship and expectations.   Subjective   Patient ID: Richard Vasquez, male    DOB: 1960/04/19  Age: 63 y.o. MRN: [id number]  Chief Complaint  Patient presents with   Follow-up    3 month follow up    Richard Vasquez is a 63 year old male with anemia who presents for 3 month follow up for fatigue related to iron anemia and concerns for weight gain.  He experiences persistent fatigue despite previous iron infusions. Blood work is scheduled for March 3rd to assess the effectiveness of the iron infusions administered three months ago. He attributes his tiredness to potentially low iron levels - plans to discussed more with hematologist.  He has gained approximately 2 pounds over the past year, increasing from 165 pounds to 186 pounds. He describes feeling bloated and attributes this to fluid retention. Despite the weight gain, his eating habits have not changed significantly, consuming mainly cereal and not eating excessively  Constipation - states goes 3 times per week about, max sometimes won't have BM for three days.   He notes small red marks on his abdomen, which are not itchy or painful. He recently switched detergents, which he suspects might be related to these marks.  No other concerns today.      03/27/2023    1:23 PM 12/30/2022    2:07 PM 12/24/2022    1:58 PM  Depression screen PHQ 2/9  Decreased Interest 0 0 0  Down, Depressed, Hopeless 0 0 0  PHQ - 2 Score 0 0 0  Altered sleeping 0  0  Tired, decreased energy 2  0  Change in appetite 0  0  Feeling bad or failure about yourself  0  0  Trouble concentrating 0  0  Moving slowly or fidgety/restless 0  0  Suicidal thoughts 0  0  PHQ-9 Score 2  0  Difficult doing work/chores Somewhat difficult  Not difficult at all       03/27/2023    1:23 PM 05/22/2022    2:22 PM   GAD 7 : Generalized Anxiety Score  Nervous, Anxious, on Edge 0 0  Control/stop worrying 0 0  Worry too much - different things 0 0  Trouble relaxing 0 0  Restless 0 0  Easily annoyed or irritable 0 0  Afraid - awful might happen 0 0  Total GAD 7 Score 0 0  Anxiety Difficulty Not difficult at all      Review of Systems  All other systems reviewed and are negative.   Negative unless indicated in HPI   Objective:     BP 126/82   Pulse 90   Ht 5\' 10"  (1.778 m)   Wt 186 lb 14.4 oz (84.8 kg)   SpO2 93%   BMI 26.82 kg/m    Physical Exam Constitutional:      General: He is not in acute distress.    Appearance: Normal appearance. He is not ill-appearing, toxic-appearing or diaphoretic.  HENT:     Head: Normocephalic.     Nose: Nose normal.     Mouth/Throat:     Mouth: Mucous membranes are moist.  Eyes:     Extraocular Movements: Extraocular movements intact.     Conjunctiva/sclera: Conjunctivae normal.     Pupils: Pupils are equal, round, and reactive to light.  Cardiovascular:     Rate and Rhythm:  Normal rate and regular rhythm.     Heart sounds: No murmur heard.    No friction rub. No gallop.  Pulmonary:     Effort: Pulmonary effort is normal. No respiratory distress.     Breath sounds: No stridor. Examination of the right-upper field reveals wheezing. Examination of the right-middle field reveals wheezing. Examination of the right-lower field reveals wheezing. Wheezing present. No rhonchi or rales.  Chest:     Chest wall: No tenderness.  Abdominal:     General: Bowel sounds are normal. There is no distension.     Palpations: There is no shifting dullness, fluid wave, hepatomegaly, splenomegaly, mass or pulsatile mass.     Tenderness: There is no abdominal tenderness. There is no right CVA tenderness, left CVA tenderness, guarding or rebound.     Hernia: A hernia is present. Hernia is present in the umbilical area.     Comments: Hernia nonbothersome   Musculoskeletal:     Right lower leg: No edema.     Left lower leg: No edema.  Skin:    General: Skin is warm and dry.     Capillary Refill: Capillary refill takes less than 2 seconds.     Findings: Rash present. Rash is macular. Rash is not crusting, nodular, papular, purpuric, pustular, scaling, urticarial or vesicular.          Comments: Several small macular circular rash across mid abdomen, crosses midline, none bothersome to pt, not pruritic, not painful, no crusting, drainage,subtle raised. No evidence of rash anywhere else on body. Appears non infectious.  Neurological:     General: No focal deficit present.     Mental Status: He is alert and oriented to person, place, and time. Mental status is at baseline.     Cranial Nerves: No cranial nerve deficit.     Sensory: No sensory deficit.     Motor: No weakness.     Coordination: Coordination normal.     Gait: Gait normal.  Psychiatric:        Mood and Affect: Mood normal.        Behavior: Behavior normal.        Thought Content: Thought content normal.        Judgment: Judgment normal.    No results found for any visits on 03/27/23.    The 10-year ASCVD risk score (Arnett DK, et al., 2019) is: 16%    Assessment & Plan:  Constipation, unspecified constipation type Assessment & Plan: Chronic, waxes and wanes Bowel movements every few other days, soft - Continue increasing daily fiber - Daily water intake recommend minimally 80oz - Continued daily stool softener - docusate sodium - prune juice - May use miralax as needed -d/c if diarrhea appears - Dulcolax as needed - stimulating, try softener first - Increase fruits and veggies - External abdominal wall massages to help stimulate large colon.     Weight gain Assessment & Plan: Pt has gained about 20lbs over past year, +5lbs in past three, BMI = 26.82 Pt concerned No evidence of swelling or edema found on exam Does not appear to be an acute change. Previous  thyroid function normal,  no hx of DMII Recommend Continue to make conscious decisions for well balanced diet smaller portions with increase protein, fruits, veggies, water as drink of choice, decrease starches, processed foods, and saturated fats. Increase weekly exercise - 150 minutes per week.     Iron deficiency anemia, unspecified iron deficiency anemia type Assessment & Plan: Persistent  fatigue despite recent iron infusions.  Hematologist follow-up scheduled for 03/31/2023 to assess efficacy of treatment. -Continue current iron infusion regimen as directed by hematologist. -Pt and wife state plans to have blood works checked on 03/31/23 by heme - Recent GI scope found no evidence of bleed source   Mild intermittent reactive airway disease without complication Assessment & Plan: Wheezing noted on right posterior lobes during examination.  No reported difficulty breathing, no resp distress during visit, no SOB Pt feels much improved from pneumonia in January Sp02 93% Chronic tobacco use, cigarette -Prescribe Albuterol inhaler prn - Recommend smoking cessation  Orders: -     Albuterol Sulfate HFA; Inhale 2 puffs into the lungs every 6 (six) hours as needed for wheezing or shortness of breath.  Dispense: 8 g; Refill: 2  Rash Assessment & Plan: Rash present on mid abdomen above umbilicus, crosses midline. Non painful, nonpruritic, no drainage, swelling, or tenderness ot palpation. Small pink round macular, with red dot in center, about 15-20. Pt states non bothersome.  No hx of bug bites or exposures No other area on body affected Appears non infectious during visit Pt asymptomatic - no chills, fevers, recent infections Angiomas vs petechiae given hx of iron deficiency vs dermatis Recent change in detergent Recommend monitoring Can use OTC hydrocortisone cream if area becomes itchy If worsens follow up sooner   Primary hypertension Assessment & Plan: Remains controlled with  lifestyle Goal< 120/80 DASH diet, exercise, water drink of choice   Mixed hyperlipidemia Assessment & Plan: Chronic Continue Crestor 40mg  daily Will plan to recheck lipids at f/u in 5 months Continue to make efforts for smoking cessation Decease saturated fats in diet   Nicotine dependence, cigarettes, uncomplicated Assessment & Plan: Chronic Discussed continued efforts of cessation     Return in about 5 months (around 08/24/2023) for chronic disease mgmt - 4-6 months.   I, Sallee Provencal, FNP, have reviewed all documentation for this visit. The documentation on 03/27/23 for the exam, diagnosis, procedures, and orders are all accurate and complete.   Sallee Provencal, FNP

## 2023-03-27 NOTE — Assessment & Plan Note (Addendum)
 Pt has gained about 20lbs over past year, +5lbs in past three, BMI = 26.82 Pt concerned No evidence of swelling or edema found on exam Does not appear to be an acute change. Previous thyroid function normal,  no hx of DMII Recommend Continue to make conscious decisions for well balanced diet smaller portions with increase protein, fruits, veggies, water as drink of choice, decrease starches, processed foods, and saturated fats. Increase weekly exercise - 150 minutes per week.

## 2023-03-27 NOTE — Assessment & Plan Note (Signed)
 Chronic, waxes and wanes Bowel movements every few other days, soft - Continue increasing daily fiber - Daily water intake recommend minimally 80oz - Continued daily stool softener - docusate sodium - prune juice - May use miralax as needed -d/c if diarrhea appears - Dulcolax as needed - stimulating, try softener first - Increase fruits and veggies - External abdominal wall massages to help stimulate large colon.

## 2023-03-27 NOTE — Assessment & Plan Note (Signed)
 Persistent fatigue despite recent iron infusions.  Hematologist follow-up scheduled for 03/31/2023 to assess efficacy of treatment. -Continue current iron infusion regimen as directed by hematologist. -Pt and wife state plans to have blood works checked on 03/31/23 by heme - Recent GI scope found no evidence of bleed source

## 2023-03-27 NOTE — Assessment & Plan Note (Signed)
 Chronic Discussed continued efforts of cessation

## 2023-03-27 NOTE — Assessment & Plan Note (Signed)
 Wheezing noted on right posterior lobes during examination.  No reported difficulty breathing, no resp distress during visit, no SOB Pt feels much improved from pneumonia in January Sp02 93% Chronic tobacco use, cigarette -Prescribe Albuterol inhaler prn - Recommend smoking cessation

## 2023-03-27 NOTE — Assessment & Plan Note (Signed)
 Chronic Continue Crestor 40mg  daily Will plan to recheck lipids at f/u in 5 months Continue to make efforts for smoking cessation Decease saturated fats in diet

## 2023-03-31 ENCOUNTER — Inpatient Hospital Stay: Payer: MEDICAID

## 2023-03-31 ENCOUNTER — Inpatient Hospital Stay: Payer: MEDICAID | Attending: Internal Medicine | Admitting: Internal Medicine

## 2023-03-31 ENCOUNTER — Encounter: Payer: Self-pay | Admitting: Internal Medicine

## 2023-03-31 VITALS — BP 118/68 | HR 65 | Temp 97.8°F | Resp 14 | Wt 186.0 lb

## 2023-03-31 DIAGNOSIS — D509 Iron deficiency anemia, unspecified: Secondary | ICD-10-CM

## 2023-03-31 DIAGNOSIS — F17210 Nicotine dependence, cigarettes, uncomplicated: Secondary | ICD-10-CM | POA: Insufficient documentation

## 2023-03-31 DIAGNOSIS — R635 Abnormal weight gain: Secondary | ICD-10-CM

## 2023-03-31 DIAGNOSIS — Z9884 Bariatric surgery status: Secondary | ICD-10-CM | POA: Diagnosis not present

## 2023-03-31 DIAGNOSIS — K7469 Other cirrhosis of liver: Secondary | ICD-10-CM

## 2023-03-31 LAB — CBC WITH DIFFERENTIAL/PLATELET
Abs Immature Granulocytes: 0.03 10*3/uL (ref 0.00–0.07)
Basophils Absolute: 0.1 10*3/uL (ref 0.0–0.1)
Basophils Relative: 1 %
Eosinophils Absolute: 0.4 10*3/uL (ref 0.0–0.5)
Eosinophils Relative: 4 %
HCT: 36.5 % — ABNORMAL LOW (ref 39.0–52.0)
Hemoglobin: 12.6 g/dL — ABNORMAL LOW (ref 13.0–17.0)
Immature Granulocytes: 0 %
Lymphocytes Relative: 18 %
Lymphs Abs: 1.7 10*3/uL (ref 0.7–4.0)
MCH: 31.3 pg (ref 26.0–34.0)
MCHC: 34.5 g/dL (ref 30.0–36.0)
MCV: 90.6 fL (ref 80.0–100.0)
Monocytes Absolute: 0.8 10*3/uL (ref 0.1–1.0)
Monocytes Relative: 8 %
Neutro Abs: 6.8 10*3/uL (ref 1.7–7.7)
Neutrophils Relative %: 69 %
Platelets: 158 10*3/uL (ref 150–400)
RBC: 4.03 MIL/uL — ABNORMAL LOW (ref 4.22–5.81)
RDW: 13.2 % (ref 11.5–15.5)
WBC: 9.9 10*3/uL (ref 4.0–10.5)
nRBC: 0 % (ref 0.0–0.2)

## 2023-03-31 LAB — IRON AND TIBC
Iron: 28 ug/dL — ABNORMAL LOW (ref 45–182)
Saturation Ratios: 9 % — ABNORMAL LOW (ref 17.9–39.5)
TIBC: 322 ug/dL (ref 250–450)
UIBC: 294 ug/dL

## 2023-03-31 LAB — FERRITIN: Ferritin: 277 ng/mL (ref 24–336)

## 2023-03-31 LAB — VITAMIN B12: Vitamin B-12: 891 pg/mL (ref 180–914)

## 2023-03-31 MED ORDER — FERROUS SULFATE 325 (65 FE) MG PO TBEC: 325.0000 mg | DELAYED_RELEASE_TABLET | ORAL | 0 refills | Status: DC

## 2023-03-31 NOTE — Progress Notes (Signed)
 Patient is still feeling very fatigue, his wife thinks that he looks pale. He feels like the iron infusions didn't help any. He wants to have his fatty liver disease panel checked.

## 2023-03-31 NOTE — Progress Notes (Signed)
 [facility]  Telephone:([phone]) [phone] Fax:(336) [phone]  ID: Richard Vasquez OB: [birth year]  MR#: [id number]  QIO#:[id number]  Patient Care Team: Sallee Provencal, FNP as PCP - General (Family Medicine) Wenda Overland, [place] as Social Worker Michaelyn Barter, MD as Consulting Physician (Oncology)  REFERRING PROVIDER: Merita Norton, NP  REASON FOR REFERRAL: Iron deficiency anemia  HPI: Richard Vasquez is a 63 y.o. male with past medical history of hyperlipidemia, schizophrenia, TBI was referred to hematology for iron deficiency anemia.  Patient saw his primary care for routine checkup. Labs from 12/24/2022.  CBC showed hemoglobin 12.5, WBC 15.5 and platelets 227.  Iron 20, saturation 6% and ferritin of 124. Denies any prior history of iron deficiency.  Reports black stools at least since 2 years.  Had colonoscopy with Dr. Servando Snare in 2022 which showed multiple polyps.  Pathology showed benign polyps. Denies history of gastric bypass surgery. Denies NSAID use. Gained 10 pounds in past 2 months. Reports nocturia.  Interval history Patient seen today accompanied with wife for iron deficiency anemia and labs. He completed iron infusions being of January 2025.  Does not report any improvement in his energy level.  Underwent endoscopy no source of bleeding identified.  Reports weight gain of about 20 pounds in the past 3 months.  Continues to feel tired.  REVIEW OF SYSTEMS:   ROS  As per HPI. Otherwise, a complete review of systems is negative.  PAST MEDICAL HISTORY: Past Medical History:  Diagnosis Date   Anxiety    Arthritis    BPH (benign prostatic hyperplasia)    C2 cervical fracture (HCC) 06/12/2018   Chronic pain    Closed displaced supracondylar fracture of distal end of right femur with intracondylar extension (HCC) 06/10/2018   Closed fracture of distal end of right femur (HCC) 06/09/2018   Closed left hip fracture, initial encounter (HCC) 08/18/2018    Depression    Hepatitis C    Paranoid schizophrenia (HCC)    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(HCC)    Sleep apnea     PAST SURGICAL HISTORY: Past Surgical History:  Procedure Laterality Date   BALLOON DILATION  03/04/2023   Procedure: BALLOON DILATION;  Surgeon: Midge Minium, MD;  Location: [facility] ENDOSCOPY;  Service: Endoscopy;;   BIOPSY  03/04/2023   Procedure: BIOPSY;  Surgeon: Midge Minium, MD;  Location: [facility] ENDOSCOPY;  Service: Endoscopy;;   COLONOSCOPY WITH PROPOFOL N/A 05/18/2020   Procedure: COLONOSCOPY WITH PROPOFOL;  Surgeon: Midge Minium, MD;  Location: [facility] ENDOSCOPY;  Service: Endoscopy;  Laterality: N/A;   ESOPHAGOGASTRODUODENOSCOPY (EGD) WITH PROPOFOL N/A 03/04/2023   Procedure: ESOPHAGOGASTRODUODENOSCOPY (EGD) WITH PROPOFOL;  Surgeon: Midge Minium, MD;  Location: [facility] ENDOSCOPY;  Service: Endoscopy;  Laterality: N/A;   FRACTURE SURGERY     HIP PINNING,CANNULATED Left 08/18/2018   Procedure: CANNULATED HIP PINNING, Right knee aspiration;  Surgeon: Juanell Fairly, MD;  Location: [facility] ORS;  Service: Orthopedics;  Laterality: Left;   JOINT REPLACEMENT     ORIF FEMUR FRACTURE Right 06/10/2018   Procedure: OPEN REDUCTION INTERNAL FIXATION (ORIF) DISTAL FEMUR FRACTURE;  Surgeon: Roby Lofts, MD;  Location: [facility] OR;  Service: Orthopedics;  Laterality: Right;   TIBIA IM NAIL INSERTION Left 05/17/2021   Procedure: INTRAMEDULLARY NAILING OF LEFT TIBIA, STRESS EXAM OF PELVIS;  Surgeon: Roby Lofts, MD;  Location: [facility] OR;  Service: Orthopedics;  Laterality: Left;    FAMILY HISTORY: Family History  Problem Relation Age of Onset   Cancer Mother  Diabetes Mother    Diabetes Paternal Aunt    Lung cancer Paternal Uncle     HEALTH MAINTENANCE: Social History   Tobacco Use   Smoking status: Every Day    Current packs/day: 0.50    Average packs/day: 1 pack/day for 22.1 years (21.5 ttl pk-yrs)    Types: Cigarettes    Start date: 02/28/2001    Passive exposure: Past   Smokeless  tobacco: Never  Vaping Use   Vaping status: Never Used  Substance Use Topics   Alcohol use: Not Currently   Drug use: Not Currently     No Known Allergies  Current Outpatient Medications  Medication Sig Dispense Refill   ferrous sulfate 325 (65 FE) MG EC tablet Take 1 tablet (325 mg total) by mouth 3 (three) times a week. 36 tablet 0   albuterol (VENTOLIN HFA) 108 (90 Base) MCG/ACT inhaler Inhale 2 puffs into the lungs every 6 (six) hours as needed for wheezing or shortness of breath. 8 g 2   gabapentin (NEURONTIN) 300 MG capsule Take 1 capsule (300 mg total) by mouth 3 (three) times daily. 270 capsule 3   hydrOXYzine (VISTARIL) 25 MG capsule Take 25 mg by mouth 4 (four) times daily as needed for anxiety.     OLANZapine (ZYPREXA) 10 MG tablet Take 10mg  by mouth once daily (Patient taking differently: Take 10 mg by mouth at bedtime.) 30 tablet 0   pantoprazole (PROTONIX) 40 MG tablet Take 1 tablet (40 mg total) by mouth daily. 90 tablet 0   QUEtiapine (SEROQUEL) 200 MG tablet Take 200 mg by mouth at bedtime.     QUEtiapine (SEROQUEL) 50 MG tablet Take 50 mg by mouth daily.     rosuvastatin (CRESTOR) 40 MG tablet Take 1 tablet (40 mg total) by mouth daily. 90 tablet 3   sertraline (ZOLOFT) 25 MG tablet Take 25 mg by mouth daily.     SUBOXONE 12-3 MG FILM Place under the tongue daily.     No current facility-administered medications for this visit.    OBJECTIVE: Vitals:   03/31/23 1426  BP: 118/68  Pulse: 65  Resp: 14  Temp: 97.8 F (36.6 C)  SpO2: 95%     Body mass index is 26.69 kg/m.      General: Well-developed, well-nourished, no acute distress. Eyes: Pink conjunctiva, anicteric sclera. HEENT: Normocephalic, moist mucous membranes, clear oropharnyx. Lungs: Clear to auscultation bilaterally. Heart: Regular rate and rhythm. No rubs, murmurs, or gallops. Abdomen: Soft, nontender, nondistended. No organomegaly noted, normoactive bowel sounds. Musculoskeletal: No edema,  cyanosis, or clubbing. Neuro: Alert, answering all questions appropriately. Cranial nerves grossly intact. Skin: No rashes or petechiae noted. Psych: Normal affect. Lymphatics: No cervical, calvicular, axillary or inguinal LAD.   LAB RESULTS:  Lab Results  Component Value Date   NA 132 (L) 01/25/2023   K 3.8 01/25/2023   CL 98 01/25/2023   CO2 23 01/25/2023   GLUCOSE 177 (H) 01/25/2023   BUN 14 01/25/2023   CREATININE 1.06 01/25/2023   CALCIUM 8.3 (L) 01/25/2023   PROT 6.5 01/25/2023   ALBUMIN 3.3 (L) 01/25/2023   AST 22 01/25/2023   ALT 13 01/25/2023   ALKPHOS 54 01/25/2023   BILITOT 0.4 01/25/2023   GFRNONAA >60 01/25/2023   GFRAA 119 03/20/2020    Lab Results  Component Value Date   WBC 15.7 (H) 01/25/2023   NEUTROABS 11.6 (H) 12/24/2022   HGB 10.6 (L) 01/25/2023   HCT 30.5 (L) 01/25/2023   MCV 88.4  01/25/2023   PLT 130 (L) 01/25/2023    Lab Results  Component Value Date   TIBC 323 12/24/2022   TIBC 343.0 03/22/2021   TIBC 328 05/09/2020   FERRITIN 124 12/24/2022   FERRITIN 72.6 03/22/2021   FERRITIN 214 05/09/2020   IRONPCTSAT 6 (LL) 12/24/2022   IRONPCTSAT 14.9 (L) 03/22/2021   IRONPCTSAT 19 05/09/2020     STUDIES: No results found.  ASSESSMENT AND PLAN:   Richard Vasquez is a 63 y.o. male with pmh of hyperlipidemia, schizophrenia, TBI was referred to hematology for iron deficiency anemia.  # Iron deficiency anemia -Of unknown etiology.  Labs reviewed from 12/24/2022.  Hemoglobin 12.5.  Iron 20, saturation 6% and ferritin 124.  Labs are consistent with iron deficiency anemia.  Completed IV Venofer in January 2025  -Will obtain labs today as below.  I will call the patient tomorrow to discuss and if infusions will schedule.  Advised to start oral iron 3 times a week.  Side effects discussed.  -Colonoscopy in 2022 by Dr. Servando Snare showed multiple polyps.  Pathology showed benign polyps.  Upper endoscopy on 03/04/2023 showed medium sized hiatal hernia.  1  benign-appearing intrinsic mild stenosis was found at the GE junction.  Stenosis was traversed with the dilator.  Diffuse mild inflammation at antrum.  Pathology showed chronic gastritis.  H. pylori negative.  We discussed if he continues to have issues with iron deficiency, may need to consider capsule endoscopy down the line.  # Weight gain -Check thyroid function test.  Orders Placed This Encounter  Procedures   CBC with Differential/Platelet   Comprehensive metabolic panel   Iron and TIBC   Ferritin   Vitamin B12   Thyroid Panel With TSH   Iron and TIBC([facility])   CBC with Differential (Cancer Center Only)   Ferritin   Vitamin B12   RTC in 6 months for MD visit, labs, possible Venofer  Patient expressed understanding and was in agreement with this plan. He also understands that He can call clinic at any time with any questions, concerns, or complaints.   I spent a total of 25 minutes reviewing chart data, face-to-face evaluation with the patient, counseling and coordination of care as detailed above.  Michaelyn Barter, MD   03/31/2023 3:01 PM

## 2023-04-04 ENCOUNTER — Ambulatory Visit: Payer: MEDICAID

## 2023-04-15 ENCOUNTER — Inpatient Hospital Stay: Payer: MEDICAID

## 2023-04-15 ENCOUNTER — Encounter: Payer: Self-pay | Admitting: Internal Medicine

## 2023-04-15 VITALS — BP 108/79 | HR 91 | Temp 97.4°F | Resp 16

## 2023-04-15 DIAGNOSIS — D509 Iron deficiency anemia, unspecified: Secondary | ICD-10-CM | POA: Diagnosis not present

## 2023-04-15 MED ORDER — IRON SUCROSE 20 MG/ML IV SOLN
200.0000 mg | Freq: Once | INTRAVENOUS | Status: AC
  Administered 2023-04-15: 200 mg via INTRAVENOUS
  Filled 2023-04-15: qty 10

## 2023-04-15 NOTE — Addendum Note (Signed)
 Addended byMichaelyn Barter on: 04/15/2023 01:25 PM   Modules accepted: Orders

## 2023-04-17 ENCOUNTER — Ambulatory Visit: Payer: MEDICAID | Admitting: Dermatology

## 2023-04-19 ENCOUNTER — Emergency Department: Payer: MEDICAID

## 2023-04-19 ENCOUNTER — Inpatient Hospital Stay
Admission: EM | Admit: 2023-04-19 | Discharge: 2023-04-21 | DRG: 866 | Disposition: A | Discharge status: Home / Self Care | Payer: MEDICAID | Attending: Internal Medicine | Admitting: Internal Medicine

## 2023-04-19 ENCOUNTER — Other Ambulatory Visit: Payer: Self-pay

## 2023-04-19 DIAGNOSIS — Z1152 Encounter for screening for COVID-19: Secondary | ICD-10-CM

## 2023-04-19 DIAGNOSIS — J189 Pneumonia, unspecified organism: Principal | ICD-10-CM

## 2023-04-19 DIAGNOSIS — D72829 Elevated white blood cell count, unspecified: Secondary | ICD-10-CM | POA: Diagnosis present

## 2023-04-19 DIAGNOSIS — A419 Sepsis, unspecified organism: Secondary | ICD-10-CM | POA: Diagnosis present

## 2023-04-19 DIAGNOSIS — R0902 Hypoxemia: Secondary | ICD-10-CM | POA: Diagnosis present

## 2023-04-19 DIAGNOSIS — B1920 Unspecified viral hepatitis C without hepatic coma: Secondary | ICD-10-CM | POA: Diagnosis present

## 2023-04-19 DIAGNOSIS — E871 Hypo-osmolality and hyponatremia: Secondary | ICD-10-CM | POA: Diagnosis present

## 2023-04-19 DIAGNOSIS — F32A Depression, unspecified: Secondary | ICD-10-CM | POA: Diagnosis present

## 2023-04-19 DIAGNOSIS — R000 Tachycardia, unspecified: Secondary | ICD-10-CM | POA: Diagnosis present

## 2023-04-19 DIAGNOSIS — K219 Gastro-esophageal reflux disease without esophagitis: Secondary | ICD-10-CM | POA: Diagnosis present

## 2023-04-19 DIAGNOSIS — G629 Polyneuropathy, unspecified: Secondary | ICD-10-CM | POA: Diagnosis present

## 2023-04-19 DIAGNOSIS — Z801 Family history of malignant neoplasm of trachea, bronchus and lung: Secondary | ICD-10-CM

## 2023-04-19 DIAGNOSIS — E860 Dehydration: Principal | ICD-10-CM | POA: Diagnosis present

## 2023-04-19 DIAGNOSIS — N400 Enlarged prostate without lower urinary tract symptoms: Secondary | ICD-10-CM | POA: Diagnosis present

## 2023-04-19 DIAGNOSIS — Z79899 Other long term (current) drug therapy: Secondary | ICD-10-CM

## 2023-04-19 DIAGNOSIS — F1021 Alcohol dependence, in remission: Secondary | ICD-10-CM | POA: Diagnosis present

## 2023-04-19 DIAGNOSIS — E878 Other disorders of electrolyte and fluid balance, not elsewhere classified: Secondary | ICD-10-CM | POA: Diagnosis present

## 2023-04-19 DIAGNOSIS — G8929 Other chronic pain: Secondary | ICD-10-CM | POA: Diagnosis present

## 2023-04-19 DIAGNOSIS — F200 Paranoid schizophrenia: Secondary | ICD-10-CM | POA: Diagnosis present

## 2023-04-19 DIAGNOSIS — F17210 Nicotine dependence, cigarettes, uncomplicated: Secondary | ICD-10-CM | POA: Diagnosis present

## 2023-04-19 DIAGNOSIS — R531 Weakness: Secondary | ICD-10-CM | POA: Diagnosis present

## 2023-04-19 DIAGNOSIS — B349 Viral infection, unspecified: Principal | ICD-10-CM | POA: Diagnosis present

## 2023-04-19 DIAGNOSIS — F419 Anxiety disorder, unspecified: Secondary | ICD-10-CM | POA: Diagnosis present

## 2023-04-19 DIAGNOSIS — R6510 Systemic inflammatory response syndrome (SIRS) of non-infectious origin without acute organ dysfunction: Secondary | ICD-10-CM | POA: Diagnosis present

## 2023-04-19 DIAGNOSIS — E861 Hypovolemia: Secondary | ICD-10-CM | POA: Diagnosis present

## 2023-04-19 DIAGNOSIS — Z833 Family history of diabetes mellitus: Secondary | ICD-10-CM

## 2023-04-19 LAB — BASIC METABOLIC PANEL
Anion gap: 10 (ref 5–15)
BUN: 9 mg/dL (ref 8–23)
CO2: 24 mmol/L (ref 22–32)
Calcium: 9 mg/dL (ref 8.9–10.3)
Chloride: 95 mmol/L — ABNORMAL LOW (ref 98–111)
Creatinine, Ser: 0.6 mg/dL — ABNORMAL LOW (ref 0.61–1.24)
GFR, Estimated: >60 mL/min (ref >60)
Glucose, Bld: 125 mg/dL — ABNORMAL HIGH (ref 70–99)
Potassium: 4 mmol/L (ref 3.5–5.1)
Sodium: 129 mmol/L — ABNORMAL LOW (ref 135–145)

## 2023-04-19 LAB — CBC
HCT: 36.3 % — ABNORMAL LOW (ref 39.0–52.0)
Hemoglobin: 12.7 g/dL — ABNORMAL LOW (ref 13.0–17.0)
MCH: 31.6 pg (ref 26.0–34.0)
MCHC: 35 g/dL (ref 30.0–36.0)
MCV: 90.3 fL (ref 80.0–100.0)
Platelets: 206 10*3/uL (ref 150–400)
RBC: 4.02 MIL/uL — ABNORMAL LOW (ref 4.22–5.81)
RDW: 12.5 % (ref 11.5–15.5)
WBC: 12.1 10*3/uL — ABNORMAL HIGH (ref 4.0–10.5)
nRBC: 0 % (ref 0.0–0.2)

## 2023-04-19 LAB — RESP PANEL BY RT-PCR (RSV, FLU A&B, COVID)  RVPGX2
Influenza A by PCR: NEGATIVE
Influenza B by PCR: NEGATIVE
Resp Syncytial Virus by PCR: NEGATIVE
SARS Coronavirus 2 by RT PCR: NEGATIVE

## 2023-04-19 LAB — TROPONIN I (HIGH SENSITIVITY): Troponin I (High Sensitivity): 6 ng/L (ref <18)

## 2023-04-19 NOTE — ED Triage Notes (Signed)
 Pt c/o fatigue that started this afternoon, wife states pt has anemia and has been getting iron infusions. Pt denies h/a, SHOB, pain, dizziness. Pt AOX4, NAD noted.  Oxygen 89% on RA- placed on 2 liters [place] in triage.

## 2023-04-19 NOTE — ED Triage Notes (Signed)
 FIRST NURSE NOTE:  Pt arrived via [facility] from home c/o low abd pain and shortness of breath, placed on 2L [place] with EMS for comfort and sats were 90%  EMS reports Fire Dept reported pt has hx of bed bugs, none seen on pt from EMS.

## 2023-04-20 ENCOUNTER — Inpatient Hospital Stay: Payer: MEDICAID

## 2023-04-20 DIAGNOSIS — E871 Hypo-osmolality and hyponatremia: Secondary | ICD-10-CM | POA: Diagnosis present

## 2023-04-20 DIAGNOSIS — R531 Weakness: Secondary | ICD-10-CM | POA: Diagnosis present

## 2023-04-20 DIAGNOSIS — Z1152 Encounter for screening for COVID-19: Secondary | ICD-10-CM | POA: Diagnosis not present

## 2023-04-20 DIAGNOSIS — F1021 Alcohol dependence, in remission: Secondary | ICD-10-CM | POA: Diagnosis present

## 2023-04-20 DIAGNOSIS — J189 Pneumonia, unspecified organism: Secondary | ICD-10-CM | POA: Diagnosis not present

## 2023-04-20 DIAGNOSIS — K219 Gastro-esophageal reflux disease without esophagitis: Secondary | ICD-10-CM | POA: Insufficient documentation

## 2023-04-20 DIAGNOSIS — E878 Other disorders of electrolyte and fluid balance, not elsewhere classified: Secondary | ICD-10-CM | POA: Diagnosis present

## 2023-04-20 DIAGNOSIS — Z833 Family history of diabetes mellitus: Secondary | ICD-10-CM | POA: Diagnosis not present

## 2023-04-20 DIAGNOSIS — A419 Sepsis, unspecified organism: Secondary | ICD-10-CM | POA: Diagnosis present

## 2023-04-20 DIAGNOSIS — R000 Tachycardia, unspecified: Secondary | ICD-10-CM | POA: Diagnosis present

## 2023-04-20 DIAGNOSIS — G8929 Other chronic pain: Secondary | ICD-10-CM | POA: Diagnosis present

## 2023-04-20 DIAGNOSIS — Z79899 Other long term (current) drug therapy: Secondary | ICD-10-CM | POA: Diagnosis not present

## 2023-04-20 DIAGNOSIS — B1920 Unspecified viral hepatitis C without hepatic coma: Secondary | ICD-10-CM | POA: Diagnosis present

## 2023-04-20 DIAGNOSIS — F419 Anxiety disorder, unspecified: Secondary | ICD-10-CM | POA: Diagnosis present

## 2023-04-20 DIAGNOSIS — G629 Polyneuropathy, unspecified: Secondary | ICD-10-CM

## 2023-04-20 DIAGNOSIS — E861 Hypovolemia: Secondary | ICD-10-CM | POA: Diagnosis present

## 2023-04-20 DIAGNOSIS — Z801 Family history of malignant neoplasm of trachea, bronchus and lung: Secondary | ICD-10-CM | POA: Diagnosis not present

## 2023-04-20 DIAGNOSIS — F32A Depression, unspecified: Secondary | ICD-10-CM | POA: Diagnosis present

## 2023-04-20 DIAGNOSIS — B349 Viral infection, unspecified: Secondary | ICD-10-CM | POA: Diagnosis present

## 2023-04-20 DIAGNOSIS — R0902 Hypoxemia: Secondary | ICD-10-CM | POA: Diagnosis present

## 2023-04-20 DIAGNOSIS — F17210 Nicotine dependence, cigarettes, uncomplicated: Secondary | ICD-10-CM | POA: Diagnosis present

## 2023-04-20 DIAGNOSIS — E860 Dehydration: Secondary | ICD-10-CM | POA: Diagnosis present

## 2023-04-20 DIAGNOSIS — F200 Paranoid schizophrenia: Secondary | ICD-10-CM | POA: Diagnosis present

## 2023-04-20 DIAGNOSIS — N400 Enlarged prostate without lower urinary tract symptoms: Secondary | ICD-10-CM | POA: Diagnosis present

## 2023-04-20 DIAGNOSIS — R6510 Systemic inflammatory response syndrome (SIRS) of non-infectious origin without acute organ dysfunction: Secondary | ICD-10-CM | POA: Diagnosis present

## 2023-04-20 DIAGNOSIS — D72829 Elevated white blood cell count, unspecified: Secondary | ICD-10-CM | POA: Diagnosis present

## 2023-04-20 LAB — URINALYSIS, W/ REFLEX TO CULTURE (INFECTION SUSPECTED)
Bacteria, UA: NONE SEEN
Bilirubin Urine: NEGATIVE
Glucose, UA: NEGATIVE mg/dL
Hgb urine dipstick: NEGATIVE
Ketones, ur: NEGATIVE mg/dL
Leukocytes,Ua: NEGATIVE
Nitrite: NEGATIVE
Protein, ur: NEGATIVE mg/dL
RBC / HPF: 0 RBC/hpf (ref 0–5)
Specific Gravity, Urine: 1.002 — ABNORMAL LOW (ref 1.005–1.030)
Squamous Epithelial / HPF: 0 /HPF (ref 0–5)
WBC, UA: 0 WBC/hpf (ref 0–5)
pH: 7 (ref 5.0–8.0)

## 2023-04-20 LAB — CBC
HCT: 37.3 % — ABNORMAL LOW (ref 39.0–52.0)
HCT: 36.8 % — ABNORMAL LOW (ref 39.0–52.0)
Hemoglobin: 12.7 g/dL — ABNORMAL LOW (ref 13.0–17.0)
Hemoglobin: 12.7 g/dL — ABNORMAL LOW (ref 13.0–17.0)
MCH: 31.3 pg (ref 26.0–34.0)
MCH: 31.4 pg (ref 26.0–34.0)
MCHC: 34 g/dL (ref 30.0–36.0)
MCHC: 34.5 g/dL (ref 30.0–36.0)
MCV: 91.9 fL (ref 80.0–100.0)
MCV: 90.9 fL (ref 80.0–100.0)
Platelets: 209 10*3/uL (ref 150–400)
Platelets: 208 10*3/uL (ref 150–400)
RBC: 4.06 MIL/uL — ABNORMAL LOW (ref 4.22–5.81)
RBC: 4.05 MIL/uL — ABNORMAL LOW (ref 4.22–5.81)
RDW: 12.5 % (ref 11.5–15.5)
RDW: 12.6 % (ref 11.5–15.5)
WBC: 8.7 10*3/uL (ref 4.0–10.5)
WBC: 10 10*3/uL (ref 4.0–10.5)
nRBC: 0 % (ref 0.0–0.2)
nRBC: 0 % (ref 0.0–0.2)

## 2023-04-20 LAB — RESPIRATORY PANEL BY PCR

## 2023-04-20 LAB — HIV ANTIBODY (ROUTINE TESTING W REFLEX): HIV Screen 4th Generation wRfx: NONREACTIVE

## 2023-04-20 LAB — BASIC METABOLIC PANEL
Anion gap: 8 (ref 5–15)
BUN: 7 mg/dL — ABNORMAL LOW (ref 8–23)
CO2: 27 mmol/L (ref 22–32)
Calcium: 9.3 mg/dL (ref 8.9–10.3)
Chloride: 105 mmol/L (ref 98–111)
Creatinine, Ser: 0.55 mg/dL — ABNORMAL LOW (ref 0.61–1.24)
GFR, Estimated: >60 mL/min (ref >60)
Glucose, Bld: 93 mg/dL (ref 70–99)
Potassium: 4.2 mmol/L (ref 3.5–5.1)
Sodium: 140 mmol/L (ref 135–145)

## 2023-04-20 LAB — HEPATIC FUNCTION PANEL
ALT: 15 U/L (ref 0–44)
AST: 22 U/L (ref 15–41)
Albumin: 3.6 g/dL (ref 3.5–5.0)
Alkaline Phosphatase: 60 U/L (ref 38–126)
Bilirubin, Direct: <0.1 mg/dL (ref 0.0–0.2)
Total Bilirubin: 0.7 mg/dL (ref 0.0–1.2)
Total Protein: 6.8 g/dL (ref 6.5–8.1)

## 2023-04-20 LAB — LIPASE, BLOOD: Lipase: 23 U/L (ref 11–51)

## 2023-04-20 LAB — PROCALCITONIN: Procalcitonin: <0.1 ng/mL

## 2023-04-20 LAB — LACTIC ACID, PLASMA
Lactic Acid, Venous: 0.9 mmol/L (ref 0.5–1.9)
Lactic Acid, Venous: 1.1 mmol/L (ref 0.5–1.9)

## 2023-04-20 LAB — TROPONIN I (HIGH SENSITIVITY): Troponin I (High Sensitivity): 6 ng/L (ref <18)

## 2023-04-20 LAB — CORTISOL-AM, BLOOD: Cortisol - AM: 15.4 ug/dL (ref 6.7–22.6)

## 2023-04-20 LAB — PROTIME-INR
INR: 1.1 (ref 0.8–1.2)
Prothrombin Time: 14.3 s (ref 11.4–15.2)

## 2023-04-20 LAB — BRAIN NATRIURETIC PEPTIDE: B Natriuretic Peptide: 43.9 pg/mL (ref 0.0–100.0)

## 2023-04-20 MED ORDER — TRAZODONE HCL 50 MG PO TABS: 25.0000 mg | ORAL_TABLET | Freq: Every evening | ORAL | Status: DC | PRN

## 2023-04-20 MED ORDER — PANTOPRAZOLE SODIUM 40 MG PO TBEC
40.0000 mg | DELAYED_RELEASE_TABLET | Freq: Every day | ORAL | Status: DC
Start: 2023-04-20 — End: 2023-04-21
  Administered 2023-04-20: 40 mg via ORAL
  Filled 2023-04-20 (×2): qty 1

## 2023-04-20 MED ORDER — ENOXAPARIN SODIUM 40 MG/0.4ML IJ SOSY
40.0000 mg | PREFILLED_SYRINGE | INTRAMUSCULAR | Status: DC
  Administered 2023-04-20: 40 mg via SUBCUTANEOUS
  Filled 2023-04-20: qty 0.4

## 2023-04-20 MED ORDER — METHYLPREDNISOLONE SODIUM SUCC 40 MG IJ SOLR
40.0000 mg | Freq: Every day | INTRAMUSCULAR | Status: DC
  Administered 2023-04-20: 40 mg via INTRAVENOUS
  Filled 2023-04-20: qty 1

## 2023-04-20 MED ORDER — GUAIFENESIN ER 600 MG PO TB12
600.0000 mg | ORAL_TABLET | Freq: Two times a day (BID) | ORAL | Status: DC
  Administered 2023-04-20 (×2): 600 mg via ORAL
  Filled 2023-04-20 (×3): qty 1

## 2023-04-20 MED ORDER — METOCLOPRAMIDE HCL 5 MG/ML IJ SOLN
10.0000 mg | Freq: Three times a day (TID) | INTRAMUSCULAR | Status: DC
  Administered 2023-04-20 – 2023-04-21 (×3): 10 mg via INTRAVENOUS
  Filled 2023-04-20 (×4): qty 2

## 2023-04-20 MED ORDER — BUPRENORPHINE HCL-NALOXONE HCL 8-2 MG SL SUBL
1.0000 | SUBLINGUAL_TABLET | Freq: Every day | SUBLINGUAL | Status: DC
  Administered 2023-04-20: 1 via SUBLINGUAL
  Filled 2023-04-20 (×2): qty 1

## 2023-04-20 MED ORDER — HYDROXYZINE HCL 25 MG PO TABS: 25.0000 mg | ORAL_TABLET | Freq: Four times a day (QID) | ORAL | Status: DC | PRN

## 2023-04-20 MED ORDER — OLANZAPINE 5 MG PO TABS
10.0000 mg | ORAL_TABLET | Freq: Every day | ORAL | Status: DC
  Administered 2023-04-20: 10 mg via ORAL
  Filled 2023-04-20: qty 2

## 2023-04-20 MED ORDER — ACETAMINOPHEN 650 MG RE SUPP: 650.0000 mg | Freq: Four times a day (QID) | RECTAL | Status: DC | PRN

## 2023-04-20 MED ORDER — LACTATED RINGERS IV SOLN
150.0000 mL/h | INTRAVENOUS | Status: DC
  Administered 2023-04-20: 150 mL/h via INTRAVENOUS

## 2023-04-20 MED ORDER — BUPRENORPHINE HCL-NALOXONE HCL 2-0.5 MG SL SUBL
2.0000 | SUBLINGUAL_TABLET | Freq: Every day | SUBLINGUAL | Status: DC
  Administered 2023-04-20: 2 via SUBLINGUAL
  Filled 2023-04-20: qty 2

## 2023-04-20 MED ORDER — SERTRALINE HCL 50 MG PO TABS
25.0000 mg | ORAL_TABLET | Freq: Every day | ORAL | Status: DC
  Administered 2023-04-20: 25 mg via ORAL
  Filled 2023-04-20 (×2): qty 1

## 2023-04-20 MED ORDER — FERROUS SULFATE 325 (65 FE) MG PO TABS
325.0000 mg | ORAL_TABLET | ORAL | Status: DC
  Filled 2023-04-20: qty 1

## 2023-04-20 MED ORDER — SODIUM CHLORIDE 0.9 % IV BOLUS
500.0000 mL | Freq: Once | INTRAVENOUS | Status: AC
  Administered 2023-04-20: 500 mL via INTRAVENOUS

## 2023-04-20 MED ORDER — HYDROCOD POLI-CHLORPHE POLI ER 10-8 MG/5ML PO SUER: 5.0000 mL | Freq: Two times a day (BID) | ORAL | Status: DC | PRN

## 2023-04-20 MED ORDER — ENOXAPARIN SODIUM 40 MG/0.4ML IJ SOSY: 40.0000 mg | PREFILLED_SYRINGE | INTRAMUSCULAR | Status: DC

## 2023-04-20 MED ORDER — SODIUM CHLORIDE 0.9 % IV SOLN
2.0000 g | Freq: Once | INTRAVENOUS | Status: AC
  Administered 2023-04-20: 2 g via INTRAVENOUS
  Filled 2023-04-20: qty 20

## 2023-04-20 MED ORDER — QUETIAPINE FUMARATE 25 MG PO TABS
200.0000 mg | ORAL_TABLET | Freq: Every day | ORAL | Status: DC
  Administered 2023-04-20: 200 mg via ORAL
  Filled 2023-04-20: qty 8

## 2023-04-20 MED ORDER — SODIUM CHLORIDE 0.9 % IV SOLN
500.0000 mg | INTRAVENOUS | Status: DC
  Administered 2023-04-20: 500 mg via INTRAVENOUS
  Filled 2023-04-20 (×2): qty 5

## 2023-04-20 MED ORDER — ONDANSETRON HCL 4 MG/2ML IJ SOLN
4.0000 mg | Freq: Four times a day (QID) | INTRAMUSCULAR | Status: DC | PRN
  Administered 2023-04-20: 4 mg via INTRAVENOUS
  Filled 2023-04-20: qty 2

## 2023-04-20 MED ORDER — ACETAMINOPHEN 325 MG PO TABS
650.0000 mg | ORAL_TABLET | Freq: Four times a day (QID) | ORAL | Status: DC | PRN
  Administered 2023-04-20: 650 mg via ORAL
  Filled 2023-04-20: qty 2

## 2023-04-20 MED ORDER — GABAPENTIN 300 MG PO CAPS
300.0000 mg | ORAL_CAPSULE | Freq: Three times a day (TID) | ORAL | Status: DC
Start: 2023-04-20 — End: 2023-04-21
  Administered 2023-04-20 (×3): 300 mg via ORAL
  Filled 2023-04-20 (×4): qty 1

## 2023-04-20 MED ORDER — IPRATROPIUM-ALBUTEROL 0.5-2.5 (3) MG/3ML IN SOLN: 3.0000 mL | RESPIRATORY_TRACT | Status: DC | PRN

## 2023-04-20 MED ORDER — ROSUVASTATIN CALCIUM 20 MG PO TABS
40.0000 mg | ORAL_TABLET | Freq: Every day | ORAL | Status: DC
Start: 2023-04-20 — End: 2023-04-21
  Administered 2023-04-20: 40 mg via ORAL
  Filled 2023-04-20 (×2): qty 2

## 2023-04-20 MED ORDER — MAGNESIUM HYDROXIDE 400 MG/5ML PO SUSP: 30.0000 mL | Freq: Every day | ORAL | Status: DC | PRN

## 2023-04-20 MED ORDER — SODIUM CHLORIDE 0.9 % IV SOLN
500.0000 mg | Freq: Once | INTRAVENOUS | Status: AC
  Administered 2023-04-20: 500 mg via INTRAVENOUS
  Filled 2023-04-20: qty 5

## 2023-04-20 MED ORDER — SODIUM CHLORIDE 0.9 % IV SOLN: 2.0000 g | INTRAVENOUS | Status: DC

## 2023-04-20 MED ORDER — ONDANSETRON HCL 4 MG PO TABS
4.0000 mg | ORAL_TABLET | Freq: Four times a day (QID) | ORAL | Status: DC | PRN
Start: 2023-04-20 — End: 2023-04-21

## 2023-04-20 MED ORDER — ALUM & MAG HYDROXIDE-SIMETH 200-200-20 MG/5ML PO SUSP
30.0000 mL | ORAL | Status: DC | PRN
  Administered 2023-04-20 (×2): 30 mL via ORAL
  Filled 2023-04-20 (×2): qty 30

## 2023-04-20 MED ORDER — IPRATROPIUM-ALBUTEROL 0.5-2.5 (3) MG/3ML IN SOLN
3.0000 mL | Freq: Four times a day (QID) | RESPIRATORY_TRACT | Status: DC
  Administered 2023-04-20 (×3): 3 mL via RESPIRATORY_TRACT
  Filled 2023-04-20 (×4): qty 3

## 2023-04-20 MED ORDER — QUETIAPINE FUMARATE 25 MG PO TABS: 50.0000 mg | ORAL_TABLET | Freq: Every day | ORAL | Status: DC | PRN

## 2023-04-20 NOTE — Sepsis Progress Note (Addendum)
 Elink following code sepsis.  0145: Secure chat sent to bedside RN to determine if blood cultures were drawn. RN said there was an error and the blood cultures were not drawn. Antibiotics were given at 0101.  0500: Secure chat sent to bedside RN to determine the barriers for blood cultures or lactic acid not being collected yet. Response pending. Will continue to monitor code sepsis.

## 2023-04-20 NOTE — Assessment & Plan Note (Signed)
 -  We will continue PPI therapy

## 2023-04-20 NOTE — ED Notes (Signed)
 Pharmacy contacted about pt suboxone dosage changes.

## 2023-04-20 NOTE — ED Provider Notes (Signed)
 [facility] Provider Note    Event Date/Time   First MD Initiated Contact with Patient 04/19/23 2257     (approximate)   History   Fatigue   HPI  Richard Vasquez is a 63 year old male presenting to the emergency department for evaluation of shortness of breath and weakness.  Patient reports that over the past few days he has felt generally weak.  More recently has had shortness of breath.  Triage note documents abdominal pain, but patient denies this to me.  Was noted to have mild hypoxia on presentation.  No known history of asthma or COPD.  Denies significant cough.     Physical Exam   Triage Vital Signs: ED Triage Vitals  Encounter Vitals Group     BP 04/19/23 2006 129/79     Systolic BP Percentile --      Diastolic BP Percentile --      Pulse Rate 04/19/23 2006 (!) 104     Resp 04/19/23 2006 16     Temp 04/19/23 2006 98.3 F (36.8 C)     Temp Source 04/19/23 2006 Oral     SpO2 04/19/23 2006 (!) 89 %     Weight --      Height --      Head Circumference --      Peak Flow --      Pain Score 04/19/23 2017 0     Pain Loc --      Pain Education --      Exclude from Growth Chart --     Most recent vital signs: Vitals:   04/20/23 0500 04/20/23 0720  BP: 117/86 110/72  Pulse: (!) 56 67  Resp: (!) 9 15  Temp:    SpO2: 96% 97%     General: Awake, interactive  CV:  Regular rate, good peripheral perfusion.  Resp:  Unlabored respirations, lung sounds mildly coarse, satting in the low to mid 90s on 2 L Abd:  Nondistended.  Neuro:  Symmetric facial movement, fluid speech   ED Results / Procedures / Treatments   Labs (all labs ordered are listed, but only abnormal results are displayed) Labs Reviewed  BASIC METABOLIC PANEL - Abnormal; Notable for the following components:      Result Value   Sodium 129 (*)    Chloride 95 (*)    Glucose, Bld 125 (*)    Creatinine, Ser 0.60 (*)    All other components within normal limits  CBC -  Abnormal; Notable for the following components:   WBC 12.1 (*)    RBC 4.02 (*)    Hemoglobin 12.7 (*)    HCT 36.3 (*)    All other components within normal limits  URINALYSIS, W/ REFLEX TO CULTURE (INFECTION SUSPECTED) - Abnormal; Notable for the following components:   Color, Urine COLORLESS (*)    APPearance CLEAR (*)    Specific Gravity, Urine 1.002 (*)    All other components within normal limits  CBC - Abnormal; Notable for the following components:   RBC 4.06 (*)    Hemoglobin 12.7 (*)    HCT 37.3 (*)    All other components within normal limits  RESP PANEL BY RT-PCR (RSV, FLU A&B, COVID)  RVPGX2  CULTURE, BLOOD (ROUTINE X 2)  CULTURE, BLOOD (ROUTINE X 2)  RESPIRATORY PANEL BY PCR  HEPATIC FUNCTION PANEL  LIPASE, BLOOD  LACTIC ACID, PLASMA  BRAIN NATRIURETIC PEPTIDE  PROTIME-INR  LACTIC ACID, PLASMA  HIV ANTIBODY (ROUTINE  TESTING W REFLEX)  CORTISOL-AM, BLOOD  CBC  CREATININE, SERUM  BASIC METABOLIC PANEL  TROPONIN I (HIGH SENSITIVITY)  TROPONIN I (HIGH SENSITIVITY)     EKG EKG independently reviewed interpreted by myself (ER attending) demonstrates:  EKG demonstrates normal sinus rhythm rate of 95, PR 184, QRS 158, QTc 497, left bundle branch block morphology noted, not new  RADIOLOGY Imaging independently reviewed and interpreted by myself demonstrates:  CXR with bibasilar infiltrates concerning for pneumonia given clinical history of weakness and hypoxia  PROCEDURES:  Critical Care performed: Yes, see critical care procedure note(s)  CRITICAL CARE Performed by: Trinna Post   Total critical care time: 30 minutes  Critical care time was exclusive of separately billable procedures and treating other patients.  Critical care was necessary to treat or prevent imminent or life-threatening deterioration.  Critical care was time spent personally by me on the following activities: development of treatment plan with patient and/or surrogate as well as nursing,  discussions with consultants, evaluation of patient's response to treatment, examination of patient, obtaining history from patient or surrogate, ordering and performing treatments and interventions, ordering and review of laboratory studies, ordering and review of radiographic studies, pulse oximetry and re-evaluation of patient's condition.   Procedures   MEDICATIONS ORDERED IN ED: Medications  cefTRIAXone (ROCEPHIN) 2 g in sodium chloride 0.9 % 100 mL IVPB (has no administration in time range)  azithromycin (ZITHROMAX) 500 mg in sodium chloride 0.9 % 250 mL IVPB (has no administration in time range)  acetaminophen (TYLENOL) tablet 650 mg (has no administration in time range)    Or  acetaminophen (TYLENOL) suppository 650 mg (has no administration in time range)  traZODone (DESYREL) tablet 25 mg (has no administration in time range)  ondansetron (ZOFRAN) tablet 4 mg (has no administration in time range)    Or  ondansetron (ZOFRAN) injection 4 mg (has no administration in time range)  magnesium hydroxide (MILK OF MAGNESIA) suspension 30 mL (has no administration in time range)  guaiFENesin (MUCINEX) 12 hr tablet 600 mg (has no administration in time range)  chlorpheniramine-HYDROcodone (TUSSIONEX) 10-8 MG/5ML suspension 5 mL (has no administration in time range)  ipratropium-albuterol (DUONEB) 0.5-2.5 (3) MG/3ML nebulizer solution 3 mL (has no administration in time range)  ipratropium-albuterol (DUONEB) 0.5-2.5 (3) MG/3ML nebulizer solution 3 mL (has no administration in time range)  enoxaparin (LOVENOX) injection 40 mg (has no administration in time range)  ferrous sulfate tablet 325 mg (has no administration in time range)  gabapentin (NEURONTIN) capsule 300 mg (has no administration in time range)  hydrOXYzine (ATARAX) tablet 25 mg (has no administration in time range)  OLANZapine (ZYPREXA) tablet 10 mg (has no administration in time range)  pantoprazole (PROTONIX) EC tablet 40 mg  (has no administration in time range)  QUEtiapine (SEROQUEL) tablet 200 mg (has no administration in time range)  QUEtiapine (SEROQUEL) tablet 50 mg (has no administration in time range)  rosuvastatin (CRESTOR) tablet 40 mg (has no administration in time range)  sertraline (ZOLOFT) tablet 25 mg (has no administration in time range)  buprenorphine-naloxone (SUBOXONE) 8-2 mg per SL tablet 1 tablet (has no administration in time range)    And  buprenorphine-naloxone (SUBOXONE) 2-0.5 mg per SL tablet 2 tablet (has no administration in time range)  cefTRIAXone (ROCEPHIN) 2 g in sodium chloride 0.9 % 100 mL IVPB (0 g Intravenous Stopped 04/20/23 0226)  azithromycin (ZITHROMAX) 500 mg in sodium chloride 0.9 % 250 mL IVPB (0 mg Intravenous Stopped 04/20/23 0225)  sodium chloride  0.9 % bolus 500 mL (0 mLs Intravenous Stopped 04/20/23 0415)     IMPRESSION / MDM / ASSESSMENT AND PLAN / ED COURSE  I reviewed the triage vital signs and the nursing notes.  Differential diagnosis includes, but is not limited to, pneumonia, viral illness, anemia, electrolyte abnormality, lower suspicion ACS, PE  Patient's presentation is most consistent with acute presentation with potential threat to life or bodily function.  63 year old male presenting with weakness and shortness of breath.  Found to be tachycardic and hypoxic on presentation.  X-Diontay Rosencrans is concerning for pneumonia.  Labs notable for leukocytosis, does also have tachycardia on presentation meeting sepsis criteria.  Ordered for empiric Rocephin and azithromycin.  Ordered for small fluid bolus as x-Ellsie Violette was concerning for possible fluid overload.  BNP added on.  With new onset hypoxia and concern for pneumonia, do think patient is appropriate for admission.  Will reach out to hospitalist team.  Case discussed with hospitalist team.  They will evaluate for anticipated admission.      FINAL CLINICAL IMPRESSION(S) / ED DIAGNOSES   Final diagnoses:  Pneumonia due  to infectious organism, unspecified laterality, unspecified part of lung     Rx / DC Orders   ED Discharge Orders     None        Note:  This document was prepared using Dragon voice recognition software and may include unintentional dictation errors.   Trinna Post, MD 04/20/23 [phone]

## 2023-04-20 NOTE — H&P (Signed)
 [place]   PATIENT NAME: Richard Vasquez    MR#:  [id number]  DATE OF BIRTH:  [birth year]  DATE OF ADMISSION:  04/19/2023  PRIMARY CARE PHYSICIAN: Sallee Provencal, FNP   Patient is coming from: Home  REQUESTING/REFERRING PHYSICIAN: Trinna Post, MD  CHIEF COMPLAINT:   Chief Complaint  Patient presents with   Fatigue    HISTORY OF PRESENT ILLNESS:  Richard Vasquez is a 63 y.o. Caucasian male with medical history significant for osteoarthritis, anxiety, BPH, chronic pain, depression, hepatitis C, paranoid schizophrenia and OSA, who presented to the emergency room with acute onset of generalized weakness with associated dyspnea and cough productive of greenish sputum as well as wheezing over the last 2 to 3 days.  He denies any fever or chills.  No nausea or vomiting or abdominal pain.  No headache or dizziness or blurred vision.  No dysuria, oliguria or hematuria or flank pain.  He denied any chest pain or palpitations.  ED Course: When the patient came to the ER, vital signs were within normal except for heart rate 104 and pulse oximetry 89% on room air and 92 to 94% on 2 L of O2 by nasal cannula.  Labs revealed hyponatremia 129 and hypochloremia 95 with otherwise unremarkable CMP.  BNP was 43.9 and high sensitive troponin I was 6 twice.  CBC showed leukocytosis 12.1 and mild anemia.   Respiratory panel came back negative.  UA was negative.  Blood cultures were drawn. EKG as reviewed by me : EKG showed normal sinus rhythm rate of 95 with left bundle branch block Imaging: Portable chest x-ray showed developing interstitial changes in the lungs most prominent in the bases that may represent atelectasis, interstitial pneumonia or possibly edema.  The patient was given IV Rocephin and Zithromax as well as 500 mL IV normal saline bolus.  He will be admitted to a medical telemetry bed for further evaluation and management. PAST MEDICAL HISTORY:   Past Medical History:  Diagnosis Date    Anxiety    Arthritis    BPH (benign prostatic hyperplasia)    C2 cervical fracture (HCC) 06/12/2018   Chronic pain    Closed displaced supracondylar fracture of distal end of right femur with intracondylar extension (HCC) 06/10/2018   Closed fracture of distal end of right femur (HCC) 06/09/2018   Closed left hip fracture, initial encounter (HCC) 08/18/2018   Depression    Hepatitis C    Paranoid schizophrenia (HCC)    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(HCC)    Sleep apnea     PAST SURGICAL HISTORY:   Past Surgical History:  Procedure Laterality Date   BALLOON DILATION  03/04/2023   Procedure: BALLOON DILATION;  Surgeon: Midge Minium, MD;  Location: [facility] ENDOSCOPY;  Service: Endoscopy;;   BIOPSY  03/04/2023   Procedure: BIOPSY;  Surgeon: Midge Minium, MD;  Location: [facility] ENDOSCOPY;  Service: Endoscopy;;   COLONOSCOPY WITH PROPOFOL N/A 05/18/2020   Procedure: COLONOSCOPY WITH PROPOFOL;  Surgeon: Midge Minium, MD;  Location: [facility] ENDOSCOPY;  Service: Endoscopy;  Laterality: N/A;   ESOPHAGOGASTRODUODENOSCOPY (EGD) WITH PROPOFOL N/A 03/04/2023   Procedure: ESOPHAGOGASTRODUODENOSCOPY (EGD) WITH PROPOFOL;  Surgeon: Midge Minium, MD;  Location: [facility] ENDOSCOPY;  Service: Endoscopy;  Laterality: N/A;   FRACTURE SURGERY     HIP PINNING,CANNULATED Left 08/18/2018   Procedure: CANNULATED HIP PINNING, Right knee aspiration;  Surgeon: Juanell Fairly, MD;  Location: [facility] ORS;  Service: Orthopedics;  Laterality: Left;   JOINT REPLACEMENT  ORIF FEMUR FRACTURE Right 06/10/2018   Procedure: OPEN REDUCTION INTERNAL FIXATION (ORIF) DISTAL FEMUR FRACTURE;  Surgeon: Roby Lofts, MD;  Location: [facility] OR;  Service: Orthopedics;  Laterality: Right;   TIBIA IM NAIL INSERTION Left 05/17/2021   Procedure: INTRAMEDULLARY NAILING OF LEFT TIBIA, STRESS EXAM OF PELVIS;  Surgeon: Roby Lofts, MD;  Location: [facility] OR;  Service: Orthopedics;  Laterality: Left;    SOCIAL HISTORY:   Social History   Tobacco Use    Smoking status: Every Day    Current packs/day: 0.50    Average packs/day: 1 pack/day for 22.1 years (21.5 ttl pk-yrs)    Types: Cigarettes    Start date: 02/28/2001    Passive exposure: Past   Smokeless tobacco: Never  Substance Use Topics   Alcohol use: Not Currently    FAMILY HISTORY:   Family History  Problem Relation Age of Onset   Cancer Mother    Diabetes Mother    Diabetes Paternal Aunt    Lung cancer Paternal Uncle     DRUG ALLERGIES:  No Known Allergies  REVIEW OF SYSTEMS:   ROS As per history of present illness. All pertinent systems were reviewed above. Constitutional, HEENT, cardiovascular, respiratory, GI, GU, musculoskeletal, neuro, psychiatric, endocrine, integumentary and hematologic systems were reviewed and are otherwise negative/unremarkable except for positive findings mentioned above in the HPI.   MEDICATIONS AT HOME:   Prior to Admission medications   Medication Sig Start Date End Date Taking? Authorizing Provider  albuterol (VENTOLIN HFA) 108 (90 Base) MCG/ACT inhaler Inhale 2 puffs into the lungs every 6 (six) hours as needed for wheezing or shortness of breath. 03/27/23  Yes Charlcie Cradle A, FNP  ferrous sulfate 325 (65 FE) MG EC tablet Take 1 tablet (325 mg total) by mouth 3 (three) times a week. 03/31/23 06/29/23 Yes Michaelyn Barter, MD  gabapentin (NEURONTIN) 300 MG capsule Take 1 capsule (300 mg total) by mouth 3 (three) times daily. 12/24/22  Yes Jacky Kindle, FNP  hydrOXYzine (VISTARIL) 25 MG capsule Take 25 mg by mouth 4 (four) times daily as needed for anxiety. 01/30/22  Yes [provider]  OLANZapine (ZYPREXA) 10 MG tablet Take 10mg  by mouth once daily Patient taking differently: Take 10 mg by mouth at bedtime. 06/15/21  Yes Sreenath, Sudheer B, MD  pantoprazole (PROTONIX) 40 MG tablet Take 1 tablet (40 mg total) by mouth daily. Patient taking differently: Take 40 mg by mouth daily as needed. 02/27/23  Yes Charlcie Cradle A, FNP   QUEtiapine (SEROQUEL) 200 MG tablet Take 200 mg by mouth at bedtime.   Yes [provider]  QUEtiapine (SEROQUEL) 50 MG tablet Take 50 mg by mouth daily as needed.   Yes [provider]  rosuvastatin (CRESTOR) 40 MG tablet Take 1 tablet (40 mg total) by mouth daily. 12/24/22  Yes Merita Norton T, FNP  sertraline (ZOLOFT) 25 MG tablet Take 25 mg by mouth daily.   Yes [provider]  SUBOXONE 12-3 MG FILM Place 0.33 Film under the tongue 2 (two) times daily with breakfast and lunch. 1/3 film under the tongue twice daily (cuts film)   Yes [provider]      VITAL SIGNS:  Blood pressure 110/72, pulse 67, temperature 97.6 F (36.4 C), temperature source Oral, resp. rate 15, SpO2 97%.  PHYSICAL EXAMINATION:  Physical Exam  GENERAL:  63 y.o.-year-old patient lying in the bed with no acute distress.  EYES: Pupils equal, round, reactive to light and accommodation.  No scleral icterus. Extraocular muscles intact.  HEENT: Head atraumatic, normocephalic. Oropharynx and nasopharynx clear.  NECK:  Supple, no jugular venous distention. No thyroid enlargement, no tenderness.  LUNGS: Diminished bibasal breath sounds with bibasal crackles.. No use of accessory muscles of respiration.  CARDIOVASCULAR: Regular rate and rhythm, S1, S2 normal. No murmurs, rubs, or gallops.  ABDOMEN: Soft, nondistended, nontender. Bowel sounds present. No organomegaly or mass.  EXTREMITIES: No pedal edema, cyanosis, or clubbing.  NEUROLOGIC: Cranial nerves II through XII are intact. Muscle strength 5/5 in all extremities. Sensation intact. Gait not checked.  PSYCHIATRIC: The patient is alert and oriented x 3.  Normal affect and good eye contact. SKIN: No obvious rash, lesion, or ulcer.   LABORATORY PANEL:   CBC Recent Labs  Lab 04/20/23 0525  WBC 8.7  HGB 12.7*  HCT 37.3*  PLT 209    ------------------------------------------------------------------------------------------------------------------  Chemistries  Recent Labs  Lab 04/19/23 2020 04/20/23 0005  NA 129*  --   K 4.0  --   CL 95*  --   CO2 24  --   GLUCOSE 125*  --   BUN 9  --   CREATININE 0.60*  --   CALCIUM 9.0  --   AST  --  22  ALT  --  15  ALKPHOS  --  60  BILITOT  --  0.7   ------------------------------------------------------------------------------------------------------------------  Cardiac Enzymes No results for input(s): "TROPONINI" in the last 168 hours. ------------------------------------------------------------------------------------------------------------------  RADIOLOGY:  DG Chest Port 1 View Result Date: 04/20/2023 CLINICAL DATA:  Weakness. Hypoxia. Fatigue starting this afternoon. History of anemia. EXAM: PORTABLE CHEST 1 VIEW COMPARISON:  02/24/2023 FINDINGS: Heart size and pulmonary vascularity are normal. Linear interstitial changes in the lungs, most prominent in the bases. This may represent atelectasis, interstitial pneumonia or less likely edema. No pleural effusion or pneumothorax. Mediastinal contours appear intact. Old rib fractures. Calcification of the aorta. IMPRESSION: Developing interstitial changes in the lungs, most prominent in the bases. This may represent atelectasis, interstitial pneumonia or possibly edema. Electronically Signed   By: Burman Nieves M.D.   On: 04/20/2023 00:03      IMPRESSION AND PLAN:  Assessment and Plan: * Sepsis due to pneumonia (HCC) - Sepsis is manifested by leukocytosis and tachycardia. - The patient will be admitted to a medical telemetry bed. - Will continue antibiotic therapy with IV Rocephin and Zithromax. - Mucolytic therapy be provided as well as duo nebs q.i.d. and q.4 hours p.r.n. - We will follow blood cultures. - The patient be hydrated with IV lactated ringer.   Hyponatremia - This is likely hypovolemic due  to mild dehydration. - He will be hydrated with IV lactated ringer and will follow BMP.  GERD without esophagitis - We will continue PPI therapy.  Peripheral neuropathy - We will continue Neurontin.  Paranoid schizophrenia (HCC) - We will continue his psychotropic medications.       DVT prophylaxis: Lovenox.  Advanced Care Planning:  Code Status: full code.  Family Communication:  The plan of care was discussed in details with the patient (and family). I answered all questions. The patient agreed to proceed with the above mentioned plan. Further management will depend upon hospital course. Disposition Plan: Back to previous home environment Consults called: none.  All the records are reviewed and case discussed with ED provider.  Status is: Inpatient    At the time of the admission, it appears that the appropriate admission status for this patient is inpatient.  This is  judged to be reasonable and necessary in order to provide the required intensity of service to ensure the patient's safety given the presenting symptoms, physical exam findings and initial radiographic and laboratory data in the context of comorbid conditions.  The patient requires inpatient status due to high intensity of service, high risk of further deterioration and high frequency of surveillance required.  I certify that at the time of admission, it is my clinical judgment that the patient will require inpatient hospital care extending more than 2 midnights.                            Dispo: The patient is from: Home              Anticipated d/c is to: Home              Patient currently is not medically stable to d/c.              Difficult to place patient: No  Hannah Beat M.D on 04/20/2023 at 7:47 AM  [facility]   From 7 PM-7 AM, contact night-coverage [email or web address]  CC: Primary care physician; Sallee Provencal, FNP

## 2023-04-20 NOTE — Plan of Care (Signed)
  Problem: Clinical Measurements: Goal: Diagnostic test results will improve Outcome: Progressing   Problem: Respiratory: Goal: Ability to maintain adequate ventilation will improve Outcome: Progressing   Problem: Activity: Goal: Risk for activity intolerance will decrease Outcome: Progressing   Problem: Safety: Goal: Ability to remain free from injury will improve Outcome: Progressing

## 2023-04-20 NOTE — Assessment & Plan Note (Signed)
-   Sepsis is manifested by leukocytosis and tachycardia. - The patient will be admitted to a medical telemetry bed. - Will continue antibiotic therapy with IV Rocephin and Zithromax. - Mucolytic therapy be provided as well as duo nebs q.i.d. and q.4 hours p.r.n. - We will follow blood cultures. - The patient be hydrated with IV lactated ringer.

## 2023-04-20 NOTE — Assessment & Plan Note (Signed)
 -  We will continue Neurontin. ?

## 2023-04-20 NOTE — Progress Notes (Signed)
 Brief hospitalist update note.  This is a nonbillable note.  Please see same-day H&P for full billable details.  Briefly, 63 year old male extensive past medical history presents with acute onset generalized weakness associated dyspnea and cough productive of greenish sputum as well as wheezing over the past 2 to 3 days.  No fevers or chills.  No obvious infiltrate to suggest bacterial pneumonia.  No fevers, no white count, procalcitonin negative.  Strong suspicion for viral syndrome.  Will discontinue Rocephin.  Continue azithromycin for 3 days for anti-inflammatory effect.  Check RVP.  Droplet isolation precaution for now.  Lolita Patella MD  No charge

## 2023-04-20 NOTE — ED Notes (Signed)
 Advised nurse that patient has ready bed

## 2023-04-20 NOTE — Progress Notes (Signed)
 CODE SEPSIS - PHARMACY COMMUNICATION  **Broad Spectrum Antibiotics should be administered within 1 hour of Sepsis diagnosis**  Time Code Sepsis Called/Page Received: 0045  Antibiotics Ordered: Azithromycin & Ceftriaxone  Time of 1st antibiotic administration: 0101  Otelia Sergeant, PharmD, [facility] 04/20/2023 12:56 AM

## 2023-04-20 NOTE — Assessment & Plan Note (Signed)
-   This is likely hypovolemic due to mild dehydration. - He will be hydrated with IV lactated ringer and will follow BMP.

## 2023-04-20 NOTE — Assessment & Plan Note (Signed)
-   We will continue his psychotropic medications.

## 2023-04-21 DIAGNOSIS — A419 Sepsis, unspecified organism: Secondary | ICD-10-CM | POA: Diagnosis not present

## 2023-04-21 DIAGNOSIS — J189 Pneumonia, unspecified organism: Secondary | ICD-10-CM | POA: Diagnosis not present

## 2023-04-21 MED ORDER — PREDNISONE 20 MG PO TABS
20.0000 mg | ORAL_TABLET | Freq: Every day | ORAL | 0 refills | Status: DC
Start: 2023-04-21 — End: 2023-04-26

## 2023-04-21 NOTE — Discharge Summary (Signed)
 Physician Discharge Summary  Richard Vasquez ZOX:[id number] DOB: [birth year] DOA: 04/19/2023  PCP: Sallee Provencal, FNP  Admit date: 04/19/2023 Discharge date: 04/21/2023  Admitted From: Home Disposition:  Home  Recommendations for Outpatient Follow-up:  Follow up with PCP in 1-2 weeks   Home Health:No  Equipment/Devices:None   Discharge Condition:Stable  CODE STATUS:FULL  Diet recommendation: Reg  Brief/Interim Summary: 63 year old male extensive past medical history presents with acute onset generalized weakness associated dyspnea and cough productive of greenish sputum as well as wheezing over the past 2 to 3 days. No fevers or chills. No obvious infiltrate to suggest bacterial pneumonia. No fevers, no white count, procalcitonin negative. Strong suspicion for viral syndrome.   Respiratory viral panel negative on 3/24.  Patient on room air.  No fevers over interval.  Respiratory status improved.  Suspect exposure to respiratory pathogen but clearance prior to her diagnostic tests.  Patient is back to baseline is a stable for discharge.  No indication for antibiotics.  Will treat suspected viral syndrome with short course of p.o. prednisone.  Can use albuterol MDI as needed.  No other changes made at home medication regimen.  Stable for discharge.  Follow-up outpatient PCP.    Discharge Diagnoses:  Principal Problem:   Sepsis due to pneumonia [facility]) Active Problems:   Hyponatremia   Paranoid schizophrenia (HCC)   Peripheral neuropathy   GERD without esophagitis  Sepsis due to pneumonia ruled out No evidence of pneumonia.  No documented viral syndrome.  Suspect SIRS criteria driven by previous exposure to viral syndrome.  Patient stable for discharge.  Recommend prednisone daily x 5 days.  Recommend albuterol MDI as needed.  No other changes made at home medication regimen.  Stable for discharge home.  Discharge Instructions  Discharge Instructions     Diet - low sodium heart  healthy   Complete by: As directed    Increase activity slowly   Complete by: As directed       Allergies as of 04/21/2023   No Known Allergies      Medication List     TAKE these medications    albuterol 108 (90 Base) MCG/ACT inhaler Commonly known as: VENTOLIN HFA Inhale 2 puffs into the lungs every 6 (six) hours as needed for wheezing or shortness of breath.   ferrous sulfate 325 (65 FE) MG EC tablet Take 1 tablet (325 mg total) by mouth 3 (three) times a week.   gabapentin 300 MG capsule Commonly known as: NEURONTIN Take 1 capsule (300 mg total) by mouth 3 (three) times daily.   hydrOXYzine 25 MG capsule Commonly known as: VISTARIL Take 25 mg by mouth 4 (four) times daily as needed for anxiety.   OLANZapine 10 MG tablet Commonly known as: ZYPREXA Take 10mg  by mouth once daily What changed:  how much to take when to take this   pantoprazole 40 MG tablet Commonly known as: PROTONIX Take 1 tablet (40 mg total) by mouth daily. What changed:  when to take this reasons to take this   predniSONE 20 MG tablet Commonly known as: DELTASONE Take 1 tablet (20 mg total) by mouth daily with breakfast for 5 days.   QUEtiapine 50 MG tablet Commonly known as: SEROQUEL Take 50 mg by mouth daily as needed.   QUEtiapine 200 MG tablet Commonly known as: SEROQUEL Take 200 mg by mouth at bedtime.   rosuvastatin 40 MG tablet Commonly known as: CRESTOR Take 1 tablet (40 mg total) by mouth daily.  sertraline 25 MG tablet Commonly known as: ZOLOFT Take 25 mg by mouth daily.   Suboxone 12-3 MG Film Generic drug: Buprenorphine HCl-Naloxone HCl Place 0.33 Film under the tongue 2 (two) times daily with breakfast and lunch. 1/3 film under the tongue twice daily (cuts film)        No Known Allergies  Consultations: None   Procedures/Studies: DG Abd 1 View Result Date: 04/20/2023 CLINICAL DATA:  Abdominal pain EXAM: ABDOMEN - 1 VIEW COMPARISON:  02/10/2022  FINDINGS: 2 supine views of the abdomen and pelvis. Left proximal femur fixation. Moderate amount of stool throughout the colon. No evidence of bowel obstruction or gross free intraperitoneal air. No abnormal abdominal calcifications. No appendicolith. Right pelvic calcification is most likely a phlebolith. Distal gas and stool. IMPRESSION: No acute findings. Electronically Signed   By: Jeronimo Greaves M.D.   On: 04/20/2023 12:05   CT CHEST WO CONTRAST Result Date: 04/20/2023 CLINICAL DATA:  Pneumonia. EXAM: CT CHEST WITHOUT CONTRAST TECHNIQUE: Multidetector CT imaging of the chest was performed following the standard protocol without IV contrast. RADIATION DOSE REDUCTION: This exam was performed according to the departmental dose-optimization program which includes automated exposure control, adjustment of the mA and/or kV according to patient size and/or use of iterative reconstruction technique. COMPARISON:  Chest radiograph 04/19/2023 and CT chest 01/15/2022. FINDINGS: Cardiovascular: Atherosclerotic calcification of the aorta and aortic valve with age advanced involvement of all 3 coronary arteries. Enlarged right and left pulmonary arteries. Heart size normal. No pericardial effusion. Mediastinum/Nodes: No pathologically enlarged mediastinal or axillary lymph nodes. Hilar regions are difficult to definitively evaluate without IV contrast. Fluid and air in the esophagus are indicative of dysmotility. Lungs/Pleura: Centrilobular and paraseptal emphysema. Calcified granulomas. Trace bilateral pleural effusions with dependent atelectasis. Endobronchial debris in the lower lobes bilaterally. Upper Abdomen: Mild heterogeneity in the liver. Visualized portions of the liver, gallbladder, adrenal glands, kidneys, spleen, pancreas, stomach and bowel are otherwise grossly unremarkable. No upper abdominal adenopathy. Musculoskeletal: Degenerative changes in the spine. Slight compression of the T4 vertebral body superior  endplate, unchanged. Old left rib fractures. IMPRESSION: 1. No evidence of pneumonia. 2. Fluid and air in the esophagus suggests dysmotility. Endobronchial debris in both lower lobes may reflect aspiration. 3. Age advanced three-vessel coronary artery calcification. 4. Liver appears mildly steatotic. 5.  Aortic atherosclerosis (ICD10-I70.0). 6. Enlarged right and left pulmonary arteries, indicative of pulmonary arterial hypertension. 7.  Emphysema (ICD10-J43.9). Electronically Signed   By: Leanna Battles M.D.   On: 04/20/2023 09:47   DG Chest Port 1 View Result Date: 04/20/2023 CLINICAL DATA:  Weakness. Hypoxia. Fatigue starting this afternoon. History of anemia. EXAM: PORTABLE CHEST 1 VIEW COMPARISON:  02/24/2023 FINDINGS: Heart size and pulmonary vascularity are normal. Linear interstitial changes in the lungs, most prominent in the bases. This may represent atelectasis, interstitial pneumonia or less likely edema. No pleural effusion or pneumothorax. Mediastinal contours appear intact. Old rib fractures. Calcification of the aorta. IMPRESSION: Developing interstitial changes in the lungs, most prominent in the bases. This may represent atelectasis, interstitial pneumonia or possibly edema. Electronically Signed   By: Burman Nieves M.D.   On: 04/20/2023 00:03      Subjective: Seen and examined on the day of discharge.  Stable no distress.  Appropriate for discharge home.  Discharge Exam: Vitals:   04/21/23 0527 04/21/23 0742  BP: 137/87 128/72  Pulse: 76 81  Resp: 17 18  Temp: (!) 97.4 F (36.3 C) 98.3 F (36.8 C)  SpO2: 92% 94%  Vitals:   04/20/23 2122 04/20/23 2232 04/21/23 0527 04/21/23 0742  BP:  109/65 137/87 128/72  Pulse:  62 76 81  Resp:  15 17 18   Temp:  98 F (36.7 C) (!) 97.4 F (36.3 C) 98.3 F (36.8 C)  TempSrc:   Oral Oral  SpO2: 93% 92% 92% 94%    General: Pt is alert, awake, not in acute distress Cardiovascular: RRR, S1/S2 +, no rubs, no  gallops Respiratory: CTA bilaterally, no wheezing, no rhonchi Abdominal: Soft, NT, ND, bowel sounds + Extremities: no edema, no cyanosis    The results of significant diagnostics from this hospitalization (including imaging, microbiology, ancillary and laboratory) are listed below for reference.     Microbiology: Recent Results (from the past 240 hours)  Resp panel by RT-PCR (RSV, Flu A&B, Covid) Anterior Nasal Swab     Status: None   Collection Time: 04/19/23  8:45 PM   Specimen: Anterior Nasal Swab  Result Value Ref Range Status   SARS Coronavirus 2 by RT PCR NEGATIVE NEGATIVE Final    Comment: (NOTE) SARS-CoV-2 target nucleic acids are NOT DETECTED.  The SARS-CoV-2 RNA is generally detectable in upper respiratory specimens during the acute phase of infection. The lowest concentration of SARS-CoV-2 viral copies this assay can detect is 138 copies/mL. A negative result does not preclude SARS-Cov-2 infection and should not be used as the sole basis for treatment or other patient management decisions. A negative result may occur with  improper specimen collection/handling, submission of specimen other than nasopharyngeal swab, presence of viral mutation(s) within the areas targeted by this assay, and inadequate number of viral copies(<138 copies/mL). A negative result must be combined with clinical observations, patient history, and epidemiological information. The expected result is Negative.  Fact Sheet for Patients:  BloggerCourse.com  Fact Sheet for Healthcare Providers:  SeriousBroker.it  This test is no t yet approved or cleared by the [place] FDA and  has been authorized for detection and/or diagnosis of SARS-CoV-2 by FDA under an Emergency Use Authorization (EUA). This EUA will remain  in effect (meaning this test can be used) for the duration of the COVID-19 declaration under Section 564(b)(1) of the Act,  21 U.S.C.section 360bbb-3(b)(1), unless the authorization is terminated  or revoked sooner.       Influenza A by PCR NEGATIVE NEGATIVE Final   Influenza B by PCR NEGATIVE NEGATIVE Final    Comment: (NOTE) The Xpert Xpress SARS-CoV-2/FLU/RSV plus assay is intended as an aid in the diagnosis of influenza from Nasopharyngeal swab specimens and should not be used as a sole basis for treatment. Nasal washings and aspirates are unacceptable for Xpert Xpress SARS-CoV-2/FLU/RSV testing.  Fact Sheet for Patients: BloggerCourse.com  Fact Sheet for Healthcare Providers: SeriousBroker.it  This test is not yet approved or cleared by the [place] FDA and has been authorized for detection and/or diagnosis of SARS-CoV-2 by FDA under an Emergency Use Authorization (EUA). This EUA will remain in effect (meaning this test can be used) for the duration of the COVID-19 declaration under Section 564(b)(1) of the Act, 21 U.S.C. section 360bbb-3(b)(1), unless the authorization is terminated or revoked.     Resp Syncytial Virus by PCR NEGATIVE NEGATIVE Final    Comment: (NOTE) Fact Sheet for Patients: BloggerCourse.com  Fact Sheet for Healthcare Providers: SeriousBroker.it  This test is not yet approved or cleared by the [place] FDA and has been authorized for detection and/or diagnosis of SARS-CoV-2 by FDA under an Emergency Use Authorization (EUA).  This EUA will remain in effect (meaning this test can be used) for the duration of the COVID-19 declaration under Section 564(b)(1) of the Act, 21 U.S.C. section 360bbb-3(b)(1), unless the authorization is terminated or revoked.  Performed at [facility], [address] [place], [place] [address]   Blood Culture (routine x 2)     Status: None (Preliminary result)   Collection Time: 04/20/23  5:26 AM   Specimen: BLOOD  Result  Value Ref Range Status   Specimen Description BLOOD  Final   Special Requests   Final    BOTTLES DRAWN AEROBIC AND ANAEROBIC Blood Culture adequate volume   Culture   Final    NO GROWTH 1 DAY Performed at [facility], [address] [place], [place] [address]    Report Status PENDING  Incomplete  Blood Culture (routine x 2)     Status: None (Preliminary result)   Collection Time: 04/20/23  5:26 AM   Specimen: BLOOD  Result Value Ref Range Status   Specimen Description BLOOD BLOOD LEFT ARM  Final   Special Requests   Final    BOTTLES DRAWN AEROBIC AND ANAEROBIC Blood Culture results may not be optimal due to an inadequate volume of blood received in culture bottles   Culture   Final    NO GROWTH 1 DAY Performed at [facility], [address], [place], [place] [address]    Report Status PENDING  Incomplete  Respiratory (~20 pathogens) panel by PCR     Status: None   Collection Time: 04/20/23  9:01 AM   Specimen: Nasopharyngeal Swab; Respiratory  Result Value Ref Range Status   Adenovirus NOT DETECTED NOT DETECTED Final   Coronavirus 229E NOT DETECTED NOT DETECTED Final    Comment: (NOTE) The Coronavirus on the Respiratory Panel, DOES NOT test for the novel  Coronavirus (2019 nCoV)    Coronavirus HKU1 NOT DETECTED NOT DETECTED Final   Coronavirus NL63 NOT DETECTED NOT DETECTED Final   Coronavirus OC43 NOT DETECTED NOT DETECTED Final   Metapneumovirus NOT DETECTED NOT DETECTED Final   Rhinovirus / Enterovirus NOT DETECTED NOT DETECTED Final   Influenza A NOT DETECTED NOT DETECTED Final   Influenza B NOT DETECTED NOT DETECTED Final   Parainfluenza Virus 1 NOT DETECTED NOT DETECTED Final   Parainfluenza Virus 2 NOT DETECTED NOT DETECTED Final   Parainfluenza Virus 3 NOT DETECTED NOT DETECTED Final   Parainfluenza Virus 4 NOT DETECTED NOT DETECTED Final   Respiratory Syncytial Virus NOT DETECTED NOT DETECTED Final   Bordetella pertussis NOT DETECTED NOT  DETECTED Final   Bordetella Parapertussis NOT DETECTED NOT DETECTED Final   Chlamydophila pneumoniae NOT DETECTED NOT DETECTED Final   Mycoplasma pneumoniae NOT DETECTED NOT DETECTED Final    Comment: Performed at [facility], [address], [place], [place] [address]     Labs: BNP (last 3 results) Recent Labs    04/20/23 0526  BNP 43.9   Basic Metabolic Panel: Recent Labs  Lab 04/19/23 2020 04/20/23 0815  NA 129* 140  K 4.0 4.2  CL 95* 105  CO2 24 27  GLUCOSE 125* 93  BUN 9 7*  CREATININE 0.60* 0.55*  CALCIUM 9.0 9.3   Liver Function Tests: Recent Labs  Lab 04/20/23 0005  AST 22  ALT 15  ALKPHOS 60  BILITOT 0.7  PROT 6.8  ALBUMIN 3.6   Recent Labs  Lab 04/20/23 0005  LIPASE 23   No results for input(s): "AMMONIA"  in the last 168 hours. CBC: Recent Labs  Lab 04/19/23 2020 04/20/23 0525 04/20/23 0817  WBC 12.1* 8.7 10.0  HGB 12.7* 12.7* 12.7*  HCT 36.3* 37.3* 36.8*  MCV 90.3 91.9 90.9  PLT 206 209 208   Cardiac Enzymes: No results for input(s): "CKTOTAL", "CKMB", "CKMBINDEX", "TROPONINI" in the last 168 hours. BNP: Invalid input(s): "POCBNP" CBG: No results for input(s): "GLUCAP" in the last 168 hours. D-Dimer No results for input(s): "DDIMER" in the last 72 hours. Hgb A1c No results for input(s): "HGBA1C" in the last 72 hours. Lipid Profile No results for input(s): "CHOL", "HDL", "LDLCALC", "TRIG", "CHOLHDL", "LDLDIRECT" in the last 72 hours. Thyroid function studies No results for input(s): "TSH", "T4TOTAL", "T3FREE", "THYROIDAB" in the last 72 hours.  Invalid input(s): "FREET3" Anemia work up No results for input(s): "VITAMINB12", "FOLATE", "FERRITIN", "TIBC", "IRON", "RETICCTPCT" in the last 72 hours. Urinalysis    Component Value Date/Time   COLORURINE COLORLESS (A) 04/20/2023 0526   APPEARANCEUR CLEAR (A) 04/20/2023 0526   LABSPEC 1.002 (L) 04/20/2023 0526   PHURINE 7.0 04/20/2023 0526   GLUCOSEU NEGATIVE 04/20/2023 0526    HGBUR NEGATIVE 04/20/2023 0526   BILIRUBINUR NEGATIVE 04/20/2023 0526   BILIRUBINUR negative 03/17/2020 1534   KETONESUR NEGATIVE 04/20/2023 0526   PROTEINUR NEGATIVE 04/20/2023 0526   UROBILINOGEN 0.2 03/17/2020 1534   NITRITE NEGATIVE 04/20/2023 0526   LEUKOCYTESUR NEGATIVE 04/20/2023 0526   Sepsis Labs Recent Labs  Lab 04/19/23 2020 04/20/23 0525 04/20/23 0817  WBC 12.1* 8.7 10.0   Microbiology Recent Results (from the past 240 hours)  Resp panel by RT-PCR (RSV, Flu A&B, Covid) Anterior Nasal Swab     Status: None   Collection Time: 04/19/23  8:45 PM   Specimen: Anterior Nasal Swab  Result Value Ref Range Status   SARS Coronavirus 2 by RT PCR NEGATIVE NEGATIVE Final    Comment: (NOTE) SARS-CoV-2 target nucleic acids are NOT DETECTED.  The SARS-CoV-2 RNA is generally detectable in upper respiratory specimens during the acute phase of infection. The lowest concentration of SARS-CoV-2 viral copies this assay can detect is 138 copies/mL. A negative result does not preclude SARS-Cov-2 infection and should not be used as the sole basis for treatment or other patient management decisions. A negative result may occur with  improper specimen collection/handling, submission of specimen other than nasopharyngeal swab, presence of viral mutation(s) within the areas targeted by this assay, and inadequate number of viral copies(<138 copies/mL). A negative result must be combined with clinical observations, patient history, and epidemiological information. The expected result is Negative.  Fact Sheet for Patients:  BloggerCourse.com  Fact Sheet for Healthcare Providers:  SeriousBroker.it  This test is no t yet approved or cleared by the [place] FDA and  has been authorized for detection and/or diagnosis of SARS-CoV-2 by FDA under an Emergency Use Authorization (EUA). This EUA will remain  in effect (meaning this test can  be used) for the duration of the COVID-19 declaration under Section 564(b)(1) of the Act, 21 U.S.C.section 360bbb-3(b)(1), unless the authorization is terminated  or revoked sooner.       Influenza A by PCR NEGATIVE NEGATIVE Final   Influenza B by PCR NEGATIVE NEGATIVE Final    Comment: (NOTE) The Xpert Xpress SARS-CoV-2/FLU/RSV plus assay is intended as an aid in the diagnosis of influenza from Nasopharyngeal swab specimens and should not be used as a sole basis for treatment. Nasal washings and aspirates are unacceptable for Xpert Xpress SARS-CoV-2/FLU/RSV testing.  Fact Sheet for  Patients: BloggerCourse.com  Fact Sheet for Healthcare Providers: SeriousBroker.it  This test is not yet approved or cleared by the [place] FDA and has been authorized for detection and/or diagnosis of SARS-CoV-2 by FDA under an Emergency Use Authorization (EUA). This EUA will remain in effect (meaning this test can be used) for the duration of the COVID-19 declaration under Section 564(b)(1) of the Act, 21 U.S.C. section 360bbb-3(b)(1), unless the authorization is terminated or revoked.     Resp Syncytial Virus by PCR NEGATIVE NEGATIVE Final    Comment: (NOTE) Fact Sheet for Patients: BloggerCourse.com  Fact Sheet for Healthcare Providers: SeriousBroker.it  This test is not yet approved or cleared by the [place] FDA and has been authorized for detection and/or diagnosis of SARS-CoV-2 by FDA under an Emergency Use Authorization (EUA). This EUA will remain in effect (meaning this test can be used) for the duration of the COVID-19 declaration under Section 564(b)(1) of the Act, 21 U.S.C. section 360bbb-3(b)(1), unless the authorization is terminated or revoked.  Performed at [facility], [address] [place], [place] [address]   Blood Culture (routine x 2)     Status:  None (Preliminary result)   Collection Time: 04/20/23  5:26 AM   Specimen: BLOOD  Result Value Ref Range Status   Specimen Description BLOOD  Final   Special Requests   Final    BOTTLES DRAWN AEROBIC AND ANAEROBIC Blood Culture adequate volume   Culture   Final    NO GROWTH 1 DAY Performed at [facility], [address] [place], [place] [address]    Report Status PENDING  Incomplete  Blood Culture (routine x 2)     Status: None (Preliminary result)   Collection Time: 04/20/23  5:26 AM   Specimen: BLOOD  Result Value Ref Range Status   Specimen Description BLOOD BLOOD LEFT ARM  Final   Special Requests   Final    BOTTLES DRAWN AEROBIC AND ANAEROBIC Blood Culture results may not be optimal due to an inadequate volume of blood received in culture bottles   Culture   Final    NO GROWTH 1 DAY Performed at [facility], [address] [place], [place] [address]    Report Status PENDING  Incomplete  Respiratory (~20 pathogens) panel by PCR     Status: None   Collection Time: 04/20/23  9:01 AM   Specimen: Nasopharyngeal Swab; Respiratory  Result Value Ref Range Status   Adenovirus NOT DETECTED NOT DETECTED Final   Coronavirus 229E NOT DETECTED NOT DETECTED Final    Comment: (NOTE) The Coronavirus on the Respiratory Panel, DOES NOT test for the novel  Coronavirus (2019 nCoV)    Coronavirus HKU1 NOT DETECTED NOT DETECTED Final   Coronavirus NL63 NOT DETECTED NOT DETECTED Final   Coronavirus OC43 NOT DETECTED NOT DETECTED Final   Metapneumovirus NOT DETECTED NOT DETECTED Final   Rhinovirus / Enterovirus NOT DETECTED NOT DETECTED Final   Influenza A NOT DETECTED NOT DETECTED Final   Influenza B NOT DETECTED NOT DETECTED Final   Parainfluenza Virus 1 NOT DETECTED NOT DETECTED Final   Parainfluenza Virus 2 NOT DETECTED NOT DETECTED Final   Parainfluenza Virus 3 NOT DETECTED NOT DETECTED Final   Parainfluenza Virus 4 NOT DETECTED NOT DETECTED Final   Respiratory  Syncytial Virus NOT DETECTED NOT DETECTED Final   Bordetella pertussis NOT DETECTED NOT DETECTED Final   Bordetella Parapertussis NOT DETECTED NOT DETECTED Final   Chlamydophila pneumoniae NOT DETECTED NOT DETECTED  Final   Mycoplasma pneumoniae NOT DETECTED NOT DETECTED Final    Comment: Performed at [facility], [address] [place], [place] [address]     Time coordinating discharge: Over 30 minutes  SIGNED:   Tresa Moore, MD  [facility] 04/21/2023, 2:16 PM Pager   If 7PM-7AM, please contact night-coverage

## 2023-04-22 ENCOUNTER — Inpatient Hospital Stay: Payer: MEDICAID

## 2023-04-22 ENCOUNTER — Telehealth: Payer: Self-pay | Admitting: *Deleted

## 2023-04-22 VITALS — BP 113/85 | HR 77 | Temp 97.9°F | Resp 16

## 2023-04-22 DIAGNOSIS — D509 Iron deficiency anemia, unspecified: Secondary | ICD-10-CM

## 2023-04-22 MED ORDER — IRON SUCROSE 20 MG/ML IV SOLN
200.0000 mg | Freq: Once | INTRAVENOUS | Status: AC
  Administered 2023-04-22: 200 mg via INTRAVENOUS
  Filled 2023-04-22: qty 10

## 2023-04-22 NOTE — Transitions of Care (Post Inpatient/ED Visit) (Signed)
   04/22/2023  Name: Richard Vasquez MRN: [id number] DOB: [birth year]  Today's TOC FU Call Status: Today's TOC FU Call Status:: Unsuccessful Call (1st Attempt) Unsuccessful Call (1st Attempt) Date: 04/22/23  Attempted to reach the patient regarding the most recent Inpatient/ED visit.  Follow Up Plan: Additional outreach attempts will be made to reach the patient to complete the Transitions of Care (Post Inpatient/ED visit) call.   Gean Maidens BSN RN [place] [facility] Health Care Management Coordinator [email or web address] Direct Dial: [phone]  Fax: [phone] Website: [email or web address]

## 2023-04-23 ENCOUNTER — Telehealth: Payer: Self-pay | Admitting: *Deleted

## 2023-04-23 NOTE — Transitions of Care (Post Inpatient/ED Visit) (Signed)
 04/23/2023  Name: Richard Vasquez MRN: [id number] DOB: 13-Jan-1961  Today's TOC FU Call Status: Today's TOC FU Call Status:: Successful TOC FU Call Completed TOC FU Call Complete Date: 04/23/23 Patient's Name and Date of Birth confirmed.  Transition Care Management Follow-up Telephone Call Date of Discharge: 04/21/23 Discharge Facility: [facility]) Type of Discharge: Inpatient Admission Primary Inpatient Discharge Diagnosis:: Sepsis due to pneumonia How have you been since you were released from the hospital?: Better Any questions or concerns?: No  Items Reviewed: Did you receive and understand the discharge instructions provided?: Yes Medications obtained,verified, and reconciled?: Yes (Medications Reviewed) Any new allergies since your discharge?: No Dietary orders reviewed?: No Do you have support at home?: Yes People in Home: spouse Name of Support/Comfort Primary Source: Boyd Kerbs  Medications Reviewed Today: Medications Reviewed Today     Reviewed by Luella Cook, RN (Case Manager) on 04/23/23 at 1609  Med List Status: <None>   Medication Order Taking? Sig Documenting Provider Last Dose Status Informant  albuterol (VENTOLIN HFA) 108 (90 Base) MCG/ACT inhaler [id number] Yes Inhale 2 puffs into the lungs every 6 (six) hours as needed for wheezing or shortness of breath. Sallee Provencal, FNP Taking Active Spouse/Significant Other  ferrous sulfate 325 (65 FE) MG EC tablet [id number] Yes Take 1 tablet (325 mg total) by mouth 3 (three) times a week. Michaelyn Barter, MD Taking Active Spouse/Significant Other           Med Note Shara Blazing Apr 20, 2023  4:37 AM) MWF  gabapentin (NEURONTIN) 300 MG capsule [id number] Yes Take 1 capsule (300 mg total) by mouth 3 (three) times daily. Jacky Kindle, FNP Taking Active Spouse/Significant Other  hydrOXYzine (VISTARIL) 25 MG capsule [id number] Yes Take 25 mg by mouth 4 (four) times daily as needed for  anxiety. [provider] Taking Active Spouse/Significant Other           Med Note Shara Blazing Apr 20, 2023  4:38 AM) Usually takes 1am + 1pm  OLANZapine (ZYPREXA) 10 MG tablet [id number] Yes Take 10mg  by mouth once daily  Patient taking differently: Take 10 mg by mouth at bedtime.   Tresa Moore, MD Taking Active Spouse/Significant Other  pantoprazole (PROTONIX) 40 MG tablet [id number] Yes Take 1 tablet (40 mg total) by mouth daily.  Patient taking differently: Take 40 mg by mouth daily as needed.   Sallee Provencal, FNP Taking Active Spouse/Significant Other  predniSONE (DELTASONE) 20 MG tablet [id number] Yes Take 1 tablet (20 mg total) by mouth daily with breakfast for 5 days. Tresa Moore, MD Taking Active   QUEtiapine (SEROQUEL) 200 MG tablet [id number] Yes Take 200 mg by mouth at bedtime. [provider] Taking Active Spouse/Significant Other  QUEtiapine (SEROQUEL) 50 MG tablet [id number] Yes Take 50 mg by mouth daily as needed. [provider] Taking Active Spouse/Significant Other  rosuvastatin (CRESTOR) 40 MG tablet [id number] Yes Take 1 tablet (40 mg total) by mouth daily. Jacky Kindle, FNP Taking Active Spouse/Significant Other  sertraline (ZOLOFT) 25 MG tablet [id number] Yes Take 25 mg by mouth daily. [provider] Taking Active Spouse/Significant Other  SUBOXONE 12-3 MG FILM [id number] Yes Place 0.33 Film under the tongue 2 (two) times daily with breakfast and lunch. 1/3 film under the tongue twice daily (cuts film) [provider] Taking Active Spouse/Significant Other            Home Care and  Equipment/Supplies: Were Home Health Services Ordered?: NA Any new equipment or medical supplies ordered?: NA  Functional Questionnaire: Do you need assistance with bathing/showering or dressing?: No Do you need assistance with meal preparation?: No Do you need assistance with eating?: No Do you have difficulty  maintaining continence: No Do you need assistance with getting out of bed/getting out of a chair/moving?: No Do you have difficulty managing or taking your medications?: No  Follow up appointments reviewed: PCP Follow-up appointment confirmed?: Yes Date of PCP follow-up appointment?: 04/24/22 Follow-up Provider: [facility] Follow-up appointment confirmed?: NA Do you need transportation to your follow-up appointment?: No Do you understand care options if your condition(s) worsen?: Yes-patient verbalized understanding  SDOH Interventions Today    Flowsheet Row Most Recent Value  SDOH Interventions   Food Insecurity Interventions Intervention Not Indicated  Housing Interventions Intervention Not Indicated  [lives in a rooming house]  Transportation Interventions Intervention Not Indicated  [Per patient he can ride his Moped]  Utilities Interventions Intervention Not Indicated      Interventions Today    Flowsheet Row Most Recent Value  Chronic Disease   Chronic disease during today's visit Other  [Sepsis due to pneumonia]  General Interventions   General Interventions Discussed/Reviewed General Interventions Discussed, General Interventions Reviewed, Doctor Visits  Doctor Visits Discussed/Reviewed Doctor Visits Discussed, Doctor Visits Reviewed, PCP  PCP/Specialist Visits Compliance with follow-up visit  Pharmacy Interventions   Pharmacy Dicussed/Reviewed Pharmacy Topics Discussed, Pharmacy Topics Reviewed        Patient declined TOC follow up outreach calls  Gean Maidens BSN RN [facility] Health Care Management Coordinator [email or web address] Direct Dial: [phone]  Fax: [phone] Website: [email or web address]

## 2023-04-24 ENCOUNTER — Ambulatory Visit: Payer: MEDICAID | Admitting: Physician Assistant

## 2023-04-24 ENCOUNTER — Encounter: Payer: Self-pay | Admitting: Physician Assistant

## 2023-04-24 VITALS — BP 123/72 | HR 70 | Temp 98.1°F | Resp 16 | Ht 70.0 in | Wt 184.1 lb

## 2023-04-24 DIAGNOSIS — D508 Other iron deficiency anemias: Secondary | ICD-10-CM

## 2023-04-24 DIAGNOSIS — R012 Other cardiac sounds: Secondary | ICD-10-CM

## 2023-04-24 DIAGNOSIS — J4520 Mild intermittent asthma, uncomplicated: Secondary | ICD-10-CM

## 2023-04-24 DIAGNOSIS — F17210 Nicotine dependence, cigarettes, uncomplicated: Secondary | ICD-10-CM

## 2023-04-24 DIAGNOSIS — J438 Other emphysema: Secondary | ICD-10-CM

## 2023-04-24 DIAGNOSIS — R5383 Other fatigue: Secondary | ICD-10-CM

## 2023-04-24 DIAGNOSIS — I10 Essential (primary) hypertension: Secondary | ICD-10-CM

## 2023-04-24 DIAGNOSIS — E785 Hyperlipidemia, unspecified: Secondary | ICD-10-CM

## 2023-04-24 DIAGNOSIS — I2510 Atherosclerotic heart disease of native coronary artery without angina pectoris: Secondary | ICD-10-CM

## 2023-04-24 MED ORDER — FLUTICASONE-SALMETEROL 100-50 MCG/ACT IN AEPB
1.0000 | INHALATION_SPRAY | Freq: Two times a day (BID) | RESPIRATORY_TRACT | 3 refills | Status: DC
Start: 2023-04-24 — End: 2023-05-26

## 2023-04-24 NOTE — Progress Notes (Unsigned)
 Established patient visit  Patient: Richard Vasquez   DOB: [birth year]   63 y.o. Male  MRN: [id number] Visit Date: 04/24/2023  Today's healthcare provider: Debera Lat, PA-C   Chief Complaint  Patient presents with   Hospitalization Follow-up    Hospital f/u  no other concerns   Subjective     HPI     Hospitalization Follow-up    Additional comments: Hospital f/u  no other concerns      Last edited by Liz Beach, CMA on 04/24/2023  3:31 PM.       Discussed the use of AI scribe software for clinical note transcription with the patient, who gave verbal consent to proceed.  History of Present Illness The patient, with a history of sepsis pneumonia and iron deficiency, presents for a follow-up visit after a recent hospitalization. The patient was admitted due to acute generalized weakness, dyspnea, and cough. The patient's wife reports that the patient's symptoms were thought to be related to low iron levels, as the patient's red blood cells and oxygen levels were low during the hospital stay. The patient was given prednisone during the hospital stay, which has helped improve the patient's condition.  The patient has been experiencing fatigue, which is believed to be a normal symptom of low iron levels. The patient's iron levels have improved from six percent to nine percent after receiving iron infusions and starting iron pills. The patient's hemoglobin level has also increased to twelve point seven. The patient's wife reports that the patient's iron levels are still low, and the patient is scheduled to continue receiving iron infusions twice a week and taking iron pills.  The patient also has a history of a hernia. A scope was performed, but no bleeding was found. The patient's wife reports that the patient has not had any chest pain or shortness of breath since the hospital stay.       04/24/2023    3:35 PM 03/27/2023    1:23 PM 12/30/2022    2:07 PM  Depression screen PHQ  2/9  Decreased Interest 0 0 0  Down, Depressed, Hopeless 0 0 0  PHQ - 2 Score 0 0 0  Altered sleeping 0 0   Tired, decreased energy 2 2   Change in appetite 2 0   Feeling bad or failure about yourself  0 0   Trouble concentrating 0 0   Moving slowly or fidgety/restless 0 0   Suicidal thoughts 0 0   PHQ-9 Score 4 2   Difficult doing work/chores  Somewhat difficult       04/24/2023    3:36 PM 03/27/2023    1:23 PM 05/22/2022    2:22 PM  GAD 7 : Generalized Anxiety Score  Nervous, Anxious, on Edge 0 0 0  Control/stop worrying 0 0 0  Worry too much - different things 0 0 0  Trouble relaxing 0 0 0  Restless 0 0 0  Easily annoyed or irritable 0 0 0  Afraid - awful might happen 0 0 0  Total GAD 7 Score 0 0 0  Anxiety Difficulty Not difficult at all Not difficult at all     Medications: Outpatient Medications Prior to Visit  Medication Sig   albuterol (VENTOLIN HFA) 108 (90 Base) MCG/ACT inhaler Inhale 2 puffs into the lungs every 6 (six) hours as needed for wheezing or shortness of breath.   ferrous sulfate 325 (65 FE) MG EC tablet Take 1 tablet (325 mg total) by mouth 3 (  three) times a week.   gabapentin (NEURONTIN) 300 MG capsule Take 1 capsule (300 mg total) by mouth 3 (three) times daily.   hydrOXYzine (VISTARIL) 25 MG capsule Take 25 mg by mouth 4 (four) times daily as needed for anxiety.   OLANZapine (ZYPREXA) 10 MG tablet Take 10mg  by mouth once daily (Patient taking differently: Take 10 mg by mouth at bedtime.)   pantoprazole (PROTONIX) 40 MG tablet Take 1 tablet (40 mg total) by mouth daily. (Patient taking differently: Take 40 mg by mouth daily as needed.)   QUEtiapine (SEROQUEL) 200 MG tablet Take 200 mg by mouth at bedtime.   QUEtiapine (SEROQUEL) 50 MG tablet Take 50 mg by mouth daily as needed.   rosuvastatin (CRESTOR) 40 MG tablet Take 1 tablet (40 mg total) by mouth daily.   sertraline (ZOLOFT) 25 MG tablet Take 25 mg by mouth daily.   SUBOXONE 12-3 MG FILM Place  0.33 Film under the tongue 2 (two) times daily with breakfast and lunch. 1/3 film under the tongue twice daily (cuts film)   predniSONE (DELTASONE) 20 MG tablet Take 1 tablet (20 mg total) by mouth daily with breakfast for 5 days. (Patient not taking: Reported on 04/24/2023)   No facility-administered medications prior to visit.    Review of Systems All negative Except see HPI   {Insert previous labs (optional):23779} {See past labs  Heme  Chem  Endocrine  Serology  Results Review (optional):1}   Objective    BP 123/72 (BP Location: Right Arm, Patient Position: Sitting)   Pulse 70   Temp 98.1 F (36.7 C) (Oral)   Resp 16   Ht 5\' 10"  (1.778 m)   Wt 184 lb 1.6 oz (83.5 kg)   SpO2 97%   BMI 26.42 kg/m  {Insert last BP/Wt (optional):23777}{See vitals history (optional):1}   Physical Exam   No results found for any visits on 04/24/23.      Assessment and Plan Assessment & Plan Iron Deficiency Anemia Iron deficiency anemia contributed to recent hospitalization. Hemoglobin improved with treatment, but iron saturation remains low. No bleeding source identified. Further investigation planned if levels do not improve. - Continue iron infusions twice a week. - Continue oral iron supplements. - Follow up with Dr. Michae Kava. - Consider capsule endoscopy if iron levels do not improve.  Chronic Obstructive Pulmonary Disease COPD confirmed by imaging. Uses albuterol inhaler. Referral to pulmonology needed for further management. - Refer to pulmonology. - Prescribe additional inhaler medication for daily use.  Atherosclerotic Cardiovascular Disease Imaging shows atherosclerotic calcification. Left bundle branch block and sinus rhythm noted. Repeat EKG needed to monitor cardiac status. - Repeat EKG. - Consider referral to cardiology.  Smoking Cessation Smoking cessation crucial for COPD and cardiovascular health. Resources recommended. - Advise smoking cessation and provide  resources, including federal sites offering patches and gums.  Follow-up Ongoing monitoring required for anemia, COPD, and cardiovascular health. - Schedule follow-up in one month or sooner if symptoms worsen. - Monitor blood pressure and heart rate at home. - Ensure follow-up with pulmonology and cardiology as needed.    Orders Placed This Encounter  Procedures   Ambulatory referral to Pulmonology    Referral Priority:   Routine    Referral Type:   Consultation    Referral Reason:   Specialty Services Required    Requested Specialty:   Pulmonary Disease    Number of Visits Requested:   1    Return in about 4 weeks (around 05/22/2023) for chronic disease f/u.  The patient was advised to call back or seek an in-person evaluation if the symptoms worsen or if the condition fails to improve as anticipated.  I discussed the assessment and treatment plan with the patient. The patient was provided an opportunity to ask questions and all were answered. The patient agreed with the plan and demonstrated an understanding of the instructions.  I, Debera Lat, PA-C have reviewed all documentation for this visit. The documentation on 04/24/2023  for the exam, diagnosis, procedures, and orders are all accurate and complete.  Debera Lat, [facility], MMS [facility] [phone] (phone) [phone] (fax)  [facility]

## 2023-04-25 DIAGNOSIS — I2510 Atherosclerotic heart disease of native coronary artery without angina pectoris: Secondary | ICD-10-CM | POA: Insufficient documentation

## 2023-04-25 LAB — CULTURE, BLOOD (ROUTINE X 2)
Culture: NO GROWTH
Culture: NO GROWTH
Special Requests: ADEQUATE

## 2023-04-25 NOTE — Addendum Note (Signed)
 Addended by: Debera Lat on: 04/25/2023 12:08 PM   Modules accepted: Orders

## 2023-05-12 ENCOUNTER — Other Ambulatory Visit: Payer: Self-pay

## 2023-05-12 ENCOUNTER — Emergency Department
Admission: EM | Admit: 2023-05-12 | Discharge: 2023-05-12 | Disposition: A | Discharge status: Home / Self Care | Payer: MEDICAID | Attending: Emergency Medicine | Admitting: Emergency Medicine

## 2023-05-12 ENCOUNTER — Emergency Department: Payer: MEDICAID

## 2023-05-12 DIAGNOSIS — R42 Dizziness and giddiness: Secondary | ICD-10-CM | POA: Insufficient documentation

## 2023-05-12 LAB — BASIC METABOLIC PANEL WITH GFR
Anion gap: 9 (ref 5–15)
BUN: 7 mg/dL — ABNORMAL LOW (ref 8–23)
CO2: 25 mmol/L (ref 22–32)
Calcium: 9 mg/dL (ref 8.9–10.3)
Chloride: 104 mmol/L (ref 98–111)
Creatinine, Ser: 0.66 mg/dL (ref 0.61–1.24)
GFR, Estimated: >60 mL/min (ref >60)
Glucose, Bld: 112 mg/dL — ABNORMAL HIGH (ref 70–99)
Potassium: 4.4 mmol/L (ref 3.5–5.1)
Sodium: 138 mmol/L (ref 135–145)

## 2023-05-12 LAB — TROPONIN I (HIGH SENSITIVITY)
Troponin I (High Sensitivity): 4 ng/L (ref <18)
Troponin I (High Sensitivity): 6 ng/L (ref <18)

## 2023-05-12 LAB — CBC
HCT: 40 % (ref 39.0–52.0)
Hemoglobin: 13.6 g/dL (ref 13.0–17.0)
MCH: 31.7 pg (ref 26.0–34.0)
MCHC: 34 g/dL (ref 30.0–36.0)
MCV: 93.2 fL (ref 80.0–100.0)
Platelets: 142 10*3/uL — ABNORMAL LOW (ref 150–400)
RBC: 4.29 MIL/uL (ref 4.22–5.81)
RDW: 12.7 % (ref 11.5–15.5)
WBC: 7.9 10*3/uL (ref 4.0–10.5)
nRBC: 0 % (ref 0.0–0.2)

## 2023-05-12 MED ORDER — MECLIZINE HCL 25 MG PO TABS: 25.0000 mg | ORAL_TABLET | Freq: Three times a day (TID) | ORAL | 0 refills | Status: AC | PRN

## 2023-05-12 NOTE — ED Provider Notes (Signed)
 [facility] Provider Note    Event Date/Time   First MD Initiated Contact with Patient 05/12/23 1256     (approximate)   History   Dizziness and Chest Pain   HPI  Richard Vasquez is a 63 y.o. male presents to the emergency department following an episode of dizziness.  Patient states that he got up today and had a sudden onset of feeling like the room was spinning and feeling very dizzy.  Endorses nausea and then stated that he felt very off balance at that time.  Since that time states that he is feeling much better.  No longer with dizziness, headache, change in vision, slurring of speech, extremity numbness or weakness.  No chest pain or shortness of breath.  Able to ambulate without any difficulty since arriving to the ER.  Denies any change in hearing or ringing in his ears.  No recent falls or trauma.  No history of stroke.  No history of vertigo in the past.     Physical Exam   Triage Vital Signs: ED Triage Vitals  Encounter Vitals Group     BP 05/12/23 1012 (!) 137/90     Systolic BP Percentile --      Diastolic BP Percentile --      Pulse Rate 05/12/23 1012 88     Resp 05/12/23 1012 18     Temp 05/12/23 1012 98.4 F (36.9 C)     Temp Source 05/12/23 1012 Oral     SpO2 05/12/23 1012 93 %     Weight 05/12/23 1013 180 lb (81.6 kg)     Height 05/12/23 1013 5\' 10"  (1.778 m)     Head Circumference --      Peak Flow --      Pain Score --      Pain Loc --      Pain Education --      Exclude from Growth Chart --     Most recent vital signs: Vitals:   05/12/23 1012  BP: (!) 137/90  Pulse: 88  Resp: 18  Temp: 98.4 F (36.9 C)  SpO2: 93%    Physical Exam Constitutional:      Appearance: He is well-developed.  HENT:     Head: Atraumatic.  Eyes:     Conjunctiva/sclera: Conjunctivae normal.  Cardiovascular:     Rate and Rhythm: Regular rhythm.  Pulmonary:     Effort: No respiratory distress.  Musculoskeletal:     Cervical back:  Normal range of motion.  Skin:    General: Skin is warm.  Neurological:     Mental Status: He is alert. Mental status is at baseline.     GCS: GCS eye subscore is 4. GCS verbal subscore is 5. GCS motor subscore is 6.     Cranial Nerves: Cranial nerves 2-12 are intact.     Sensory: Sensation is intact.     Motor: Motor function is intact.     Coordination: Coordination is intact.     Gait: Gait is intact.     Comments: No dysmetria.  No nystagmus.  Able to ambulate without any difficulties.     IMPRESSION / MDM / ASSESSMENT AND PLAN / ED COURSE  I reviewed the triage vital signs and the nursing notes.  Differential diagnosis including peripheral vertigo, intracranial hemorrhage, ACS, anemia, pneumonia  EKG  I, Corena Herter, the attending physician, personally viewed and interpreted this ECG.  Underlying left bundle branch block.  Negative Sgarbossa's  criteria.  No significant change when compared to prior EKG.  No significant ST elevation or depression.  No findings of acute ischemia or dysrhythmia.  No tachycardic or bradycardic dysrhythmias while on cardiac telemetry.  RADIOLOGY I independently reviewed imaging, my interpretation of imaging: CT scan of the head with no signs of intracranial hemorrhage  Chest x-ray no signs of pneumonia  LABS (all labs ordered are listed, but only abnormal results are displayed) Labs interpreted as -    Labs Reviewed  BASIC METABOLIC PANEL WITH GFR - Abnormal; Notable for the following components:      Result Value   Glucose, Bld 112 (*)    BUN 7 (*)    All other components within normal limits  CBC - Abnormal; Notable for the following components:   Platelets 142 (*)    All other components within normal limits  URINALYSIS, ROUTINE W REFLEX MICROSCOPIC  CBG MONITORING, ED  TROPONIN I (HIGH SENSITIVITY)  TROPONIN I (HIGH SENSITIVITY)     MDM  Serial troponins are negative, no chest pain at this time and have low suspicion for  ACS.  Creatinine at baseline with no significant electrolyte abnormality.  Patient able to ambulate without any difficulties.  Has a nonfocal neurologic exam and have low suspicion for acute CVA.  No signs of intracranial hemorrhage.  Possible episode of vertigo.  Patient states he is feeling much better.  Will follow-up closely with his primary care physician.  Will do a course of meclizine.  Discussed return to the emergency department if his symptoms return.     PROCEDURES:  Critical Care performed: No  Procedures  Patient's presentation is most consistent with acute presentation with potential threat to life or bodily function.   MEDICATIONS ORDERED IN ED: Medications - No data to display  FINAL CLINICAL IMPRESSION(S) / ED DIAGNOSES   Final diagnoses:  Dizziness     Rx / DC Orders   ED Discharge Orders          Ordered    meclizine (ANTIVERT) 25 MG tablet  3 times daily PRN        05/12/23 1511             Note:  This document was prepared using Dragon voice recognition software and may include unintentional dictation errors.   Viviano Ground, MD 05/12/23 1515

## 2023-05-12 NOTE — ED Triage Notes (Signed)
 Pt c/o of dizziness that started this morning when he woke up. Pt denies nausea. Pt denies pain, pt denies blood thinner. Pt is A&Ox4. NAD noted.

## 2023-05-12 NOTE — ED Notes (Signed)
AVS with prescriptions provided to and discussed with patient and family member at bedside. Pt verbalizes understanding of discharge instructions and denies any questions or concerns at this time. Pt has ride home. Pt ambulated out of department independently with steady gait.  

## 2023-05-12 NOTE — Discharge Instructions (Signed)
 You are seen in the emergency department after an episode of dizziness.  Your CT scan of your head that was normal.  You do chest x-ray that did not show any signs of pneumonia.  You have 2 heart enzymes that were normal.  Your lab work was normal including a normal hemoglobin level.  Your dizziness resolved and you were able to ambulate without any difficulties in the emergency department.  Closely with your primary care physician for further follow-up, call tomorrow to schedule close appointment.  Return to the emergency department if you have return of symptoms.  You are given a prescription for meclizine which is a medication you can take for dizziness if your vertigo returns.  If your symptoms return and continue and are severe return to the emergency department.

## 2023-05-13 ENCOUNTER — Other Ambulatory Visit: Payer: Self-pay | Admitting: Internal Medicine

## 2023-05-14 ENCOUNTER — Encounter: Payer: Self-pay | Admitting: Internal Medicine

## 2023-05-26 ENCOUNTER — Encounter: Payer: Self-pay | Admitting: Pulmonary Disease

## 2023-05-26 ENCOUNTER — Ambulatory Visit: Payer: MEDICAID | Admitting: Pulmonary Disease

## 2023-05-26 VITALS — BP 118/72 | HR 66 | Temp 97.1°F | Ht 70.0 in | Wt 179.8 lb

## 2023-05-26 DIAGNOSIS — R0602 Shortness of breath: Secondary | ICD-10-CM | POA: Diagnosis not present

## 2023-05-26 MED ORDER — TRELEGY ELLIPTA 100-62.5-25 MCG/ACT IN AEPB
1.0000 | INHALATION_SPRAY | Freq: Every day | RESPIRATORY_TRACT | 3 refills | Status: DC
Start: 2023-05-26 — End: 2023-08-13

## 2023-05-26 MED ORDER — TRELEGY ELLIPTA 100-62.5-25 MCG/ACT IN AEPB: 1.0000 | INHALATION_SPRAY | Freq: Every day | RESPIRATORY_TRACT | 0 refills | Status: DC

## 2023-05-26 NOTE — Progress Notes (Unsigned)
 Synopsis: Referred in by Ostwalt, Janna, PA-C   Subjective:   PATIENT ID: Richard Vasquez GENDER: male DOB: Oct 01, 1960, MRN: [id number]  Chief Complaint  Patient presents with   Consult    ER for Pneumonia 04/19/2023. No SOB, wheezing or cough.     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  He was recently admitted to [facility] in March 23 to March 25 with pneumonia and COPD exacerbation.  He was discharged on albuterol  as needed and started on Advair shortly after that.  He reports years of ongoing shortness of breath on exertion but recently got significantly worse specifically going up the stairs.  Feels like chest tightness and wheezing.  However no symptoms at rest.  He does report an ongoing cough with clear phlegm.  CT chest 04/20/23 -centrilobular and paraseptal emphysema.  No nodules or suspicious  FH - No family history of pulmonmary dieases  SH - Stopped smoking 2 weeks ago but smoked 1 to 2 packs per day for the last 40 years.    ROS All systems were reviewed and are negative except for the above.  Objective:   Vitals:   05/26/23 1330  BP: 118/72  Pulse: 66  Temp: (!) 97.1 F (36.2 C)  SpO2: 96%  Weight: 179 lb 12.8 oz (81.6 kg)  Height: 5\' 10"  (1.778 m)   96% on RA BMI Readings from Last 3 Encounters:  05/26/23 25.80 kg/m  05/12/23 25.83 kg/m  04/24/23 26.42 kg/m   Wt Readings from Last 3 Encounters:  05/26/23 179 lb 12.8 oz (81.6 kg)  05/12/23 180 lb (81.6 kg)  04/24/23 184 lb 1.6 oz (83.5 kg)    Physical Exam GEN: NAD HEENT: Supple Neck, Reactive Pupils, EOMI  CVS: Normal S1, Normal S2, RRR, No murmurs or ES appreciated  Lungs: Poor expiratory efforts.  Abdomen: Soft, non tender, non distended, + BS  Extremities: Warm and well perfused, No edema   Skin: No suspicious lesions appreciated  Psych: Normal Affect  Ancillary Information   CBC    Component Value Date/Time   WBC 7.9 05/12/2023 1015   RBC 4.29 05/12/2023 1015   HGB 13.6 05/12/2023 1015   HGB 12.5 (L) 12/24/2022 1444   HCT 40.0 05/12/2023 1015   HCT 37.0 (L) 12/24/2022 1444   PLT 142 (L) 05/12/2023 1015   PLT 227 12/24/2022 1444   MCV 93.2 05/12/2023 1015   MCV 91 12/24/2022 1444   MCH 31.7 05/12/2023 1015   MCHC 34.0 05/12/2023 1015   RDW 12.7 05/12/2023 1015   RDW 12.6 12/24/2022 1444   LYMPHSABS 1.7 03/31/2023 1502   LYMPHSABS 2.3 12/24/2022 1444   MONOABS 0.8 03/31/2023 1502   EOSABS 0.4 03/31/2023 1502   EOSABS 0.4 12/24/2022 1444   BASOSABS 0.1 03/31/2023 1502   BASOSABS 0.1 12/24/2022 1444        No data to display           Assessment & Plan:  Mr. Chargualaf is a pleasant 63 year old male patient with a past medical history of hyperlipidemia, hypertension, schizophrenia history of alcohol use tobacco use and illicit drug use presenting today to the pulmonary clinic to establish care.  #Shrotness of breath  Exertional. Has been progressively worsening for years. Active smoker. This is consistent with COPD. No signs of heart failure.   []   PFTs  []  Start Fluticasone -Vilanterol-Umeclidinium [ Trelegy Ellipta] 100 1puff daily. Rinse mouth after each use.  []  Will enroll in lung cancer screening program during the last visit.   Return in about 3 months (around 08/25/2023).  I spent 60 minutes caring for this patient today, including preparing to see the patient, obtaining a medical history , reviewing a separately obtained history, performing a medically appropriate examination and/or evaluation, counseling and educating the patient/family/caregiver, ordering medications, tests, or procedures, documenting clinical information in the electronic health record, and independently interpreting results (not separately reported/billed) and communicating results  to the patient/family/caregiver  Annitta Kindler, MD [facility] 05/27/2023 1:35 PM

## 2023-06-04 ENCOUNTER — Telehealth: Payer: Self-pay | Admitting: *Deleted

## 2023-06-04 NOTE — Telephone Encounter (Signed)
 I called to the house and the wife was telling me that they put in ferrous sulfate  325 mg and take 1 tablet 3 days a week.   Wife says that when she went to the pharmacy they would need to pay $50.  He says that is a lot for 1 month.  It looks like in the computer that it is a 90-day supply for $50.  They said that is okay then.  I then turned around and called back but I did not get them to answer but they can also get the over-the-counter iron  of 65 mg and you only have to pay like $10 for a 90-day supply.  If she wants to call me back I would give her the places that I can find up in the computer.

## 2023-08-12 ENCOUNTER — Other Ambulatory Visit: Payer: Self-pay | Admitting: Family Medicine

## 2023-08-12 DIAGNOSIS — K2100 Gastro-esophageal reflux disease with esophagitis, without bleeding: Secondary | ICD-10-CM

## 2023-08-13 ENCOUNTER — Ambulatory Visit (INDEPENDENT_AMBULATORY_CARE_PROVIDER_SITE_OTHER): Payer: MEDICAID | Admitting: Pulmonary Disease

## 2023-08-13 ENCOUNTER — Encounter: Payer: Self-pay | Admitting: Pulmonary Disease

## 2023-08-13 VITALS — BP 136/80 | HR 74 | Temp 98.5°F | Ht 70.0 in | Wt 181.4 lb

## 2023-08-13 DIAGNOSIS — R0602 Shortness of breath: Secondary | ICD-10-CM

## 2023-08-13 DIAGNOSIS — J4489 Other specified chronic obstructive pulmonary disease: Secondary | ICD-10-CM

## 2023-08-13 DIAGNOSIS — J439 Emphysema, unspecified: Secondary | ICD-10-CM

## 2023-08-13 DIAGNOSIS — F17210 Nicotine dependence, cigarettes, uncomplicated: Secondary | ICD-10-CM | POA: Diagnosis not present

## 2023-08-13 LAB — PULMONARY FUNCTION TEST
DL/VA % pred: 69 %
DL/VA: 2.89 ml/min/mmHg/L
DLCO unc % pred: 86 %
DLCO unc: 23.44 ml/min/mmHg
FEF 25-75 Pre: 1.22 L/s
FEF2575-%Pred-Pre: 42 %
FEV1-%Pred-Pre: 68 %
FEV1-Pre: 2.42 L
FEV1FVC-%Pred-Pre: 72 %
FEV6-%Pred-Pre: 95 %
FEV6-Pre: 4.29 L
FEV6FVC-%Pred-Pre: 101 %
FVC-%Pred-Pre: 94 %
FVC-Pre: 4.45 L
Pre FEV1/FVC ratio: 55 %
Pre FEV6/FVC Ratio: 97 %

## 2023-08-13 MED ORDER — TRELEGY ELLIPTA 200-62.5-25 MCG/ACT IN AEPB: 1.0000 | INHALATION_SPRAY | Freq: Every day | RESPIRATORY_TRACT | 6 refills | Status: AC

## 2023-08-13 MED ORDER — TRELEGY ELLIPTA 200-62.5-25 MCG/ACT IN AEPB: 1.0000 | INHALATION_SPRAY | Freq: Every day | RESPIRATORY_TRACT | Status: DC

## 2023-08-13 NOTE — Patient Instructions (Signed)
 Full PFT completed today without post spirometry and pleth test.

## 2023-08-13 NOTE — Progress Notes (Signed)
 Synopsis: Referred in by Richard Domino, MD   Subjective:   PATIENT ID: Richard Vasquez GENDER: male DOB: 1960/10/11, MRN: [id number]  Chief Complaint  Patient presents with   Follow-up    Unable to complete PFT.    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  He was recently admitted to [facility] in March 23 to March 25 with pneumonia and COPD exacerbation.  He was discharged on albuterol  as needed and started on Advair shortly after that.  He reports years of ongoing shortness of breath on exertion but recently got significantly worse specifically going up the stairs.  Feels like chest tightness and wheezing.  However no symptoms at rest.  He does report an ongoing cough with clear phlegm.  CT chest 04/20/23 -centrilobular and paraseptal emphysema.  No nodules or suspicious  FH - No family history of pulmonmary dieases  SH - Stopped smoking 2 weeks ago but smoked 1 to 2 packs per day for the last 40 years.    OV 07.16.2025 - Richard Vasquez is here to follow up on his PFT results. The latter are interpreted with caution as it did not meet ATS criteria but it did Show Stage II COPD with FEV-1 68% of prediced. Post bronchodilators was not obtained. Lung volumes were not able to be performed. He did report a diagnosis of asthma as a child and there might be a component of asthma overlap. He had a good response tiwht Trelegy 100 however I am inclined to increase that to 200. We also discussed referral to lung cancer screening program and he is agreeable.   ROS All systems were reviewed and are negative except for the above.  Objective:   Vitals:   08/13/23 1528  BP: 136/80  Pulse: 74  Temp: 98.5 F (36.9 C)  TempSrc: Oral  SpO2: 94%  Weight: 181 lb 6.4 oz (82.3 kg)  Height: 5' 10  (1.778 m)   94% on RA BMI Readings from Last 3 Encounters:  08/13/23 26.03 kg/m  08/13/23 26.03 kg/m  05/26/23 25.80 kg/m   Wt Readings from Last 3 Encounters:  08/13/23 181 lb 6.4 oz (82.3 kg)  08/13/23 181 lb 6.4 oz (82.3 kg)  05/26/23 179 lb 12.8 oz (81.6 kg)    Physical Exam GEN: NAD HEENT: Supple Neck, Reactive Pupils, EOMI  CVS: Normal S1, Normal S2, RRR, No murmurs or ES appreciated  Lungs: Poor expiratory efforts.  Abdomen: Soft, non tender, non distended, + BS  Extremities: Warm and well perfused, No edema  Skin: No suspicious lesions appreciated  Psych: Normal Affect  Ancillary Information   CBC    Component Value Date/Time   WBC 7.9 05/12/2023 1015   RBC 4.29 05/12/2023 1015   HGB 13.6 05/12/2023 1015   HGB 12.5 (L) 12/24/2022 1444   HCT 40.0 05/12/2023 1015   HCT 37.0 (L) 12/24/2022 1444   PLT 142 (L) 05/12/2023 1015   PLT 227 12/24/2022 1444   MCV 93.2 05/12/2023 1015   MCV 91 12/24/2022 1444   MCH 31.7 05/12/2023 1015   MCHC 34.0 05/12/2023 1015   RDW 12.7 05/12/2023 1015   RDW 12.6 12/24/2022 1444   LYMPHSABS 1.7 03/31/2023 1502   LYMPHSABS 2.3 12/24/2022 1444   MONOABS 0.8 03/31/2023 1502   EOSABS 0.4 03/31/2023 1502   EOSABS 0.4  12/24/2022 1444   BASOSABS 0.1 03/31/2023 1502   BASOSABS 0.1 12/24/2022 1444       Latest Ref Rng & Units 08/13/2023    2:30 PM  PFT Results  FVC-Pre L 4.45  P  FVC-Predicted Pre % 94  P  Pre FEV1/FVC % % 55  P  FEV1-Pre L 2.42  P  FEV1-Predicted Pre % 68  P  DLCO uncorrected ml/min/mmHg 23.44  P  DLCO UNC% % 86  P  DLVA Predicted % 69  P    P Preliminary result     Assessment & Plan:  Richard Vasquez is a pleasant 63 year old male patient with a past medical history of hyperlipidemia, hypertension, schizophrenia history of alcohol use tobacco use and illicit drug use presenting today to the pulmonary clinic to establish care.  #COPD Stage II gp B #Asthma overlap #Tobacco use (Quit in April 2025)  []   Increase Fluticasone -Vilanterol-Umeclidinium [ Trelegy Ellipta ] 100 to 200 1puff daily. Rinse mouth after each use.  []  enroll in lung cancer screening program.  RTC 6 months.   I spent 30 minutes caring for this patient today, including preparing to see the patient, obtaining a medical history , reviewing a separately obtained history, performing a medically appropriate examination and/or evaluation, counseling and educating the patient/family/caregiver, ordering medications, tests, or procedures, documenting clinical information in the electronic health record, and independently interpreting results (not separately reported/billed) and communicating results to the patient/family/caregiver  Richard Barn, MD [facility] 08/13/2023 3:36 PM

## 2023-08-13 NOTE — Progress Notes (Signed)
 Full PFT completed today without post spirometry and pleth test. Pt couldnt meet ATS with FVC and couldnt get a good seal on mouthpiece for pleth.

## 2023-08-14 ENCOUNTER — Telehealth: Payer: Self-pay

## 2023-08-14 ENCOUNTER — Other Ambulatory Visit (HOSPITAL_COMMUNITY): Payer: Self-pay

## 2023-08-14 ENCOUNTER — Encounter: Payer: Self-pay | Admitting: Internal Medicine

## 2023-08-14 NOTE — Telephone Encounter (Signed)
*  Pulm  Pharmacy Patient Advocate Encounter   Received notification from CoverMyMeds that prior authorization for Trelegy Ellipta  200-62.5-25MCG/ACT aerosol powder  is required/requested.   Insurance verification completed.   The patient is insured through [employer] .   Per test claim: PA required; PA submitted to above mentioned insurance via CoverMyMeds Key/confirmation #/EOC [id number] Status is pending

## 2023-08-14 NOTE — Telephone Encounter (Signed)
 Pt was not with spouse at time of call to complete screening. Needs SDVM at 4pm or later.

## 2023-08-15 NOTE — Telephone Encounter (Signed)
 Your request has been approved Effective Date: 08/15/2023 Authorization Expiration Date: 08/14/2024

## 2023-10-01 ENCOUNTER — Ambulatory Visit: Payer: MEDICAID

## 2023-10-01 ENCOUNTER — Ambulatory Visit: Payer: MEDICAID | Admitting: Internal Medicine

## 2023-10-01 ENCOUNTER — Other Ambulatory Visit: Payer: MEDICAID

## 2023-10-06 ENCOUNTER — Other Ambulatory Visit: Payer: Self-pay | Admitting: *Deleted

## 2023-10-06 DIAGNOSIS — D509 Iron deficiency anemia, unspecified: Secondary | ICD-10-CM

## 2023-10-07 ENCOUNTER — Inpatient Hospital Stay: Payer: MEDICAID | Attending: Oncology

## 2023-10-07 ENCOUNTER — Inpatient Hospital Stay (HOSPITAL_BASED_OUTPATIENT_CLINIC_OR_DEPARTMENT_OTHER): Payer: MEDICAID | Admitting: Oncology

## 2023-10-07 ENCOUNTER — Inpatient Hospital Stay: Payer: MEDICAID

## 2023-10-07 ENCOUNTER — Encounter: Payer: Self-pay | Admitting: Oncology

## 2023-10-07 VITALS — BP 118/88 | HR 87 | Temp 97.3°F | Resp 18 | Ht 70.0 in | Wt 177.0 lb

## 2023-10-07 DIAGNOSIS — Z87891 Personal history of nicotine dependence: Secondary | ICD-10-CM | POA: Insufficient documentation

## 2023-10-07 DIAGNOSIS — Z79899 Other long term (current) drug therapy: Secondary | ICD-10-CM | POA: Diagnosis not present

## 2023-10-07 DIAGNOSIS — D509 Iron deficiency anemia, unspecified: Secondary | ICD-10-CM

## 2023-10-07 DIAGNOSIS — Z801 Family history of malignant neoplasm of trachea, bronchus and lung: Secondary | ICD-10-CM | POA: Diagnosis not present

## 2023-10-07 LAB — CBC WITH DIFFERENTIAL/PLATELET
Abs Immature Granulocytes: 0.03 K/uL (ref 0.00–0.07)
Basophils Absolute: 0.1 K/uL (ref 0.0–0.1)
Basophils Relative: 1 %
Eosinophils Absolute: 0.5 K/uL (ref 0.0–0.5)
Eosinophils Relative: 7 %
HCT: 36.8 % — ABNORMAL LOW (ref 39.0–52.0)
Hemoglobin: 12.9 g/dL — ABNORMAL LOW (ref 13.0–17.0)
Immature Granulocytes: 0 %
Lymphocytes Relative: 36 %
Lymphs Abs: 2.7 K/uL (ref 0.7–4.0)
MCH: 30.5 pg (ref 26.0–34.0)
MCHC: 35.1 g/dL (ref 30.0–36.0)
MCV: 87 fL (ref 80.0–100.0)
Monocytes Absolute: 0.5 K/uL (ref 0.1–1.0)
Monocytes Relative: 7 %
Neutro Abs: 3.5 K/uL (ref 1.7–7.7)
Neutrophils Relative %: 49 %
Platelets: 183 K/uL (ref 150–400)
RBC: 4.23 MIL/uL (ref 4.22–5.81)
RDW: 12.2 % (ref 11.5–15.5)
WBC: 7.3 K/uL (ref 4.0–10.5)
nRBC: 0 % (ref 0.0–0.2)

## 2023-10-07 LAB — IRON AND TIBC
Iron: 120 ug/dL (ref 45–182)
Saturation Ratios: 38 % (ref 17.9–39.5)
TIBC: 315 ug/dL (ref 250–450)
UIBC: 195 ug/dL

## 2023-10-07 LAB — VITAMIN B12: Vitamin B-12: 1047 pg/mL — ABNORMAL HIGH (ref 180–914)

## 2023-10-07 LAB — FERRITIN: Ferritin: 474 ng/mL — ABNORMAL HIGH (ref 24–336)

## 2023-10-07 NOTE — Progress Notes (Unsigned)
 Patient is feeling more fatigued and his legs are real itchy.

## 2023-10-08 ENCOUNTER — Encounter: Payer: Self-pay | Admitting: Internal Medicine

## 2023-10-08 NOTE — Progress Notes (Signed)
 [facility]  [phone] [phone]  ID: Richard Vasquez OB: 06/07/1960  MR#: [id number]  RDW#:[id number]  Patient Care Team: [name] LABOR, FNP as PCP - General (Family Medicine) Ermalinda Lenn [name], [place] as Social Worker Jacobo, Richard PARAS, MD as Consulting Physician (Oncology)  CHIEF COMPLAINT: Iron  deficiency anemia.  INTERVAL HISTORY: Patient returns to clinic today for repeat laboratory work, further evaluation, consideration of additional IV Venofer .  He complains of itchy legs, but otherwise feels well.  He has no neurologic complaints.  He denies any recent fevers or illnesses.  He has a good appetite and denies weight loss.  He does not complain of any weakness or fatigue today.  He has no chest pain, shortness of breath, cough, or hemoptysis.  He denies any nausea, vomiting, constipation, or diarrhea.  He has no urinary complaints.  Patient offers no further specific complaints today.  REVIEW OF SYSTEMS:   Review of Systems  Constitutional: Negative.  Negative for fever, malaise/fatigue and weight loss.  Respiratory: Negative.  Negative for cough, hemoptysis and shortness of breath.   Cardiovascular: Negative.  Negative for chest pain and leg swelling.  Gastrointestinal: Negative.  Negative for abdominal pain, blood in stool and melena.  Genitourinary: Negative.  Negative for dysuria.  Musculoskeletal: Negative.  Negative for back pain.  Skin:  Positive for itching. Negative for rash.  Neurological: Negative.  Negative for dizziness, focal weakness, weakness and headaches.  Psychiatric/Behavioral: Negative.  The patient is not nervous/anxious.     As per HPI. Otherwise, a complete review of systems is negative.  PAST MEDICAL HISTORY: Past Medical History:  Diagnosis Date   Anxiety    Arthritis    BPH (benign prostatic hyperplasia)    C2 cervical fracture (HCC) 06/12/2018   Chronic pain    Closed displaced supracondylar fracture  of distal end of right femur with intracondylar extension (HCC) 06/10/2018   Closed fracture of distal end of right femur (HCC) 06/09/2018   Closed left hip fracture, initial encounter (HCC) 08/18/2018   Depression    Hepatitis C    Paranoid schizophrenia (HCC)    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(HCC)    Sleep apnea     PAST SURGICAL HISTORY: Past Surgical History:  Procedure Laterality Date   BALLOON DILATION  03/04/2023   Procedure: BALLOON DILATION;  Surgeon: Jinny Carmine, MD;  Location: [facility] ENDOSCOPY;  Service: Endoscopy;;   BIOPSY  03/04/2023   Procedure: BIOPSY;  Surgeon: Jinny Carmine, MD;  Location: [facility] ENDOSCOPY;  Service: Endoscopy;;   COLONOSCOPY WITH PROPOFOL  N/A 05/18/2020   Procedure: COLONOSCOPY WITH PROPOFOL ;  Surgeon: Jinny Carmine, MD;  Location: [facility] ENDOSCOPY;  Service: Endoscopy;  Laterality: N/A;   ESOPHAGOGASTRODUODENOSCOPY (EGD) WITH PROPOFOL  N/A 03/04/2023   Procedure: ESOPHAGOGASTRODUODENOSCOPY (EGD) WITH PROPOFOL ;  Surgeon: Jinny Carmine, MD;  Location: [facility] ENDOSCOPY;  Service: Endoscopy;  Laterality: N/A;   FRACTURE SURGERY     HIP PINNING,CANNULATED Left 08/18/2018   Procedure: CANNULATED HIP PINNING, Right knee aspiration;  Surgeon: Marchia Drivers, MD;  Location: [facility] ORS;  Service: Orthopedics;  Laterality: Left;   JOINT REPLACEMENT     ORIF FEMUR FRACTURE Right 06/10/2018   Procedure: OPEN REDUCTION INTERNAL FIXATION (ORIF) DISTAL FEMUR FRACTURE;  Surgeon: Kendal Drivers SQUIBB, MD;  Location: [facility] OR;  Service: Orthopedics;  Laterality: Right;   TIBIA IM NAIL INSERTION Left 05/17/2021   Procedure: INTRAMEDULLARY NAILING OF LEFT TIBIA, STRESS EXAM OF PELVIS;  Surgeon: Kendal Drivers SQUIBB, MD;  Location: [facility] OR;  Service:  Orthopedics;  Laterality: Left;    FAMILY HISTORY: Family History  Problem Relation Age of Onset   Cancer Mother    Diabetes Mother    Diabetes Paternal Aunt    Lung cancer Paternal Uncle     ADVANCED DIRECTIVES (Y/N):  N  HEALTH  MAINTENANCE: Social History   Tobacco Use   Smoking status: Former    Current packs/day: 0.00    Average packs/day: 1 pack/day for 21.2 years (21.1 ttl pk-yrs)    Types: Cigarettes    Start date: 02/28/2001    Quit date: 05/19/2023    Years since quitting: 0.3    Passive exposure: Past   Smokeless tobacco: Never   Tobacco comments:    Started smoking at 63 years old    Smoked 2PPD at his heaviest.    Stopped smoking 1 year ago.  Vaping Use   Vaping status: Never Used  Substance Use Topics   Alcohol use: Not Currently   Drug use: Not Currently     Colonoscopy:  PAP:  Bone density:  Lipid panel:  No Known Allergies  Current Outpatient Medications  Medication Sig Dispense Refill   ADVAIR DISKUS 100-50 MCG/ACT AEPB Inhale 1 puff into the lungs 2 (two) times daily.     albuterol  (VENTOLIN  HFA) 108 (90 Base) MCG/ACT inhaler Inhale 2 puffs into the lungs every 6 (six) hours as needed for wheezing or shortness of breath. 8 g 2   Fluticasone -Umeclidin-Vilant (TRELEGY ELLIPTA ) 200-62.5-25 MCG/ACT AEPB Inhale 1 puff into the lungs daily. 3 each 6   Fluticasone -Umeclidin-Vilant (TRELEGY ELLIPTA ) 200-62.5-25 MCG/ACT AEPB Inhale 1 puff into the lungs daily.     gabapentin  (NEURONTIN ) 100 MG capsule Take 100 mg by mouth as needed.     gabapentin  (NEURONTIN ) 300 MG capsule Take 1 capsule (300 mg total) by mouth 3 (three) times daily. 270 capsule 3   hydrOXYzine  (VISTARIL ) 25 MG capsule Take 25 mg by mouth 4 (four) times daily as needed for anxiety.     meclizine  (ANTIVERT ) 25 MG tablet Take 1 tablet (25 mg total) by mouth 3 (three) times daily as needed for dizziness. 30 tablet 0   OLANZapine  (ZYPREXA ) 10 MG tablet Take 10mg  by mouth once daily (Patient taking differently: Take 10 mg by mouth at bedtime.) 30 tablet 0   pantoprazole  (PROTONIX ) 40 MG tablet TAKE 1 TABLET BY MOUTH EVERY DAY 90 tablet 0   QUEtiapine  (SEROQUEL ) 200 MG tablet Take 200 mg by mouth at bedtime.     QUEtiapine   (SEROQUEL ) 50 MG tablet Take 50 mg by mouth daily as needed.     rosuvastatin  (CRESTOR ) 40 MG tablet Take 1 tablet (40 mg total) by mouth daily. 90 tablet 3   sertraline  (ZOLOFT ) 25 MG tablet Take 25 mg by mouth daily.     SUBOXONE  12-3 MG FILM Place 0.33 Film under the tongue 2 (two) times daily with breakfast and lunch. 1/3 film under the tongue twice daily (cuts film)     No current facility-administered medications for this visit.    OBJECTIVE: Vitals:   10/07/23 1459 10/07/23 1501  BP: (!) 132/93 118/88  Pulse: 87   Resp: 18   Temp: (!) 97.3 F (36.3 C)   SpO2: 96%      Body mass index is 25.4 kg/m.    ECOG FS:0 - Asymptomatic  General: Well-developed, well-nourished, no acute distress. Eyes: Pink conjunctiva, anicteric sclera. HEENT: Normocephalic, moist mucous membranes. Lungs: No audible wheezing or coughing. Heart: Regular rate and rhythm.  Abdomen: Soft, nontender, no obvious distention. Musculoskeletal: No edema, cyanosis, or clubbing. Neuro: Alert, answering all questions appropriately. Cranial nerves grossly intact. Skin: No rashes or petechiae noted. Psych: Normal affect. Lymphatics: No cervical, calvicular, axillary or inguinal LAD.   LAB RESULTS:  Lab Results  Component Value Date   NA 138 05/12/2023   K 4.4 05/12/2023   CL 104 05/12/2023   CO2 25 05/12/2023   GLUCOSE 112 (H) 05/12/2023   BUN 7 (L) 05/12/2023   CREATININE 0.66 05/12/2023   CALCIUM  9.0 05/12/2023   PROT 6.8 04/20/2023   ALBUMIN 3.6 04/20/2023   AST 22 04/20/2023   ALT 15 04/20/2023   ALKPHOS 60 04/20/2023   BILITOT 0.7 04/20/2023   GFRNONAA >60 05/12/2023   GFRAA 119 03/20/2020    Lab Results  Component Value Date   WBC 7.3 10/07/2023   NEUTROABS 3.5 10/07/2023   HGB 12.9 (L) 10/07/2023   HCT 36.8 (L) 10/07/2023   MCV 87.0 10/07/2023   PLT 183 10/07/2023   Lab Results  Component Value Date   IRON  120 10/07/2023   TIBC 315 10/07/2023   IRONPCTSAT 38 10/07/2023   Lab  Results  Component Value Date   FERRITIN 474 (H) 10/07/2023     STUDIES: No results found.  ASSESSMENT: Iron  deficiency anemia.  PLAN:    Iron  deficiency anemia: Resolved.  Patient's hemoglobin is 12.9 and patient's iron  stores are within normal limits.  Colonoscopy in 2022 revealed multiple benign polyps, but no other significant pathology.  EGD in March 04, 2023 revealed a hiatal hernia and chronic gastritis.  He does not require additional IV Venofer  today.  Patient last received treatment on April 22, 2023.  He has been instructed to continue oral iron  supplementation.  No intervention is needed.  Return to clinic in 6 months for further evaluation by APP and consideration of IV Venofer . Elevated ferritin: Monitor.  I spent a total of 30 minutes reviewing chart data, face-to-face evaluation with the patient, counseling and coordination of care as detailed above.  Patient expressed understanding and was in agreement with this plan. He also understands that He can call clinic at any time with any questions, concerns, or complaints.    Richard JINNY Reusing, MD   10/08/2023 9:34 AM

## 2023-10-26 ENCOUNTER — Emergency Department
Admission: EM | Admit: 2023-10-26 | Discharge: 2023-10-26 | Disposition: A | Discharge status: Home / Self Care | Payer: MEDICAID | Attending: Emergency Medicine | Admitting: Emergency Medicine

## 2023-10-26 ENCOUNTER — Encounter: Payer: Self-pay | Admitting: Emergency Medicine

## 2023-10-26 ENCOUNTER — Other Ambulatory Visit: Payer: Self-pay

## 2023-10-26 DIAGNOSIS — I10 Essential (primary) hypertension: Secondary | ICD-10-CM | POA: Insufficient documentation

## 2023-10-26 DIAGNOSIS — T23151A Burn of first degree of right palm, initial encounter: Secondary | ICD-10-CM | POA: Diagnosis not present

## 2023-10-26 DIAGNOSIS — T310 Burns involving less than 10% of body surface: Secondary | ICD-10-CM | POA: Insufficient documentation

## 2023-10-26 DIAGNOSIS — X030XXA Exposure to flames in controlled fire, not in building or structure, initial encounter: Secondary | ICD-10-CM | POA: Insufficient documentation

## 2023-10-26 DIAGNOSIS — T300 Burn of unspecified body region, unspecified degree: Secondary | ICD-10-CM

## 2023-10-26 DIAGNOSIS — T23051A Burn of unspecified degree of right palm, initial encounter: Secondary | ICD-10-CM | POA: Diagnosis present
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
## 2023-10-26 NOTE — Discharge Instructions (Addendum)
 Keep the burn cool and dry.  Apply antibiotic ointment (like bacitracin , avoid Neosporin/neomycin) to any blisters that develop or open.  Use only mild soap and water to keep the wound clean.  Avoid alcohol and peroxide.  Follow-up with the [facility] if you develop any blisters to the hand or upper extremity.  Take OTC Tylenol  or Motrin  as needed.

## 2024-02-22 ENCOUNTER — Emergency Department: Payer: MEDICAID

## 2024-02-22 ENCOUNTER — Other Ambulatory Visit: Payer: Self-pay

## 2024-02-22 ENCOUNTER — Emergency Department
Admission: EM | Admit: 2024-02-22 | Discharge: 2024-02-22 | Disposition: A | Discharge status: Home / Self Care | Payer: MEDICAID | Source: Home / Self Care | Attending: Emergency Medicine | Admitting: Emergency Medicine

## 2024-02-22 DIAGNOSIS — R079 Chest pain, unspecified: Secondary | ICD-10-CM | POA: Insufficient documentation

## 2024-02-22 DIAGNOSIS — R0602 Shortness of breath: Secondary | ICD-10-CM | POA: Insufficient documentation

## 2024-02-22 DIAGNOSIS — R42 Dizziness and giddiness: Secondary | ICD-10-CM | POA: Insufficient documentation

## 2024-02-22 LAB — CBC
HCT: 38.5 % — ABNORMAL LOW (ref 39.0–52.0)
Hemoglobin: 12.8 g/dL — ABNORMAL LOW (ref 13.0–17.0)
MCH: 30.7 pg (ref 26.0–34.0)
MCHC: 33.2 g/dL (ref 30.0–36.0)
MCV: 92.3 fL (ref 80.0–100.0)
Platelets: 229 10*3/uL (ref 150–400)
RBC: 4.17 MIL/uL — ABNORMAL LOW (ref 4.22–5.81)
RDW: 12.5 % (ref 11.5–15.5)
WBC: 9.4 10*3/uL (ref 4.0–10.5)
nRBC: 0 % (ref 0.0–0.2)

## 2024-02-22 LAB — BASIC METABOLIC PANEL WITH GFR
Anion gap: 12 (ref 5–15)
BUN: 27 mg/dL — ABNORMAL HIGH (ref 8–23)
CO2: 25 mmol/L (ref 22–32)
Calcium: 9.8 mg/dL (ref 8.9–10.3)
Chloride: 104 mmol/L (ref 98–111)
Creatinine, Ser: 0.88 mg/dL (ref 0.61–1.24)
GFR, Estimated: >60 mL/min
Glucose, Bld: 96 mg/dL (ref 70–99)
Potassium: 4.7 mmol/L (ref 3.5–5.1)
Sodium: 140 mmol/L (ref 135–145)

## 2024-02-22 LAB — TROPONIN T, HIGH SENSITIVITY
Troponin T High Sensitivity: 23 ng/L — ABNORMAL HIGH (ref 0–19)
Troponin T High Sensitivity: 22 ng/L — ABNORMAL HIGH (ref 0–19)
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
## 2024-02-22 NOTE — ED Provider Notes (Signed)
 "  [facility] Provider Note    Event Date/Time   First MD Initiated Contact with Patient 02/22/24 [phone]     (approximate)   History   Chest Pain   HPI  Richard Vasquez is a 64 y.o. male presents to the emergency department today after an episode of dizziness and chest pain.  Symptoms started after the patient had woken up this morning.  He denies any unusual activity.  Says that he started feeling dizzy.  He then developed pain in the left side of his chest that radiated down his left arm.  The whole episode lasted about 30 minutes.  He did have some associated shortness of breath.  Denies any nausea or vomiting.  Denies similar symptoms in the past.  Denies history of heart disease.  He has not appreciated any new swelling in his extremities.  No new medications.     Physical Exam   Triage Vital Signs: ED Triage Vitals  Encounter Vitals Group     BP 02/22/24 0749 122/80     Girls Systolic BP Percentile --      Girls Diastolic BP Percentile --      Boys Systolic BP Percentile --      Boys Diastolic BP Percentile --      Pulse Rate 02/22/24 0755 73     Resp 02/22/24 0755 (!) 9     Temp 02/22/24 0755 (!) 97.5 F (36.4 C)     Temp src --      SpO2 02/22/24 0755 94 %     Weight 02/22/24 0751 160 lb 4.4 oz (72.7 kg)     Height 02/22/24 0751 5' 11 (1.803 m)     Head Circumference --      Peak Flow --      Pain Score 02/22/24 0751 0     Pain Loc --      Pain Education --      Exclude from Growth Chart --     Most recent vital signs: Vitals:   02/22/24 0749 02/22/24 0755  BP: 122/80   Pulse:  73  Resp:  (!) 9  Temp:  (!) 97.5 F (36.4 C)  SpO2:  94%   General: Awake, alert, oriented. CV:  Good peripheral perfusion. Regular rate and rhythm. Resp:  Normal effort. Lungs clear. Abd:  No distention.  Other:  No lower extremity edema.    ED Results / Procedures / Treatments   Labs (all labs ordered are listed, but only abnormal results are  displayed) Labs Reviewed  BASIC METABOLIC PANEL WITH GFR  CBC  TROPONIN T, HIGH SENSITIVITY     EKG  I, Guadalupe Eagles, attending physician, personally viewed and interpreted this EKG  EKG Time: 0754 Rate: 76 Rhythm: normal sinus rhythm Axis: normal Intervals: qtc 433 QRS: narrow, q waves v1, v2 ST changes: no st elevation Impression: abnormal ekg   RADIOLOGY I independently interpreted and visualized the CXR. My interpretation: No pneumonia. No pneumothorax. Radiology interpretation:  IMPRESSION:  1. No acute cardiopulmonary findings.  2. Subtle nodular opacity adjacent to the anterior right first rib  is new in the interval. CT chest without contrast recommended to  further evaluate.    I independently interpreted and visualized the CT chest. My interpretation: No pneumonia Radiology interpretation:  IMPRESSION:  1. No acute intrathoracic pathology.  2. A 3 mm subpleural nodule along the minor fissure. No follow-up  needed if patient is low-risk.This recommendation follows the  consensus  statement: Guidelines for Management of Incidental  Pulmonary Nodules Detected on CT Images: From the Fleischner Society  2017; Radiology 2017; 284:228-243.  3. Advanced 3 vessel coronary vascular calcification.  4. Aortic Atherosclerosis (ICD10-I70.0) and Emphysema (ICD10-J43.9).  Given the presence of pulmonary emphysema, an independent risk  factor for lung cancer, consider evaluating the patient for a  low-dose CT lung cancer screening program.     PROCEDURES:  Critical Care performed: No   MEDICATIONS ORDERED IN ED: Medications - No data to display   IMPRESSION / MDM / ASSESSMENT AND PLAN / ED COURSE  I reviewed the triage vital signs and the nursing notes.                              Differential diagnosis includes, but is not limited to, ACS, anemia, electrolyte abnormality  Patient's presentation is most consistent with acute presentation with potential  threat to life or bodily function.   Patient presented to the emergency department today because of concerns for an episode of dizziness and chest pain that had resolved by the time my exam.  Patient's EKG without concerning arrhythmia or ST elevation.  Initial troponin was very minimally elevated however stable on repeat.  Did obtain a chest x-ray and subsequent CT scan.  This showed pulmonary nodule however no concerning acute findings.  I discussed the finding of pulmonary nodule with the patient.  He had no further episodes while here in the emergency department.  Will plan on discharge him.      FINAL CLINICAL IMPRESSION(S) / ED DIAGNOSES   Final diagnoses:  Nonspecific chest pain  Dizziness     Note:  This document was prepared using Dragon voice recognition software and may include unintentional dictation errors.    Floy Roberts, MD 02/22/24 1108  "

## 2024-02-22 NOTE — ED Notes (Signed)
Pt ambulatory without assistance.  

## 2024-02-23 ENCOUNTER — Inpatient Hospital Stay
Admission: EM | Admit: 2024-02-23 | Discharge: 2024-02-26 | DRG: 558 | Disposition: A | Discharge status: Other Acute Inpatient Hospital | Payer: MEDICAID | Attending: Internal Medicine | Admitting: Internal Medicine

## 2024-02-23 ENCOUNTER — Other Ambulatory Visit: Payer: Self-pay

## 2024-02-23 DIAGNOSIS — R42 Dizziness and giddiness: Secondary | ICD-10-CM | POA: Diagnosis present

## 2024-02-23 DIAGNOSIS — F200 Paranoid schizophrenia: Secondary | ICD-10-CM | POA: Diagnosis present

## 2024-02-23 DIAGNOSIS — R451 Restlessness and agitation: Secondary | ICD-10-CM

## 2024-02-23 DIAGNOSIS — M6282 Rhabdomyolysis: Principal | ICD-10-CM | POA: Diagnosis present

## 2024-02-23 DIAGNOSIS — Z833 Family history of diabetes mellitus: Secondary | ICD-10-CM

## 2024-02-23 DIAGNOSIS — Z801 Family history of malignant neoplasm of trachea, bronchus and lung: Secondary | ICD-10-CM

## 2024-02-23 DIAGNOSIS — F1021 Alcohol dependence, in remission: Secondary | ICD-10-CM | POA: Diagnosis present

## 2024-02-23 DIAGNOSIS — S91111A Laceration without foreign body of right great toe without damage to nail, initial encounter: Secondary | ICD-10-CM | POA: Diagnosis present

## 2024-02-23 DIAGNOSIS — Z87891 Personal history of nicotine dependence: Secondary | ICD-10-CM

## 2024-02-23 DIAGNOSIS — F29 Unspecified psychosis not due to a substance or known physiological condition: Principal | ICD-10-CM | POA: Diagnosis present

## 2024-02-23 DIAGNOSIS — G8929 Other chronic pain: Secondary | ICD-10-CM | POA: Diagnosis present

## 2024-02-23 DIAGNOSIS — F209 Schizophrenia, unspecified: Secondary | ICD-10-CM | POA: Diagnosis present

## 2024-02-23 DIAGNOSIS — M549 Dorsalgia, unspecified: Secondary | ICD-10-CM | POA: Diagnosis present

## 2024-02-23 DIAGNOSIS — R4587 Impulsiveness: Secondary | ICD-10-CM | POA: Diagnosis present

## 2024-02-23 DIAGNOSIS — F32A Depression, unspecified: Secondary | ICD-10-CM | POA: Diagnosis present

## 2024-02-23 DIAGNOSIS — R0789 Other chest pain: Secondary | ICD-10-CM | POA: Diagnosis present

## 2024-02-23 DIAGNOSIS — D509 Iron deficiency anemia, unspecified: Secondary | ICD-10-CM | POA: Diagnosis present

## 2024-02-23 DIAGNOSIS — J449 Chronic obstructive pulmonary disease, unspecified: Secondary | ICD-10-CM | POA: Diagnosis present

## 2024-02-23 DIAGNOSIS — Z7951 Long term (current) use of inhaled steroids: Secondary | ICD-10-CM

## 2024-02-23 DIAGNOSIS — Z8619 Personal history of other infectious and parasitic diseases: Secondary | ICD-10-CM

## 2024-02-23 DIAGNOSIS — G2581 Restless legs syndrome: Secondary | ICD-10-CM | POA: Diagnosis present

## 2024-02-23 DIAGNOSIS — Z72820 Sleep deprivation: Secondary | ICD-10-CM

## 2024-02-23 DIAGNOSIS — R7401 Elevation of levels of liver transaminase levels: Secondary | ICD-10-CM | POA: Diagnosis present

## 2024-02-23 DIAGNOSIS — Z8673 Personal history of transient ischemic attack (TIA), and cerebral infarction without residual deficits: Secondary | ICD-10-CM

## 2024-02-23 DIAGNOSIS — X58XXXA Exposure to other specified factors, initial encounter: Secondary | ICD-10-CM | POA: Diagnosis present

## 2024-02-23 DIAGNOSIS — R4689 Other symptoms and signs involving appearance and behavior: Secondary | ICD-10-CM

## 2024-02-23 DIAGNOSIS — Z79899 Other long term (current) drug therapy: Secondary | ICD-10-CM

## 2024-02-23 DIAGNOSIS — E872 Acidosis, unspecified: Secondary | ICD-10-CM | POA: Diagnosis present

## 2024-02-23 DIAGNOSIS — E8729 Other acidosis: Secondary | ICD-10-CM

## 2024-02-23 DIAGNOSIS — N19 Unspecified kidney failure: Secondary | ICD-10-CM

## 2024-02-23 DIAGNOSIS — N400 Enlarged prostate without lower urinary tract symptoms: Secondary | ICD-10-CM | POA: Diagnosis present

## 2024-02-23 LAB — COMPREHENSIVE METABOLIC PANEL WITH GFR
ALT: 76 U/L — ABNORMAL HIGH (ref 0–44)
AST: 241 U/L — ABNORMAL HIGH (ref 15–41)
Albumin: 4.6 g/dL (ref 3.5–5.0)
Alkaline Phosphatase: 73 U/L (ref 38–126)
Anion gap: 17 — ABNORMAL HIGH (ref 5–15)
BUN: 35 mg/dL — ABNORMAL HIGH (ref 8–23)
CO2: 19 mmol/L — ABNORMAL LOW (ref 22–32)
Calcium: 10.2 mg/dL (ref 8.9–10.3)
Chloride: 101 mmol/L (ref 98–111)
Creatinine, Ser: 0.92 mg/dL (ref 0.61–1.24)
GFR, Estimated: >60 mL/min
Glucose, Bld: 109 mg/dL — ABNORMAL HIGH (ref 70–99)
Potassium: 4.2 mmol/L (ref 3.5–5.1)
Sodium: 137 mmol/L (ref 135–145)
Total Bilirubin: 0.4 mg/dL (ref 0.0–1.2)
Total Protein: 7.5 g/dL (ref 6.5–8.1)

## 2024-02-23 LAB — CBC
HCT: 35.4 % — ABNORMAL LOW (ref 39.0–52.0)
Hemoglobin: 12.2 g/dL — ABNORMAL LOW (ref 13.0–17.0)
MCH: 30.7 pg (ref 26.0–34.0)
MCHC: 34.5 g/dL (ref 30.0–36.0)
MCV: 89.2 fL (ref 80.0–100.0)
Platelets: 244 10*3/uL (ref 150–400)
RBC: 3.97 MIL/uL — ABNORMAL LOW (ref 4.22–5.81)
RDW: 12.5 % (ref 11.5–15.5)
WBC: 11.4 10*3/uL — ABNORMAL HIGH (ref 4.0–10.5)
nRBC: 0 % (ref 0.0–0.2)

## 2024-02-23 LAB — SALICYLATE LEVEL: Salicylate Lvl: <7 mg/dL — ABNORMAL LOW (ref 7.0–30.0)

## 2024-02-23 LAB — ACETAMINOPHEN LEVEL: Acetaminophen (Tylenol), Serum: <10 ug/mL — ABNORMAL LOW (ref 10–30)

## 2024-02-23 LAB — ETHANOL: Alcohol, Ethyl (B): <15 mg/dL

## 2024-02-23 MED ORDER — QUETIAPINE FUMARATE 25 MG PO TABS
50.0000 mg | ORAL_TABLET | Freq: Every morning | ORAL | Status: DC
  Administered 2024-02-24 – 2024-02-26 (×3): 50 mg via ORAL
  Filled 2024-02-23 (×3): qty 2

## 2024-02-23 MED ORDER — HYDROXYZINE PAMOATE 25 MG PO CAPS
25.0000 mg | ORAL_TABLET | Freq: Two times a day (BID) | ORAL | Status: DC | PRN
  Administered 2024-02-24 – 2024-02-26 (×2): 25 mg via ORAL
  Filled 2024-02-23 (×5): qty 1

## 2024-02-23 MED ORDER — BUPRENORPHINE HCL-NALOXONE HCL 8-2 MG SL SUBL: 1.0000 | SUBLINGUAL_TABLET | Freq: Two times a day (BID) | SUBLINGUAL | Status: DC

## 2024-02-23 MED ORDER — BUDESON-GLYCOPYRROL-FORMOTEROL 160-9-4.8 MCG/ACT IN AERO
2.0000 | INHALATION_SPRAY | Freq: Two times a day (BID) | RESPIRATORY_TRACT | Status: DC
  Administered 2024-02-24 – 2024-02-25 (×2): 2 via RESPIRATORY_TRACT
  Filled 2024-02-23: qty 5.9

## 2024-02-23 MED ORDER — BUPRENORPHINE HCL-NALOXONE HCL 2-0.5 MG SL SUBL: 2.0000 | SUBLINGUAL_TABLET | Freq: Two times a day (BID) | SUBLINGUAL | Status: DC

## 2024-02-23 MED ORDER — FERROUS SULFATE 325 (65 FE) MG PO TBEC
325.0000 mg | ORAL_TABLET | Freq: Every day | ORAL | Status: DC
  Administered 2024-02-24 – 2024-02-26 (×3): 325 mg via ORAL
  Filled 2024-02-23 (×3): qty 1

## 2024-02-23 MED ORDER — SERTRALINE HCL 50 MG PO TABS
25.0000 mg | ORAL_TABLET | Freq: Every day | ORAL | Status: DC
  Administered 2024-02-24 – 2024-02-26 (×3): 25 mg via ORAL
  Filled 2024-02-23 (×3): qty 1

## 2024-02-23 MED ORDER — GABAPENTIN 100 MG PO CAPS
100.0000 mg | ORAL_CAPSULE | Freq: Three times a day (TID) | ORAL | Status: DC
  Administered 2024-02-24 – 2024-02-26 (×6): 100 mg via ORAL
  Filled 2024-02-23 (×6): qty 1

## 2024-02-23 MED ORDER — ALBUTEROL SULFATE HFA 108 (90 BASE) MCG/ACT IN AERS
2.5000 mg | INHALATION_SPRAY | Freq: Four times a day (QID) | RESPIRATORY_TRACT | Status: DC | PRN
  Filled 2024-02-23: qty 3

## 2024-02-23 MED ORDER — QUETIAPINE FUMARATE 25 MG PO TABS
200.0000 mg | ORAL_TABLET | Freq: Every day | ORAL | Status: DC
  Administered 2024-02-24 – 2024-02-25 (×2): 200 mg via ORAL
  Filled 2024-02-23 (×2): qty 8

## 2024-02-23 MED ORDER — OLANZAPINE 10 MG PO TABS
10.0000 mg | ORAL_TABLET | Freq: Every day | ORAL | Status: DC
  Administered 2024-02-25: 10 mg via ORAL
  Filled 2024-02-23 (×4): qty 1

## 2024-02-23 NOTE — ED Notes (Addendum)
 Patient proceeded to get on all fours on the floor and howl like a wolf under the moon. Patient was easily redirectable back into bed. When inquiry was made into the reasoning behind his howling, he said, Just my anxiety. No acute needs at this time.
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
## 2024-02-23 NOTE — ED Notes (Signed)
 Black pants Brown belt [employer] underwear [employer]

## 2024-02-23 NOTE — ED Notes (Addendum)
 Wife called for an update, states that she is talking with the act team. Sees [facility] Dr. Chalice psychologist. Dr. Chalice will not be in the office until Wednesday. Wife states that patient is able to return home, that she does not feel like she is in danger physically. States that being on his hands and knees and howling like a wolf to relieve his anxiety. States that this is new for the patient. Wife states that the patient has had anxiety for the last 4 days and usually can talk the patient out of anxiety however, tonight she was not able to help alleviate the anxiety.
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
## 2024-02-23 NOTE — ED Notes (Signed)
 Patient noted to be banging the back of his hand on the floor as hard as he can, without injury. No acute needs at this time.

## 2024-02-24 DIAGNOSIS — G8929 Other chronic pain: Secondary | ICD-10-CM | POA: Diagnosis present

## 2024-02-24 DIAGNOSIS — M6282 Rhabdomyolysis: Secondary | ICD-10-CM | POA: Diagnosis present

## 2024-02-24 DIAGNOSIS — F29 Unspecified psychosis not due to a substance or known physiological condition: Secondary | ICD-10-CM | POA: Diagnosis not present

## 2024-02-24 DIAGNOSIS — Z79899 Other long term (current) drug therapy: Secondary | ICD-10-CM | POA: Diagnosis not present

## 2024-02-24 DIAGNOSIS — Z8673 Personal history of transient ischemic attack (TIA), and cerebral infarction without residual deficits: Secondary | ICD-10-CM | POA: Diagnosis not present

## 2024-02-24 DIAGNOSIS — E872 Acidosis, unspecified: Secondary | ICD-10-CM | POA: Diagnosis present

## 2024-02-24 DIAGNOSIS — J449 Chronic obstructive pulmonary disease, unspecified: Secondary | ICD-10-CM | POA: Diagnosis present

## 2024-02-24 DIAGNOSIS — Z7951 Long term (current) use of inhaled steroids: Secondary | ICD-10-CM | POA: Diagnosis not present

## 2024-02-24 DIAGNOSIS — Z8619 Personal history of other infectious and parasitic diseases: Secondary | ICD-10-CM | POA: Diagnosis not present

## 2024-02-24 DIAGNOSIS — Z87891 Personal history of nicotine dependence: Secondary | ICD-10-CM | POA: Diagnosis not present

## 2024-02-24 DIAGNOSIS — D509 Iron deficiency anemia, unspecified: Secondary | ICD-10-CM | POA: Diagnosis present

## 2024-02-24 DIAGNOSIS — F1021 Alcohol dependence, in remission: Secondary | ICD-10-CM | POA: Diagnosis present

## 2024-02-24 DIAGNOSIS — R42 Dizziness and giddiness: Secondary | ICD-10-CM | POA: Diagnosis present

## 2024-02-24 DIAGNOSIS — S91111A Laceration without foreign body of right great toe without damage to nail, initial encounter: Secondary | ICD-10-CM | POA: Diagnosis present

## 2024-02-24 DIAGNOSIS — R0789 Other chest pain: Secondary | ICD-10-CM | POA: Diagnosis present

## 2024-02-24 DIAGNOSIS — F200 Paranoid schizophrenia: Secondary | ICD-10-CM | POA: Diagnosis present

## 2024-02-24 DIAGNOSIS — R451 Restlessness and agitation: Secondary | ICD-10-CM | POA: Diagnosis present

## 2024-02-24 DIAGNOSIS — R4587 Impulsiveness: Secondary | ICD-10-CM | POA: Diagnosis present

## 2024-02-24 DIAGNOSIS — R7401 Elevation of levels of liver transaminase levels: Secondary | ICD-10-CM | POA: Diagnosis present

## 2024-02-24 DIAGNOSIS — Z833 Family history of diabetes mellitus: Secondary | ICD-10-CM | POA: Diagnosis not present

## 2024-02-24 DIAGNOSIS — M549 Dorsalgia, unspecified: Secondary | ICD-10-CM | POA: Diagnosis present

## 2024-02-24 DIAGNOSIS — G2581 Restless legs syndrome: Secondary | ICD-10-CM | POA: Diagnosis present

## 2024-02-24 DIAGNOSIS — F32A Depression, unspecified: Secondary | ICD-10-CM | POA: Diagnosis present

## 2024-02-24 DIAGNOSIS — Z72820 Sleep deprivation: Secondary | ICD-10-CM | POA: Diagnosis not present

## 2024-02-24 DIAGNOSIS — Z801 Family history of malignant neoplasm of trachea, bronchus and lung: Secondary | ICD-10-CM | POA: Diagnosis not present

## 2024-02-24 DIAGNOSIS — N400 Enlarged prostate without lower urinary tract symptoms: Secondary | ICD-10-CM | POA: Diagnosis present

## 2024-02-24 DIAGNOSIS — X58XXXA Exposure to other specified factors, initial encounter: Secondary | ICD-10-CM | POA: Diagnosis present

## 2024-02-24 LAB — COMPREHENSIVE METABOLIC PANEL WITH GFR
ALT: 53 U/L — ABNORMAL HIGH (ref 0–44)
AST: 155 U/L — ABNORMAL HIGH (ref 15–41)
Albumin: 3.6 g/dL (ref 3.5–5.0)
Alkaline Phosphatase: 54 U/L (ref 38–126)
Anion gap: 9 (ref 5–15)
BUN: 19 mg/dL (ref 8–23)
CO2: 23 mmol/L (ref 22–32)
Calcium: 8.6 mg/dL — ABNORMAL LOW (ref 8.9–10.3)
Chloride: 110 mmol/L (ref 98–111)
Creatinine, Ser: 0.62 mg/dL (ref 0.61–1.24)
GFR, Estimated: >60 mL/min
Glucose, Bld: 106 mg/dL — ABNORMAL HIGH (ref 70–99)
Potassium: 3.7 mmol/L (ref 3.5–5.1)
Sodium: 143 mmol/L (ref 135–145)
Total Bilirubin: 0.3 mg/dL (ref 0.0–1.2)
Total Protein: 5.6 g/dL — ABNORMAL LOW (ref 6.5–8.1)

## 2024-02-24 LAB — CBC
HCT: 28.9 % — ABNORMAL LOW (ref 39.0–52.0)
Hemoglobin: 9.9 g/dL — ABNORMAL LOW (ref 13.0–17.0)
MCH: 31.3 pg (ref 26.0–34.0)
MCHC: 34.3 g/dL (ref 30.0–36.0)
MCV: 91.5 fL (ref 80.0–100.0)
Platelets: 159 10*3/uL (ref 150–400)
RBC: 3.16 MIL/uL — ABNORMAL LOW (ref 4.22–5.81)
RDW: 12.7 % (ref 11.5–15.5)
WBC: 5.4 10*3/uL (ref 4.0–10.5)
nRBC: 0 % (ref 0.0–0.2)

## 2024-02-24 LAB — URINALYSIS, ROUTINE W REFLEX MICROSCOPIC
Bilirubin Urine: NEGATIVE
Glucose, UA: NEGATIVE mg/dL
Hgb urine dipstick: NEGATIVE
Ketones, ur: 80 mg/dL — AB
Leukocytes,Ua: NEGATIVE
Nitrite: NEGATIVE
Protein, ur: NEGATIVE mg/dL
Specific Gravity, Urine: 1.018 (ref 1.005–1.030)
pH: 5 (ref 5.0–8.0)

## 2024-02-24 LAB — CK
Total CK: 5155 U/L — ABNORMAL HIGH (ref 49–397)
Total CK: 2458 U/L — ABNORMAL HIGH (ref 49–397)

## 2024-02-24 LAB — URINE DRUG SCREEN
Amphetamines: NEGATIVE
Barbiturates: NEGATIVE
Benzodiazepines: POSITIVE — AB
Cocaine: NEGATIVE
Fentanyl: NEGATIVE
Methadone Scn, Ur: NEGATIVE
Opiates: NEGATIVE
Tetrahydrocannabinol: NEGATIVE

## 2024-02-24 MED ORDER — ENOXAPARIN SODIUM 40 MG/0.4ML IJ SOSY
40.0000 mg | PREFILLED_SYRINGE | INTRAMUSCULAR | Status: DC
  Administered 2024-02-24 – 2024-02-25 (×2): 40 mg via SUBCUTANEOUS
  Filled 2024-02-24 (×2): qty 0.4

## 2024-02-24 MED ORDER — ADULT MULTIVITAMIN W/MINERALS CH
1.0000 | ORAL_TABLET | Freq: Every day | ORAL | Status: DC
  Administered 2024-02-24 – 2024-02-26 (×3): 1 via ORAL
  Filled 2024-02-24 (×3): qty 1

## 2024-02-24 MED ORDER — THIAMINE HCL 100 MG/ML IJ SOLN
100.0000 mg | Freq: Every day | INTRAMUSCULAR | Status: DC
  Filled 2024-02-24 (×2): qty 2

## 2024-02-24 MED ORDER — THIAMINE MONONITRATE 100 MG PO TABS
100.0000 mg | ORAL_TABLET | Freq: Every day | ORAL | Status: DC
  Administered 2024-02-24 – 2024-02-26 (×3): 100 mg via ORAL
  Filled 2024-02-24 (×3): qty 1

## 2024-02-24 MED ORDER — ACETAMINOPHEN 500 MG PO TABS
1000.0000 mg | ORAL_TABLET | Freq: Four times a day (QID) | ORAL | Status: DC | PRN
  Administered 2024-02-24: 1000 mg via ORAL
  Filled 2024-02-24: qty 2

## 2024-02-24 MED ORDER — SODIUM CHLORIDE 0.9 % IV BOLUS (SEPSIS)
1000.0000 mL | Freq: Once | INTRAVENOUS | Status: AC
  Administered 2024-02-24: 1000 mL via INTRAVENOUS

## 2024-02-24 MED ORDER — OLANZAPINE 10 MG IM SOLR
5.0000 mg | Freq: Once | INTRAMUSCULAR | Status: AC
  Administered 2024-02-24: 5 mg via INTRAMUSCULAR
  Filled 2024-02-24: qty 10

## 2024-02-24 MED ORDER — BUPRENORPHINE HCL-NALOXONE HCL 8-2 MG SL SUBL
1.0000 | SUBLINGUAL_TABLET | Freq: Every day | SUBLINGUAL | Status: DC
  Administered 2024-02-24 – 2024-02-26 (×3): 1 via SUBLINGUAL
  Filled 2024-02-24 (×3): qty 1

## 2024-02-24 MED ORDER — THIAMINE HCL 100 MG/ML IJ SOLN
100.0000 mg | Freq: Once | INTRAMUSCULAR | Status: AC
  Administered 2024-02-24: 100 mg via INTRAVENOUS
  Filled 2024-02-24: qty 2

## 2024-02-24 MED ORDER — LORAZEPAM 1 MG PO TABS: 1.0000 mg | ORAL_TABLET | ORAL | Status: DC | PRN

## 2024-02-24 MED ORDER — BUPRENORPHINE HCL-NALOXONE HCL 2-0.5 MG SL SUBL
2.0000 | SUBLINGUAL_TABLET | Freq: Every day | SUBLINGUAL | Status: DC
  Administered 2024-02-24 – 2024-02-25 (×2): 2 via SUBLINGUAL
  Filled 2024-02-24 (×2): qty 2

## 2024-02-24 MED ORDER — PANTOPRAZOLE SODIUM 40 MG PO TBEC
40.0000 mg | DELAYED_RELEASE_TABLET | Freq: Every day | ORAL | Status: DC
  Administered 2024-02-24 – 2024-02-26 (×3): 40 mg via ORAL
  Filled 2024-02-24 (×3): qty 1

## 2024-02-24 MED ORDER — SODIUM CHLORIDE 0.9 % IV SOLN: INTRAVENOUS | Status: DC

## 2024-02-24 MED ORDER — FOLIC ACID 1 MG PO TABS
1.0000 mg | ORAL_TABLET | Freq: Every day | ORAL | Status: DC
  Administered 2024-02-24 – 2024-02-26 (×3): 1 mg via ORAL
  Filled 2024-02-24 (×3): qty 1

## 2024-02-24 MED ORDER — LORAZEPAM 2 MG/ML IJ SOLN
1.0000 mg | INTRAMUSCULAR | Status: DC | PRN
  Administered 2024-02-25: 2 mg via INTRAVENOUS
  Filled 2024-02-24: qty 1

## 2024-02-24 MED ORDER — LORAZEPAM 2 MG/ML IJ SOLN
1.0000 mg | Freq: Once | INTRAMUSCULAR | Status: AC
  Administered 2024-02-24: 1 mg via INTRAVENOUS
  Filled 2024-02-24: qty 1

## 2024-02-24 NOTE — Progress Notes (Signed)
"  ° °      CROSS COVER NOTE  NAME: Richard Vasquez MRN: [id number] DOB : [birth year] ATTENDING PHYSICIAN: Trudy Anthony [name], MD    Date of Service   02/24/2024   HPI/Events of Note   Subjective: Rapid response called for aggressive behavior. Security at bedside reporting patient combative since ED arrival. Night shift RN reports patient is impulsive and oscillates between being calm and getting out of bed, pushing staff out of the way, and swinging at staff. Staff report patient is refusing to take ordered PO meds. On my arrival patient in bed calm and apologetic reporting increased anxiety. Denies AVH, not responding to internal or external stimuli. Patient is tremulous with mild diaphoresis noted on forehead.  ROS  HPI:  Objective:  Vitals:   02/24/24 1247 02/24/24 1347 02/24/24 1520 02/24/24 1926  BP: 102/81  113/76 127/79  Pulse: 89  80 83  Resp: 17  18 20   Temp:  98.4 F (36.9 C) 98 F (36.7 C) 98.8 F (37.1 C)  TempSrc:  Oral Oral Oral  SpO2: 94%  96% 100%  Weight:      Height:        Physical Exam    Interventions   Assessment/Plan: Zyprexa  IM x1 Continue PO Seroquel  and Zyprexa  and PRN Atarax  Start CIWA protocol with as needed symptom triggered Ativan . HPI noted patient report that he is sober *** EKG in AM for qtc surveillance    *** professional thanks      To reach the provider On-Call:   7AM- 7PM see care teams to locate the attending and reach out to them via [email or web address] Password: [email or web address] 7PM-7AM contact night-coverage If you still have difficulty reaching the appropriate provider, please page the [facility] (Director on Call) for [facility] on amion for assistance  This document was prepared using Conservation officer, historic buildings and may include unintentional dictation errors.  Rockie Rams  FNP-BC, PMHNP-BC Nurse Practitioner [facility] [place]  "

## 2024-02-24 NOTE — ED Notes (Signed)
 Patient sleeping
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
## 2024-02-24 NOTE — ED Notes (Signed)
 IVC/pending psych consult
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
## 2024-02-24 NOTE — ED Notes (Signed)
 Meal provided
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
## 2024-02-24 NOTE — ED Notes (Addendum)
 Pt take to room with tech and security.

## 2024-02-24 NOTE — ED Notes (Signed)
 Given a diet tray, ate 100% of tray and drank a glass of water

## 2024-02-25 ENCOUNTER — Other Ambulatory Visit: Payer: Self-pay

## 2024-02-25 ENCOUNTER — Inpatient Hospital Stay: Payer: MEDICAID

## 2024-02-25 DIAGNOSIS — F29 Unspecified psychosis not due to a substance or known physiological condition: Secondary | ICD-10-CM | POA: Diagnosis not present

## 2024-02-25 DIAGNOSIS — F200 Paranoid schizophrenia: Secondary | ICD-10-CM

## 2024-02-25 DIAGNOSIS — R451 Restlessness and agitation: Secondary | ICD-10-CM | POA: Diagnosis not present

## 2024-02-25 DIAGNOSIS — M6282 Rhabdomyolysis: Secondary | ICD-10-CM | POA: Diagnosis not present

## 2024-02-25 LAB — CBC
HCT: 31.2 % — ABNORMAL LOW (ref 39.0–52.0)
Hemoglobin: 10.1 g/dL — ABNORMAL LOW (ref 13.0–17.0)
MCH: 31.2 pg (ref 26.0–34.0)
MCHC: 32.4 g/dL (ref 30.0–36.0)
MCV: 96.3 fL (ref 80.0–100.0)
Platelets: 153 10*3/uL (ref 150–400)
RBC: 3.24 MIL/uL — ABNORMAL LOW (ref 4.22–5.81)
RDW: 13.2 % (ref 11.5–15.5)
WBC: 5.9 10*3/uL (ref 4.0–10.5)
nRBC: 0 % (ref 0.0–0.2)

## 2024-02-25 LAB — COMPREHENSIVE METABOLIC PANEL WITH GFR
ALT: 50 U/L — ABNORMAL HIGH (ref 0–44)
AST: 107 U/L — ABNORMAL HIGH (ref 15–41)
Albumin: 3.5 g/dL (ref 3.5–5.0)
Alkaline Phosphatase: 56 U/L (ref 38–126)
Anion gap: 12 (ref 5–15)
BUN: 9 mg/dL (ref 8–23)
CO2: 22 mmol/L (ref 22–32)
Calcium: 8.6 mg/dL — ABNORMAL LOW (ref 8.9–10.3)
Chloride: 111 mmol/L (ref 98–111)
Creatinine, Ser: 0.54 mg/dL — ABNORMAL LOW (ref 0.61–1.24)
GFR, Estimated: >60 mL/min
Glucose, Bld: 75 mg/dL (ref 70–99)
Potassium: 3.6 mmol/L (ref 3.5–5.1)
Sodium: 145 mmol/L (ref 135–145)
Total Bilirubin: 0.3 mg/dL (ref 0.0–1.2)
Total Protein: 5.7 g/dL — ABNORMAL LOW (ref 6.5–8.1)

## 2024-02-25 LAB — CK: Total CK: 996 U/L — ABNORMAL HIGH (ref 49–397)

## 2024-02-25 LAB — AMMONIA: Ammonia: 34 umol/L (ref 9–35)

## 2024-02-25 LAB — IRON AND TIBC
Iron: 21 ug/dL — ABNORMAL LOW (ref 45–182)
Saturation Ratios: 9 % — ABNORMAL LOW (ref 17.9–39.5)
TIBC: 224 ug/dL — ABNORMAL LOW (ref 250–450)
UIBC: 203 ug/dL

## 2024-02-25 MED ORDER — LORAZEPAM 2 MG/ML IJ SOLN
2.0000 mg | Freq: Once | INTRAMUSCULAR | Status: AC
  Administered 2024-02-25: 2 mg via INTRAVENOUS
  Filled 2024-02-25: qty 1

## 2024-02-25 NOTE — Progress Notes (Signed)
 Pt exhibited agitation with verbal and physical aggression and was non-compliant with care. Hospital security and the provider were notified and present at the bedside. PRN medications were administered with effective results. After a short period, pt became calm and more cooperative with care. 1:1 safety sitter remains at bedside. Will continue to monitor.

## 2024-02-25 NOTE — Plan of Care (Signed)
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
## 2024-02-26 ENCOUNTER — Encounter: Payer: Self-pay | Admitting: Psychiatry

## 2024-02-26 ENCOUNTER — Inpatient Hospital Stay
Admission: AD | Admit: 2024-02-26 | Discharge: 2024-03-02 | DRG: 885 | Disposition: A | Payer: Self-pay | Attending: Psychiatry | Admitting: Psychiatry

## 2024-02-26 ENCOUNTER — Other Ambulatory Visit: Payer: Self-pay

## 2024-02-26 DIAGNOSIS — M6282 Rhabdomyolysis: Secondary | ICD-10-CM | POA: Diagnosis present

## 2024-02-26 DIAGNOSIS — Z87891 Personal history of nicotine dependence: Secondary | ICD-10-CM | POA: Diagnosis not present

## 2024-02-26 DIAGNOSIS — F200 Paranoid schizophrenia: Secondary | ICD-10-CM | POA: Diagnosis present

## 2024-02-26 DIAGNOSIS — M1990 Unspecified osteoarthritis, unspecified site: Secondary | ICD-10-CM | POA: Diagnosis present

## 2024-02-26 DIAGNOSIS — Z555 Less than a high school diploma: Secondary | ICD-10-CM | POA: Diagnosis not present

## 2024-02-26 DIAGNOSIS — Z7951 Long term (current) use of inhaled steroids: Secondary | ICD-10-CM

## 2024-02-26 DIAGNOSIS — G4730 Sleep apnea, unspecified: Secondary | ICD-10-CM | POA: Diagnosis present

## 2024-02-26 DIAGNOSIS — N400 Enlarged prostate without lower urinary tract symptoms: Secondary | ICD-10-CM | POA: Diagnosis present

## 2024-02-26 DIAGNOSIS — Z79899 Other long term (current) drug therapy: Secondary | ICD-10-CM

## 2024-02-26 DIAGNOSIS — Z833 Family history of diabetes mellitus: Secondary | ICD-10-CM

## 2024-02-26 DIAGNOSIS — B1920 Unspecified viral hepatitis C without hepatic coma: Secondary | ICD-10-CM | POA: Diagnosis present

## 2024-02-26 DIAGNOSIS — Z5948 Other specified lack of adequate food: Secondary | ICD-10-CM | POA: Diagnosis not present

## 2024-02-26 DIAGNOSIS — Z801 Family history of malignant neoplasm of trachea, bronchus and lung: Secondary | ICD-10-CM

## 2024-02-26 DIAGNOSIS — Z5941 Food insecurity: Secondary | ICD-10-CM

## 2024-02-26 DIAGNOSIS — F1021 Alcohol dependence, in remission: Secondary | ICD-10-CM | POA: Diagnosis present

## 2024-02-26 DIAGNOSIS — G4700 Insomnia, unspecified: Secondary | ICD-10-CM | POA: Diagnosis present

## 2024-02-26 DIAGNOSIS — G8929 Other chronic pain: Secondary | ICD-10-CM | POA: Diagnosis present

## 2024-02-26 DIAGNOSIS — E872 Acidosis, unspecified: Secondary | ICD-10-CM | POA: Diagnosis present

## 2024-02-26 DIAGNOSIS — Z885 Allergy status to narcotic agent status: Secondary | ICD-10-CM | POA: Diagnosis not present

## 2024-02-26 DIAGNOSIS — N19 Unspecified kidney failure: Secondary | ICD-10-CM

## 2024-02-26 DIAGNOSIS — J449 Chronic obstructive pulmonary disease, unspecified: Secondary | ICD-10-CM | POA: Diagnosis present

## 2024-02-26 DIAGNOSIS — R4689 Other symptoms and signs involving appearance and behavior: Secondary | ICD-10-CM

## 2024-02-26 MED ORDER — ADULT MULTIVITAMIN W/MINERALS CH: 1.0000 | ORAL_TABLET | Freq: Every day | ORAL | Status: AC

## 2024-02-26 MED ORDER — FERROUS SULFATE 325 (65 FE) MG PO TABS: 325.0000 mg | ORAL_TABLET | Freq: Every day | ORAL | Status: AC

## 2024-02-26 MED ORDER — HYDROXYZINE HCL 25 MG PO TABS: 25.0000 mg | ORAL_TABLET | Freq: Two times a day (BID) | ORAL | Status: DC | PRN

## 2024-02-26 MED ORDER — FOLIC ACID 1 MG PO TABS: 1.0000 mg | ORAL_TABLET | Freq: Every day | ORAL | Status: AC

## 2024-02-26 MED ORDER — QUETIAPINE FUMARATE 200 MG PO TABS
200.0000 mg | ORAL_TABLET | Freq: Every day | ORAL | Status: DC
  Administered 2024-02-26 – 2024-03-01 (×5): 200 mg via ORAL
  Filled 2024-02-26 (×5): qty 1

## 2024-02-26 MED ORDER — ENOXAPARIN SODIUM 40 MG/0.4ML IJ SOSY
40.0000 mg | PREFILLED_SYRINGE | INTRAMUSCULAR | Status: DC
  Administered 2024-02-26 – 2024-03-01 (×5): 40 mg via SUBCUTANEOUS
  Filled 2024-02-26 (×5): qty 0.4

## 2024-02-26 MED ORDER — LORAZEPAM 2 MG/ML IJ SOLN: 2.0000 mg | Freq: Three times a day (TID) | INTRAMUSCULAR | Status: DC | PRN

## 2024-02-26 MED ORDER — HALOPERIDOL LACTATE 5 MG/ML IJ SOLN
5.0000 mg | Freq: Three times a day (TID) | INTRAMUSCULAR | Status: DC | PRN
  Administered 2024-02-28: 5 mg via INTRAMUSCULAR

## 2024-02-26 MED ORDER — FERROUS SULFATE 325 (65 FE) MG PO TABS
325.0000 mg | ORAL_TABLET | Freq: Every day | ORAL | Status: DC
  Administered 2024-02-27 – 2024-03-01 (×4): 325 mg via ORAL
  Filled 2024-02-26 (×4): qty 1

## 2024-02-26 MED ORDER — BUDESON-GLYCOPYRROL-FORMOTEROL 160-9-4.8 MCG/ACT IN AERO
2.0000 | INHALATION_SPRAY | Freq: Two times a day (BID) | RESPIRATORY_TRACT | Status: DC
  Administered 2024-02-26 – 2024-02-29 (×4): 2 via RESPIRATORY_TRACT
  Filled 2024-02-26: qty 5.9

## 2024-02-26 MED ORDER — FOLIC ACID 1 MG PO TABS
1.0000 mg | ORAL_TABLET | Freq: Every day | ORAL | Status: DC
  Administered 2024-02-27 – 2024-03-01 (×3): 1 mg via ORAL
  Filled 2024-02-26 (×4): qty 1

## 2024-02-26 MED ORDER — THIAMINE HCL 100 MG/ML IJ SOLN: 100.0000 mg | Freq: Every day | INTRAMUSCULAR | Status: DC

## 2024-02-26 MED ORDER — OLANZAPINE 10 MG PO TABS
10.0000 mg | ORAL_TABLET | Freq: Every day | ORAL | Status: DC
  Administered 2024-02-26 – 2024-02-27 (×2): 10 mg via ORAL
  Filled 2024-02-26 (×2): qty 1

## 2024-02-26 MED ORDER — ACETAMINOPHEN 500 MG PO TABS
1000.0000 mg | ORAL_TABLET | Freq: Four times a day (QID) | ORAL | Status: DC | PRN
  Administered 2024-02-29: 1000 mg via ORAL
  Filled 2024-02-26: qty 2

## 2024-02-26 MED ORDER — COLLAGENASE 250 UNIT/GM EX OINT
1.0000 | TOPICAL_OINTMENT | Freq: Every day | CUTANEOUS | Status: DC
  Administered 2024-02-26: 1 via TOPICAL
  Filled 2024-02-26: qty 30

## 2024-02-26 MED ORDER — TRAZODONE HCL 50 MG PO TABS
50.0000 mg | ORAL_TABLET | Freq: Every evening | ORAL | Status: DC | PRN
  Administered 2024-02-26 – 2024-03-01 (×3): 50 mg via ORAL
  Filled 2024-02-26 (×4): qty 1

## 2024-02-26 MED ORDER — DIPHENHYDRAMINE HCL 50 MG/ML IJ SOLN
50.0000 mg | Freq: Three times a day (TID) | INTRAMUSCULAR | Status: DC | PRN
  Administered 2024-02-28: 50 mg via INTRAMUSCULAR
  Filled 2024-02-26: qty 1

## 2024-02-26 MED ORDER — HALOPERIDOL LACTATE 5 MG/ML IJ SOLN
10.0000 mg | Freq: Three times a day (TID) | INTRAMUSCULAR | Status: DC | PRN
  Filled 2024-02-26: qty 2

## 2024-02-26 MED ORDER — DIPHENHYDRAMINE HCL 25 MG PO CAPS: 25.0000 mg | ORAL_CAPSULE | Freq: Four times a day (QID) | ORAL | Status: DC | PRN

## 2024-02-26 MED ORDER — ALBUTEROL SULFATE (2.5 MG/3ML) 0.083% IN NEBU: 2.5000 mg | INHALATION_SOLUTION | Freq: Four times a day (QID) | RESPIRATORY_TRACT | Status: DC | PRN

## 2024-02-26 MED ORDER — BUPRENORPHINE HCL-NALOXONE HCL 8-2 MG SL SUBL
1.0000 | SUBLINGUAL_TABLET | Freq: Every day | SUBLINGUAL | Status: DC
  Administered 2024-02-27: 1 via SUBLINGUAL
  Filled 2024-02-26: qty 1

## 2024-02-26 MED ORDER — BUPRENORPHINE HCL-NALOXONE HCL 2-0.5 MG SL SUBL
2.0000 | SUBLINGUAL_TABLET | Freq: Every day | SUBLINGUAL | Status: DC
  Administered 2024-02-27: 2 via SUBLINGUAL
  Filled 2024-02-26: qty 2

## 2024-02-26 MED ORDER — LORAZEPAM 2 MG/ML IJ SOLN
2.0000 mg | Freq: Three times a day (TID) | INTRAMUSCULAR | Status: DC | PRN
  Administered 2024-02-28: 2 mg via INTRAMUSCULAR
  Filled 2024-02-26: qty 1

## 2024-02-26 MED ORDER — THIAMINE MONONITRATE 100 MG PO TABS
100.0000 mg | ORAL_TABLET | Freq: Every day | ORAL | Status: DC
  Administered 2024-02-27 – 2024-03-01 (×4): 100 mg via ORAL
  Filled 2024-02-26 (×4): qty 1

## 2024-02-26 MED ORDER — HALOPERIDOL 5 MG PO TABS: 5.0000 mg | ORAL_TABLET | Freq: Three times a day (TID) | ORAL | Status: DC | PRN

## 2024-02-26 MED ORDER — ALUM & MAG HYDROXIDE-SIMETH 200-200-20 MG/5ML PO SUSP: 30.0000 mL | ORAL | Status: DC | PRN

## 2024-02-26 MED ORDER — QUETIAPINE FUMARATE 25 MG PO TABS
50.0000 mg | ORAL_TABLET | Freq: Every morning | ORAL | Status: DC
  Administered 2024-02-27 – 2024-03-01 (×4): 50 mg via ORAL
  Filled 2024-02-26 (×4): qty 2

## 2024-02-26 MED ORDER — DIPHENHYDRAMINE HCL 25 MG PO CAPS: 50.0000 mg | ORAL_CAPSULE | Freq: Three times a day (TID) | ORAL | Status: DC | PRN

## 2024-02-26 MED ORDER — COLLAGENASE 250 UNIT/GM EX OINT: 1.0000 | TOPICAL_OINTMENT | Freq: Every day | CUTANEOUS | Status: AC

## 2024-02-26 MED ORDER — PANTOPRAZOLE SODIUM 40 MG PO TBEC
40.0000 mg | DELAYED_RELEASE_TABLET | Freq: Every day | ORAL | Status: DC
  Administered 2024-02-27 – 2024-03-01 (×4): 40 mg via ORAL
  Filled 2024-02-26 (×4): qty 1

## 2024-02-26 MED ORDER — MAGNESIUM HYDROXIDE 400 MG/5ML PO SUSP
30.0000 mL | Freq: Every day | ORAL | Status: DC | PRN
  Filled 2024-02-26: qty 30

## 2024-02-26 MED ORDER — VITAMIN B-1 100 MG PO TABS: 100.0000 mg | ORAL_TABLET | Freq: Every day | ORAL | Status: AC

## 2024-02-26 MED ORDER — MEDIHONEY WOUND/BURN DRESSING EX PSTE
1.0000 | PASTE | Freq: Every day | CUTANEOUS | Status: DC
  Filled 2024-02-26 (×2): qty 44

## 2024-02-26 MED ORDER — SERTRALINE HCL 25 MG PO TABS
25.0000 mg | ORAL_TABLET | Freq: Every day | ORAL | Status: DC
  Administered 2024-02-27 – 2024-03-01 (×4): 25 mg via ORAL
  Filled 2024-02-26 (×4): qty 1

## 2024-02-26 MED ORDER — GABAPENTIN 100 MG PO CAPS
100.0000 mg | ORAL_CAPSULE | Freq: Three times a day (TID) | ORAL | Status: DC
  Administered 2024-02-26 – 2024-03-01 (×13): 100 mg via ORAL
  Filled 2024-02-26 (×14): qty 1

## 2024-02-26 MED ORDER — ADULT MULTIVITAMIN W/MINERALS CH
1.0000 | ORAL_TABLET | Freq: Every day | ORAL | Status: DC
  Administered 2024-02-27 – 2024-03-01 (×3): 1 via ORAL
  Filled 2024-02-26 (×4): qty 1

## 2024-02-26 MED ORDER — DIPHENHYDRAMINE HCL 50 MG/ML IJ SOLN: 50.0000 mg | Freq: Three times a day (TID) | INTRAMUSCULAR | Status: DC | PRN

## 2024-02-26 NOTE — Consult Note (Addendum)
" °  CLINICAL SUPPORT TEAM - WOUND OSTOMY AND CONTINENCE TEAM  CONSULTATION SERVICES   WOC Nurse-Inpatient Note   WOC Nurse Consult Note: Reason for Consult: Requested to assess skin tear on R great toe. Wound type: Full thickness, neuropathic ulcer? Pressure Injury POA: NA Measurement: Approx. 3 cm x 2 cm (see nursing flowsheet) Wound bed: 90% brown eschar, 10% red. Drainage (amount, consistency, odor) None. Periwound: intact, peeling skin. Dressing procedure/placement/frequency: Cleanse with Vashe # [id number], not rinse. Apply a thick layer of Santyl  to the wound bed daily, cover with moistened gauze with saline. Top with dry gauze, wrap with stretch bandage, change daily.  WOC team will not plan to follow further. Please reconsult if further assistance is needed. Thank-you,  Lela Holm MSN, RN, CWCN, CNS.  (Phone [phone])     "

## 2024-02-26 NOTE — Progress Notes (Signed)
" °   02/26/24 1740  Assessment Details  Time of Assessment Admission  Information Obtained From Patient  To be done on Admission  Risk Factors related to Demographics Male;Caucasian;Unemployed  Current Mental Status NA  Loss Factors NA  Historical Factors NA  Risk Reduction Factors Living with another person, especially a relative;Positive social support  [place] Suicide Severity Rating Scale  1. In the past month -  Have you wished you were dead or wished you could go to sleep and not wake up? No  2. In the past month - Have you actually had any thoughts of killing yourself? No  6. Have you ever done anything, started to do anything, or prepared to do anything to end your life? No  C-SSRS RISK CATEGORY No Risk  [facility] Suicide Precaution Interventions  [facility] Suicide Bundle Interventions No Risk Interventions implemented  [place] Suicide Severity Rating Scale-Reassessment  2. Since last contact: Have you actually had thoughts about killing yourself? No  6. Since last contact: Have you done anything, started to do anything, or prepared to do anything to end your life? No  C-SSRS Reassessment Risk Category Score No Risk  [facility] Suicide Reassessment Precaution Interventions  [facility] Suicide Reassessment Bundle Interventions Low Risk Interventions implemented    "

## 2024-02-26 NOTE — Progress Notes (Signed)
 Pt calm and pleasant during assessment denying SI/HI/AVH. Pt observed interacting appropriately with staff and peers on the unit by this Clinical research associate. Pt compliant with medication administration per MD orders. Pt given education, support, and encouragement to be active in his treatment plan. Pt being monitored Q 15 minutes for safety per unit protocol, remains safe on the unit

## 2024-02-26 NOTE — Plan of Care (Signed)
" °  Problem: Education: Goal: Knowledge of [facility] Education information/materials will improve Outcome: Not Progressing Goal: Emotional status will improve Outcome: Not Progressing Goal: Mental status will improve Outcome: Not Progressing Goal: Verbalization of understanding the information provided will improve Outcome: Not Progressing   Problem: Activity: Goal: Interest or engagement in activities will improve Outcome: Not Progressing Goal: Sleeping patterns will improve Outcome: Not Progressing   Problem: Coping: Goal: Ability to verbalize frustrations and anger appropriately will improve Outcome: Not Progressing Goal: Ability to demonstrate self-control will improve Outcome: Not Progressing   Problem: Health Behavior/Discharge Planning: Goal: Identification of resources available to assist in meeting health care needs will improve Outcome: Not Progressing Goal: Compliance with treatment plan for underlying cause of condition will improve Outcome: Not Progressing   Problem: Physical Regulation: Goal: Ability to maintain clinical measurements within normal limits will improve Outcome: Not Progressing   Problem: Safety: Goal: Periods of time without injury will increase Outcome: Not Progressing   Problem: Education: Goal: Ability to state activities that reduce stress will improve Outcome: Not Progressing   Problem: Coping: Goal: Ability to identify and develop effective coping behavior will improve Outcome: Not Progressing   Problem: Self-Concept: Goal: Ability to identify factors that promote anxiety will improve Outcome: Not Progressing Goal: Level of anxiety will decrease Outcome: Not Progressing Goal: Ability to modify response to factors that promote anxiety will improve Outcome: Not Progressing   Pt new admission 02/26/2024 "

## 2024-02-26 NOTE — Tx Team (Signed)
 Initial Treatment Plan 02/26/2024 6:53 PM [name] FMW:[id number]    PATIENT STRESSORS: Health problems     PATIENT STRENGTHS: Supportive family/friends    PATIENT IDENTIFIED PROBLEMS: Anxiety                     DISCHARGE CRITERIA:  Ability to meet basic life and health needs Improved stabilization in mood, thinking, and/or behavior Verbal commitment to aftercare and medication compliance  PRELIMINARY DISCHARGE PLAN: Outpatient therapy Return to previous living arrangement  PATIENT/FAMILY INVOLVEMENT: This treatment plan has been presented to and reviewed with the patient, Richard Vasquez, and/or family member.  The patient and family have been given the opportunity to ask questions and make suggestions.  Arthea Cleaves, RN 02/26/2024, 6:53 PM

## 2024-02-26 NOTE — Group Note (Signed)
 Date:  02/26/2024 Time:  9:21 PM  Group Topic/Focus:  Wrap-Up Group:   The focus of this group is to help patients review their daily goal of treatment and discuss progress on daily workbooks.    Participation Level:  Active  Participation Quality:  Appropriate and Attentive  Affect:  Appropriate  Cognitive:  Alert and Appropriate  Insight: Appropriate  Engagement in Group:  Engaged  Modes of Intervention:  Discussion and Support  Additional Comments:    Ginny JONETTA Galeazzi 02/26/2024, 9:21 PM

## 2024-02-26 NOTE — Progress Notes (Signed)
 Mobility Specialist Progress Note:    02/26/24 1035  Mobility  Activity Ambulated with assistance  Level of Assistance Modified independent, requires aide device or extra time  Assistive Device None  Distance Ambulated (ft) 320 ft  Range of Motion/Exercises Active;All extremities  Activity Response Tolerated well  Mobility visit 1 Mobility  Mobility Specialist Start Time (ACUTE ONLY) 1016  Mobility Specialist Stop Time (ACUTE ONLY) 1035  Mobility Specialist Time Calculation (min) (ACUTE ONLY) 19 min   Pt received ambulating with NT and spouse, MS acquired session. Pt ModI to ambulate with no AD. Tolerated well, very pleasant throughout session. Returned to research scientist (physical sciences) and spouse at bedside. All needs met.   Sherrilee Ditty Mobility Specialist Please contact via Special Educational Needs Teacher or  Rehab office at [phone]

## 2024-02-26 NOTE — Progress Notes (Signed)
 Pt wound care, reported to this RN by Royce RN, completed today per report. Pt has wound to Right Great toe.

## 2024-02-26 NOTE — Progress Notes (Signed)
" °   02/26/24 1800  Psych Admission Type (Psych Patients Only)  Admission Status Involuntary  Psychosocial Assessment  Patient Complaints Anxiety  Eye Contact Brief  Facial Expression Flat  Affect Anxious  Speech Logical/coherent  Interaction Assertive  Motor Activity Slow;Unsteady  Appearance/Hygiene Disheveled  Behavior Characteristics Cooperative;Appropriate to situation;Calm  Mood Pleasant  Thought Process  Coherency WDL  Content WDL  Delusions None reported or observed  Perception WDL  Hallucination None reported or observed  Judgment Impaired  Confusion WDL  Danger to Self  Current suicidal ideation? Denies  Danger to Others  Danger to Others None reported or observed    "
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
## 2024-02-27 ENCOUNTER — Encounter: Payer: Self-pay | Admitting: Internal Medicine

## 2024-02-27 LAB — LIPID PANEL
Cholesterol: 161 mg/dL (ref 0–200)
HDL: 51 mg/dL
LDL Cholesterol: 91 mg/dL (ref 0–99)
Total CHOL/HDL Ratio: 3.2 ratio
Triglycerides: 96 mg/dL
VLDL: 19 mg/dL (ref 0–40)

## 2024-02-27 LAB — HEMOGLOBIN A1C
Hgb A1c MFr Bld: 5.5 % (ref 4.8–5.6)
Mean Plasma Glucose: 111.15 mg/dL

## 2024-02-27 MED ORDER — BUPRENORPHINE HCL-NALOXONE HCL 8-2 MG SL SUBL
1.0000 | SUBLINGUAL_TABLET | Freq: Two times a day (BID) | SUBLINGUAL | Status: DC
  Administered 2024-02-27 – 2024-02-28 (×3): 1 via SUBLINGUAL
  Filled 2024-02-27 (×4): qty 1

## 2024-02-27 NOTE — Plan of Care (Signed)
   Problem: Education: Goal: Emotional status will improve Outcome: Progressing Goal: Mental status will improve Outcome: Progressing

## 2024-02-27 NOTE — Group Note (Signed)
 Recreation Therapy Group Note   Group Topic:Relaxation  Group Date: 02/27/2024 Start Time: 1040 End Time: 1120 Facilitators: Celestia Jeoffrey BRAVO, LRT, CTRS Location: Dayroom  Group Description: Meditation. LRT and patients discussed what they know about meditation and mindfulness. LRT played a Deep Breathing Meditation exercise script for patients to follow along to. LRT and patients discussed how meditation and deep breathing can be used as a coping skill post--discharge to help manage symptoms of stress.   Goal Area(s) Addressed: Patient will practice using relaxation technique. Patient will identify a new coping skill.  Patient will follow multistep directions to reduce anxiety and stress.   Affect/Mood: Appropriate   Participation Level: Active and Engaged   Participation Quality: Independent   Behavior: Calm and Cooperative   Speech/Thought Process: Coherent   Insight: Good   Judgement: Good   Modes of Intervention: Activity, Education, and Exploration   Patient Response to Interventions:  Attentive, Engaged, Interested , and Receptive   Education Outcome:  Acknowledges education   Clinical Observations/Individualized Feedback: Richard Vasquez was active in their participation of session activities and group discussion. Pt completed all exercises as prompted.    Plan: Continue to engage patient in RT group sessions 2-3x/week.   Jeoffrey BRAVO Celestia, LRT, CTRS 02/27/2024 11:34 AM

## 2024-02-27 NOTE — [facility] Suicide Risk Assessment (Signed)
 [facility] INPATIENT:  Family/Significant Other Suicide Prevention Education  Suicide Prevention Education:  Education Completed; [name] Glasco/wife [phone]), has been identified by the patient as the family member/significant other with whom the patient will be residing, and identified as the person(s) who will aid the patient in the event of a mental health crisis (suicidal ideations/suicide attempt).  With written consent from the patient, the family member/significant other has been provided the following suicide prevention education, prior to the and/or following the discharge of the patient.  The suicide prevention education provided includes the following: Suicide risk factors Suicide prevention and interventions National Suicide Hotline telephone number [facility] assessment telephone number [facility] Emergency Assistance 911 [facility] and/or Residential Mobile Crisis Unit telephone number  Request made of family/significant other to: Remove weapons (e.g., guns, rifles, knives), all items previously/currently identified as safety concern.   Remove drugs/medications (over-the-counter, prescriptions, illicit drugs), all items previously/currently identified as a safety concern.  The family member/significant other verbalizes understanding of the suicide prevention education information provided.  The family member/significant other agrees to remove the items of safety concern listed above.  Glasco shared that pt had taken some over the counter medications to help with a cold. She stated that after pt stopped taking them he was not acting like himself. Glasco shared that after four days since taking the Nyquil pt stated trying to take furniture out of the house and she knew that he needed help. Glasco stated that this sometimes happens when pt takes over the counter medication. She stated that his anxiety went through the roof and when this happen he becomes activated.  She denied feeling that pt is a danger to himself or anyone else. Glasco denied pt having any access to weapons in the home. No other concerns expressed. Contact ended without incident.   Nadara JONELLE Fam 02/27/2024, 3:26 PM

## 2024-02-27 NOTE — [facility] Group Notes (Signed)
 Spirituality Group   Group Goal: Support / Education around grief and loss   Secondary Goal: Self compassion and strength awareness  Group Description: Following introductions and group rules, group members engaged in facilitated group dialog and support around topic of loss, with particular support around experiences of loss in their lives. Group members identified types of loss (relationships / self / things) as well as patterns, circumstances, and changes that precipitate loss. Reflection invited on thoughts / feelings around loss, normalized grief responses, and recognized variety in grief experience. Group noted Worden's four tasks of grief in discussion. Group drew on Adlerian / Rogerian, narrative, MI, with [place] group therapy as a primary framework.   Observations: Did not attend  Wynton Hufstetler L. Delores [name].Div
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
## 2024-02-27 NOTE — [facility] IP Treatment Plan (Signed)
 Interdisciplinary Treatment and Diagnostic Plan Update  02/27/2024 Time of Session: 10:28 Richard Vasquez MRN: [id number]  Principal Diagnosis: Schizophrenia, paranoid type [facility])  Secondary Diagnoses: Principal Problem:   Schizophrenia, paranoid type (HCC)   Current Medications:  Current Facility-Administered Medications  Medication Dose Route Frequency Provider Last Rate Last Admin   acetaminophen  (TYLENOL ) tablet 1,000 mg  1,000 mg Oral Q6H PRN Donnelly Mellow, MD       albuterol  (PROVENTIL ) (2.5 MG/3ML) 0.083% nebulizer solution 2.5 mg  2.5 mg Inhalation Q6H PRN Jadapalle, Sree, MD       alum & mag hydroxide-simeth (MAALOX/MYLANTA) 200-200-20 MG/5ML suspension 30 mL  30 mL Oral Q4H PRN Jadapalle, Sree, MD       budesonide -glycopyrrolate -formoterol  (BREZTRI ) 160-9-4.8 MCG/ACT inhaler 2 puff  2 puff Inhalation BID Jadapalle, Sree, MD   2 puff at 02/26/24 2117   buprenorphine -naloxone  (SUBOXONE ) 8-2 mg per SL tablet 1 tablet  1 tablet Sublingual Daily Jadapalle, Sree, MD   1 tablet at 02/27/24 0827   And   buprenorphine -naloxone  (SUBOXONE ) 2-0.5 mg per SL tablet 2 tablet  2 tablet Sublingual Daily Jadapalle, Sree, MD   2 tablet at 02/27/24 9172   diphenhydrAMINE  (BENADRYL ) capsule 25 mg  25 mg Oral Q6H PRN Jadapalle, Sree, MD       haloperidol  (HALDOL ) tablet 5 mg  5 mg Oral TID PRN Jadapalle, Sree, MD       And   diphenhydrAMINE  (BENADRYL ) capsule 50 mg  50 mg Oral TID PRN Jadapalle, Sree, MD       haloperidol  lactate (HALDOL ) injection 5 mg  5 mg Intramuscular TID PRN Jadapalle, Sree, MD       And   diphenhydrAMINE  (BENADRYL ) injection 50 mg  50 mg Intramuscular TID PRN Jadapalle, Sree, MD       And   LORazepam  (ATIVAN ) injection 2 mg  2 mg Intramuscular TID PRN Jadapalle, Sree, MD       haloperidol  lactate (HALDOL ) injection 10 mg  10 mg Intramuscular TID PRN Jadapalle, Sree, MD       And   diphenhydrAMINE  (BENADRYL ) injection 50 mg  50 mg Intramuscular TID PRN Jadapalle, Sree, MD        And   LORazepam  (ATIVAN ) injection 2 mg  2 mg Intramuscular TID PRN Jadapalle, Sree, MD       enoxaparin  (LOVENOX ) injection 40 mg  40 mg Subcutaneous Q24H Jadapalle, Sree, MD   40 mg at 02/26/24 2116   ferrous sulfate  tablet 325 mg  325 mg Oral Q breakfast Jadapalle, Sree, MD   325 mg at 02/27/24 0827   folic acid  (FOLVITE ) tablet 1 mg  1 mg Oral Daily Jadapalle, Sree, MD   1 mg at 02/27/24 9172   gabapentin  (NEURONTIN ) capsule 100 mg  100 mg Oral TID Jadapalle, Sree, MD   100 mg at 02/27/24 0827   hydrOXYzine  (ATARAX ) tablet 25 mg  25 mg Oral BID PRN Jadapalle, Sree, MD       leptospermum manuka honey (MEDIHONEY) paste 1 Application  1 Application Topical Daily Jadapalle, Sree, MD       magnesium  hydroxide (MILK OF MAGNESIA) suspension 30 mL  30 mL Oral Daily PRN Jadapalle, Sree, MD       multivitamin with minerals tablet 1 tablet  1 tablet Oral Daily Jadapalle, Sree, MD   1 tablet at 02/27/24 0827   OLANZapine  (ZYPREXA ) tablet 10 mg  10 mg Oral QHS Jadapalle, Sree, MD   10 mg at 02/26/24 2115  pantoprazole  (PROTONIX ) EC tablet 40 mg  40 mg Oral Daily Jadapalle, Sree, MD   40 mg at 02/27/24 0827   QUEtiapine  (SEROQUEL ) tablet 200 mg  200 mg Oral QHS Jadapalle, Sree, MD   200 mg at 02/26/24 2116   QUEtiapine  (SEROQUEL ) tablet 50 mg  50 mg Oral q morning Jadapalle, Sree, MD       sertraline  (ZOLOFT ) tablet 25 mg  25 mg Oral Daily Jadapalle, Sree, MD   25 mg at 02/27/24 9171   thiamine  (VITAMIN B1) tablet 100 mg  100 mg Oral Daily Jadapalle, Sree, MD   100 mg at 02/27/24 9172   Or   thiamine  (VITAMIN B1) injection 100 mg  100 mg Intravenous Daily Jadapalle, Sree, MD       traZODone  (DESYREL ) tablet 50 mg  50 mg Oral QHS PRN Jadapalle, Sree, MD   50 mg at 02/26/24 2116   PTA Medications: Medications Prior to Admission  Medication Sig Dispense Refill Last Dose/Taking   ADVAIR DISKUS 100-50 MCG/ACT AEPB Inhale 1 puff into the lungs 2 (two) times daily.      albuterol  (VENTOLIN  HFA) 108 (90  Base) MCG/ACT inhaler Inhale 2 puffs into the lungs every 6 (six) hours as needed for wheezing or shortness of breath. 8 g 2    collagenase  (SANTYL ) 250 UNIT/GM ointment Apply 1 Application topically daily.      ferrous sulfate  325 (65 FE) MG tablet Take 1 tablet (325 mg total) by mouth daily with breakfast.      Fluticasone -Umeclidin-Vilant (TRELEGY ELLIPTA ) 200-62.5-25 MCG/ACT AEPB Inhale 1 puff into the lungs daily. 3 each 6    folic acid  (FOLVITE ) 1 MG tablet Take 1 tablet (1 mg total) by mouth daily.      gabapentin  (NEURONTIN ) 100 MG capsule Take 100 mg by mouth 3 (three) times daily as needed.      hydrOXYzine  (VISTARIL ) 25 MG capsule Take 25 mg by mouth 4 (four) times daily as needed for anxiety.      meclizine  (ANTIVERT ) 25 MG tablet Take 1 tablet (25 mg total) by mouth 3 (three) times daily as needed for dizziness. 30 tablet 0    Multiple Vitamin (MULTIVITAMIN WITH MINERALS) TABS tablet Take 1 tablet by mouth daily.      OLANZapine  (ZYPREXA ) 10 MG tablet Take 10mg  by mouth once daily (Patient taking differently: Take 10 mg by mouth at bedtime.) 30 tablet 0    pantoprazole  (PROTONIX ) 40 MG tablet TAKE 1 TABLET BY MOUTH EVERY DAY 90 tablet 0    QUEtiapine  (SEROQUEL ) 200 MG tablet Take 200 mg by mouth at bedtime.      QUEtiapine  (SEROQUEL ) 50 MG tablet Take 50 mg by mouth daily as needed.      rosuvastatin  (CRESTOR ) 40 MG tablet Take 1 tablet (40 mg total) by mouth daily. 90 tablet 3    sertraline  (ZOLOFT ) 25 MG tablet Take 25 mg by mouth daily.      SUBOXONE  12-3 MG FILM Place 0.33 Film under the tongue 3 (three) times daily.      thiamine  (VITAMIN B-1) 100 MG tablet Take 1 tablet (100 mg total) by mouth daily.       Patient Stressors: Health problems    Patient Strengths: Supportive family/friends   Treatment Modalities: Medication Management, Group therapy, Case management,  1 to 1 session with clinician, Psychoeducation, Recreational therapy.   Physician Treatment Plan for  Primary Diagnosis: Schizophrenia, paranoid type (HCC) Long Term Goal(s):     Short Term Goals:  Medication Management: Evaluate patient's response, side effects, and tolerance of medication regimen.  Therapeutic Interventions: 1 to 1 sessions, Unit Group sessions and Medication administration.  Evaluation of Outcomes: Not Met  Physician Treatment Plan for Secondary Diagnosis: Principal Problem:   Schizophrenia, paranoid type (HCC)  Long Term Goal(s):     Short Term Goals:       Medication Management: Evaluate patient's response, side effects, and tolerance of medication regimen.  Therapeutic Interventions: 1 to 1 sessions, Unit Group sessions and Medication administration.  Evaluation of Outcomes: Not Met   RN Treatment Plan for Primary Diagnosis: Schizophrenia, paranoid type (HCC) Long Term Goal(s): Knowledge of disease and therapeutic regimen to maintain health will improve  Short Term Goals: Ability to remain free from injury will improve, Ability to verbalize frustration and anger appropriately will improve, Ability to demonstrate self-control, Ability to participate in decision making will improve, Ability to verbalize feelings will improve, Ability to disclose and discuss suicidal ideas, Ability to identify and develop effective coping behaviors will improve, and Compliance with prescribed medications will improve  Medication Management: RN will administer medications as ordered by provider, will assess and evaluate patient's response and provide education to patient for prescribed medication. RN will report any adverse and/or side effects to prescribing provider.  Therapeutic Interventions: 1 on 1 counseling sessions, Psychoeducation, Medication administration, Evaluate responses to treatment, Monitor vital signs and CBGs as ordered, Perform/monitor CIWA, COWS, AIMS and Fall Risk screenings as ordered, Perform wound care treatments as ordered.  Evaluation of Outcomes: Not  Met   LCSW Treatment Plan for Primary Diagnosis: Schizophrenia, paranoid type (HCC) Long Term Goal(s): Safe transition to appropriate next level of care at discharge, Engage patient in therapeutic group addressing interpersonal concerns.  Short Term Goals: Engage patient in aftercare planning with referrals and resources, Increase social support, Increase ability to appropriately verbalize feelings, Increase emotional regulation, Facilitate acceptance of mental health diagnosis and concerns, Facilitate patient progression through stages of change regarding substance use diagnoses and concerns, Identify triggers associated with mental health/substance abuse issues, and Increase skills for wellness and recovery  Therapeutic Interventions: Assess for all discharge needs, 1 to 1 time with Social worker, Explore available resources and support systems, Assess for adequacy in community support network, Educate family and significant other(s) on suicide prevention, Complete Psychosocial Assessment, Interpersonal group therapy.  Evaluation of Outcomes: Not Met   Progress in Treatment: Attending groups: Yes. Participating in groups: Yes. Taking medication as prescribed: Yes. Toleration medication: Yes. Family/Significant other contact made: No, will contact:  when given permission.  Patient understands diagnosis: Yes. Discussing patient identified problems/goals with staff: Yes. Medical problems stabilized or resolved: Yes. Denies suicidal/homicidal ideation: Yes. Issues/concerns per patient self-inventory: No. Other: none.  New problem(s) identified: No, Describe:  none identified.   New Short Term/Long Term Goal(s): medication management for mood stabilization; elimination of SI thoughts; development of comprehensive mental wellness/sobriety plan.  Patient Goals: To be a loving husband to my wife and be more productive with my church.   Discharge Plan or Barriers: CSW will assist pt with  development of an appropriate aftercare/discharge plan.   Reason for Continuation of Hospitalization: Anxiety Medication stabilization  Estimated Length of Stay: 1-7 days  Last 3 [place] Suicide Severity Risk Score: Flowsheet Row Admission (Current) from 02/26/2024 in [facility] INPATIENT BEHAV[facility]ICINE ED to Hosp-Admission (Discharged) from 02/23/2024 in [facility] from 02/22/2024 in [facility] Emergency Department at [facility]  C-SSRS RISK CATEGORY No Risk No  Risk No Risk    Last PHQ 2/9 Scores:    04/24/2023    3:35 PM 03/27/2023    1:23 PM 12/30/2022    2:07 PM  Depression screen PHQ 2/9  Decreased Interest 0 0 0  Down, Depressed, Hopeless 0 0 0  PHQ - 2 Score 0 0 0  Altered sleeping 0 0   Tired, decreased energy 2 2   Change in appetite 2 0   Feeling bad or failure about yourself  0 0   Trouble concentrating 0 0   Moving slowly or fidgety/restless 0 0   Suicidal thoughts 0 0   PHQ-9 Score 4  2    Difficult doing work/chores  Somewhat difficult      Data saved with a previous flowsheet row definition    Scribe for Treatment Team: Nadara JONELLE Fam, LCSW 02/27/2024 11:12 AM

## 2024-02-27 NOTE — [facility] Counselor (Signed)
 Adult Comprehensive Assessment  Patient ID: Richard Vasquez, male   DOB: [birth year], 64 y.o.   MRN: [id number]  Information Source: Information source: Patient  Current Stressors:  Patient states their primary concerns and needs for treatment are:: Anxiety. Patient states their goals for this hospitilization and ongoing recovery are:: To be a loving husband to my wife and be more productive with my church. Educational / Learning stressors: None reported Employment / Job issues: None reported Family Relationships: None reported Surveyor, Quantity / Lack of resources (include bankruptcy): None reported Housing / Lack of housing: None reported Physical health (include injuries & life threatening diseases): None reported Social relationships: None reported Substance abuse: None reported Bereavement / Loss: None reported  Living/Environment/Situation:  Living Arrangements: Spouse/significant other Living conditions (as described by patient or guardian): WNL It's nice. Who else lives in the home?: Pt and his wife. How long has patient lived in current situation?: Like two years. What is atmosphere in current home: Comfortable, Loving, Supportive  Family History:  Marital status: Married Number of Years Married: 2 (Anniversary coming up on 03/02/24.) What types of issues is patient dealing with in the relationship?: Pt denied any issues. Are you sexually active?: No What is your sexual orientation?: I like females. Has your sexual activity been affected by drugs, alcohol, medication, or emotional stress?: Not sexual for a while now. I'm going to talk to my doctor about that. Bout four years now. Does patient have children?: Yes How many children?: 2 (Both adult children.) How is patient's relationship with their children?: They good. I aint' seen them in a long time. I hear from them time to time. Hadn't heard from my boy in awhile.  Childhood History:  By whom was/is the patient  raised?: Mother Additional childhood history information: Pt describes his childhood as excellent. Description of patient's relationship with caregiver when they were a child: Good. Patient's description of current relationship with people who raised him/her: Pt mother is deceased. How were you disciplined when you got in trouble as a child/adolescent?: If you did something wrong then she wore your tail out. Does patient have siblings?: Yes Number of Siblings: 1 (Younger sister.) Description of patient's current relationship with siblings: She's good. Did patient suffer any verbal/emotional/physical/sexual abuse as a child?: No Did patient suffer from severe childhood neglect?: No Has patient ever been sexually abused/assaulted/raped as an adolescent or adult?: No Was the patient ever a victim of a crime or a disaster?: No Witnessed domestic violence?: No Has patient been affected by domestic violence as an adult?: No  Education:  Highest grade of school patient has completed: Eighth grade Currently a student?: No Learning disability?: No  Employment/Work Situation:   Employment Situation: On disability Why is Patient on Disability: Pt reported being on disability for physical reasons. How Long has Patient Been on Disability: Been around four years ago. Patient's Job has Been Impacted by Current Illness: No What is the Longest Time Patient has Held a Job?: 14 years. Where was the Patient Employed at that Time?: [employer]. Has Patient ever Been in the 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 Is patient motivated for change?: No Does patient live in an environment that promotes recovery or serves as an obstacle to recovery?: No Are others in the home using alcohol or other substances?: No Are significant others in the home willing to participate in the patient's care?: Yes Describe significant others willing to participate in the patient's care: Wife Has alcohol/substance abuse ever caused legal problems?: Yes (DUI)  Social Support System:   Patient's Community Support System: Good Describe Community Support System: I have a church that supports me real good, and my wife. Type of faith/religion: [facility]. How does patient's faith help to cope with current illness?: Help me cope a lot.  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  Strengths/Needs:   What is the patient's perception of their strengths?: Supportive to my wife and to my church. Patient states they can use these personal strengths during their treatment to contribute to their recovery: It makes me a better man. Patient states these barriers may affect/interfere with their treatment: Pt denied any barriers. Patient states these barriers may affect their return to the community: Pt denied any barriers.  Discharge Plan:   Currently receiving community mental health services: Yes (From Whom) (CST services from [facility].) Patient states concerns and preferences for aftercare planning are: Pt plans to continue with current provider. Patient states they will know when they are safe and ready for discharge when: If I continue to progress like im doing. I can't wait to get on the outside to see my familiy and my church. Does patient have access to transportation?: Yes Does patient have  financial barriers related to discharge medications?: No Patient description of barriers related to discharge medications: Pt denied any barriers. Will patient be returning to same living situation after discharge?: Yes  Summary/Recommendations:   Summary and Recommendations (to be completed by the evaluator): Patient is a 64 year old, married, male from [place], [place] [facility]). He reported that he came into the hospital because of anxiety. Pt shared that their goal is to work to be a loving husband to my wife and be more productive with my church. He reported that he lives at home with his wife and plans to return there upon discharge. Pt denied any stressors in the community for him. He also denied any history of trauma or abuse. 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 However, he shared that he drank one beer the other week. He endorsed a history of inpatient and outpatient treatment in the past. Pt shared that he receives CST services through [facility] and plans to continue with them post discharge. Recommendations include: crisis stabilization, therapeutic milieu, encourage group attendance and participation, medication management for mood stabilization and development of a comprehensive mental wellness/sobriety plan.  Nadara JONELLE Fam. 02/27/2024

## 2024-02-27 NOTE — Group Note (Signed)
 Recreation Therapy Group Note   Group Topic:Leisure Education  Group Date: 02/27/2024 Start Time: 1530 End Time: 1620 Facilitators: Celestia Jeoffrey FORBES ARTICE, CTRS Location: Craft Room  Group Description: Leisure. Patients were given the option to choose from journaling, coloring, drawing, making origami, playing with playdoh, listening to music or singing karaoke. LRT and pts discussed the meaning of leisure, the importance of participating in leisure during their free time/when they're outside of the hospital, as well as how our leisure interests can also serve as coping skills.   Goal Area(s) Addressed:  Patient will identify a current leisure interest.  Patient will learn the definition of leisure. Patient will practice making a positive decision. Patient will have the opportunity to try a new leisure activity. Patient will communicate with peers and LRT.    Affect/Mood: N/A   Participation Level: Did not attend    Clinical Observations/Individualized Feedback: Patient did not attend.  Plan: Continue to engage patient in RT group sessions 2-3x/week.   Jeoffrey FORBES Celestia, LRT, CTRS 02/27/2024 5:06 PM

## 2024-02-27 NOTE — Group Note (Signed)
 Date:  02/27/2024 Time:  10:06 PM   Additional Comments:  Didn't Attend  Kerri Katz 02/27/2024, 10:06 PM

## 2024-02-27 NOTE — Group Note (Signed)
 Date:  02/27/2024 Time:  1:06 PM  Group Topic/Focus:  Early Warning Signs:   The focus of this group is to help patients identify signs or symptoms they exhibit before slipping into an unhealthy state or crisis.    Participation Level:  Did Not Attend   [name] 02/27/2024, 1:06 PM
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
## 2024-02-27 NOTE — Plan of Care (Signed)
   Problem: Education: Goal: Mental status will improve Outcome: Progressing Goal: Verbalization of understanding the information provided will improve Outcome: Progressing   Problem: Activity: Goal: Interest or engagement in activities will improve Outcome: Progressing Goal: Sleeping patterns will improve Outcome: Progressing

## 2024-02-28 MED ORDER — OLANZAPINE 10 MG PO TABS
10.0000 mg | ORAL_TABLET | Freq: Two times a day (BID) | ORAL | Status: DC
  Administered 2024-02-28 – 2024-02-29 (×3): 10 mg via ORAL
  Filled 2024-02-28 (×3): qty 1

## 2024-02-28 MED ORDER — CHLORPROMAZINE HCL 100 MG PO TABS
100.0000 mg | ORAL_TABLET | Freq: Four times a day (QID) | ORAL | Status: DC | PRN
  Administered 2024-02-28: 100 mg via ORAL
  Filled 2024-02-28 (×2): qty 1

## 2024-02-28 MED ORDER — CHLORPROMAZINE HCL 50 MG/2ML IJ SOLN: 50.0000 mg | Freq: Four times a day (QID) | INTRAMUSCULAR | Status: DC | PRN

## 2024-02-28 NOTE — Plan of Care (Signed)
  Problem: Education: Goal: Knowledge of [facility] Education information/materials will improve Outcome: Progressing   Problem: Education: Goal: Emotional status will improve Outcome: Progressing   

## 2024-02-28 NOTE — Group Note (Unsigned)
 Date:  02/29/2024 Time:  2:03 AM    Additional Comments:  Did not attend group.  Richard Vasquez 02/29/2024, 2:03 AM

## 2024-02-28 NOTE — Progress Notes (Signed)
 After multiple rounds of PRN patient is calm and cooperative. Restraints discontinued at 1819. Patient is resting peacefully in his room.
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
## 2024-02-28 NOTE — Progress Notes (Signed)
" °   02/27/24 2000  Psych Admission Type (Psych Patients Only)  Admission Status Involuntary  Psychosocial Assessment  Patient Complaints Anxiety  Eye Contact Fair  Facial Expression Flat  Affect Anxious  Speech Logical/coherent  Interaction Assertive  Motor Activity Slow  Appearance/Hygiene Unremarkable  Behavior Characteristics Cooperative  Mood Pleasant  Thought Process  Coherency WDL  Content WDL  Delusions None reported or observed  Perception WDL  Hallucination None reported or observed  Judgment Impaired  Confusion WDL  Danger to Self  Current suicidal ideation? Denies  Danger to Others  Danger to Others None reported or observed   No distress noted interacting appropriately with peers and staff 15 minutes safety checks maintained.  "

## 2024-02-28 NOTE — Plan of Care (Signed)
   Problem: Education: Goal: Emotional status will improve Outcome: Not Progressing Goal: Mental status will improve Outcome: Not Progressing

## 2024-02-28 NOTE — Group Note (Signed)
 Date:  02/28/2024 Time:  10:29 PM  Group Topic/Focus:  Overcoming Stress:   The focus of this group is to define stress and help patients assess their triggers.    Participation Level:  Active  Participation Quality:  Appropriate  Affect:  Appropriate  Cognitive:  Appropriate  Insight: Appropriate  Engagement in Group:  Engaged  Modes of Intervention:  Activity  Additional Comments:    Richard Vasquez [name] [name] 02/28/2024, 10:29 PM

## 2024-02-28 NOTE — Group Note (Signed)
 Date:  02/28/2024 Time:  1:41 PM  Group Topic/Focus:  Stages of Change:   The focus of this group is to explain the stages of change and help patients identify changes they want to make upon discharge.    Participation Level:  Active  Participation Quality:  Appropriate  Affect:  Appropriate  Cognitive:  Appropriate  Insight: Appropriate  Engagement in Group:  Engaged  Modes of Intervention:  Activity  Additional Comments:    Richard Vasquez 02/28/2024, 1:41 PM

## 2024-02-29 LAB — COMPREHENSIVE METABOLIC PANEL WITH GFR
ALT: 35 U/L (ref 0–44)
AST: 29 U/L (ref 15–41)
Albumin: 4.1 g/dL (ref 3.5–5.0)
Alkaline Phosphatase: 66 U/L (ref 38–126)
Anion gap: 11 (ref 5–15)
BUN: 9 mg/dL (ref 8–23)
CO2: 29 mmol/L (ref 22–32)
Calcium: 9.3 mg/dL (ref 8.9–10.3)
Chloride: 103 mmol/L (ref 98–111)
Creatinine, Ser: 0.7 mg/dL (ref 0.61–1.24)
GFR, Estimated: >60 mL/min
Glucose, Bld: 110 mg/dL — ABNORMAL HIGH (ref 70–99)
Potassium: 3.7 mmol/L (ref 3.5–5.1)
Sodium: 143 mmol/L (ref 135–145)
Total Bilirubin: 0.2 mg/dL (ref 0.0–1.2)
Total Protein: 6.9 g/dL (ref 6.5–8.1)

## 2024-02-29 LAB — CBC WITH DIFFERENTIAL/PLATELET
Abs Immature Granulocytes: 0.02 10*3/uL (ref 0.00–0.07)
Basophils Absolute: 0 10*3/uL (ref 0.0–0.1)
Basophils Relative: 1 %
Eosinophils Absolute: 0.5 10*3/uL (ref 0.0–0.5)
Eosinophils Relative: 8 %
HCT: 35.2 % — ABNORMAL LOW (ref 39.0–52.0)
Hemoglobin: 11.7 g/dL — ABNORMAL LOW (ref 13.0–17.0)
Immature Granulocytes: 0 %
Lymphocytes Relative: 23 %
Lymphs Abs: 1.5 10*3/uL (ref 0.7–4.0)
MCH: 31 pg (ref 26.0–34.0)
MCHC: 33.2 g/dL (ref 30.0–36.0)
MCV: 93.4 fL (ref 80.0–100.0)
Monocytes Absolute: 0.6 10*3/uL (ref 0.1–1.0)
Monocytes Relative: 10 %
Neutro Abs: 3.8 10*3/uL (ref 1.7–7.7)
Neutrophils Relative %: 58 %
Platelets: 230 10*3/uL (ref 150–400)
RBC: 3.77 MIL/uL — ABNORMAL LOW (ref 4.22–5.81)
RDW: 12.7 % (ref 11.5–15.5)
WBC: 6.4 10*3/uL (ref 4.0–10.5)
nRBC: 0 % (ref 0.0–0.2)

## 2024-02-29 MED ORDER — OLANZAPINE 5 MG PO TABS
5.0000 mg | ORAL_TABLET | Freq: Every day | ORAL | Status: DC
  Administered 2024-03-01: 5 mg via ORAL
  Filled 2024-02-29: qty 1

## 2024-02-29 MED ORDER — BUPRENORPHINE HCL-NALOXONE HCL 8-2 MG SL SUBL: 1.0000 | SUBLINGUAL_TABLET | Freq: Every day | SUBLINGUAL | Status: DC

## 2024-02-29 MED ORDER — OLANZAPINE 10 MG PO TABS
10.0000 mg | ORAL_TABLET | Freq: Every day | ORAL | Status: DC
  Administered 2024-03-01: 10 mg via ORAL
  Filled 2024-02-29: qty 1

## 2024-02-29 MED ORDER — QUETIAPINE FUMARATE 25 MG PO TABS
50.0000 mg | ORAL_TABLET | Freq: Every day | ORAL | Status: DC
  Administered 2024-02-29: 50 mg via ORAL
  Filled 2024-02-29: qty 2

## 2024-02-29 MED ORDER — AMANTADINE HCL 100 MG PO CAPS
100.0000 mg | ORAL_CAPSULE | Freq: Two times a day (BID) | ORAL | Status: DC
  Administered 2024-02-29 – 2024-03-01 (×3): 100 mg via ORAL
  Filled 2024-02-29 (×4): qty 1

## 2024-02-29 NOTE — Plan of Care (Signed)
   Problem: Education: Goal: Knowledge of Richard Vasquez General Education information/materials will improve Outcome: Progressing Goal: Emotional status will improve Outcome: Progressing Goal: Mental status will improve Outcome: Progressing Goal: Verbalization of understanding the information provided will improve Outcome: Progressing   Problem: Activity: Goal: Interest or engagement in activities will improve Outcome: Progressing Goal: Sleeping patterns will improve Outcome: Progressing   Problem: Coping: Goal: Ability to verbalize frustrations and anger appropriately will improve Outcome: Progressing Goal: Ability to demonstrate self-control will improve Outcome: Progressing

## 2024-02-29 NOTE — Progress Notes (Signed)
" °   02/29/24 1700  Psych Admission Type (Psych Patients Only)  Admission Status Involuntary  Psychosocial Assessment  Patient Complaints None  Eye Contact Fair  Facial Expression Flat  Affect Appropriate to circumstance  Speech Logical/coherent  Interaction Assertive  Motor Activity Slow  Appearance/Hygiene Unremarkable  Behavior Characteristics Appropriate to situation  Mood Preoccupied  Thought Process  Coherency WDL  Content WDL  Delusions None reported or observed  Perception WDL  Hallucination None reported or observed  Judgment Impaired  Confusion WDL  Danger to Self  Current suicidal ideation? Denies  Danger to Others  Danger to Others None reported or observed    "

## 2024-02-29 NOTE — Progress Notes (Signed)
" °   02/29/24 0000  Psych Admission Type (Psych Patients Only)  Admission Status Involuntary  Psychosocial Assessment  Patient Complaints None  Eye Contact Fair  Facial Expression Flat  Affect Appropriate to circumstance  Speech Logical/coherent  Interaction Assertive  Motor Activity Slow  Appearance/Hygiene Unremarkable  Behavior Characteristics Appropriate to situation  Mood Preoccupied  Aggressive Behavior  Effect No apparent injury  Thought Process  Coherency WDL  Content WDL  Delusions None reported or observed  Perception WDL  Hallucination None reported or observed  Judgment Impaired  Confusion WDL  Danger to Self  Current suicidal ideation? Denies  Danger to Others  Danger to Others None reported or observed    "
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
## 2024-02-29 NOTE — Plan of Care (Signed)
  Problem: Education: Goal: Knowledge of [facility] Education information/materials will improve Outcome: Progressing Goal: Emotional status will improve Outcome: Progressing Goal: Mental status will improve Outcome: Progressing Goal: Verbalization of understanding the information provided will improve Outcome: Progressing   Problem: Activity: Goal: Interest or engagement in activities will improve Outcome: Progressing Goal: Sleeping patterns will improve Outcome: Progressing   Problem: Coping: Goal: Ability to verbalize frustrations and anger appropriately will improve Outcome: Progressing Goal: Ability to demonstrate self-control will improve Outcome: Progressing   Problem: Health Behavior/Discharge Planning: Goal: Identification of resources available to assist in meeting health care needs will improve Outcome: Progressing Goal: Compliance with treatment plan for underlying cause of condition will improve Outcome: Progressing   Problem: Physical Regulation: Goal: Ability to maintain clinical measurements within normal limits will improve Outcome: Progressing   Problem: Safety: Goal: Periods of time without injury will increase Outcome: Progressing   Problem: Education: Goal: Ability to state activities that reduce stress will improve Outcome: Progressing   Problem: Coping: Goal: Ability to identify and develop effective coping behavior will improve Outcome: Progressing   Problem: Self-Concept: Goal: Ability to identify factors that promote anxiety will improve Outcome: Progressing Goal: Level of anxiety will decrease Outcome: Progressing Goal: Ability to modify response to factors that promote anxiety will improve Outcome: Progressing   Problem: Safety: Goal: Violent Restraint(s) Outcome: Progressing

## 2024-02-29 NOTE — Group Note (Signed)
 Date:  02/29/2024 Time:  7:15 PM  Group Topic/Focus:  Coping With Mental Health Crisis:   The purpose of this group is to help patients identify strategies for coping with mental health crisis.  Group discusses possible causes of crisis and ways to manage them effectively. Healthy Communication:   The focus of this group is to discuss communication, barriers to communication, as well as healthy ways to communicate with others.  A group session was conducted with patients utilizing the activity Two Truths and One Lie to promote peer interaction and allow both patients and facilitator to become more familiar with one another. Following this activity, patients participated in an arts and crafts intervention involving the creation of viral nursing glove dolls. Patients demonstrated enjoyment and engagement throughout the activity. This intervention provided an opportunity for patients to use a simple and accessible coping mechanism to redirect focus and promote mindfulness. Materials used were minimal, including one glove, a mask, and a marker. Patients appeared excited during the process and expressed a sense of accomplishment upon completing the activity.  Participation Level:  Did Not Attend  Participation Quality:    Affect:    Cognitive:    Insight:   Engagement in Group:    Modes of Intervention:    Additional Comments:    Richard Vasquez L Richard Vasquez 02/29/2024, 7:15 PM

## 2024-02-29 NOTE — Progress Notes (Signed)
 Spoke with the pharmacist regarding wound care supplies; pharmacist stated that Manuka honey would not be used in conjunction with Santyl  but rather as an alternative treatment. Not all dressing supplies were available for use at this time. Following his shower, the patient declined wound care, stating that his wound is improving and he does not feel it is needed today. Patient was educated on the importance of consistent wound care and verbalized understanding. Will continue to monitor and reattempt as appropriate.

## 2024-02-29 NOTE — Group Note (Signed)
 Date:  02/29/2024 Time:  8:50 PM  Group Topic/Focus:  Managing Feelings:   The focus of this group is to identify what feelings patients have difficulty handling and develop a plan to handle them in a healthier way upon discharge. Self Care:   The focus of this group is to help patients understand the importance of self-care in order to improve or restore emotional, physical, spiritual, interpersonal, and financial health. Wrap-Up Group:   The focus of this group is to help patients review their daily goal of treatment and discuss progress on daily workbooks.    Participation Level:  Active  Participation Quality:  Appropriate and Attentive  Affect:  Appropriate  Cognitive:  Alert, Appropriate, and Oriented  Insight: Appropriate and Good  Engagement in Group:  Engaged  Modes of Intervention:  Discussion and Support  Additional Comments:  N/A  Butler LITTIE Gelineau 02/29/2024, 8:50 PM

## 2024-03-01 ENCOUNTER — Other Ambulatory Visit: Payer: Self-pay

## 2024-03-01 MED ORDER — QUETIAPINE FUMARATE 25 MG PO TABS
25.0000 mg | ORAL_TABLET | ORAL | Status: DC
  Administered 2024-03-01: 25 mg via ORAL
  Filled 2024-03-01: qty 1

## 2024-03-01 MED ORDER — TRAZODONE HCL 50 MG PO TABS
50.0000 mg | ORAL_TABLET | Freq: Every evening | ORAL | 0 refills | Status: AC | PRN
  Filled 2024-03-01: qty 30, 30d supply, fill #0

## 2024-03-01 MED ORDER — BUPRENORPHINE HCL-NALOXONE HCL 8-2 MG SL SUBL
1.0000 | SUBLINGUAL_TABLET | Freq: Every day | SUBLINGUAL | 0 refills | Status: AC
  Filled 2024-03-01: qty 7, 7d supply, fill #0

## 2024-03-01 MED ORDER — OLANZAPINE 10 MG PO TABS
10.0000 mg | ORAL_TABLET | Freq: Every day | ORAL | 0 refills | Status: AC
  Filled 2024-03-01: qty 30, 30d supply, fill #0

## 2024-03-01 MED ORDER — AMANTADINE HCL 100 MG PO CAPS
100.0000 mg | ORAL_CAPSULE | Freq: Two times a day (BID) | ORAL | 0 refills | Status: AC
  Filled 2024-03-01: qty 40, 20d supply, fill #0

## 2024-03-01 MED ORDER — SERTRALINE HCL 50 MG PO TABS
50.0000 mg | ORAL_TABLET | Freq: Every day | ORAL | 0 refills | Status: AC
  Filled 2024-03-01: qty 30, 30d supply, fill #0

## 2024-03-01 MED ORDER — COLLAGENASE 250 UNIT/GM EX OINT
1.0000 | TOPICAL_OINTMENT | Freq: Every day | CUTANEOUS | Status: DC
  Filled 2024-03-01: qty 30

## 2024-03-01 MED ORDER — QUETIAPINE FUMARATE 25 MG PO TABS
25.0000 mg | ORAL_TABLET | Freq: Two times a day (BID) | ORAL | 0 refills | Status: AC
  Filled 2024-03-01: qty 60, 30d supply, fill #0

## 2024-03-01 MED ORDER — OLANZAPINE 5 MG PO TABS
5.0000 mg | ORAL_TABLET | Freq: Every day | ORAL | 0 refills | Status: AC
  Filled 2024-03-01: qty 30, 30d supply, fill #0

## 2024-03-01 MED ORDER — SERTRALINE HCL 25 MG PO TABS: 50.0000 mg | ORAL_TABLET | Freq: Every day | ORAL | Status: DC

## 2024-03-01 MED ORDER — MEDIHONEY WOUND/BURN DRESSING EX PSTE
1.0000 | PASTE | Freq: Every day | CUTANEOUS | 0 refills | Status: AC
  Filled 2024-03-01: qty 30, 30d supply, fill #0

## 2024-03-01 MED ORDER — ENOXAPARIN SODIUM 40 MG/0.4ML IJ SOSY
40.0000 mg | PREFILLED_SYRINGE | INTRAMUSCULAR | 0 refills | Status: AC
  Filled 2024-03-01: qty 12, 30d supply, fill #0

## 2024-03-01 MED ORDER — QUETIAPINE FUMARATE 200 MG PO TABS
200.0000 mg | ORAL_TABLET | Freq: Every day | ORAL | 0 refills | Status: AC
  Filled 2024-03-01: qty 30, 30d supply, fill #0

## 2024-03-01 MED ORDER — GABAPENTIN 100 MG PO CAPS
100.0000 mg | ORAL_CAPSULE | Freq: Three times a day (TID) | ORAL | 0 refills | Status: AC
  Filled 2024-03-01: qty 90, 30d supply, fill #0

## 2024-03-01 NOTE — Progress Notes (Signed)
" °   03/01/24 1117  Psych Admission Type (Psych Patients Only)  Admission Status Involuntary  Psychosocial Assessment  Patient Complaints None  Eye Contact Fair  Facial Expression Flat  Affect Appropriate to circumstance  Speech Logical/coherent  Interaction Assertive  Motor Activity Slow  Appearance/Hygiene Unremarkable;In scrubs  Behavior Characteristics Appropriate to situation;Cooperative  Mood Pleasant  Aggressive Behavior  Effect No apparent injury  Thought Process  Coherency WDL  Content WDL  Delusions None reported or observed  Perception WDL  Hallucination None reported or observed  Judgment Impaired  Confusion WDL  Danger to Self  Current suicidal ideation? Denies  Danger to Others  Danger to Others None reported or observed    "

## 2024-03-01 NOTE — Group Note (Signed)
 Recreation Therapy Group Note   Group Topic:General Recreation  Group Date: 03/01/2024 Start Time: 1530 End Time: 1620 Facilitators: Celestia Jeoffrey FORBES ARTICE, CTRS Location: Craft Room  Group Description: Bingo. Patients played multiple rounds of bingo. LRT and pts discussed the definition of leisure, things they do in their free time outside of the hospital, and how bingo is also a leisure activity. Pts received a coloring book, word search book, or journal as a prize.    Goal Area(s) Addressed:  Patient will identify a current leisure interest.  Patient will learn the definition of leisure. Patient will have the opportunity to try a new leisure activity. Patient will communicate with peers and LRT.   Affect/Mood: Appropriate   Participation Level: Minimal    Clinical Observations/Individualized Feedback: Richard Vasquez came to group late and left early. Pt was present for 10 minutes.   Plan: Continue to engage patient in RT group sessions 2-3x/week.   Jeoffrey FORBES Celestia, LRT, CTRS 03/01/2024 5:13 PM

## 2024-03-01 NOTE — Plan of Care (Signed)
   Problem: Education: Goal: Knowledge of Richard Vasquez General Education information/materials will improve Outcome: Progressing Goal: Emotional status will improve Outcome: Progressing Goal: Mental status will improve Outcome: Progressing Goal: Verbalization of understanding the information provided will improve Outcome: Progressing   Problem: Activity: Goal: Interest or engagement in activities will improve Outcome: Progressing Goal: Sleeping patterns will improve Outcome: Progressing   Problem: Coping: Goal: Ability to verbalize frustrations and anger appropriately will improve Outcome: Progressing Goal: Ability to demonstrate self-control will improve Outcome: Progressing

## 2024-03-01 NOTE — Group Note (Signed)
 Date:  03/01/2024 Time:  8:44 PM  Group Topic/Focus:  Orientation:   The focus of this group is to educate the patient on the purpose and policies of crisis stabilization and provide a format to answer questions about their admission.  The group details unit policies and expectations of patients while admitted. Wrap-Up Group:   The focus of this group is to help patients review their daily goal of treatment and discuss progress on daily workbooks.    Participation Level:  Active  Participation Quality:  Appropriate and Attentive  Affect:  Appropriate  Cognitive:  Alert and Appropriate  Insight: Appropriate and Good  Engagement in Group:  Engaged  Modes of Intervention:  Orientation  Additional Comments:     Arlester CHRISTELLA Servant 03/01/2024, 8:44 PM

## 2024-03-01 NOTE — Group Note (Signed)
 Recreation Therapy Group Note   Group Topic:Self-Esteem  Group Date: 03/01/2024 Start Time: 1100 End Time: 1140 Facilitators: Celestia Jeoffrey BRAVO, LRT, CTRS Location: Craft [facility]  Group Description: Positive Affirmation Worksheet. Patients and LRT discussed the importance of self-love/self-esteem and things that cause it to fluctuate, including our mental health. Patients completed a worksheet that helps them identify 24 different strengths and qualities about themselves. Pt encouraged to read aloud at least 3 off their sheet to the group. LRT and pts discussed how this can be applied to daily life post-discharge.   Goal Area(s) Addressed: Patient will identify positive qualities about themselves. Patient will learn new positive affirmations.  Patient will recite positive qualities and affirmations aloud to the group.  Patient will practice positive self-talk.  Patient will increase communication.   Affect/Mood: N/A   Participation Level: Did not attend    Clinical Observations/Individualized Feedback: Patient did not attend.  Plan: Continue to engage patient in RT group sessions 2-3x/week.   Jeoffrey BRAVO Celestia, LRT, CTRS 03/01/2024 11:54 AM

## 2024-03-01 NOTE — Plan of Care (Signed)
   Problem: Education: Goal: Knowledge of [facility] Education information/materials will improve Outcome: Progressing Goal: Emotional status will improve Outcome: Progressing Goal: Mental status will improve Outcome: Progressing Goal: Verbalization of understanding the information provided will improve Outcome: Progressing

## 2024-03-02 ENCOUNTER — Telehealth: Payer: Self-pay

## 2024-03-02 NOTE — Progress Notes (Signed)
" °   03/02/24 0805  Psych Admission Type (Psych Patients Only)  Admission Status Involuntary  Psychosocial Assessment  Patient Complaints None  Eye Contact Fair  Facial Expression Other (Comment) (WNL)  Affect Appropriate to circumstance  Speech Logical/coherent  Interaction Assertive  Motor Activity Slow  Appearance/Hygiene Unremarkable  Behavior Characteristics Appropriate to situation  Mood Pleasant  Thought Process  Coherency WDL  Content WDL  Delusions None reported or observed  Perception WDL  Hallucination None reported or observed  Judgment Impaired  Confusion WDL  Danger to Self  Current suicidal ideation? Denies  Danger to Others  Danger to Others None reported or observed    "

## 2024-03-02 NOTE — Progress Notes (Addendum)
 Patient belongings returned. Patient refused to take his meds this morning stating that he will take them when he gets home. Meds provided from pharmacy was provided to the patient. Patient walked to medical mall by by [facility]

## 2024-03-02 NOTE — Plan of Care (Signed)
  Problem: Education: Goal: Knowledge of [facility] Education information/materials will improve Outcome: Progressing Goal: Emotional status will improve Outcome: Progressing Goal: Mental status will improve Outcome: Progressing Goal: Verbalization of understanding the information provided will improve Outcome: Progressing   Problem: Activity: Goal: Interest or engagement in activities will improve Outcome: Progressing Goal: Sleeping patterns will improve Outcome: Progressing   Problem: Coping: Goal: Ability to verbalize frustrations and anger appropriately will improve Outcome: Progressing Goal: Ability to demonstrate self-control will improve Outcome: Progressing   Problem: Health Behavior/Discharge Planning: Goal: Identification of resources available to assist in meeting health care needs will improve Outcome: Progressing Goal: Compliance with treatment plan for underlying cause of condition will improve Outcome: Progressing   Problem: Physical Regulation: Goal: Ability to maintain clinical measurements within normal limits will improve Outcome: Progressing   Problem: Safety: Goal: Periods of time without injury will increase Outcome: Progressing   Problem: Education: Goal: Ability to state activities that reduce stress will improve Outcome: Progressing   Problem: Coping: Goal: Ability to identify and develop effective coping behavior will improve Outcome: Progressing   Problem: Self-Concept: Goal: Ability to identify factors that promote anxiety will improve Outcome: Progressing Goal: Level of anxiety will decrease Outcome: Progressing Goal: Ability to modify response to factors that promote anxiety will improve Outcome: Progressing   Problem: Safety: Goal: Violent Restraint(s) Outcome: Progressing
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
## 2024-03-04 ENCOUNTER — Inpatient Hospital Stay
Admission: EM | Admit: 2024-03-04 | Payer: MEDICAID | Source: Home / Self Care | Attending: Emergency Medicine | Admitting: Emergency Medicine

## 2024-03-04 ENCOUNTER — Other Ambulatory Visit: Payer: Self-pay

## 2024-03-04 DIAGNOSIS — T50902A Poisoning by unspecified drugs, medicaments and biological substances, intentional self-harm, initial encounter: Principal | ICD-10-CM | POA: Diagnosis present

## 2024-03-04 DIAGNOSIS — J4520 Mild intermittent asthma, uncomplicated: Secondary | ICD-10-CM

## 2024-03-04 DIAGNOSIS — T43501A Poisoning by unspecified antipsychotics and neuroleptics, accidental (unintentional), initial encounter: Secondary | ICD-10-CM | POA: Diagnosis present

## 2024-03-04 DIAGNOSIS — J4489 Other specified chronic obstructive pulmonary disease: Secondary | ICD-10-CM

## 2024-03-04 DIAGNOSIS — K2100 Gastro-esophageal reflux disease with esophagitis, without bleeding: Secondary | ICD-10-CM

## 2024-03-04 LAB — COMPREHENSIVE METABOLIC PANEL WITH GFR
ALT: 10 U/L (ref 0–44)
ALT: 11 U/L (ref 0–44)
AST: 17 U/L (ref 15–41)
AST: 13 U/L — ABNORMAL LOW (ref 15–41)
Albumin: 3.7 g/dL (ref 3.5–5.0)
Albumin: 2.8 g/dL — ABNORMAL LOW (ref 3.5–5.0)
Alkaline Phosphatase: 62 U/L (ref 38–126)
Alkaline Phosphatase: 45 U/L (ref 38–126)
Anion gap: 14 (ref 5–15)
Anion gap: 9 (ref 5–15)
BUN: 11 mg/dL (ref 8–23)
BUN: 9 mg/dL (ref 8–23)
CO2: 23 mmol/L (ref 22–32)
CO2: 22 mmol/L (ref 22–32)
Calcium: 8.7 mg/dL — ABNORMAL LOW (ref 8.9–10.3)
Calcium: 6.7 mg/dL — ABNORMAL LOW (ref 8.9–10.3)
Chloride: 104 mmol/L (ref 98–111)
Chloride: 115 mmol/L — ABNORMAL HIGH (ref 98–111)
Creatinine, Ser: 0.76 mg/dL (ref 0.61–1.24)
Creatinine, Ser: 0.54 mg/dL — ABNORMAL LOW (ref 0.61–1.24)
GFR, Estimated: >60 mL/min
GFR, Estimated: >60 mL/min
Glucose, Bld: 178 mg/dL — ABNORMAL HIGH (ref 70–99)
Glucose, Bld: 84 mg/dL (ref 70–99)
Potassium: 3.4 mmol/L — ABNORMAL LOW (ref 3.5–5.1)
Potassium: 2.9 mmol/L — ABNORMAL LOW (ref 3.5–5.1)
Sodium: 140 mmol/L (ref 135–145)
Sodium: 145 mmol/L (ref 135–145)
Total Bilirubin: <0.2 mg/dL (ref 0.0–1.2)
Total Bilirubin: <0.2 mg/dL (ref 0.0–1.2)
Total Protein: 6.2 g/dL — ABNORMAL LOW (ref 6.5–8.1)
Total Protein: 4.4 g/dL — ABNORMAL LOW (ref 6.5–8.1)

## 2024-03-04 LAB — CBC
HCT: 32.9 % — ABNORMAL LOW (ref 39.0–52.0)
Hemoglobin: 11 g/dL — ABNORMAL LOW (ref 13.0–17.0)
MCH: 31.1 pg (ref 26.0–34.0)
MCHC: 33.4 g/dL (ref 30.0–36.0)
MCV: 92.9 fL (ref 80.0–100.0)
Platelets: 246 10*3/uL (ref 150–400)
RBC: 3.54 MIL/uL — ABNORMAL LOW (ref 4.22–5.81)
RDW: 12.5 % (ref 11.5–15.5)
WBC: 8.6 10*3/uL (ref 4.0–10.5)
nRBC: 0 % (ref 0.0–0.2)

## 2024-03-04 LAB — SALICYLATE LEVEL: Salicylate Lvl: <7 mg/dL — ABNORMAL LOW (ref 7.0–30.0)

## 2024-03-04 LAB — ACETAMINOPHEN LEVEL
Acetaminophen (Tylenol), Serum: <10 ug/mL — ABNORMAL LOW (ref 10–30)
Acetaminophen (Tylenol), Serum: <10 ug/mL — ABNORMAL LOW (ref 10–30)

## 2024-03-04 LAB — MAGNESIUM: Magnesium: 1.7 mg/dL (ref 1.7–2.4)

## 2024-03-04 LAB — ETHANOL: Alcohol, Ethyl (B): <15 mg/dL

## 2024-03-04 MED ORDER — SODIUM BICARBONATE 8.4 % IV SOLN
50.0000 meq | Freq: Once | INTRAVENOUS | Status: AC
  Administered 2024-03-04: 50 meq via INTRAVENOUS
  Filled 2024-03-04: qty 50

## 2024-03-04 MED ORDER — LACTATED RINGERS IV BOLUS
1000.0000 mL | Freq: Once | INTRAVENOUS | Status: AC
  Administered 2024-03-04: 1000 mL via INTRAVENOUS

## 2024-03-04 MED ORDER — SODIUM CHLORIDE 0.9 % IV BOLUS
1000.0000 mL | Freq: Once | INTRAVENOUS | Status: AC
  Administered 2024-03-04: 1000 mL via INTRAVENOUS

## 2024-03-04 NOTE — ED Triage Notes (Signed)
 Pt arrives via EMS from home. Pt told his wife that he took 100 tablets of Seroquel  with intent to kill himself about 1 hour ago. Upon EMS arrival, patient has been drowsy but oriented. Initial systolic was in the 70s. EMS administered 750mL NS in route. Pt arrives AxOx4. Pt reports chest pain, sob, and nausea.   BG 193 BP: 98/55 HR:133 Spo2:96% 4Lnc RR:17

## 2024-03-04 NOTE — ED Notes (Signed)
 Patients wife and patient state that he was not intending on harming himself. Wife endorses that she handles his medication administration due to some confusion and believes he took too many by mistake. Patient is lethargic and unable to speak on his own account Psych will follow up later

## 2024-03-04 NOTE — BH Assessment (Signed)
 This writer attempted to assess patient but does not appear to be medically cleared, he is presenting with his wife and other family member. Patient's wife had some difficulty waking patient up and his speech is currently slurred. Patient's wife reports that she does not believe he intentionally tried to hurt himself I think he just got confused, I usually put the number of his medications aside for him to take, he saw that I was trying to accomplish a lot and I think he was trying to help me out by taking his medications himself, I think he just got confused. Psyc team to assess when patient is medically cleared and able to participate in assessment fully.   Update: 2am 03/05/24- Attempt was made again to assess patient but patient is currently sleeping and difficult to arouse. Psyc team to assess when he is alert and oriented

## 2024-03-04 NOTE — ED Provider Notes (Signed)
 "  [facility] Provider Note    Event Date/Time   First MD Initiated Contact with Patient 03/04/24 1856     (approximate)  History   Chief Complaint: Ingestion  HPI  Richard Vasquez is a 64 y.o. male with a past medical history anxiety, depression, schizophrenia, presents emergency department after an intentional overdose.  According to the patient he was just recently discharged with new prescription of Seroquel .  Patient states he took the entire bottle he estimates approximately 100 tablets of Seroquel .  He told his wife this.  Patient does admit that he vomited shortly after taking the medication but he is not sure how much the medicine stay down.  EMS states on arrival patient found to be quite hypotensive systolic around 70 and tachycardic around 130 to 140 bpm.  Here patient is awake alert oriented he is able to give a good history and admits to taking the pills.  When asked if he took them to hurt himself he says apparently.  Physical Exam   Triage Vital Signs: ED Triage Vitals  Encounter Vitals Group     BP      Girls Systolic BP Percentile      Girls Diastolic BP Percentile      Boys Systolic BP Percentile      Boys Diastolic BP Percentile      Pulse      Resp      Temp      Temp src      SpO2      Weight      Height      Head Circumference      Peak Flow      Pain Score      Pain Loc      Pain Education      Exclude from Growth Chart     Most recent vital signs: There were no vitals filed for this visit.  General: Awake, no distress.  CV:  Good peripheral perfusion.  Regular rhythm rate around 130 bpm. Resp:  Normal effort.  Equal breath sounds bilaterally.  Abd:  No distention.  Soft, nontender.  No rebound or guarding.  Good lung sounds bilaterally.   ED Results / Procedures / Treatments   EKG  EKG viewed and interpreted by myself shows atrial flutter around 127 bpm patient has a widened QRS consistent with a left bundle  branch block.  I reviewed patient's prior EKG in which he had a left lower branch block as well.  MEDICATIONS ORDERED IN ED: Medications - No data to display   IMPRESSION / MDM / ASSESSMENT AND PLAN / ED COURSE  I reviewed the triage vital signs and the nursing notes.  Patient's presentation is most consistent with acute presentation with potential threat to life or bodily function.  Patient presents to the emergency department after intentional overdose of Seroquel .  Patient estimates approximate 100 tablets of Seroquel .  Will have the nurse contact poison control for further recommendations.  Patient is hypotensive with EMS we will dose 1 L of IV fluids.  Will continue to closely monitor while awaiting further results and labs.  Will place the patient under an IVC while awaiting for the results.  ----------------------------------------- 9:26 PM on 03/04/2024 ----------------------------------------- Patient is quite somnolent, will rouse to moderate physical stimulation.  Patient patient's blood pressure is downtrending.  Will dose a second liter of IV fluids.  Patient's lab work shows a reassuring CBC reassuring chemistry normal salicylate acetaminophen   and alcohol levels.  ----------------------------------------- 11:08 PM on 03/04/2024 ----------------------------------------- Blood pressure improved currently 108 systolic.  Repeat acetaminophen  magnesium  and chemistry pending.  Will continue to monitor in the emergency department for total of 8 hours at which time as long as the patient continues to appear well he will be medically cleared for psychiatric disposition.  Remains under an IVC for possible suicide attempt.  CRITICAL CARE Performed by: Franky Moores   Total critical care time: 30 minutes  Critical care time was exclusive of separately billable procedures and treating other patients.  Critical care was necessary to treat or prevent imminent or life-threatening  deterioration.  Critical care was time spent personally by me on the following activities: development of treatment plan with patient and/or surrogate as well as nursing, discussions with consultants, evaluation of patient's response to treatment, examination of patient, obtaining history from patient or surrogate, ordering and performing treatments and interventions, ordering and review of laboratory studies, ordering and review of radiographic studies, pulse oximetry and re-evaluation of patient's condition.   FINAL CLINICAL IMPRESSION(S) / ED DIAGNOSES   Overdose    Note:  This document was prepared using Dragon voice recognition software and may include unintentional dictation errors.   Moores Franky, MD 03/04/24 2309  "

## 2024-03-05 ENCOUNTER — Inpatient Hospital Stay: Payer: MEDICAID

## 2024-03-05 ENCOUNTER — Other Ambulatory Visit: Payer: Self-pay

## 2024-03-05 ENCOUNTER — Encounter: Payer: Self-pay | Admitting: Internal Medicine

## 2024-03-05 ENCOUNTER — Emergency Department: Payer: MEDICAID

## 2024-03-05 DIAGNOSIS — T43501A Poisoning by unspecified antipsychotics and neuroleptics, accidental (unintentional), initial encounter: Secondary | ICD-10-CM | POA: Diagnosis present

## 2024-03-05 LAB — URINE DRUG SCREEN
Amphetamines: NEGATIVE
Barbiturates: NEGATIVE
Benzodiazepines: POSITIVE — AB
Cocaine: NEGATIVE
Fentanyl: NEGATIVE
Methadone Scn, Ur: POSITIVE — AB
Opiates: NEGATIVE
Tetrahydrocannabinol: NEGATIVE

## 2024-03-05 LAB — BASIC METABOLIC PANEL WITH GFR
Anion gap: 10 (ref 5–15)
BUN: 9 mg/dL (ref 8–23)
CO2: 22 mmol/L (ref 22–32)
Calcium: 8.4 mg/dL — ABNORMAL LOW (ref 8.9–10.3)
Chloride: 110 mmol/L (ref 98–111)
Creatinine, Ser: 0.68 mg/dL (ref 0.61–1.24)
GFR, Estimated: >60 mL/min
Glucose, Bld: 126 mg/dL — ABNORMAL HIGH (ref 70–99)
Potassium: 4 mmol/L (ref 3.5–5.1)
Sodium: 142 mmol/L (ref 135–145)

## 2024-03-05 LAB — TROPONIN T, HIGH SENSITIVITY: Troponin T High Sensitivity: 19 ng/L (ref 0–19)

## 2024-03-05 LAB — MAGNESIUM: Magnesium: 2.6 mg/dL — ABNORMAL HIGH (ref 1.7–2.4)

## 2024-03-05 LAB — CK: Total CK: 60 U/L (ref 49–397)

## 2024-03-05 MED ORDER — POTASSIUM CHLORIDE 20 MEQ PO PACK
40.0000 meq | PACK | Freq: Once | ORAL | Status: DC
  Filled 2024-03-05: qty 2

## 2024-03-05 MED ORDER — FLUTICASONE FUROATE-VILANTEROL 100-25 MCG/ACT IN AEPB: 1.0000 | INHALATION_SPRAY | Freq: Every day | RESPIRATORY_TRACT | Status: DC

## 2024-03-05 MED ORDER — SERTRALINE HCL 50 MG PO TABS: 25.0000 mg | ORAL_TABLET | Freq: Every day | ORAL | Status: AC

## 2024-03-05 MED ORDER — ACETAMINOPHEN 650 MG RE SUPP: 650.0000 mg | Freq: Four times a day (QID) | RECTAL | Status: AC | PRN

## 2024-03-05 MED ORDER — MAGNESIUM OXIDE -MG SUPPLEMENT 400 (240 MG) MG PO TABS: 400.0000 mg | ORAL_TABLET | Freq: Once | ORAL | Status: DC

## 2024-03-05 MED ORDER — POTASSIUM CHLORIDE 10 MEQ/100ML IV SOLN
10.0000 meq | INTRAVENOUS | Status: AC
  Administered 2024-03-05 (×3): 10 meq via INTRAVENOUS
  Filled 2024-03-05 (×3): qty 100

## 2024-03-05 MED ORDER — METOCLOPRAMIDE HCL 5 MG/ML IJ SOLN: 5.0000 mg | Freq: Four times a day (QID) | INTRAMUSCULAR | Status: AC | PRN

## 2024-03-05 MED ORDER — GABAPENTIN 100 MG PO CAPS
100.0000 mg | ORAL_CAPSULE | Freq: Three times a day (TID) | ORAL | Status: AC
  Administered 2024-03-05: 100 mg via ORAL
  Filled 2024-03-05: qty 1

## 2024-03-05 MED ORDER — BUDESON-GLYCOPYRROL-FORMOTEROL 160-9-4.8 MCG/ACT IN AERO
2.0000 | INHALATION_SPRAY | Freq: Two times a day (BID) | RESPIRATORY_TRACT | Status: AC
  Filled 2024-03-05: qty 5.9

## 2024-03-05 MED ORDER — AMANTADINE HCL 100 MG PO CAPS
100.0000 mg | ORAL_CAPSULE | Freq: Two times a day (BID) | ORAL | Status: AC
  Filled 2024-03-05 (×2): qty 1

## 2024-03-05 MED ORDER — BUPRENORPHINE HCL-NALOXONE HCL 8-2 MG SL SUBL: 1.0000 | SUBLINGUAL_TABLET | Freq: Every day | SUBLINGUAL | Status: AC

## 2024-03-05 MED ORDER — SODIUM CHLORIDE 0.9 % IV SOLN: INTRAVENOUS | Status: AC

## 2024-03-05 MED ORDER — POTASSIUM CHLORIDE 10 MEQ/100ML IV SOLN
10.0000 meq | INTRAVENOUS | Status: AC
  Administered 2024-03-05 (×4): 10 meq via INTRAVENOUS
  Filled 2024-03-05 (×4): qty 100

## 2024-03-05 MED ORDER — TRAZODONE HCL 50 MG PO TABS
50.0000 mg | ORAL_TABLET | Freq: Every evening | ORAL | Status: AC | PRN
  Administered 2024-03-05: 50 mg via ORAL
  Filled 2024-03-05: qty 1

## 2024-03-05 MED ORDER — ENOXAPARIN SODIUM 40 MG/0.4ML IJ SOSY: 40.0000 mg | PREFILLED_SYRINGE | INTRAMUSCULAR | Status: AC

## 2024-03-05 MED ORDER — ALBUTEROL SULFATE (2.5 MG/3ML) 0.083% IN NEBU: 2.5000 mg | INHALATION_SOLUTION | Freq: Four times a day (QID) | RESPIRATORY_TRACT | Status: AC | PRN

## 2024-03-05 MED ORDER — SENNOSIDES-DOCUSATE SODIUM 8.6-50 MG PO TABS: 1.0000 | ORAL_TABLET | Freq: Every evening | ORAL | Status: AC | PRN

## 2024-03-05 MED ORDER — POTASSIUM CHLORIDE CRYS ER 20 MEQ PO TBCR: 60.0000 meq | EXTENDED_RELEASE_TABLET | Freq: Once | ORAL | Status: DC

## 2024-03-05 MED ORDER — ACETAMINOPHEN 325 MG PO TABS: 650.0000 mg | ORAL_TABLET | Freq: Four times a day (QID) | ORAL | Status: AC | PRN

## 2024-03-05 MED ORDER — BUPRENORPHINE HCL-NALOXONE HCL 8-2 MG SL SUBL: 1.0000 | SUBLINGUAL_TABLET | Freq: Every day | SUBLINGUAL | Status: DC

## 2024-03-05 MED ORDER — SODIUM CHLORIDE 0.9 % IV SOLN: Freq: Once | INTRAVENOUS | Status: AC

## 2024-03-05 MED ORDER — MAGNESIUM SULFATE 2 GM/50ML IV SOLN
2.0000 g | Freq: Once | INTRAVENOUS | Status: AC
  Administered 2024-03-05: 2 g via INTRAVENOUS
  Filled 2024-03-05: qty 50

## 2024-03-05 MED ORDER — PANTOPRAZOLE SODIUM 40 MG PO TBEC: 40.0000 mg | DELAYED_RELEASE_TABLET | Freq: Every day | ORAL | Status: AC

## 2024-03-05 NOTE — Procedures (Signed)
 Patient Name: Richard Vasquez  MRN: [id number]  Epilepsy Attending: Arlin MALVA Krebs  Referring Physician/Provider: Laurita Cort DASEN, MD  Date: 03/05/2024 Duration: 29.24 mins  Patient history: 64yo M with ams. EEG to evaluate for seizure  Level of alertness: Awake  AEDs during EEG study: GBP  Technical aspects: This EEG study was done with scalp electrodes positioned according to the 10-20 International system of electrode placement. Electrical activity was reviewed with band pass filter of 1-70Hz , sensitivity of 7 uV/mm, display speed of 96mm/sec with a 60Hz  notched filter applied as appropriate. EEG data were recorded continuously and digitally stored.  Video monitoring was available and reviewed as appropriate.  Description: The posterior dominant rhythm consists of 8 Hz activity of moderate voltage (25-35 uV) seen predominantly in posterior head regions, symmetric and reactive to eye opening and eye closing. Hyperventilation and photic stimulation were not performed.     IMPRESSION: This study is within normal limits. No seizures or epileptiform discharges were seen throughout the recording.  A normal interictal EEG does not exclude the diagnosis of epilepsy.   Rudi Knippenberg O Shalese Strahan

## 2024-03-05 NOTE — ED Notes (Signed)
 Pt received call from wife at this time.

## 2024-03-05 NOTE — ED Notes (Signed)
 Pt currently talking to people who are not in the room.

## 2024-03-05 NOTE — ED Notes (Signed)
 Attempted to help pt with oral care per request, but pt was unable to follow directions to spit out the water rinse.

## 2024-03-05 NOTE — ED Provider Notes (Signed)
----------------------------------------- °  8:59 AM on 03/05/2024 ----------------------------------------- Repeat EKGs continue to show wide QRS complexes with prolonged QT, which seems to be a significant change from EKG 1 to 2 weeks ago.  This finding was reviewed with poison control, who recommends additional potassium and magnesium  supplementation as well as repeat labs.  Repeat labs with reassuring potassium and magnesium  levels, otherwise unremarkable.  Patient continues to be altered and somnolent, but arousable to voice with nonfocal neurologic exam.  Given he should have cleared Seroquel  by now, will check CT head.  An unknown friend of the family also contacted the ED to state that we needed to check for arsenic poisoning.  She did not explain her reasoning for this, threatened to call the police if we did not do so, and then ended the phone call without additional information.  This was discussed with poison control, who does not see any indication for heavy metal testing at this time.  Given ongoing altered mental status with abnormal EKG, case discussed with hospitalist for admission.   Willo Dunnings, MD 03/05/24 [phone]

## 2024-03-05 NOTE — Progress Notes (Signed)
 Eeg done

## 2024-03-05 NOTE — ED Notes (Signed)
 Contacted poison control with new EKG. They recommended Collecting BMP. BMP sent down to lab this morning. No further recommendations. MD Jessup notified.

## 2024-03-05 NOTE — ED Notes (Signed)
 This tech was unsuccessful with acquiring EKG at this time.

## 2024-03-05 NOTE — ED Notes (Signed)
 This RN and NT Con changed pt's brief. Peri care performed, Linen changed, new brief applied.

## 2024-03-05 NOTE — H&P (Signed)
 " History and Physical    Richard Vasquez FMW:[id number] DOB: September 03, 1960 DOA: 03/04/2024  PCP: [name] LABOR, FNP (Inactive) (Confirm with patient/family/NH records and if not entered, this has to be entered at [facility] point of entry) Patient coming from: Home  I have personally briefly reviewed patient's old medical records in [facility]  Chief Complaint: Overdose  HPI: Richard Vasquez is a 64 y.o. male with medical history significant of schizophrenia, alcohol abuse, HCV, COPD, chronic back pain, chronic narcotic dependence on Suboxone  therapy, presented with intentional overdose.  Patient is confused and unable to tell me details about overdose.  I went up to call his wife over the phone, wife reported that patient's Seroquel  dosage was adjusted recently, he used to take 50 mg of Seroquel  every morning and 200 mg Seroquel  at night, which was changed to 25 mg Seroquel  twice daily at a time, and has remained on same dose of 200 mg Seroquel  at night.  He however still have 1 bottle of about 150-175 pills of 50 mg left lower Seroquel  from previous prescription.  Wife keeps the 200 mg Seroquel  bottle in a locked box and gave patient the night dose of Seroquel  only at the bedtime.  And last night, patient asked the wife to take the night dose of Seroquel  earlier, when wife told him  It is not the time yet and wife went to have a shower, when wife came back she found patient was with the empty bottle of the 50 mg Seroquel  and she called 911.  Wife reported patient's other psychiatry medications as well as pain medication pillboxes were safe and no overdose signs.  Yesterday daytime, patient appears to be at his baseline as per family and has not expressed any suicidal ideation.  ED Course: Afebrile, tachycardia hypotensive with blood pressure 70/60 oxygen saturation 98% on room air.  Poison control consulted and patient was given IV bicarb, a total of 2 L IV bolus, IV magnesium  x 1.  EKG showed new  onset of LBBB with prolonged QTc 580.  ED physician received a anonymous call from a claimed friend of patient's, reporting that the patient also has arsenic poisoning.  I discussed with wife regarding the issue, wife reported that patient has been living with wife and there is no access to any arsenic containing material as she knows.  Review of Systems: Unable to perform, patient is drowsy and confused.  Past Medical History:  Diagnosis Date   Anxiety    Arthritis    BPH (benign prostatic hyperplasia)    C2 cervical fracture (HCC) 06/12/2018   Chronic pain    Closed displaced supracondylar fracture of distal end of right femur with intracondylar extension (HCC) 06/10/2018   Closed fracture of distal end of right femur (HCC) 06/09/2018   Closed left hip fracture, initial encounter (HCC) 08/18/2018   Depression    Hepatitis C    Paranoid schizophrenia (HCC)    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[facility]    Sleep apnea     Past Surgical History:  Procedure Laterality Date   BALLOON DILATION  03/04/2023   Procedure: BALLOON DILATION;  Surgeon: Jinny Carmine, MD;  Location: [facility] ENDOSCOPY;  Service: Endoscopy;;   BIOPSY  03/04/2023   Procedure: BIOPSY;  Surgeon: Jinny Carmine, MD;  Location: [facility] ENDOSCOPY;  Service: Endoscopy;;   COLONOSCOPY WITH PROPOFOL  N/A 05/18/2020   Procedure: COLONOSCOPY WITH PROPOFOL ;  Surgeon: Jinny Carmine, MD;  Location: [facility] ENDOSCOPY;  Service: Endoscopy;  Laterality: N/A;  ESOPHAGOGASTRODUODENOSCOPY (EGD) WITH PROPOFOL  N/A 03/04/2023   Procedure: ESOPHAGOGASTRODUODENOSCOPY (EGD) WITH PROPOFOL ;  Surgeon: Jinny Carmine, MD;  Location: [facility] ENDOSCOPY;  Service: Endoscopy;  Laterality: N/A;   FRACTURE SURGERY     HIP PINNING,CANNULATED Left 08/18/2018   Procedure: CANNULATED HIP PINNING, Right knee aspiration;  Surgeon: Marchia Drivers, MD;  Location: [facility] ORS;  Service: Orthopedics;  Laterality: Left;   JOINT REPLACEMENT     ORIF FEMUR FRACTURE Right 06/10/2018    Procedure: OPEN REDUCTION INTERNAL FIXATION (ORIF) DISTAL FEMUR FRACTURE;  Surgeon: Kendal Drivers SQUIBB, MD;  Location: [facility] OR;  Service: Orthopedics;  Laterality: Right;   TIBIA IM NAIL INSERTION Left 05/17/2021   Procedure: INTRAMEDULLARY NAILING OF LEFT TIBIA, STRESS EXAM OF PELVIS;  Surgeon: Kendal Drivers SQUIBB, MD;  Location: [facility] OR;  Service: Orthopedics;  Laterality: Left;     reports that he quit smoking about 9 months ago. His smoking use included cigarettes. He started smoking about 23 years ago. He has a 21.1 pack-year smoking history. He has been exposed to tobacco smoke. He has never used smokeless tobacco. He reports that he does not currently use alcohol. He reports that he does not currently use drugs.  Allergies[1]  Family History  Problem Relation Age of Onset   Cancer Mother    Diabetes Mother    Diabetes Paternal Aunt    Lung cancer Paternal Uncle      Prior to Admission medications  Medication Sig Start Date End Date Taking? Authorizing Provider  ADVAIR DISKUS 100-50 MCG/ACT AEPB Inhale 1 puff into the lungs 2 (two) times daily. 05/26/23   [provider]  albuterol  (VENTOLIN  HFA) 108 (90 Base) MCG/ACT inhaler Inhale 2 puffs into the lungs every 6 (six) hours as needed for wheezing or shortness of breath. 03/27/23   Clifton, Kellie A, FNP  amantadine  (SYMMETREL ) 100 MG capsule Take 1 capsule (100 mg total) by mouth 2 (two) times daily. 03/01/24   Jadapalle, Sree, MD  buprenorphine -naloxone  (SUBOXONE ) 8-2 mg SUBL SL tablet Place 1 tablet under the tongue daily for 7 days. 03/02/24 03/09/24  Jadapalle, Sree, MD  collagenase  (SANTYL ) 250 UNIT/GM ointment Apply 1 Application topically daily. 02/27/24   Amin, Sumayya, MD  enoxaparin  (LOVENOX ) 40 MG/0.4ML injection Inject 0.4 mLs (40 mg total) into the skin daily. 03/01/24   Donnelly Mellow, MD  ferrous sulfate  325 (65 FE) MG tablet Take 1 tablet (325 mg total) by mouth daily with breakfast. 02/27/24   Caleen Qualia, MD   Fluticasone -Umeclidin-Vilant (TRELEGY ELLIPTA ) 200-62.5-25 MCG/ACT AEPB Inhale 1 puff into the lungs daily. 08/13/23   Assaker, Darrin, MD  folic acid  (FOLVITE ) 1 MG tablet Take 1 tablet (1 mg total) by mouth daily. 02/27/24   Amin, Sumayya, MD  gabapentin  (NEURONTIN ) 100 MG capsule Take 1 capsule (100 mg total) by mouth 3 (three) times daily. 03/01/24   Jadapalle, Sree, MD  leptospermum manuka honey (MEDIHONEY) PSTE paste Apply 1 Application topically daily. 03/02/24   Jadapalle, Sree, MD  meclizine  (ANTIVERT ) 25 MG tablet Take 1 tablet (25 mg total) by mouth 3 (three) times daily as needed for dizziness. 05/12/23   Suzanne Kirsch, MD  Multiple Vitamin (MULTIVITAMIN WITH MINERALS) TABS tablet Take 1 tablet by mouth daily. 02/27/24   Amin, Sumayya, MD  OLANZapine  (ZYPREXA ) 10 MG tablet Take 1 tablet (10 mg total) by mouth at bedtime. 03/01/24   Donnelly Mellow, MD  OLANZapine  (ZYPREXA ) 5 MG tablet Take 1 tablet (5 mg total) by mouth daily with breakfast. 03/02/24   Jadapalle,  Sree, MD  pantoprazole  (PROTONIX ) 40 MG tablet TAKE 1 TABLET BY MOUTH EVERY DAY 08/12/23   Clifton, Kellie A, FNP  QUEtiapine  (SEROQUEL ) 200 MG tablet Take 1 tablet (200 mg total) by mouth at bedtime. 03/01/24   Jadapalle, Sree, MD  QUEtiapine  (SEROQUEL ) 25 MG tablet Take 1 tablet (25 mg total) by mouth 2 (two) times daily at 8 am and 2 pm. 03/01/24   Donnelly Mellow, MD  rosuvastatin  (CRESTOR ) 40 MG tablet Take 1 tablet (40 mg total) by mouth daily. 12/24/22   Emilio Kelly DASEN, FNP  sertraline  (ZOLOFT ) 50 MG tablet Take 1 tablet (50 mg total) by mouth daily. 03/02/24   Jadapalle, Sree, MD  thiamine  (VITAMIN B-1) 100 MG tablet Take 1 tablet (100 mg total) by mouth daily. 02/27/24   Amin, Sumayya, MD  traZODone  (DESYREL ) 50 MG tablet Take 1 tablet (50 mg total) by mouth at bedtime as needed for sleep. 03/01/24   Donnelly Mellow, MD    Physical Exam: Vitals:   03/05/24 0730 03/05/24 0745 03/05/24 0800 03/05/24 0815  BP: 104/71 107/76 114/74  121/75  Pulse: 100 94 94 94  Resp: 16 14 (!) 22 15  Temp:      TempSrc:      SpO2: 99% 96% 96% 99%    Constitutional: NAD, calm, comfortable Vitals:   03/05/24 0730 03/05/24 0745 03/05/24 0800 03/05/24 0815  BP: 104/71 107/76 114/74 121/75  Pulse: 100 94 94 94  Resp: 16 14 (!) 22 15  Temp:      TempSrc:      SpO2: 99% 96% 96% 99%   Eyes: PERRL, lids and conjunctivae normal ENMT: Mucous membranes are moist. Posterior pharynx clear of any exudate or lesions.Normal dentition.  Neck: normal, supple, no masses, no thyromegaly Respiratory: clear to auscultation bilaterally, no wheezing, no crackles. Normal respiratory effort. No accessory muscle use.  Cardiovascular: Regular rate and rhythm, no murmurs / rubs / gallops. No extremity edema. 2+ pedal pulses. No carotid bruits.  Abdomen: no tenderness, no masses palpated. No hepatosplenomegaly. Bowel sounds positive.  Musculoskeletal: no clubbing / cyanosis. No joint deformity upper and lower extremities. Good ROM, no contractures. Normal muscle tone.  Skin: no rashes, lesions, ulcers. No induration Neurologic: No facial droops, moving all limbs, following simple commands Psychiatric: Awake, drowsy, mumbling to my questions, oriented to himself, confused about time and place.    Labs on Admission: I have personally reviewed following labs and imaging studies  CBC: Recent Labs  Lab 02/29/24 0919 03/04/24 1859  WBC 6.4 8.6  NEUTROABS 3.8  --   HGB 11.7* 11.0*  HCT 35.2* 32.9*  MCV 93.4 92.9  PLT 230 246   Basic Metabolic Panel: Recent Labs  Lab 02/29/24 0919 03/04/24 1859 03/04/24 2247 03/05/24 0736  NA 143 140 145 142  K 3.7 3.4* 2.9* 4.0  CL 103 104 115* 110  CO2 29 23 22 22   GLUCOSE 110* 178* 84 126*  BUN 9 11 9 9   CREATININE 0.70 0.76 0.54* 0.68  CALCIUM  9.3 8.7* 6.7* 8.4*  MG  --   --  1.7 2.6*   GFR: Estimated Creatinine Clearance: 88.4 mL/min (by C-G formula based on SCr of 0.68 mg/dL). Liver Function  Tests: Recent Labs  Lab 02/29/24 0919 03/04/24 1859 03/04/24 2247  AST 29 17 13*  ALT 35 10 11  ALKPHOS 66 62 45  BILITOT 0.2 <0.2 <0.2  PROT 6.9 6.2* 4.4*  ALBUMIN 4.1 3.7 2.8*   No results for input(s): LIPASE,  AMYLASE in the last 168 hours. No results for input(s): AMMONIA in the last 168 hours. Coagulation Profile: No results for input(s): INR, PROTIME in the last 168 hours. Cardiac Enzymes: No results for input(s): CKTOTAL, CKMB, CKMBINDEX, TROPONINI in the last 168 hours. BNP (last 3 results) No results for input(s): PROBNP in the last 8760 hours. HbA1C: No results for input(s): HGBA1C in the last 72 hours. CBG: No results for input(s): GLUCAP in the last 168 hours. Lipid Profile: No results for input(s): CHOL, HDL, LDLCALC, TRIG, CHOLHDL, LDLDIRECT in the last 72 hours. Thyroid  Function Tests: No results for input(s): TSH, T4TOTAL, FREET4, T3FREE, THYROIDAB in the last 72 hours. Anemia Panel: No results for input(s): VITAMINB12, FOLATE, FERRITIN, TIBC, IRON , RETICCTPCT in the last 72 hours. Urine analysis:    Component Value Date/Time   COLORURINE YELLOW (A) 02/24/2024 1647   APPEARANCEUR CLEAR (A) 02/24/2024 1647   LABSPEC 1.018 02/24/2024 1647   PHURINE 5.0 02/24/2024 1647   GLUCOSEU NEGATIVE 02/24/2024 1647   HGBUR NEGATIVE 02/24/2024 1647   BILIRUBINUR NEGATIVE 02/24/2024 1647   BILIRUBINUR negative 03/17/2020 1534   KETONESUR 80 (A) 02/24/2024 1647   PROTEINUR NEGATIVE 02/24/2024 1647   UROBILINOGEN 0.2 03/17/2020 1534   NITRITE NEGATIVE 02/24/2024 1647   LEUKOCYTESUR NEGATIVE 02/24/2024 1647    Radiological Exams on Admission: No results found.  EKG: Independently reviewed.  Sinus, new onset of LBBB compared to previous EKG showed incomplete LBBB, prolonged QTc 530-550  Assessment/Plan Principal Problem:   Antipsychotic overdose Active Problems:   Drug overdose, intentional, initial  encounter (HCC)  (please populate well all problems here in Problem List. (For example, if patient is on BP meds at home and you resume or decide to hold them, it is a problem that needs to be her. Same for CAD, COPD, HLD and so on)  Acute metabolic encephalopathy Intentional versus unintentional antipsychotic overdose Seroquel  overdose -IVC and 1:1 for safety - Inpatient psychiatry consult - Estimated Seroquel  ingested 7500 mg- 8750 mg. Poison control center consulted several times overnight, who recommended supportive care including aggressive hydration, magnesium , 1 dose of bicarb IV push given overnight.  Repeat EKG however showed persistent complete LBBB.  Patient however has no complaint of chest pains.  Ordered one-time troponin level.  EKG Q 8 hours x 3.  And will consider echocardiogram if LBBB persists. -Seizure precaution -CT head -EEG - Hold off Seroquel   Question of arsenic poisoning - Was reported anonymously by a claimed to be friend of the patient to night ED physician.  Unfortunately, ED physician did not have to time find out name of the person who made a call before he hang up.  I discussed the usual risks wife over the phone, wife told me that she only told one of the family's friend over the phone last night about the overdose issue.  But she could not figure out what have reported the possible arsenic poisoning.  As she described that they live in the area and has no access or exposure to arsenic containing compounds or food/drinks. - Again Poison control was contacted for the issue, heavy metal panel sent.  History of schizophrenia - As above  Chronic narcotic dependence - Resume Suboxone  tomorrow  COPD - Stable, continue current breathing meds regimen  Chronic pain syndrome - Continue amantadine  and gabapentin   Prolonged Qtc - Acute on chronic, management as above  New onset of LBBB - As above.  DVT prophylaxis: Lovenox  Code Status: Full code Family  Communication: Wife over  the phone Disposition Plan: Patient is expressed significant overdose of antipsychotic medications requiring close monitoring and multidiscipline care, expect more than 2 midnight hospital stay. Consults called: Psychiatry Admission status:    Cort ONEIDA Mana MD [facility] Pager [phone]  03/05/2024, 9:23 AM       [1]  Allergies Allergen Reactions   Codeine Nausea And Vomiting   "

## 2024-03-05 NOTE — ED Notes (Signed)
 This tech is 1:1 with patient at this time. Pt repeatedly trying to get out of bed and pull cardiac leads off, but can be redirected. Pt needed to urinate and was able to use urinal with my assistance. Urine sent off to lab. Fall risk bundle is in place.

## 2024-03-05 NOTE — ED Notes (Signed)
 This RN contacted poison control regarding Arsenic testing that family friend was demanding to be done on pt. Poison control did not find any reason why pt should be tested for Arsenic poisoning and did not recommend testing pt. Posing control let us  know that this specific test is mainly done in primary care providers office with weeks of preparation needed to have accurate test. MD Willo notified

## 2024-03-05 NOTE — Progress Notes (Signed)
 SPIRITUAL CARE AND COUNSELING CONSULT NOTE   VISIT SUMMARY Helped wife understand the rules for ED BH unit. Pt's wife agreed to rules  SPIRITUAL ENCOUNTER                                                                                                                                                                      Type of Visit: Initial Care provided to:: Family Conversation partners present during encounter: Nurse Referral source: Other (comment) (Same day surgery secretary) Reason for visit: Routine spiritual support   SPIRITUAL FRAMEWORK      GOALS       INTERVENTIONS   Spiritual Care Interventions Made: Established relationship of care and support, Compassionate presence, Narrative/life review    INTERVENTION OUTCOMES   Outcomes: Connection to spiritual care, Awareness of support  SPIRITUAL CARE PLAN   Spiritual Care Issues Still Outstanding: No further spiritual care needs at this time (see row info)    If immediate needs arise, please contact [facility] 24 hour on call [phone]   Sari Hugh, Chaplain  03/05/2024 3:30 PM

## 2024-03-05 NOTE — ED Provider Notes (Addendum)
 Emergency Medicine Observation Re-evaluation Note   BP 98/68   Pulse 94   Temp 98 F (36.7 C)   Resp 13   SpO2 97%    ED Course / MDM   No reported events during my shift at the time of this note.  Patient resting comfortably with normal repeat vital signs.  Observed until 3 AM per previous provider note, then medical clearance for IVC psychiatric evaluation.   Electrolytes repleted.  Pt is awaiting dispo from consultants  -- Reassessed at 4 AM.  Blood pressure remained stable, breathing comfortably on cardiopulmonary monitoring.  However remains quite somnolent, awakens briefly and moans but does not follow commands or make meaningful words..  Afebrile, no clonus, no evidence of NMS or serotonin syndrome at this time.  Will continue to monitor medically given his somnolence.   Ginnie Shams MD    Shams Ginnie, MD 03/05/24 PARALEE    Shams Ginnie, MD 03/05/24 RAYMONDE    Shams Ginnie, MD 03/05/24 [phone]

## 2024-03-05 NOTE — ED Notes (Signed)
 This NT offered patient his breakfast tray. Patient was too lethargic to eat.
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
## 2024-03-05 NOTE — ED Notes (Signed)
IVC/pending medical admission

## 2024-03-05 NOTE — ED Notes (Signed)
 Socks, bed alarm, fall risk bracelet on. Stretcher locked and in lowest position. Call bell within reach

## 2024-03-05 NOTE — ED Notes (Signed)
 Followed up with poison control, they recommend replacing potassium (goal range is 4) and magnesium  (goal range is 2). MD made aware

## 2024-03-05 NOTE — ED Notes (Signed)
 Pt said I'm gonna shit my pants, so I attempted to help him use the bedpan, but he did not have a BM and proceeded to say, I didn't need to poop I said 2, referring to something he was telling himself. I was able to acquire an EKG at this time.

## 2024-03-06 MED ORDER — LORAZEPAM 2 MG/ML IJ SOLN: 1.0000 mg | Freq: Once | INTRAMUSCULAR | Status: AC

## 2024-03-08 ENCOUNTER — Inpatient Hospital Stay: Payer: MEDICAID | Admitting: Physician Assistant

## 2024-03-08 DIAGNOSIS — I10 Essential (primary) hypertension: Secondary | ICD-10-CM

## 2024-03-08 DIAGNOSIS — E871 Hypo-osmolality and hyponatremia: Secondary | ICD-10-CM

## 2024-03-08 DIAGNOSIS — E782 Mixed hyperlipidemia: Secondary | ICD-10-CM

## 2024-03-08 DIAGNOSIS — F1021 Alcohol dependence, in remission: Secondary | ICD-10-CM

## 2024-03-08 DIAGNOSIS — R4689 Other symptoms and signs involving appearance and behavior: Secondary | ICD-10-CM

## 2024-03-08 DIAGNOSIS — S069X0S Unspecified intracranial injury without loss of consciousness, sequela: Secondary | ICD-10-CM

## 2024-03-08 DIAGNOSIS — K219 Gastro-esophageal reflux disease without esophagitis: Secondary | ICD-10-CM

## 2024-03-08 DIAGNOSIS — Z1159 Encounter for screening for other viral diseases: Secondary | ICD-10-CM

## 2024-03-08 DIAGNOSIS — F200 Paranoid schizophrenia: Secondary | ICD-10-CM

## 2024-03-08 DIAGNOSIS — I700 Atherosclerosis of aorta: Secondary | ICD-10-CM

## 2024-03-08 DIAGNOSIS — R1013 Epigastric pain: Secondary | ICD-10-CM

## 2024-03-08 DIAGNOSIS — K7469 Other cirrhosis of liver: Secondary | ICD-10-CM

## 2024-03-08 DIAGNOSIS — F17210 Nicotine dependence, cigarettes, uncomplicated: Secondary | ICD-10-CM

## 2024-03-08 DIAGNOSIS — D508 Other iron deficiency anemias: Secondary | ICD-10-CM

## 2024-03-08 DIAGNOSIS — D649 Anemia, unspecified: Secondary | ICD-10-CM

## 2024-03-08 DIAGNOSIS — Z23 Encounter for immunization: Secondary | ICD-10-CM

## 2024-03-08 DIAGNOSIS — F1111 Opioid abuse, in remission: Secondary | ICD-10-CM
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
## 2024-03-17 ENCOUNTER — Ambulatory Visit: Payer: MEDICAID | Admitting: Pulmonary Disease

## 2024-03-18 ENCOUNTER — Inpatient Hospital Stay: Payer: MEDICAID | Admitting: Physician Assistant

## 2024-04-05 ENCOUNTER — Other Ambulatory Visit: Payer: MEDICAID

## 2024-04-06 ENCOUNTER — Ambulatory Visit: Payer: MEDICAID | Admitting: Nurse Practitioner

## 2024-04-06 ENCOUNTER — Ambulatory Visit: Payer: MEDICAID

## 2024-04-10 IMAGING — CT CT HEAD W/O CM
4 of 8 series · 17 of 47 positions shown, 18 images · non-contrast
Comparison: 05/17/2021

CLINICAL DATA: Mental status change, unknown cause



[Series 2: head wo · axial · 0.45mm/px · z∈[-131,-36]mm · 4 of 33 slices shown, 5 images]
[im 7/33  brain]
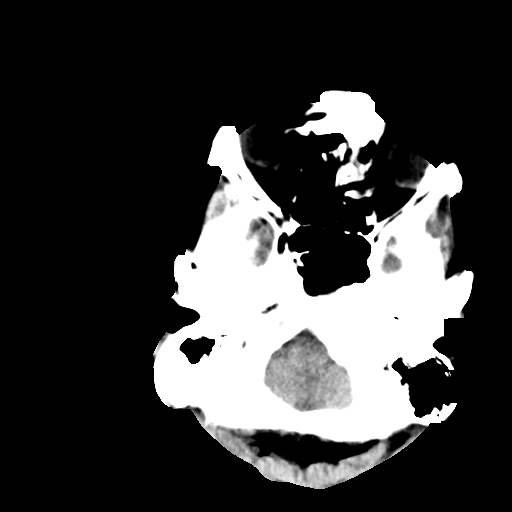
[im 7/33  bone]
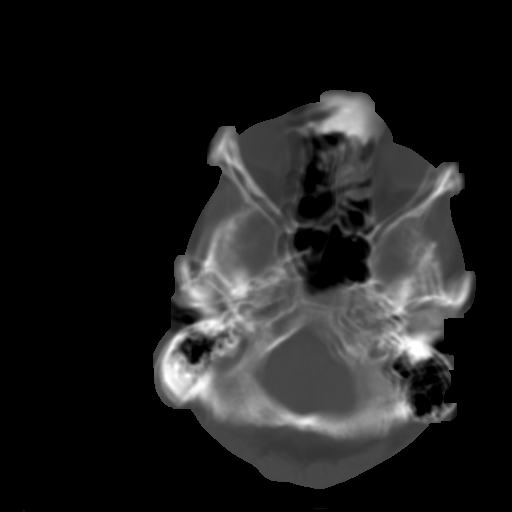
[im 13/33  brain]
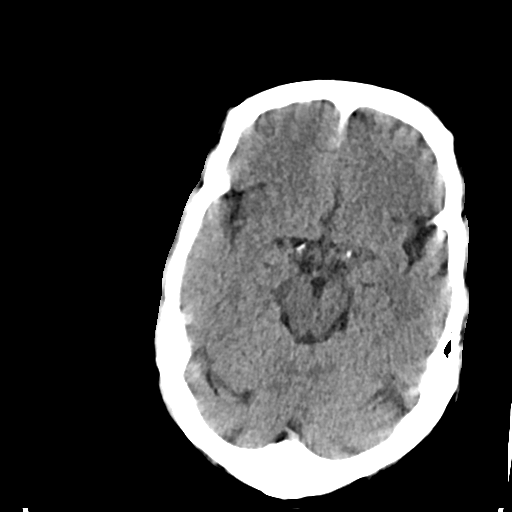
[im 20/33  brain]
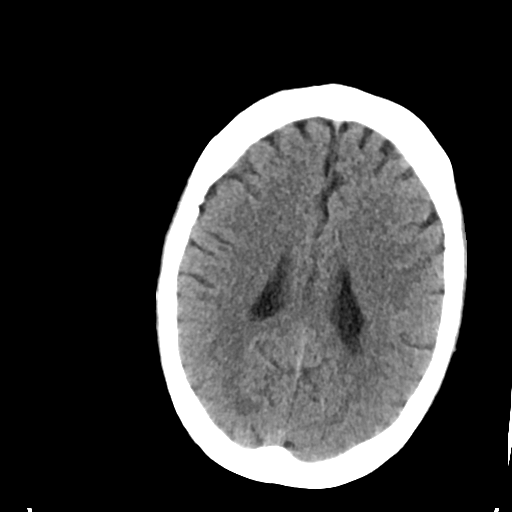
[im 26/33  brain]
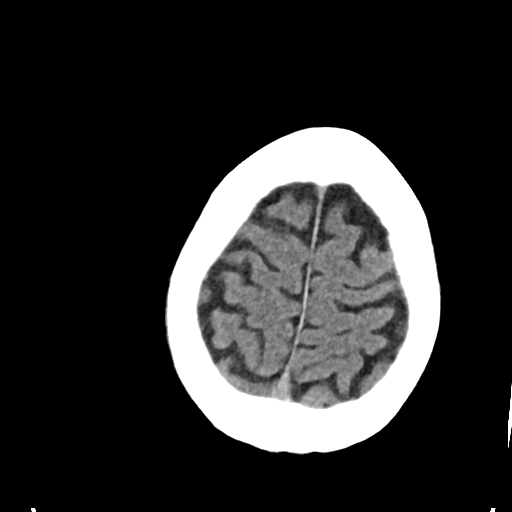

[Series 3: head bone · axial · 0.45mm/px · z∈[-149,-13]mm · 8 of 82 slices shown]
[im 7/82  bone]
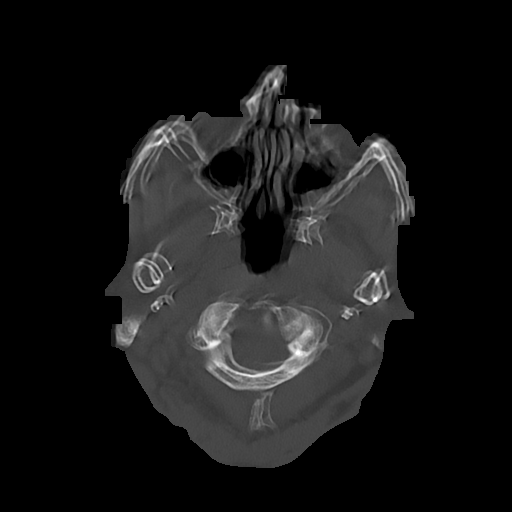
[im 19/82  bone]
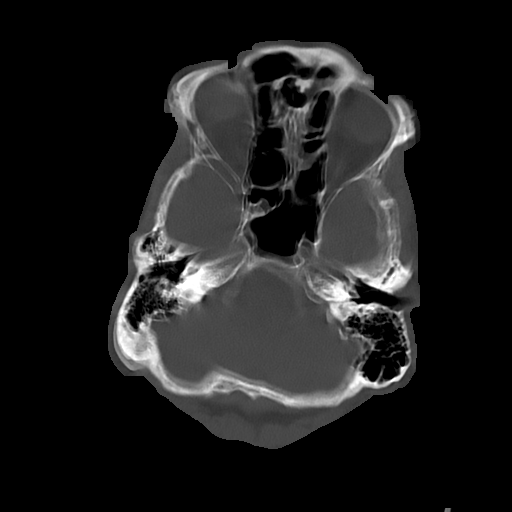
[im 25/82  bone]
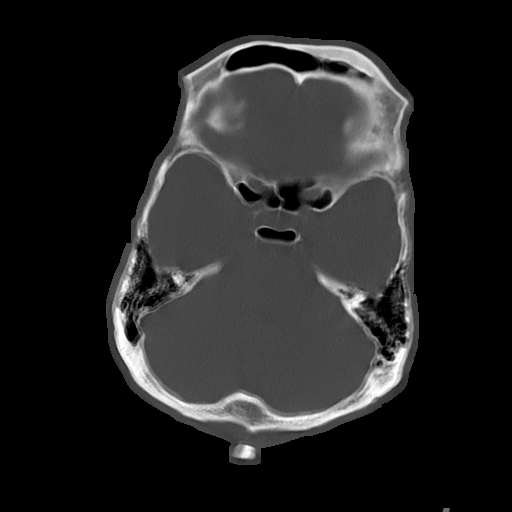
[im 38/82  bone]
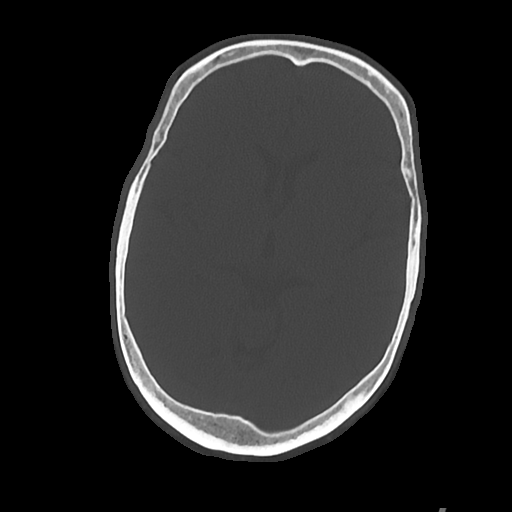
[im 44/82  bone]
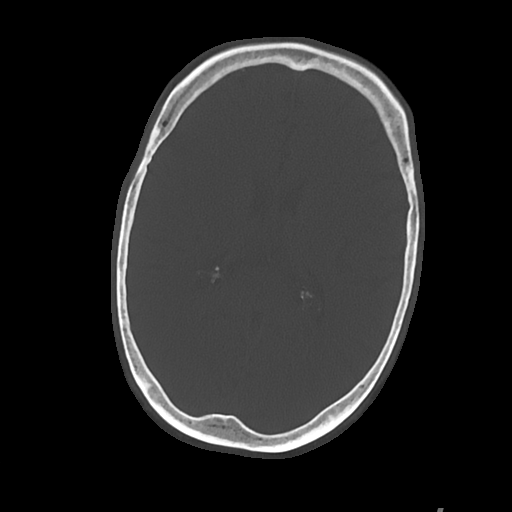
[im 57/82  bone]
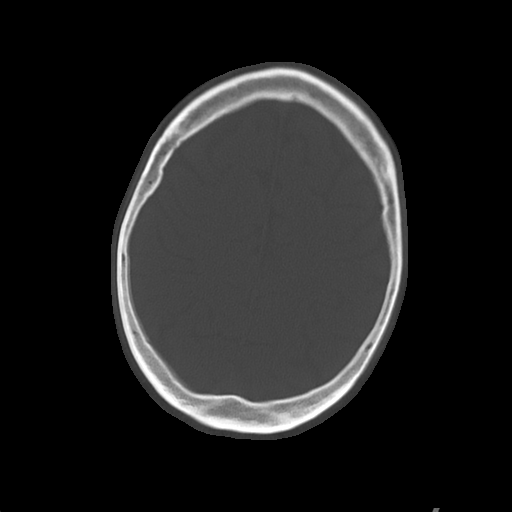
[im 63/82  bone]
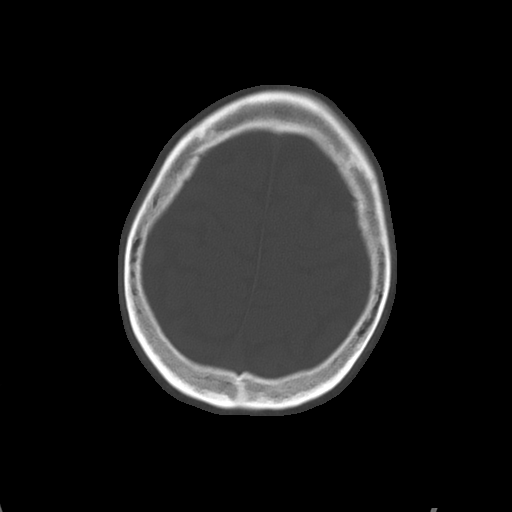
[im 75/82  bone]
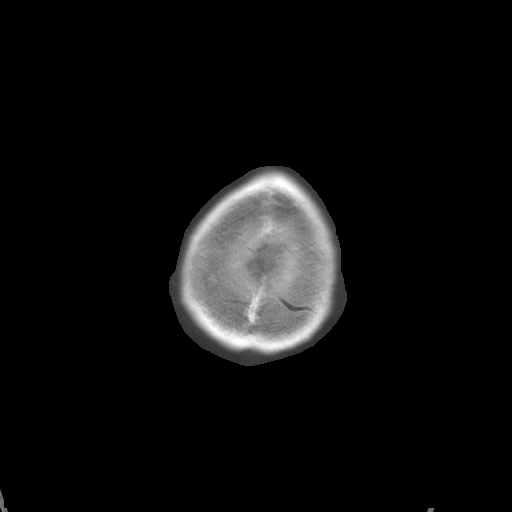

[Series 6: cor soft · coronal · 0.28mm/px · 3 of 66 slices shown]
[im 13/66  brain]
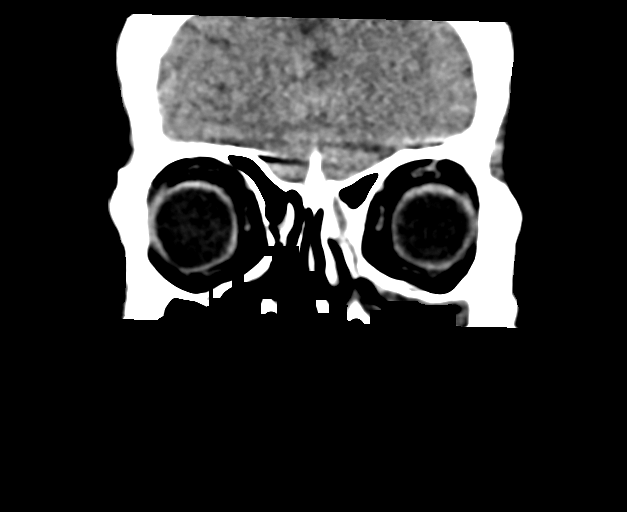
[im 26/66  brain]
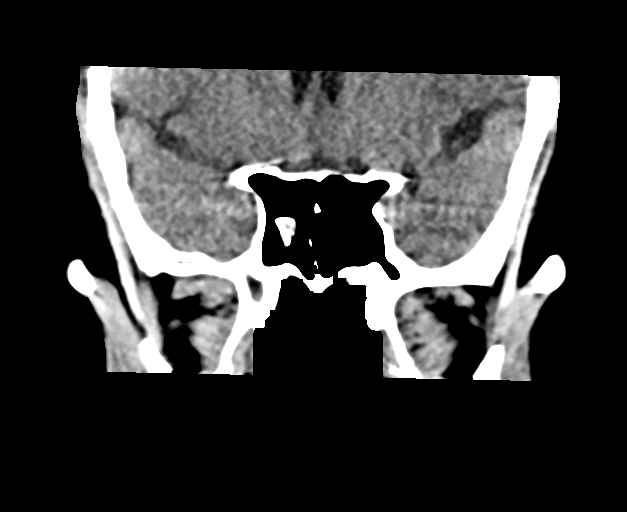
[im 39/66  brain]
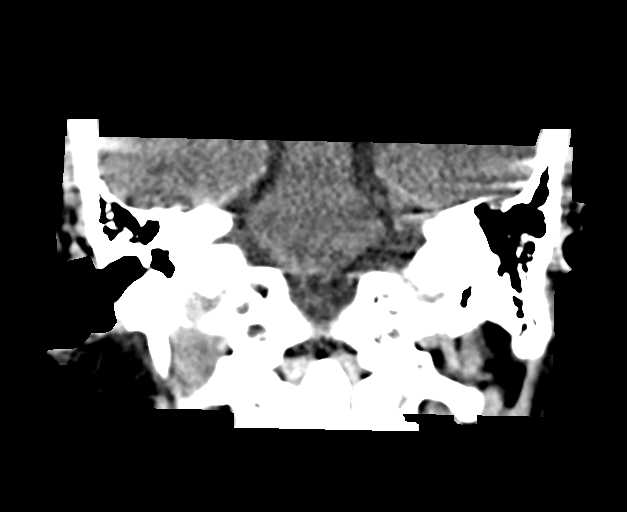

[Series 7: sag soft · sagittal · 0.28mm/px · 2 of 53 slices shown]
[im 18/53  brain]
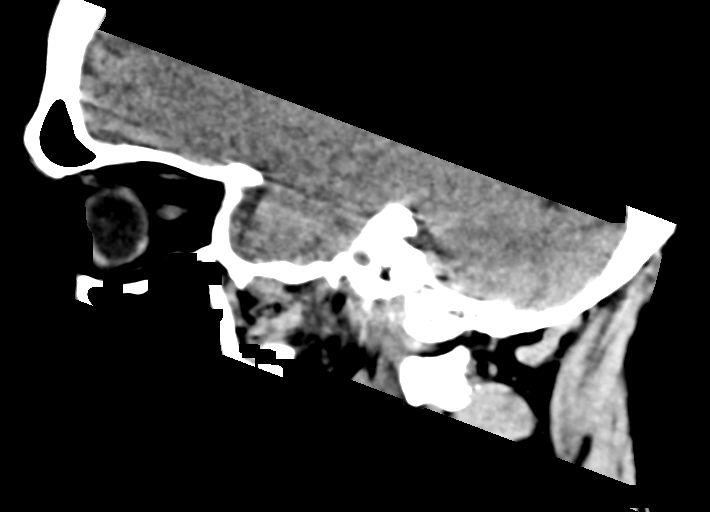
[im 35/53  brain]
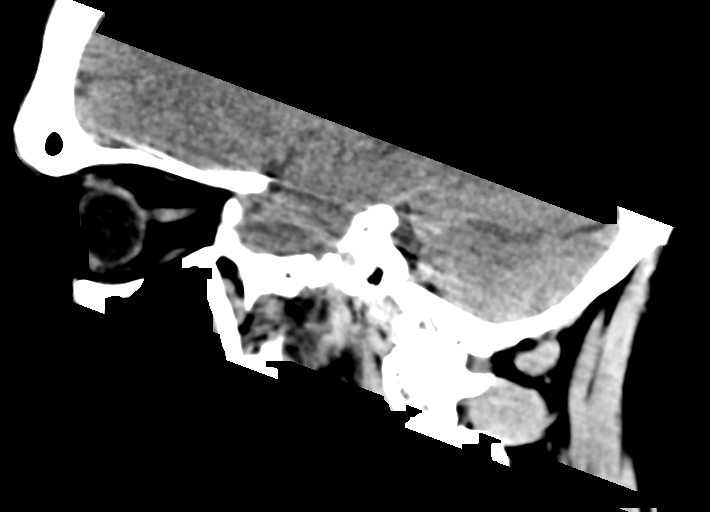

[17 of 47 positions shown; findings below may reference images not displayed]

FINDINGS: Brain: No acute intracranial abnormality. Specifically, no
hemorrhage, hydrocephalus, mass lesion, acute infarction, or
significant intracranial injury.

Vascular: No hyperdense vessel or unexpected calcification.

Skull: No acute calvarial abnormality.

Sinuses/Orbits: Mucosal thickening within the paranasal sinuses.
Air-fluid level in the maxillary sinuses.

Other: None
IMPRESSION: No acute intracranial abnormality.

Acute on chronic sinusitis.

## 2024-04-11 IMAGING — US US EXTREM LOW VENOUS
1 series · 14 of 24 positions shown · non-contrast
Comparison: None Available.

CLINICAL DATA: Leg pain

EXAM:
BILATERAL LOWER EXTREMITY VENOUS DOPPLER ULTRASOUND
TECHNIQUE: Gray-scale sonography with compression, as well as color and duplex
ultrasound, were performed to evaluate the deep venous system(s)
from the level of the common femoral vein through the popliteal and
proximal calf veins.

[Series 1: us venous img lower bilat (dvt) · portal-venous · 14 of 61 slices shown]
[im 1/61]
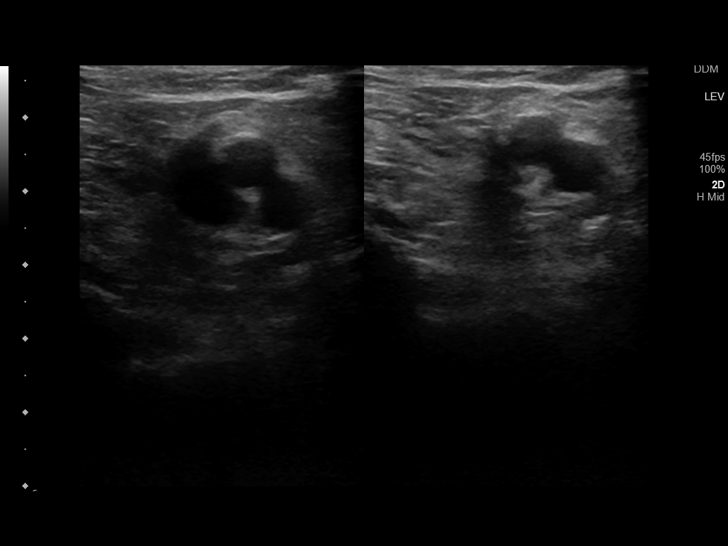
[im 6/61]
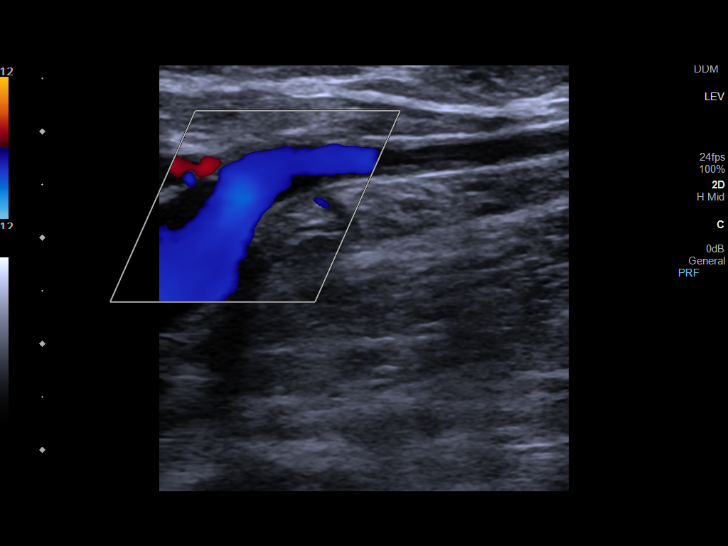
[im 11/61]
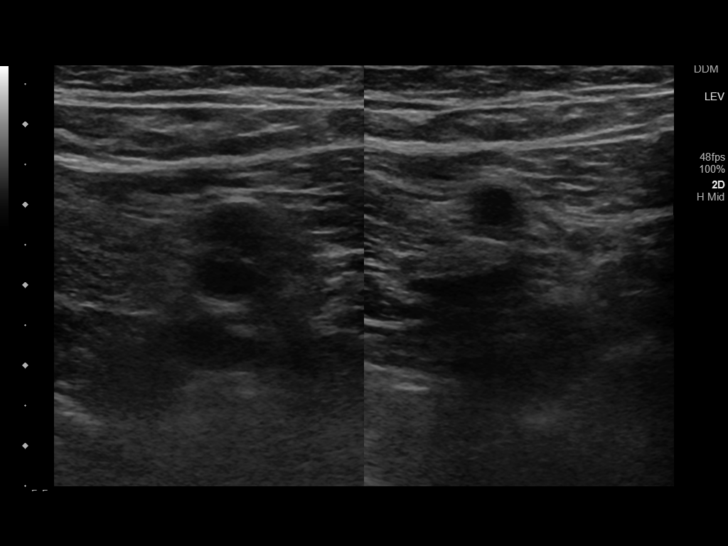
[im 16/61]
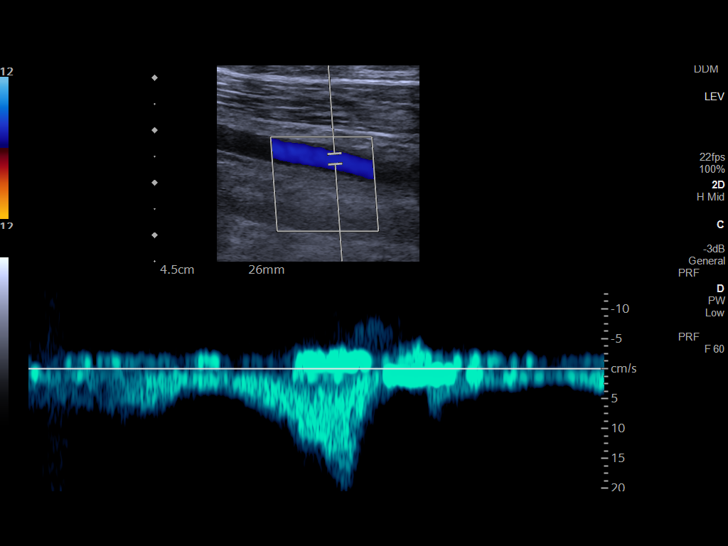
[im 19/61]
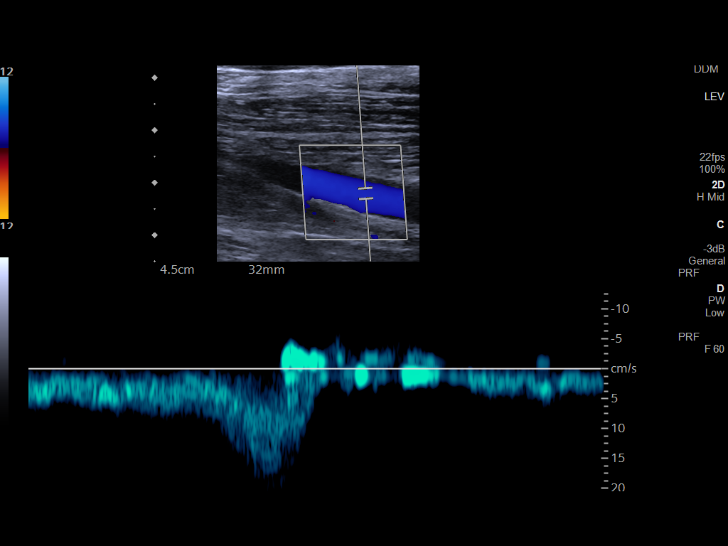
[im 24/61]
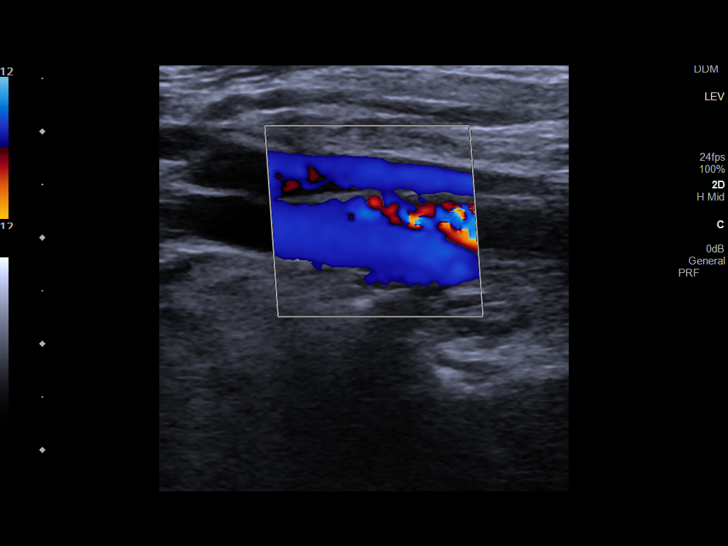
[im 29/61]
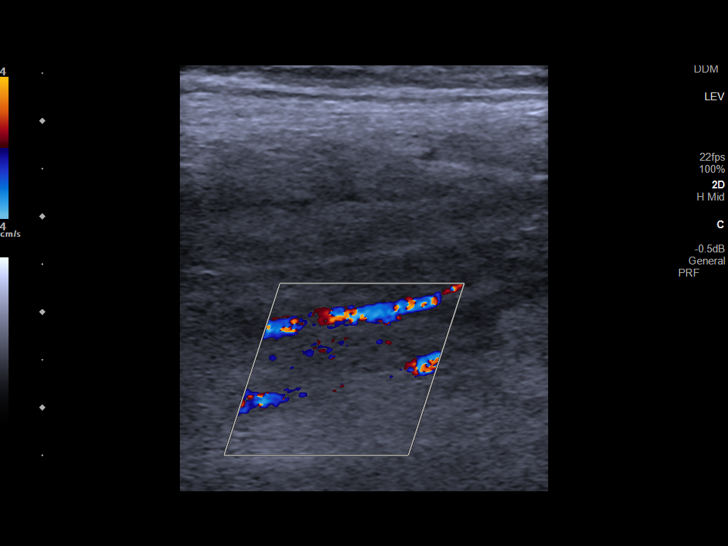
[im 32/61]
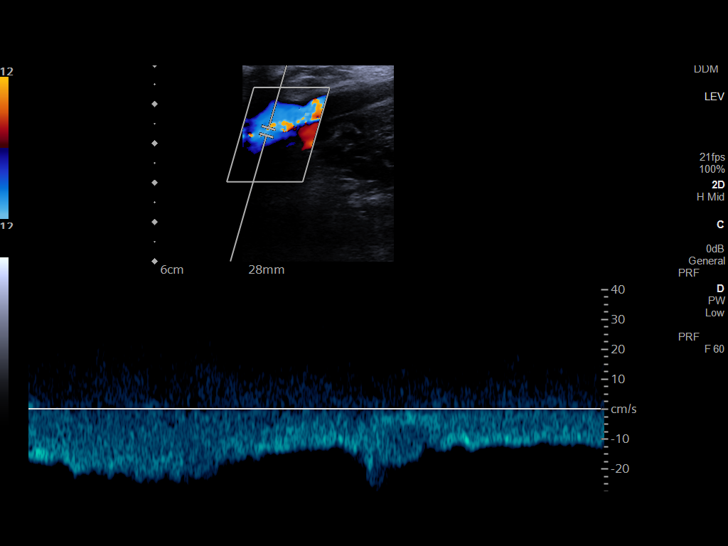
[im 37/61]
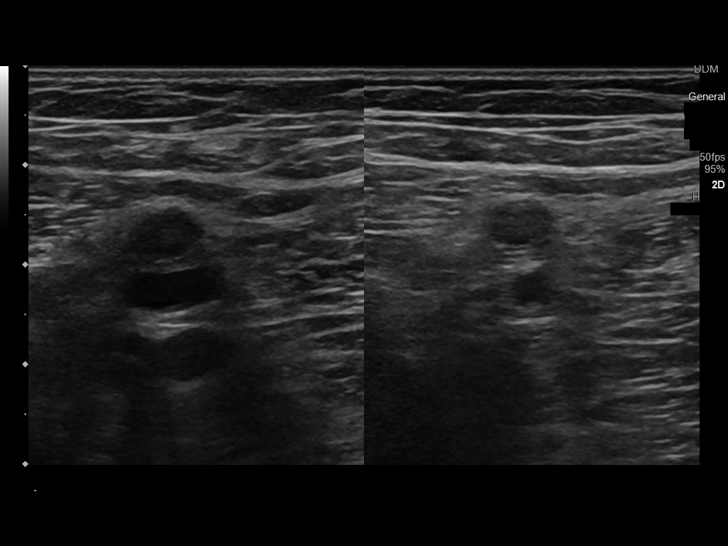
[im 42/61]
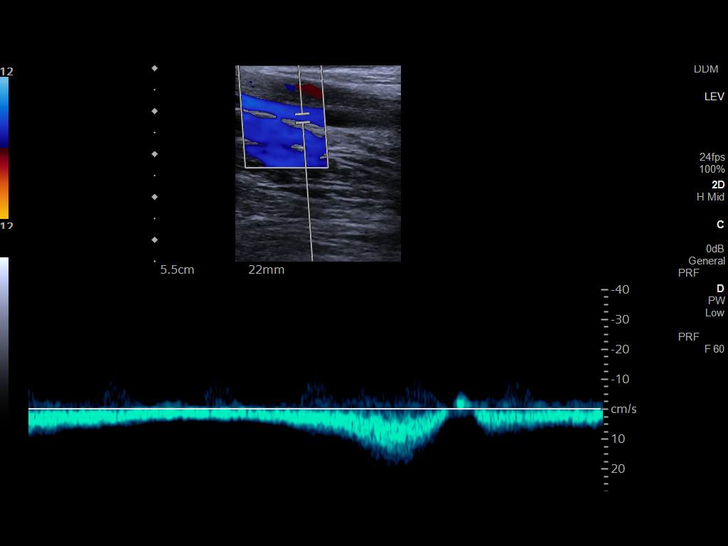
[im 47/61]
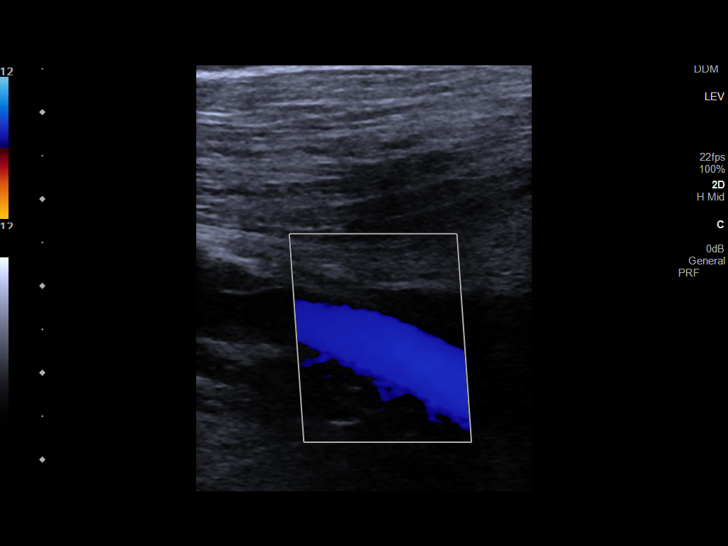
[im 50/61]
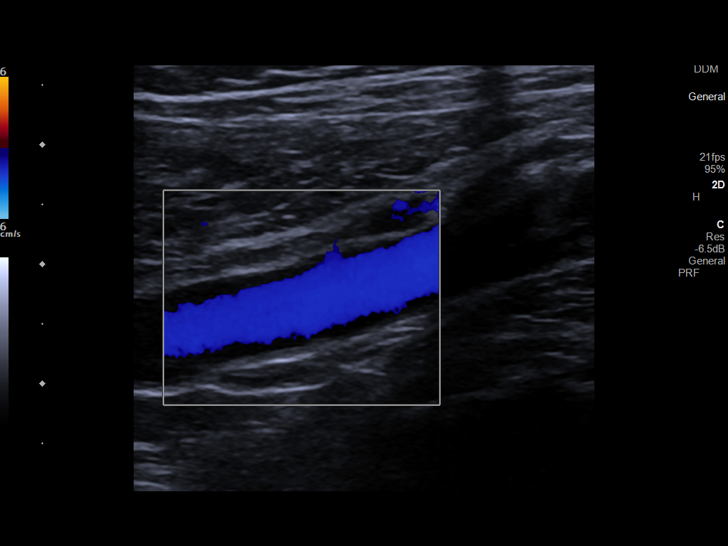
[im 55/61]
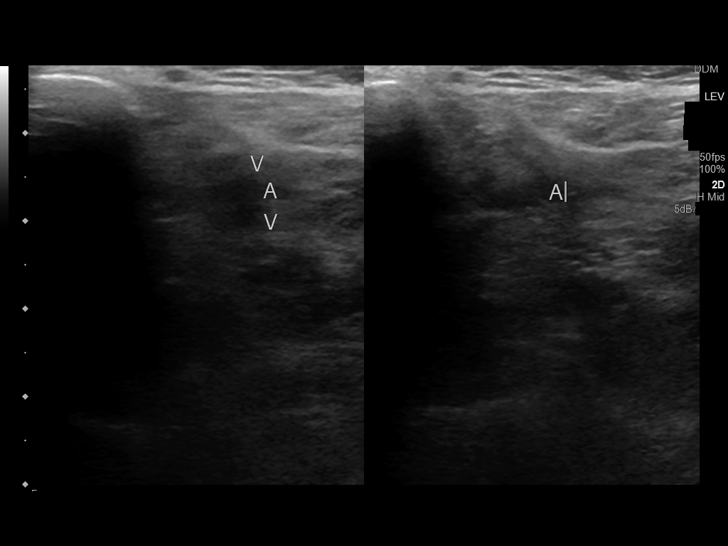
[im 61/61]
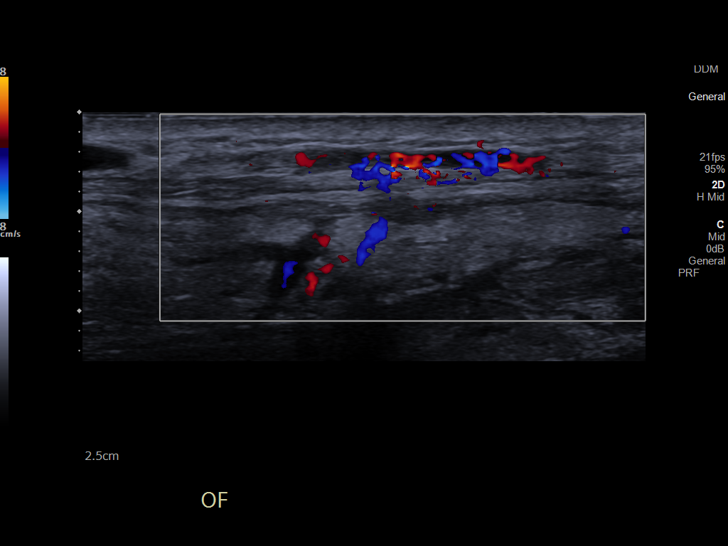

[14 of 24 positions shown; findings below may reference images not displayed]

FINDINGS: VENOUS

Normal compressibility of the common femoral, superficial femoral,
and popliteal veins, as well as the visualized calf veins.
Visualized portions of profunda femoral vein and great saphenous
vein unremarkable. No filling defects to suggest DVT on grayscale or
color Doppler imaging. Doppler waveforms show normal direction of
venous flow, normal respiratory plasticity and response to
augmentation.

OTHER

Area of swelling appears to correspond to varicose veins.

Limitations: none
IMPRESSION: No evidence of lower extremity DVT on either side. Area of swelling
appears to correspond to patent varicose veins.

## 2024-04-12 IMAGING — DX DG CHEST 1V PORT
1 series · 2 of 2 positions shown · non-contrast
Comparison: 05/17/2021

CLINICAL DATA: Fevers

EXAM:
PORTABLE CHEST 1 VIEW

[Series 1: chest ap · 0.14mm/px · 2 of 2 slices shown]
[im 1/2]
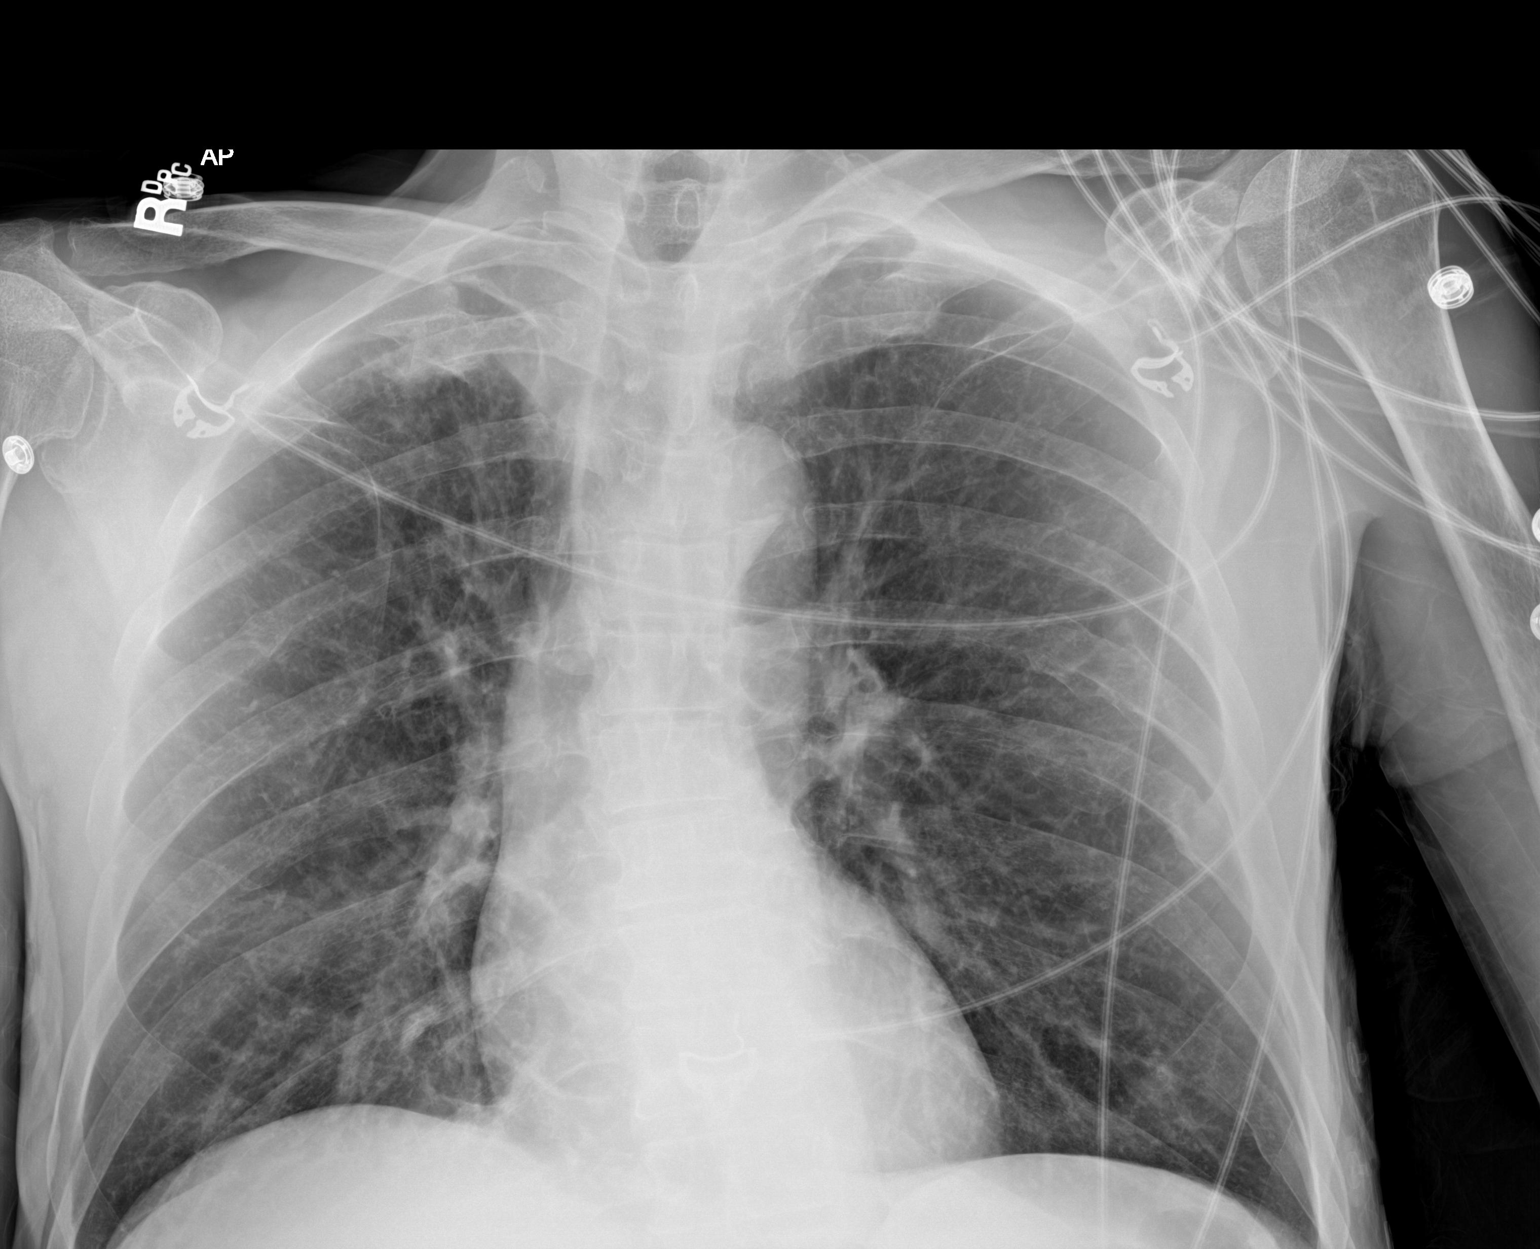
[im 2/2]
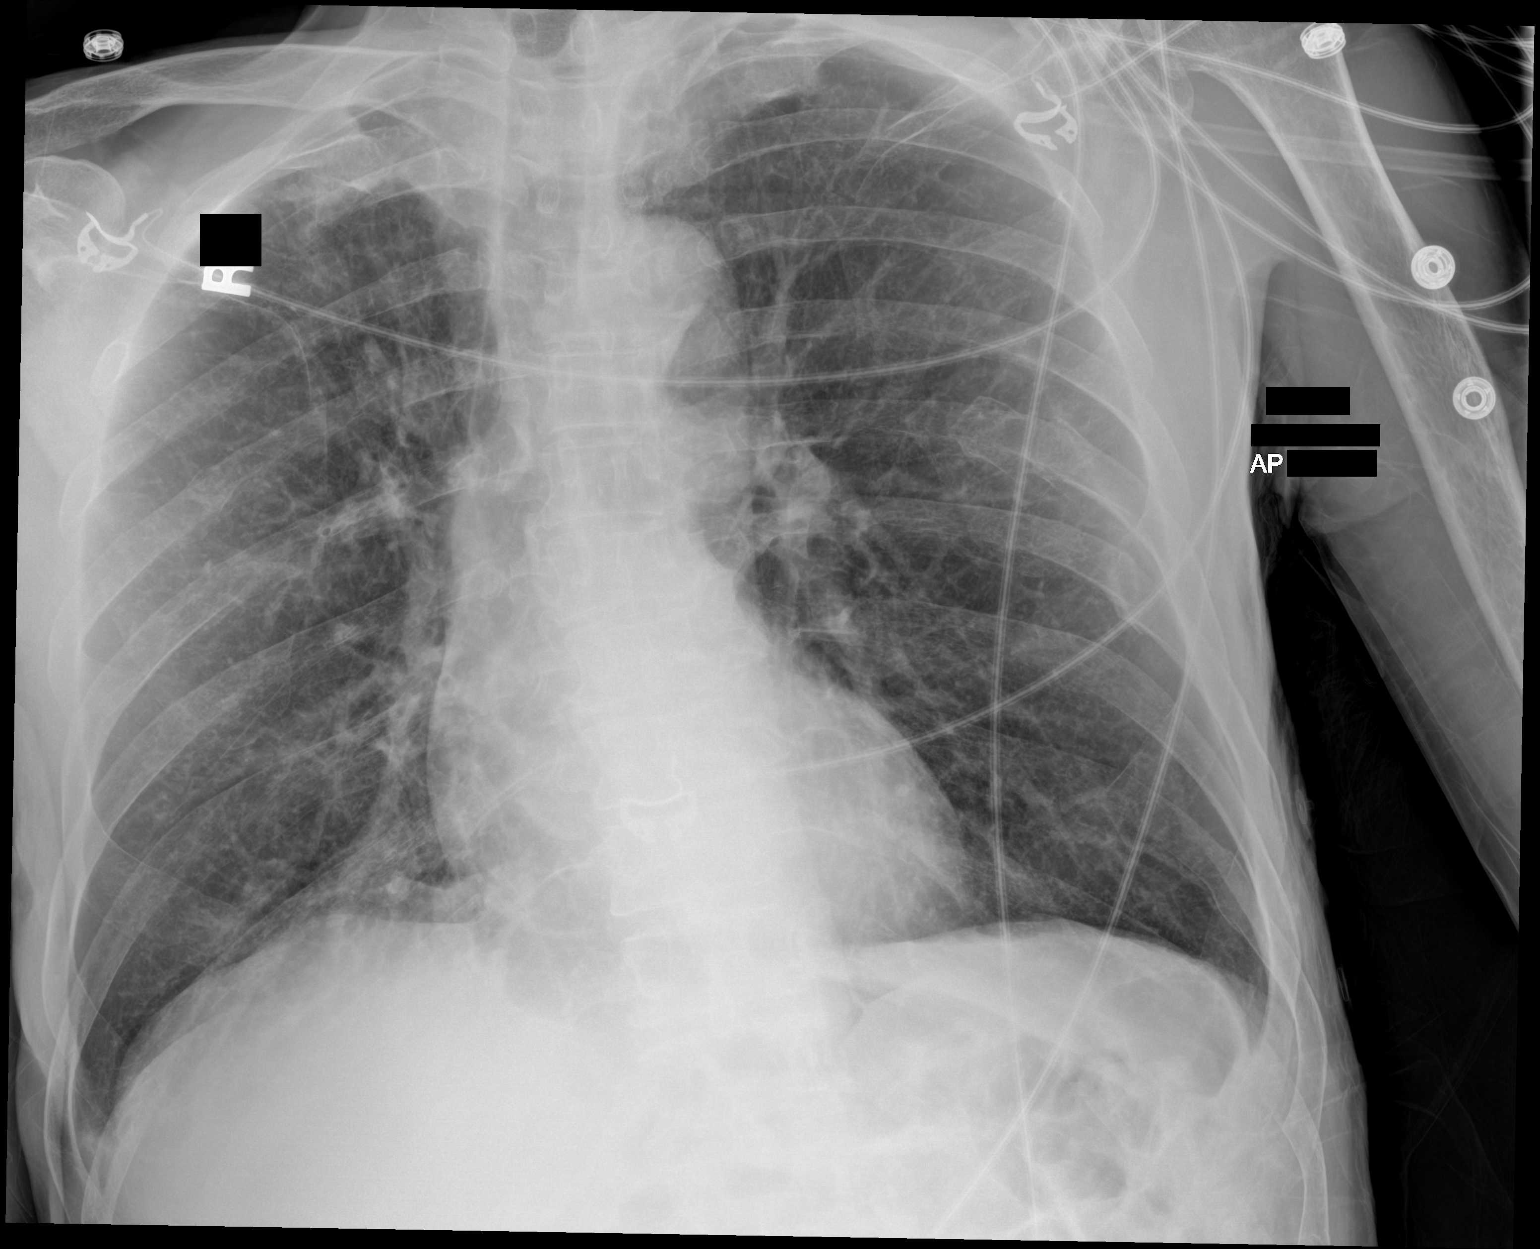

[2 of 2 positions shown; findings below may reference images not displayed]

FINDINGS: Cardiac shadow is within normal limits. The lungs are well aerated
bilaterally. No focal infiltrate or sizable effusion is seen. Apical
pleural and parenchymal scarring is again seen. Old healed rib
fractures on the left are noted.
IMPRESSION: No acute abnormality noted
